# Patient Record
Sex: Male | Born: 1935 | Race: Black or African American | Hispanic: No | Smoking: Current some day smoker
Health system: Southern US, Community
[De-identification: ages and names within clinical notes are randomized; demographics above are authoritative.]

## PROBLEM LIST (undated history)

## (undated) DIAGNOSIS — I739 Peripheral vascular disease, unspecified: Secondary | ICD-10-CM

## (undated) DIAGNOSIS — I749 Embolism and thrombosis of unspecified artery: Secondary | ICD-10-CM

## (undated) DIAGNOSIS — E785 Hyperlipidemia, unspecified: Secondary | ICD-10-CM

## (undated) DIAGNOSIS — M549 Dorsalgia, unspecified: Secondary | ICD-10-CM

## (undated) DIAGNOSIS — M47812 Spondylosis without myelopathy or radiculopathy, cervical region: Secondary | ICD-10-CM

## (undated) DIAGNOSIS — K219 Gastro-esophageal reflux disease without esophagitis: Secondary | ICD-10-CM

## (undated) DIAGNOSIS — M4716 Other spondylosis with myelopathy, lumbar region: Secondary | ICD-10-CM

## (undated) DIAGNOSIS — M4712 Other spondylosis with myelopathy, cervical region: Secondary | ICD-10-CM

## (undated) DIAGNOSIS — M25549 Pain in joints of unspecified hand: Secondary | ICD-10-CM

## (undated) DIAGNOSIS — I10 Essential (primary) hypertension: Secondary | ICD-10-CM

## (undated) DIAGNOSIS — R351 Nocturia: Secondary | ICD-10-CM

## (undated) DIAGNOSIS — R7303 Prediabetes: Secondary | ICD-10-CM

## (undated) DIAGNOSIS — I639 Cerebral infarction, unspecified: Secondary | ICD-10-CM

## (undated) DIAGNOSIS — N400 Enlarged prostate without lower urinary tract symptoms: Secondary | ICD-10-CM

## (undated) DIAGNOSIS — G8929 Other chronic pain: Secondary | ICD-10-CM

## (undated) DIAGNOSIS — J40 Bronchitis, not specified as acute or chronic: Secondary | ICD-10-CM

## (undated) DIAGNOSIS — K5790 Diverticulosis of intestine, part unspecified, without perforation or abscess without bleeding: Secondary | ICD-10-CM

## (undated) DIAGNOSIS — M1990 Unspecified osteoarthritis, unspecified site: Secondary | ICD-10-CM

## (undated) DIAGNOSIS — N529 Male erectile dysfunction, unspecified: Secondary | ICD-10-CM

## (undated) DIAGNOSIS — R3915 Urgency of urination: Secondary | ICD-10-CM

## (undated) DIAGNOSIS — N183 Chronic kidney disease, stage 3 unspecified: Secondary | ICD-10-CM

## (undated) HISTORY — DX: Benign prostatic hyperplasia without lower urinary tract symptoms: N40.0

## (undated) HISTORY — DX: Cerebral infarction, unspecified: I63.9

## (undated) HISTORY — DX: Hyperlipidemia, unspecified: E78.5

## (undated) HISTORY — DX: Essential (primary) hypertension: I10

## (undated) HISTORY — DX: Prediabetes: R73.03

## (undated) HISTORY — DX: Spondylosis without myelopathy or radiculopathy, cervical region: M47.812

## (undated) HISTORY — DX: Other spondylosis with myelopathy, lumbar region: M47.16

## (undated) HISTORY — DX: Unspecified osteoarthritis, unspecified site: M19.90

## (undated) HISTORY — DX: Male erectile dysfunction, unspecified: N52.9

## (undated) HISTORY — DX: Pain in joints of unspecified hand: M25.549

---

## 1981-06-26 HISTORY — PX: SKIN GRAFT: SHX250

## 1997-10-12 ENCOUNTER — Ambulatory Visit (HOSPITAL_COMMUNITY): Admission: RE | Admit: 1997-10-12 | Discharge: 1997-10-12 | Payer: Self-pay | Admitting: Family Medicine

## 1998-01-19 ENCOUNTER — Other Ambulatory Visit: Admission: RE | Admit: 1998-01-19 | Discharge: 1998-01-19 | Payer: Self-pay | Admitting: Family Medicine

## 1998-02-26 ENCOUNTER — Ambulatory Visit (HOSPITAL_COMMUNITY): Admission: RE | Admit: 1998-02-26 | Discharge: 1998-02-26 | Payer: Self-pay | Admitting: Gastroenterology

## 1998-04-05 ENCOUNTER — Encounter: Admission: RE | Admit: 1998-04-05 | Discharge: 1998-04-05 | Payer: Self-pay | Admitting: Family Medicine

## 1998-04-05 ENCOUNTER — Ambulatory Visit (HOSPITAL_COMMUNITY): Admission: RE | Admit: 1998-04-05 | Discharge: 1998-04-05 | Payer: Self-pay | Admitting: Family Medicine

## 1999-03-07 ENCOUNTER — Ambulatory Visit (HOSPITAL_COMMUNITY): Admission: RE | Admit: 1999-03-07 | Discharge: 1999-03-07 | Payer: Self-pay | Admitting: Family Medicine

## 1999-03-07 ENCOUNTER — Encounter: Payer: Self-pay | Admitting: Family Medicine

## 2000-04-09 ENCOUNTER — Ambulatory Visit (HOSPITAL_COMMUNITY): Admission: RE | Admit: 2000-04-09 | Discharge: 2000-04-09 | Payer: Self-pay | Admitting: *Deleted

## 2000-04-27 ENCOUNTER — Ambulatory Visit (HOSPITAL_COMMUNITY): Admission: RE | Admit: 2000-04-27 | Discharge: 2000-04-27 | Payer: Self-pay | Admitting: Family Medicine

## 2000-04-27 ENCOUNTER — Encounter: Payer: Self-pay | Admitting: Family Medicine

## 2001-03-12 ENCOUNTER — Ambulatory Visit (HOSPITAL_COMMUNITY): Admission: RE | Admit: 2001-03-12 | Discharge: 2001-03-12 | Payer: Self-pay | Admitting: Family Medicine

## 2001-03-12 ENCOUNTER — Encounter: Payer: Self-pay | Admitting: Family Medicine

## 2001-07-01 ENCOUNTER — Emergency Department (HOSPITAL_COMMUNITY): Admission: EM | Admit: 2001-07-01 | Discharge: 2001-07-01 | Payer: Self-pay | Admitting: Emergency Medicine

## 2001-07-01 ENCOUNTER — Encounter: Payer: Self-pay | Admitting: Emergency Medicine

## 2002-04-02 ENCOUNTER — Encounter: Payer: Self-pay | Admitting: Internal Medicine

## 2002-04-02 ENCOUNTER — Ambulatory Visit (HOSPITAL_COMMUNITY): Admission: RE | Admit: 2002-04-02 | Discharge: 2002-04-02 | Payer: Self-pay | Admitting: Internal Medicine

## 2002-09-09 ENCOUNTER — Encounter: Payer: Self-pay | Admitting: Family Medicine

## 2002-09-09 ENCOUNTER — Encounter: Admission: RE | Admit: 2002-09-09 | Discharge: 2002-09-09 | Payer: Self-pay | Admitting: Family Medicine

## 2002-09-12 ENCOUNTER — Encounter: Payer: Self-pay | Admitting: Family Medicine

## 2002-09-12 ENCOUNTER — Ambulatory Visit (HOSPITAL_COMMUNITY): Admission: RE | Admit: 2002-09-12 | Discharge: 2002-09-12 | Payer: Self-pay | Admitting: Family Medicine

## 2002-11-19 ENCOUNTER — Ambulatory Visit (HOSPITAL_COMMUNITY): Admission: RE | Admit: 2002-11-19 | Discharge: 2002-11-19 | Payer: Self-pay | Admitting: Specialist

## 2002-11-19 ENCOUNTER — Encounter: Payer: Self-pay | Admitting: Specialist

## 2003-01-03 ENCOUNTER — Emergency Department (HOSPITAL_COMMUNITY): Admission: EM | Admit: 2003-01-03 | Discharge: 2003-01-03 | Payer: Self-pay | Admitting: Emergency Medicine

## 2003-01-03 ENCOUNTER — Encounter: Payer: Self-pay | Admitting: Emergency Medicine

## 2003-01-06 ENCOUNTER — Encounter: Payer: Self-pay | Admitting: Specialist

## 2003-01-06 ENCOUNTER — Encounter: Admission: RE | Admit: 2003-01-06 | Discharge: 2003-01-06 | Payer: Self-pay | Admitting: Specialist

## 2003-01-20 ENCOUNTER — Encounter: Payer: Self-pay | Admitting: Radiology

## 2003-01-20 ENCOUNTER — Encounter: Admission: RE | Admit: 2003-01-20 | Discharge: 2003-01-20 | Payer: Self-pay | Admitting: Specialist

## 2003-01-20 ENCOUNTER — Encounter: Payer: Self-pay | Admitting: Specialist

## 2003-02-04 ENCOUNTER — Encounter: Payer: Self-pay | Admitting: Specialist

## 2003-02-04 ENCOUNTER — Encounter: Admission: RE | Admit: 2003-02-04 | Discharge: 2003-02-04 | Payer: Self-pay | Admitting: Specialist

## 2004-03-09 ENCOUNTER — Inpatient Hospital Stay (HOSPITAL_COMMUNITY): Admission: AD | Admit: 2004-03-09 | Discharge: 2004-03-11 | Payer: Self-pay | Admitting: Infectious Diseases

## 2004-03-14 ENCOUNTER — Ambulatory Visit (HOSPITAL_COMMUNITY): Admission: RE | Admit: 2004-03-14 | Discharge: 2004-03-14 | Payer: Self-pay | Admitting: Gastroenterology

## 2004-03-14 ENCOUNTER — Encounter (INDEPENDENT_AMBULATORY_CARE_PROVIDER_SITE_OTHER): Payer: Self-pay | Admitting: *Deleted

## 2004-03-30 ENCOUNTER — Ambulatory Visit (HOSPITAL_COMMUNITY): Admission: RE | Admit: 2004-03-30 | Discharge: 2004-03-30 | Payer: Self-pay | Admitting: General Surgery

## 2004-03-31 ENCOUNTER — Ambulatory Visit (HOSPITAL_COMMUNITY): Admission: RE | Admit: 2004-03-31 | Discharge: 2004-03-31 | Payer: Self-pay | Admitting: General Surgery

## 2006-01-18 ENCOUNTER — Encounter: Admission: RE | Admit: 2006-01-18 | Discharge: 2006-02-23 | Payer: Self-pay | Admitting: Orthopaedic Surgery

## 2006-02-13 ENCOUNTER — Encounter: Admission: RE | Admit: 2006-02-13 | Discharge: 2006-02-13 | Payer: Self-pay | Admitting: Orthopaedic Surgery

## 2006-04-06 ENCOUNTER — Ambulatory Visit (HOSPITAL_BASED_OUTPATIENT_CLINIC_OR_DEPARTMENT_OTHER): Admission: RE | Admit: 2006-04-06 | Discharge: 2006-04-06 | Payer: Self-pay | Admitting: Orthopaedic Surgery

## 2006-09-23 ENCOUNTER — Emergency Department (HOSPITAL_COMMUNITY): Admission: EM | Admit: 2006-09-23 | Discharge: 2006-09-23 | Payer: Self-pay | Admitting: Emergency Medicine

## 2006-10-11 ENCOUNTER — Encounter: Admission: RE | Admit: 2006-10-11 | Discharge: 2006-10-11 | Payer: Self-pay | Admitting: Gastroenterology

## 2009-12-16 ENCOUNTER — Ambulatory Visit (HOSPITAL_COMMUNITY): Admission: RE | Admit: 2009-12-16 | Discharge: 2009-12-16 | Payer: Self-pay | Admitting: Internal Medicine

## 2009-12-16 ENCOUNTER — Encounter (INDEPENDENT_AMBULATORY_CARE_PROVIDER_SITE_OTHER): Payer: Self-pay | Admitting: Internal Medicine

## 2009-12-16 ENCOUNTER — Ambulatory Visit: Payer: Self-pay | Admitting: Surgery

## 2010-07-27 ENCOUNTER — Emergency Department (HOSPITAL_COMMUNITY): Admission: EM | Admit: 2010-07-27 | Payer: Self-pay | Source: Home / Self Care

## 2010-07-28 LAB — GLUCOSE, CAPILLARY

## 2010-11-11 NOTE — Discharge Summary (Signed)
NAME:  Bobby Vega, Bobby Vega NO.:  [id number]   MEDICAL RECORD NO.:  [id number]                   PATIENT TYPE:  INP   LOCATION:  [id number]                                 FACILITY:  [facility]   PHYSICIAN:  Aquilla Hacker, M.D.              DATE OF BIRTH:  May 17, 1935   DATE OF ADMISSION:  03/09/2004  DATE OF DISCHARGE:  03/11/2004                                 DISCHARGE SUMMARY   DISCHARGE DIAGNOSES:  1.  Abdominal pain.  2.  A 7 x 5 cm soft tissue density mass-like process involving the mid      ascending colon with pericolic stranding seen on computed tomography      scan which was completed at [place].   HISTORY OF PRESENT ILLNESS:  Mr. Look is a 76 year old gentleman who was  seen at [place] approximately 1 week ago for abdominal pain.  The patient  was treated with medication which he cannot remember, sent home, and told to  return for followup a week later.  On September 14, he returned for the  followup at [facility] and was sent to [facility] for CT scan of  the abdomen. The results of the CT scan of the abdomen revealed a 7 x 5 cm  soft tissue density mass-like process which involved the mid ascending colon  with pericolonic stranding and inflammatory changes.  Several diverticula  were also present according to the report faxed from [facility],  and these diverticula were seen in the position of the ascending colon, and  the differential diagnosis included focal diverticulitis with pericolonic  inflammatory changes versus a possible mid ascending colonic neoplasm.  They  recommended barium enema or colonoscopy for further evaluation.  The patient  was admitted into the hospital for further evaluation.  Over the course of  his hospitalization, he remained afebrile with an admitting temperature of  98.1.  Blood wise his white blood cell count remained normal throughout the  course of his  hospitalization.  He came in with an admitting white blood  cell count of 8.0, and today his white blood cell count is 7.6.  Following  his admission into the hospital he was started on IV antibiotics Zosyn 3.375  gm IV q.6h.  His abdominal pain appeared to resolve over the course of his  hospitalization.  Gastroenterology was consulted for further evaluation.  Dr. Benson Norway of gastroenterology stated that if the patient was stable from a GI  standpoint he could have a colonoscopy performed as an outpatient at 7:30  a.m. on March 14, 2004.  Because the patient states he feels better and  the patient is requesting to go home himself, the decision has been made to  discharge the patient home today.  The patient will be sent home with  GoLYTELY to take as preparation for his colonoscopy on Monday, and because  the patient has remained afebrile  over the course of his hospitalization,  his white blood cell count has been normal throughout the course of his  brief hospitalization, the decision has been made by myself not to send him  out with any p.o. antibiotics.                                                Aquilla Hacker, M.D.    OR/MEDQ  D:  03/11/2004  T:  03/11/2004  Job:  [id number]   cc:   Lucianne Lei, M.D.  [phone] [address], [address]  [place]  [place] [address]  Fax: [phone]

## 2010-11-11 NOTE — H&P (Signed)
NAME:  Bobby Vega, Bobby Vega NO.:  [id number]   MEDICAL RECORD NO.:  [id number]                   PATIENT TYPE:  INP   LOCATION:  [id number]                                 FACILITY:  [facility]   PHYSICIAN:  Aquilla Hacker, M.D.              DATE OF BIRTH:  1934/12/18   DATE OF ADMISSION:  03/09/2004  DATE OF DISCHARGE:                                HISTORY & PHYSICAL                                                Aquilla Hacker, M.D.    OR/MEDQ  D:  03/09/2004  T:  03/09/2004  Job:  [id number]

## 2010-11-11 NOTE — Op Note (Signed)
NAME:  Bobby Vega, Bobby Vega NO.:  [id number]   MEDICAL RECORD NO.:  [id number]                   PATIENT TYPE:  AMB   LOCATION:  ENDO                                 FACILITY:  [facility]   PHYSICIAN:  Tory Emerald. Benson Norway, MD                 DATE OF BIRTH:  September 05, 1934   DATE OF PROCEDURE:  03/14/2004  DATE OF DISCHARGE:                                 OPERATIVE REPORT   PROCEDURE:  Colonoscopy.   ENDOSCOPIST:  Tory Emerald. Benson Norway, M.D.   EQUIPMENT USED:  Olympus adult colonoscope.   INDICATIONS FOR PROCEDURE:  Abnormal CT scan revealing a mass in the  ascending colon.   PHYSICAL EXAMINATION:  CARDIOVASCULAR:  Regular rate and rhythm.  LUNGS:  Clear to auscultation bilaterally.  ABDOMEN:  Soft, nontender, nondistended, positive bowel sounds and mildly  obese.   MEDICATIONS:  Versed 7.5 mg IV and Demerol 75 mg IV.   CONSENT:  Informed consent was obtained from the patient describing the  risks of bleeding, infection, perforation, medication reaction, a 10% mis  rate for missing a small colon cancer or polyp and the risk of death, all of  which are not exclusive of any other complications that can occur.   PROCEDURE:  After the patient was placed in the left lateral decubitus  position and after adequate sedation was achieved, the colonoscope was  introduced from the anus into the cecum under direct visualization.  Photo  documentation was obtained of the cecal region.  Upon withdrawal there was  no evidence of any abnormalities in regards to mass or AV malformations in  the cecum.  Upon further withdrawal into the ascending colon, there was a  compressed area with possible mass type lesion.  It was difficult to observe  during this colonoscopy.  There was marked compression of the area and  adequate insufflation was not able to be achieved secondary to this mass  like region.  There was multiple medium sized diverticula noted in this  area.  Multiple biopsies  were taken of this possible mass-like lesion and  there was also a note of an ulceration.  Upon further withdrawal of the  colonoscope, the distal ascending, transverse, descending, sigmoid and  rectum were negative for any masses, ulcerations, erosions or polyps.  There  were notes of scattered diverticula in the descending and sigmoid colon.  On  retroflexion, there was documentation of moderate sized internal and  external hemorrhoids which were nonbleeding.  The preparation was noted to  be excellent.  No complications were encountered.  The patient tolerated the  procedure well.   PLAN:  1.  Follow-up on biopsies.  2.  Further follow-up pending the biopsy results.      PDH/MEDQ  D:  03/14/2004  T:  03/14/2004  Job:  [id number]   cc:   Gwenlyn Perking, M.D.  [address]  [place]  [place] [address]  Fax: [phone]   Jeryl Columbia, M.D.  [address], [place]  [place] [address]  Fax: [phone]

## 2010-11-11 NOTE — H&P (Signed)
NAME:  Bobby Vega, Bobby Vega NO.:  [id number]   MEDICAL RECORD NO.:  [id number]                   PATIENT TYPE:  INP   LOCATION:  5735                                 FACILITY:  [facility]   PHYSICIAN:  Aquilla Hacker, M.D.              DATE OF BIRTH:  05/12/1935   DATE OF ADMISSION:  03/09/2004  DATE OF DISCHARGE:                                HISTORY & PHYSICAL   CHIEF COMPLAINT:  Stomach problems.   HISTORY OF PRESENT ILLNESS:  The patient is a 76 year old male with a past  medical history of hypertension, who was seen at [facility] approximately one  week ago.  At that time, he complained of some abdominal pain.  He states  that he was examined, given some medication that he cannot remember the name  of, sent home, and told to follow up within one week.  He went back to  [facility] today complaining of abdominal pain, again was examined, and this  time referred to [facility] for further evaluation.  He was  later called at home and informed that he had an inflamed cyst on the right  side of his abdomen.  He was also told that he had blood present in his  stool.  Subsequently, Dr. Nelda Marseille called and informed me that there was a  5 x 7 soft tissue mass seen on the CT scan, which was performed today.  She  also stated that the patient was heme-positive and that he was being treated  for Trichomonas.  She requested a direct admission into the hospital here at  [facility].   Currently, the patient denies having any current abdominal pain.  He does  complain of occasional nausea.  No emesis.  No fever or chills.  No weight  changes.  No bright red blood per rectum.  No aggravating factors for his  abdominal pain, nor any alleviating factors for his abdominal pain.  His  abdominal pain is difficult to characterize.  The patient's primary care  physician is Dr. Lucianne Lei.   PAST MEDICAL HISTORY:  1.  Hypertension.  2.  Bronchitis many years  ago.  3.  The patient states that approximately 6 years ago, he was told that he      had blood present in the right side of his abdomen.  He is not very      clear about any details regarding this.   PAST SURGICAL HISTORY:  Repair of left foot bone.   ALLERGIES:  The patient states that he developed a rash to a medication in  the past but cannot recall the name of the medication.   HOME MEDICATIONS:  Cozaar 50 mg one tablet p.o. daily.   SOCIAL HISTORY:  Positive cigarettes.  Stopped approximately 6 to 7 years  ago.  Smoked approximately 3 to 4 packs per day prior to his cessation of  cigarette smoking.  Smoked for  10 to 15 years previously.  Stopped alcohol  10 years ago.  Drank approximately half a pint of alcohol per day for  approximately 10 to 20 years prior to stopping.   FAMILY HISTORY:  Mother had asthma.  Father deceased; cause of his death is  unknown.   REVIEW OF SYSTEMS:  Positive for occasional diarrhea.  No shortness of  breath.  All other symptoms as per HPI; otherwise all negative.   PHYSICAL EXAMINATION:  GENERAL:  No obvious distress.  VITAL SIGNS:  Blood pressure 147/91.  Heart rate 89.  Temperature 98.1.  SPO2 98% on room air.  HEENT:  Anicteric.  Extraocular movements intact.  NECK:  Supple.  No lymphadenopathy.  Thyroid not palpable.  CARDIAC:  S1, S2 present.  Regular rate and rhythm.  No S3 or S4.  PMI not  palpable.  No parasternal heave.  RESPIRATORY:  Lungs clear bilaterally.  No crackles.  No wheezes.  ABDOMEN:  Scaphoid.  Positive diffuse tenderness to palpation.  No guarding.  No rebound.  Good bowel sounds in all four quadrants.  Belly is soft.  No  hepatomegaly.  No splenomegaly.  EXTREMITIES:  No leg edema.  MUSCULOSKELETAL:  Strength 5/5 in upper and lower extremities.  NEURO:  Biceps and knee reflexes 2+.  The patient is alert and oriented to  person and place.  He is not clear on the month.   ASSESSMENT AND PLAN:  1.  Abdominal pain with  soft tissue mass seen on CT scan completed at      [facility] earlier today.  Etiology of mass is unknown.      Will review CT scan with radiologist.  Will consult GI and/or general      surgery after review of films.  The patient is currently afebrile and      does not currently complain of abdominal pain; however, will order blood      cultures times two.  Will order basic metabolic profile to look at all      general labs.  Will consider additional treatment after reviewing films      with radiologist.  2.  Hypertension.  Will continue the patient's home medication of Cozaar 50      mg daily.  3.  GI prophylaxis.  Will provide Protonix 40 mg daily.  4.  DVT prophylaxis.  Will start heparin 5,000 units subcutaneously q. 12h.                                                Aquilla Hacker, M.D.    OR/MEDQ  D:  03/09/2004  T:  03/09/2004  Job:  [id number]

## 2010-11-11 NOTE — Op Note (Signed)
NAME:  Bobby Vega, Bobby Vega NO.:  [id number]   MEDICAL RECORD NO.:  [id number]          PATIENT TYPE:  AMB   LOCATION:  [place]                          FACILITY:  [facility]   PHYSICIAN:  Rennis Harding, M.D.        DATE OF BIRTH:  Dec 30, 1934   DATE OF PROCEDURE:  04/06/2006  DATE OF DISCHARGE:  04/06/2006                                 OPERATIVE REPORT   DIAGNOSIS:  Lumbar spondylosis and back pain.   PROCEDURE:  1. Bilateral L4-5 and L5-S1 facette joint injections.  2. Fluoroscopic imaging used for needle placement of the above injections.   SURGEON:  Rennis Harding, MD   ASSISTANT:  None.   ANESTHESIA:  MAC plus local.   COMPLICATIONS:  None.   ESTIMATED BLOOD LOSS:  None.   INDICATIONS:  The patient is a pleasant 76 year old male with chronic  persistent back pain thought to be secondary to lumbar spondylosis.  He has  failed other treatment modalities now elects to undergo bilateral facette  injections at L4-5, L5-S1 in hopes of improving his symptoms.  Risks,  benefits, alternatives were reviewed.   PROCEDURE:  The patient was taken to the operating room after informed  consent.  He was turned prone.  All bony prominences were padded.  He  underwent sedation.  The back was prepped, draped in usual sterile fashion.  Fluoroscopy brought into the field and the facette joints at L4-5 and L5-S1  were visualized.  22 gauge Quincke spinal needles were then advanced into  the facette joints at L4-5 and L5-S1 bilaterally.  A small amount of  Omnipaque was injected confirming intra-articular spread.  Aspiration showed  no blood or CSF.  We then injected a solution of 40 mg Depo-Medrol 1 mL  preservative-free 1% lidocaine into each facette joint.  The patient was  turned supine, transferred to recovery in stable condition able to move his  upper and lower extremities.  He was neurologically intact.  I reviewed post  injection instructions with him.  He will follow-up in 2  weeks.      Rennis Harding, M.D.  Electronically Signed     MC/MEDQ  D:  04/06/2006  T:  04/09/2006  Job:  [id number]

## 2010-11-11 NOTE — Consult Note (Signed)
NAME:  Bobby Vega, Bobby Vega NO.:  [id number]   MEDICAL RECORD NO.:  [id number]                   PATIENT TYPE:  INP   LOCATION:  5735                                 FACILITY:  [facility]   PHYSICIAN:  Tory Emerald. Benson Norway, MD                 DATE OF BIRTH:  11/06/1934   DATE OF CONSULTATION:  03/11/2004  DATE OF DISCHARGE:  03/11/2004                                   CONSULTATION   ATTENDING PHYSICIAN:  Aquilla Hacker, M.D.   PRIMARY CARE PHYSICIAN:  Dr. Rosana Hoes Cloward   REASON FOR CONSULTATION:  Mass noted on abdominal CT scan.   HISTORY OF PRESENT ILLNESS:  This is a 76 year old black male with a past  medical history of hypertension and bronchitis who was admitted for an  abnormal CT scan and abdominal pain.  The patient initially complained of  intermittent abdominal pain which was right-sided in location and described  as being a sharp and dull aching that did not have any modifying or  alleviating factors.  The patient presented to an outpatient clinic for  evaluation and upon routine evaluation with laboratory values the patient  was not discerned to have any significant blood abnormalities.  However,  upon further complaints of abdominal pain, a CT scan of the abdomen was  performed and it was noted that he had a 5 x 7 soft tissue mass in his  ascending colon which correlated with the site of abdominal pain.  The  patient was then subsequently referred to [facility] for admission  and further evaluation.  The patient states that he has been doing well  aside from the abdominal pain.  He denies having any abdominal pain in the  years past.  However, he may have had some intermittent discomfort within  the past couple of months.  He denies any constipation or diarrhea.  He does  not note any kind of melena.  However, he did have hematochezia  approximately two to three years prior, but no recent episodes.  Additionally, the patient was determined  to have heme-positive stools on a  prior examination.  Otherwise, the patient states that he is doing well.  No  evidence of any dysphagia, odynophagia, nausea, vomiting, or weight loss.  There is no history of any colon cancer.   PAST MEDICAL HISTORY:  As stated above with the addition of bronchitis.   PAST SURGICAL HISTORY:  Repair of left foot bone.   ALLERGIES:  Uncertain of a medication which had caused him to have a rash.   HOME MEDICATIONS:  Cozaar 50 mg p.o. daily.   SOCIAL HISTORY:  Significant for intermittent heavy tobacco abuse and prior  heavy alcohol use approximately 10 years ago.  No history of any illicit  drug use.   FAMILY HISTORY:  Mother with asthma and there were multiple members in the  family with stroke.   REVIEW  OF SYSTEMS:  HEENT:  The patient denies any headaches, blurry vision,  or any eye pain or sinus problems.  NECK:  No complaints of any neck type of  pain.  CHEST:  No complaints of shortness of breath, wheezing, or cough.  CARDIAC:  No complaints of any chest pain or PND or orthopnea.  ABDOMEN:  As  stated above with positive findings of abdominal pain.  GENITOURINARY:  No  evidence of any dysuria or hematuria.  MUSCULOSKELETAL:  No complaints of  any muscle pains or aches or arthritis.  NEUROLOGIC:  Patient denies any  history of any seizures or gross abnormalities in regards to his neurologic  system.   PHYSICAL EXAMINATION:  VITAL SIGNS:  Blood pressure 145/91, heart rate 75,  respirations 20, temperature 98.5, pulse ox on room air 96%.  GENERAL:  The patient is in no acute distress.  Alert and oriented.  HEENT:  Normocephalic, atraumatic.  Extraocular muscles intact.  Pupils are  equal, round, and reactive to light.  NECK:  Supple.  No lymphadenopathy.  LUNGS:  Clear to auscultation bilaterally.  CARDIOVASCULAR:  Regular rate and rhythm without murmurs, rubs, or gallops.  ABDOMEN:  Soft, mildly obese, nondistended.  There is tenderness in  the  right upper and lower quadrants.  There is a palpable mass within that area  and there is pain that is elicited with the palpation of the mass.  Bowel  sounds are present.  RECTAL:  Negative for any masses on the prostate or nodular abnormality.  There is no stool in the rectal vault and there is a history of guaiac  positivity.  EXTREMITIES:  No clubbing, cyanosis, edema.  NEUROLOGIC:  Patient is grossly intact.  SKIN:  No evidence of any rashes.   LABORATORIES:  On March 11, 2004 white blood cell count is 7.6,  hemoglobin 13.4, platelets 222.  Sodium 137, potassium 4.2, chloride 105,  CO2 24, BUN 15, creatinine 1.5, glucose 92.  On March 09, 2004 AST is  16, ALT is 13, alkaline phosphatase is 124, total bilirubin is 0.4, albumin  3.4.  Blood cultures were obtained and are pending at this time.  On  March 11, 2004 PT is 14.4, INR of 1.2, and PTT is 89.   Imaging:  No official radiology report is available.  However, upon review  of the CT scan there is evidence of a large ascending colon mass.   IMPRESSION:  1.  Ascending colon mass with resultant abdominal pain.  2.  Hypertension.   Given the findings of the CT scan, it is likely that patient does have a  malignant process within the ascending colon.  He is relatively asymptomatic  aside from his abdominal pain.  No laboratory values are revealing for any  evidence of significant anemia or iron deficiency.  The patient is currently  stable and nontoxic appearing at this time.   PLAN:  1.  Perform a colonoscopy on March 14, 2004.  2.  Prepare with GoLYTELY 46 L until the diarrhea is clear to yellowish in      color.  3.  n.p.o. after midnight on March 13, 2004.  4.  Clear liquids on March 13, 2004.      PDH/MEDQ  D:  03/11/2004  T:  03/12/2004  Job:  [id number]   cc:   Gwenlyn Perking, M.D.  [address]  [place]  [place] [address]  Fax: [phone]   Aquilla Hacker, M.D.

## 2011-05-16 ENCOUNTER — Other Ambulatory Visit: Payer: Self-pay

## 2011-05-16 DIAGNOSIS — I70219 Atherosclerosis of native arteries of extremities with intermittent claudication, unspecified extremity: Secondary | ICD-10-CM

## 2011-05-26 ENCOUNTER — Encounter: Payer: Self-pay | Admitting: Surgery

## 2011-06-23 ENCOUNTER — Encounter: Payer: Self-pay | Admitting: Surgery

## 2011-06-26 ENCOUNTER — Ambulatory Visit (INDEPENDENT_AMBULATORY_CARE_PROVIDER_SITE_OTHER): Payer: PRIVATE HEALTH INSURANCE | Admitting: Surgery

## 2011-06-26 ENCOUNTER — Encounter: Payer: Self-pay | Admitting: Surgery

## 2011-06-26 ENCOUNTER — Other Ambulatory Visit (INDEPENDENT_AMBULATORY_CARE_PROVIDER_SITE_OTHER): Payer: PRIVATE HEALTH INSURANCE | Admitting: *Deleted

## 2011-06-26 VITALS — BP 188/101 | HR 63 | Resp 16 | Ht 66.0 in | Wt 189.0 lb

## 2011-06-26 DIAGNOSIS — I739 Peripheral vascular disease, unspecified: Secondary | ICD-10-CM

## 2011-06-26 NOTE — Progress Notes (Signed)
Vascular and Vein Specialist of [facility]   Patient name: Bobby Vega MRN: [id number] DOB: Feb 01, 1935 Sex: male   Referred by: Dr. Jeanie Cooks  Reason for referral:  Chief Complaint  Patient presents with  . PVD    Ref. Dr. Jeanie Cooks    HISTORY OF PRESENT ILLNESS: I think patient today in consultation for evaluation of peripheral vascular disease. The patient states that he has been having problems for the past 2 years but they have gotten significantly worse. His biggest complaint is that of the right leg. He states that he can walk approximately 20 feet before he goes dead. He also complains of waking up 5 times at night. He has to patent his leg and hang it over this as of the bed for C. difficile healing back and it. The patient denies any openings, active ulceration or nonhealing wound.  The patient suffers from hypertension and hyperlipidemia both of which are medically managed. He also has borderline diabetes. He is a nonsmoker.     Past Medical History  Diagnosis Date  . Hypertension   . Hyperlipidemia   . BPH (benign prostatic hyperplasia)   . ED (erectile dysfunction)   . Constipation   . Arthritis     hip  . Borderline diabetes   . Pain in joint, hand   . Lumbar spondylosis with myelopathy   . Cervical spondylosis without myelopathy     History reviewed. No pertinent past surgical history.  History   Social History  . Marital Status: Widowed    Spouse Name: N/A    Number of Children: N/A  . Years of Education: N/A   Occupational History  . Not on file.   Social History Main Topics  . Smoking status: Never Smoker   . Smokeless tobacco: Not on file  . Alcohol Use: Not on file  . Drug Use:   . Sexually Active:    Other Topics Concern  . Not on file   Social History Narrative  . No narrative on file    History reviewed. No pertinent family history.  Allergies as of 06/26/2011  . (No Known Allergies)    Current Outpatient Prescriptions on File  Prior to Visit  Medication Sig Dispense Refill  . albuterol (PROVENTIL HFA;VENTOLIN HFA) 108 (90 BASE) MCG/ACT inhaler Inhale 2 puffs into the lungs 4 (four) times daily as needed.        Marland Kitchen aspirin EC 81 MG tablet Take 81 mg by mouth daily.        Marland Kitchen dutasteride (AVODART) 0.5 MG capsule Take 0.5 mg by mouth daily.        Marland Kitchen esomeprazole (NEXIUM) 40 MG capsule Take 40 mg by mouth daily before breakfast.        . loratadine (CLARITIN) 10 MG tablet Take 10 mg by mouth daily.        Marland Kitchen losartan (COZAAR) 100 MG tablet Take 50 mg by mouth daily.        . pregabalin (LYRICA) 75 MG capsule Take 75 mg by mouth 3 (three) times daily.        . sildenafil (VIAGRA) 100 MG tablet Take 100 mg by mouth daily as needed.        . simvastatin (ZOCOR) 20 MG tablet Take 20 mg by mouth at bedtime.        . tadalafil (CIALIS) 20 MG tablet Take 20 mg by mouth daily as needed.        . vitamin B-12 (CYANOCOBALAMIN) 250 MCG tablet  Take 250 mcg by mouth daily.           REVIEW OF SYSTEMS: Cardiovascular: No chest pain, chest pressure, palpitations, orthopnea, or dyspnea on exertion. No claudication or rest pain,  No history of DVT or phlebitis. Pulmonary: No productive cough, asthma or wheezing. Neurologic: No weakness, paresthesias, aphasia, or amaurosis. No dizziness. Hematologic: No bleeding problems or clotting disorders. Musculoskeletal: No joint pain or joint swelling. Gastrointestinal: No blood in stool or hematemesis Genitourinary: No dysuria or hematuria. Psychiatric:: No history of major depression. Integumentary: No rashes or ulcers. Constitutional: No fever or chills.  PHYSICAL EXAMINATION: General: The patient appears their stated age.  Vital signs are BP 188/101  Pulse 63  Resp 16  Ht 5\' 6"  (1.676 m)  Wt 189 lb (85.73 kg)  BMI 30.51 kg/m2  SpO2 97% HEENT:  No gross abnormalities Pulmonary: Respirations are non-labored Abdomen: Soft and non-tender  Musculoskeletal: There are no major  deformities.   Neurologic: No focal weakness or paresthesias are detected, Skin: There are no ulcer or rashes noted. Psychiatric: The patient has normal affect. Cardiovascular: There is a regular rate and rhythm without significant murmur appreciated. no carotid bruits palpable left femoral pulse, nonpalpable right femoral pulse   Diagnostic Studies:  right ABI is 0.2 left ABI is 0.58 duplex suggested an occluded right superficial femoral artery and stenosis within the left superficial femoral artery   Outside Studies/Documentation   Medication Changes:  none  Assessment:   severe peripheral vascular disease, right greater than left Plan:  the patient describes what I believe to be rest pain. We discussed the ultrasound results. I believe the next is to proceed with angiogram. L. plan on accessing his left groin and studying both legs. Will intervene on the right leg if possible. I suspect he has a right iliac occlusion. We discussed proceeding with intervention if possible although he may require surgical revascularization. I have scheduled his procedure for Tuesday, January 8     V. Leia Alf, M.D. [facility] of [place] Office: [phone] Pager:  [phone]

## 2011-06-29 ENCOUNTER — Other Ambulatory Visit: Payer: Self-pay

## 2011-07-04 ENCOUNTER — Ambulatory Visit (HOSPITAL_COMMUNITY)
Admission: RE | Admit: 2011-07-04 | Discharge: 2011-07-04 | Disposition: A | Discharge status: Home / Self Care | Payer: PRIVATE HEALTH INSURANCE | Source: Ambulatory Visit | Attending: Surgery | Admitting: Surgery

## 2011-07-04 ENCOUNTER — Other Ambulatory Visit: Payer: Self-pay

## 2011-07-04 ENCOUNTER — Ambulatory Visit (HOSPITAL_COMMUNITY)
Admission: RE | Disposition: A | Discharge status: Home / Self Care | Payer: Self-pay | Source: Ambulatory Visit | Attending: Surgery

## 2011-07-04 DIAGNOSIS — E785 Hyperlipidemia, unspecified: Secondary | ICD-10-CM | POA: Insufficient documentation

## 2011-07-04 DIAGNOSIS — I70229 Atherosclerosis of native arteries of extremities with rest pain, unspecified extremity: Secondary | ICD-10-CM

## 2011-07-04 DIAGNOSIS — I10 Essential (primary) hypertension: Secondary | ICD-10-CM | POA: Insufficient documentation

## 2011-07-04 DIAGNOSIS — I70219 Atherosclerosis of native arteries of extremities with intermittent claudication, unspecified extremity: Secondary | ICD-10-CM | POA: Insufficient documentation

## 2011-07-04 DIAGNOSIS — R7309 Other abnormal glucose: Secondary | ICD-10-CM | POA: Insufficient documentation

## 2011-07-04 HISTORY — PX: ABDOMINAL AORTAGRAM: SHX5454

## 2011-07-04 HISTORY — PX: LOWER EXTREMITY ANGIOGRAM: SHX5508

## 2011-07-04 SURGERY — ABDOMINAL AORTAGRAM
Anesthesia: LOCAL

## 2011-07-04 MED ORDER — HYDRALAZINE HCL 20 MG/ML IJ SOLN
10.0000 mg | INTRAMUSCULAR | Status: DC | PRN
  Administered 2011-07-04: 10 mg via INTRAVENOUS

## 2011-07-04 MED ORDER — LABETALOL HCL 5 MG/ML IV SOLN: 10.0000 mg | INTRAVENOUS | Status: DC | PRN

## 2011-07-04 MED ORDER — SODIUM BICARBONATE 8.4 % IV SOLN
INTRAVENOUS | Status: DC
  Filled 2011-07-04: qty 1000

## 2011-07-04 MED ORDER — MORPHINE SULFATE 2 MG/ML IJ SOLN: 2.0000 mg | INTRAMUSCULAR | Status: DC | PRN

## 2011-07-04 MED ORDER — ACETAMINOPHEN 325 MG PO TABS: 325.0000 mg | ORAL_TABLET | ORAL | Status: DC | PRN

## 2011-07-04 MED ORDER — GUAIFENESIN-DM 100-10 MG/5ML PO SYRP: 15.0000 mL | ORAL_SOLUTION | ORAL | Status: DC | PRN

## 2011-07-04 MED ORDER — OXYCODONE-ACETAMINOPHEN 5-325 MG PO TABS: 1.0000 | ORAL_TABLET | ORAL | Status: DC | PRN

## 2011-07-04 MED ORDER — SODIUM CHLORIDE 0.9 % IV SOLN
INTRAVENOUS | Status: DC
  Administered 2011-07-04: 08:00:00 via INTRAVENOUS

## 2011-07-04 MED ORDER — PHENOL 1.4 % MT LIQD: 1.0000 | OROMUCOSAL | Status: DC | PRN

## 2011-07-04 MED ORDER — METOPROLOL TARTRATE 1 MG/ML IV SOLN: 2.0000 mg | INTRAVENOUS | Status: DC | PRN

## 2011-07-04 MED ORDER — ACETAMINOPHEN 325 MG RE SUPP: 325.0000 mg | RECTAL | Status: DC | PRN

## 2011-07-04 MED ORDER — DEXTROSE 5 % IV SOLN
INTRAVENOUS | Status: AC
  Administered 2011-07-04: 11:00:00 via INTRAVENOUS
  Filled 2011-07-04: qty 1000

## 2011-07-04 MED ORDER — ONDANSETRON HCL 4 MG/2ML IJ SOLN: 4.0000 mg | Freq: Four times a day (QID) | INTRAMUSCULAR | Status: DC | PRN

## 2011-07-04 NOTE — Progress Notes (Signed)
Monitoring: Bicarb for CIN & Renal adjustment of antibiotics  Pharmacy Problem List/Assessment:  Anticoagulation: SCDs only Infectious disease: Afebrile, no labs, no antibiotics Card: BP elevated 166/76, HR 68, prn lopressor; also prn hydralazine or labetaolol- f/u duplicate beta blockers Renal: Bicarb for CIN this am-s/p [place] of left femoral artery & aortogram, found to have occulsions in right iliac, right femoral and left femoral. Plan for aortofemoroal vs fem-fem bypass graft. Dr. Trula Slade order bicarb per pharmacy post procedure--duplicate.    Bicarb to continue 6 hours post-procedure as ordered. No changes.  If need further Bicarb for CIN prior to next procedure please re-order.  Thanks, Sloan Leiter, PharmD 07/04/2011, 4:33 PM.

## 2011-07-04 NOTE — H&P (View-Only) (Signed)
Vascular and Vein Specialist of [facility]   Patient name: Bobby Vega MRN: [id number] DOB: Apr 26, 1935 Sex: male   Referred by: Dr. Jeanie Cooks  Reason for referral:  Chief Complaint  Patient presents with  . PVD    Ref. Dr. Jeanie Cooks    HISTORY OF PRESENT ILLNESS: I think patient today in consultation for evaluation of peripheral vascular disease. The patient states that he has been having problems for the past 2 years but they have gotten significantly worse. His biggest complaint is that of the right leg. He states that he can walk approximately 20 feet before he goes dead. He also complains of waking up 5 times at night. He has to patent his leg and hang it over this as of the bed for C. difficile healing back and it. The patient denies any openings, active ulceration or nonhealing wound.  The patient suffers from hypertension and hyperlipidemia both of which are medically managed. He also has borderline diabetes. He is a nonsmoker.     Past Medical History  Diagnosis Date  . Hypertension   . Hyperlipidemia   . BPH (benign prostatic hyperplasia)   . ED (erectile dysfunction)   . Constipation   . Arthritis     hip  . Borderline diabetes   . Pain in joint, hand   . Lumbar spondylosis with myelopathy   . Cervical spondylosis without myelopathy     History reviewed. No pertinent past surgical history.  History   Social History  . Marital Status: Widowed    Spouse Name: N/A    Number of Children: N/A  . Years of Education: N/A   Occupational History  . Not on file.   Social History Main Topics  . Smoking status: Never Smoker   . Smokeless tobacco: Not on file  . Alcohol Use: Not on file  . Drug Use:   . Sexually Active:    Other Topics Concern  . Not on file   Social History Narrative  . No narrative on file    History reviewed. No pertinent family history.  Allergies as of 06/26/2011  . (No Known Allergies)    Current Outpatient Prescriptions on File  Prior to Visit  Medication Sig Dispense Refill  . albuterol (PROVENTIL HFA;VENTOLIN HFA) 108 (90 BASE) MCG/ACT inhaler Inhale 2 puffs into the lungs 4 (four) times daily as needed.        Marland Kitchen aspirin EC 81 MG tablet Take 81 mg by mouth daily.        Marland Kitchen dutasteride (AVODART) 0.5 MG capsule Take 0.5 mg by mouth daily.        Marland Kitchen esomeprazole (NEXIUM) 40 MG capsule Take 40 mg by mouth daily before breakfast.        . loratadine (CLARITIN) 10 MG tablet Take 10 mg by mouth daily.        Marland Kitchen losartan (COZAAR) 100 MG tablet Take 50 mg by mouth daily.        . pregabalin (LYRICA) 75 MG capsule Take 75 mg by mouth 3 (three) times daily.        . sildenafil (VIAGRA) 100 MG tablet Take 100 mg by mouth daily as needed.        . simvastatin (ZOCOR) 20 MG tablet Take 20 mg by mouth at bedtime.        . tadalafil (CIALIS) 20 MG tablet Take 20 mg by mouth daily as needed.        . vitamin B-12 (CYANOCOBALAMIN) 250 MCG tablet  Take 250 mcg by mouth daily.           REVIEW OF SYSTEMS: Cardiovascular: No chest pain, chest pressure, palpitations, orthopnea, or dyspnea on exertion. No claudication or rest pain,  No history of DVT or phlebitis. Pulmonary: No productive cough, asthma or wheezing. Neurologic: No weakness, paresthesias, aphasia, or amaurosis. No dizziness. Hematologic: No bleeding problems or clotting disorders. Musculoskeletal: No joint pain or joint swelling. Gastrointestinal: No blood in stool or hematemesis Genitourinary: No dysuria or hematuria. Psychiatric:: No history of major depression. Integumentary: No rashes or ulcers. Constitutional: No fever or chills.  PHYSICAL EXAMINATION: General: The patient appears their stated age.  Vital signs are BP 188/101  Pulse 63  Resp 16  Ht 5\' 6"  (1.676 m)  Wt 189 lb (85.73 kg)  BMI 30.51 kg/m2  SpO2 97% HEENT:  No gross abnormalities Pulmonary: Respirations are non-labored Abdomen: Soft and non-tender  Musculoskeletal: There are no major  deformities.   Neurologic: No focal weakness or paresthesias are detected, Skin: There are no ulcer or rashes noted. Psychiatric: The patient has normal affect. Cardiovascular: There is a regular rate and rhythm without significant murmur appreciated. no carotid bruits palpable left femoral pulse, nonpalpable right femoral pulse   Diagnostic Studies:  right ABI is 0.2 left ABI is 0.58 duplex suggested an occluded right superficial femoral artery and stenosis within the left superficial femoral artery   Outside Studies/Documentation   Medication Changes:  none  Assessment:   severe peripheral vascular disease, right greater than left Plan:  the patient describes what I believe to be rest pain. We discussed the ultrasound results. I believe the next is to proceed with angiogram. L. plan on accessing his left groin and studying both legs. Will intervene on the right leg if possible. I suspect he has a right iliac occlusion. We discussed proceeding with intervention if possible although he may require surgical revascularization. I have scheduled his procedure for Tuesday, January 8     V. Leia Alf, M.D. V[facility] Vein Specialists of [place] Office: [phone] Pager:  [phone]

## 2011-07-04 NOTE — Op Note (Signed)
Vascular and Vein Specialists of [place]  Patient name: Johnnell Micheau MRN: [id number] DOB: 17-Sep-1934 Sex: male  07/04/2011 Pre-operative Diagnosis: Right leg claudication Post-operative diagnosis:  Same Surgeon:  Eldridge Abrahams Procedure Performed:  1.  ultrasound access left femoral artery  2.  abdominal aortogram  3.  bilateral lower extremity  runoff    Indications:  The patient was recently seen in the office with right leg vascular insufficiency, bordering on rest pain. Doppler showed severe disease in the right leg as well as the left leg. He comes in today for diagnostic study and possible intervention  Procedure:  The patient was identified in the holding area and taken to room 8.  The patient was then placed supine on the table and prepped and draped in the usual sterile fashion.  A time out was called.  Ultrasound was used to evaluate the left common femoral artery.  It was patent .  A digital ultrasound image was acquired. The left common femoral artery was accessed under ultrasound guidance using an 18-gauge needle. An 035 wire was advanced into the aorta under fluoroscopic visualization. A 5 French sheath was placed. Over the wire an omni-flush catheter was advanced to the level LI and an abdominal aortogram was performed. Next the catheter was pulled down to the aortic bifurcation and bilateral lower sternum a runoff was performed. Pressure measurements were obtained in the aorta and in the left groin.  Findings:   Aortogram:  The visualized portions of the suprarenal abdominal aorta showed no significant disease. There are single renal arteries bilaterally which are widely patent. The infrarenal abdominal aorta is patent throughout it's course.  Pelvic Angiogram:  Bilateral common iliac arteries are patent without significant stenosis. Bilateral hypogastric arteries are patent. The left external iliac artery is irregular but patent. The right external iliac artery is  occluded  Right Lower Extremity:  There is reconstitution of the right common femoral artery. The right superficial femoral artery is occluded. There are profunda branches seen in the upper thigh which reconstituted a popliteal artery which is diseased. There is limited evaluation of the right leg secondary to proximal disease in the lack of contrast administered given the patient's renal function  Left Lower Extremity:  The left common femoral artery is patent throughout its course the profunda femoral artery is patent. The superficial femoral artery is occluded with popliteal reconstitution. There is limited evaluation of the tibial vessels secondary to proximal disease as well as lack of contrast due to the patient's renal function.   Intervention:   none   Impression:  #1   right external iliac occlusion  #2  right superficial femoral occlusion  #3  left superficial femoral occlusion  #4  the patient is not a candidate for percutaneous revascularization. He will be evaluated for aortofemoral versus femoral-femoral bypass graft     V. Annamarie Major, M.D. V[facility] Vein Specialists of [place] Office: [phone] Pager:  [phone]

## 2011-07-04 NOTE — Interval H&P Note (Signed)
History and Physical Interval Note:  07/04/2011 12:48 PM  Bobby Vega  has presented today for surgery, with the diagnosis of PVD  The various methods of treatment have been discussed with the patient and family. After consideration of risks, benefits and other options for treatment, the patient has consented to  Procedure(s): ABDOMINAL AORTAGRAM as a surgical intervention .  The patients' history has been reviewed, patient examined, no change in status, stable for surgery.  I have reviewed the patients' chart and labs.  Questions were answered to the patient's satisfaction.     BRABHAM IV, V. WELLS

## 2011-07-04 NOTE — Progress Notes (Signed)
Attempted two PIV, both #22g, one in right wrist and the left antecubital.  No access. Another RN to assess.

## 2011-07-04 NOTE — Progress Notes (Signed)
Bicarb drip post procedure at 100cc/hr x 6hrs 600cc total

## 2011-07-05 ENCOUNTER — Other Ambulatory Visit: Payer: Self-pay | Admitting: *Deleted

## 2011-07-05 DIAGNOSIS — Z01818 Encounter for other preprocedural examination: Secondary | ICD-10-CM

## 2011-07-05 MED FILL — Fentanyl Citrate Inj 0.05 MG/ML: INTRAMUSCULAR | Qty: 2 | Status: AC

## 2011-07-05 MED FILL — Hydralazine HCl Inj 20 MG/ML: INTRAMUSCULAR | Qty: 1 | Status: AC

## 2011-07-05 MED FILL — Lidocaine HCl Local Preservative Free (PF) Inj 1%: INTRAMUSCULAR | Qty: 30 | Status: AC

## 2011-07-05 MED FILL — Heparin Sodium (Porcine) 2 Unit/ML in Sodium Chloride 0.9%: INTRAMUSCULAR | Qty: 1000 | Status: AC

## 2011-07-05 MED FILL — Midazolam HCl Inj 2 MG/2ML (Base Equivalent): INTRAMUSCULAR | Qty: 2 | Status: AC

## 2011-07-06 LAB — POCT I-STAT, CHEM 8
BUN: 23 mg/dL (ref 6–23)
Calcium, Ion: 1.21 mmol/L (ref 1.12–1.32)
Chloride: 112 mEq/L (ref 96–112)
Creatinine, Ser: 1.8 mg/dL — ABNORMAL HIGH (ref 0.50–1.35)
Glucose, Bld: 87 mg/dL (ref 70–99)
Potassium: 4.4 mEq/L (ref 3.5–5.1)

## 2011-07-07 ENCOUNTER — Encounter: Payer: Self-pay | Admitting: Surgery

## 2011-07-10 ENCOUNTER — Encounter: Payer: Self-pay | Admitting: *Deleted

## 2011-07-10 ENCOUNTER — Ambulatory Visit (INDEPENDENT_AMBULATORY_CARE_PROVIDER_SITE_OTHER): Payer: PRIVATE HEALTH INSURANCE | Admitting: Surgery

## 2011-07-10 ENCOUNTER — Other Ambulatory Visit (INDEPENDENT_AMBULATORY_CARE_PROVIDER_SITE_OTHER): Payer: PRIVATE HEALTH INSURANCE | Admitting: *Deleted

## 2011-07-10 ENCOUNTER — Encounter: Payer: Self-pay | Admitting: Surgery

## 2011-07-10 ENCOUNTER — Other Ambulatory Visit: Payer: Self-pay | Admitting: *Deleted

## 2011-07-10 VITALS — BP 183/99 | HR 58 | Resp 16 | Ht 65.0 in | Wt 190.0 lb

## 2011-07-10 DIAGNOSIS — Z01818 Encounter for other preprocedural examination: Secondary | ICD-10-CM

## 2011-07-10 DIAGNOSIS — Z01810 Encounter for preprocedural cardiovascular examination: Secondary | ICD-10-CM

## 2011-07-10 DIAGNOSIS — I70219 Atherosclerosis of native arteries of extremities with intermittent claudication, unspecified extremity: Secondary | ICD-10-CM | POA: Insufficient documentation

## 2011-07-10 DIAGNOSIS — I6529 Occlusion and stenosis of unspecified carotid artery: Secondary | ICD-10-CM

## 2011-07-10 DIAGNOSIS — I739 Peripheral vascular disease, unspecified: Secondary | ICD-10-CM

## 2011-07-10 NOTE — Progress Notes (Signed)
Vascular and Vein Specialist of [place]   Patient name: Bobby Vega MRN: [id number] DOB: May 09, 1935 Sex: male     Chief Complaint  Patient presents with  . Carotid    new evaluation  . PVD    bilateral pain in lower legs    HISTORY OF PRESENT ILLNESS: The patient is here today for further discussions regarding his vascular insufficiency. The patient has severe symptoms in his right leg. He can walk approximately 20 feet before he does dead. This is been going on for 2 years but has gotten significantly worse. He also wakes up approximately 5 times at night to hang his leg over the side of the bed. 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 He was not a candidate for endovascular repair he comes in to discuss the surgical options.  The patient's medically managed for his hypertension hyperlipidemia he's had no acute changes. He has borderline diabetes.  Past Medical History  Diagnosis Date  . Hypertension   . Hyperlipidemia   . BPH (benign prostatic hyperplasia)   . ED (erectile dysfunction)   . Constipation   . Arthritis     hip  . Borderline diabetes   . Pain in joint, hand   . Lumbar spondylosis with myelopathy   . Cervical spondylosis without myelopathy     History reviewed. No pertinent past surgical history.  History   Social History  . Marital Status: Widowed    Spouse Name: N/A    Number of Children: N/A  . Years of Education: N/A   Occupational History  . Not on file.   Social History Main Topics  . Smoking status: Never Smoker   . Smokeless tobacco: Not on file  . Alcohol Use: Not on file  . Drug Use:   . Sexually Active:    Other Topics Concern  . Not on file   Social History Narrative  . No narrative on file    History reviewed. No pertinent family history.  Allergies as of 07/10/2011  . (No Known Allergies)    Current Outpatient Prescriptions on File  Prior to Visit  Medication Sig Dispense Refill  . albuterol (PROVENTIL HFA;VENTOLIN HFA) 108 (90 BASE) MCG/ACT inhaler Inhale 2 puffs into the lungs 4 (four) times daily as needed.        Marland Kitchen aspirin EC 81 MG tablet Take 81 mg by mouth daily.        Marland Kitchen dutasteride (AVODART) 0.5 MG capsule Take 0.5 mg by mouth daily.        Marland Kitchen esomeprazole (NEXIUM) 40 MG capsule Take 40 mg by mouth daily before breakfast.        . loratadine (CLARITIN) 10 MG tablet Take 10 mg by mouth daily.        Marland Kitchen losartan (COZAAR) 100 MG tablet Take 50 mg by mouth daily.        . naproxen (NAPROSYN) 500 MG tablet Take 500 mg by mouth 2 (two) times daily with a meal.        . pregabalin (LYRICA) 75 MG capsule Take 75 mg by mouth 3 (three) times daily.        . sildenafil (VIAGRA) 100 MG tablet Take 100 mg by mouth daily as needed.        . simvastatin (ZOCOR) 20 MG tablet Take 20 mg by mouth at bedtime.        . tadalafil (CIALIS) 20 MG tablet Take 20 mg  by mouth daily as needed.        . vitamin B-12 (CYANOCOBALAMIN) 250 MCG tablet Take 250 mcg by mouth daily.           REVIEW OF SYSTEMS: No changes from prior visit  PHYSICAL EXAMINATION:   Vital signs are BP 183/99  Pulse 58  Resp 16  Ht 5\' 5"  (1.651 m)  Wt 190 lb (86.183 kg)  BMI 31.62 kg/m2  SpO2 98% General: The patient appears their stated age. HEENT:  No gross abnormalities Pulmonary:  Non labored breathing Abdomen: Soft and non-tender no surgical incisions Musculoskeletal: There are no major deformities. Neurologic: No focal weakness or paresthesias are detected, Skin: There are no ulcer or rashes noted. Psychiatric: The patient has normal affect. Cardiovascular: There is a regular rate and rhythm without significant murmur appreciated.   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 I discussed that based on his rest pain he is in a near limb threatening situation and I would recommend proceeding with revascularization. I feel that his best option to reestablish blood flow to his legs is an aortobifemoral bypass graft. We discussed the risks and benefits including the risk of bleeding risk of cardiopulmonary complications and the risk of infection and the risk of intestinal complications. All her questions were answered today I have scheduled his operation for this Friday, January 18. He has a cardiac evaluation pending  V. Leia Alf, M.D. [facility] of [place] Office: [phone] Pager:  [phone]

## 2011-07-11 ENCOUNTER — Encounter (HOSPITAL_COMMUNITY): Payer: Self-pay | Admitting: Pharmacy Technician

## 2011-07-12 ENCOUNTER — Encounter (HOSPITAL_COMMUNITY)
Admission: RE | Admit: 2011-07-12 | Discharge: 2011-07-12 | Disposition: A | Discharge status: Home / Self Care | Payer: PRIVATE HEALTH INSURANCE | Source: Ambulatory Visit | Attending: Surgery | Admitting: Surgery

## 2011-07-12 ENCOUNTER — Encounter (HOSPITAL_COMMUNITY): Payer: Self-pay

## 2011-07-12 HISTORY — DX: Gastro-esophageal reflux disease without esophagitis: K21.9

## 2011-07-12 HISTORY — DX: Embolism and thrombosis of unspecified artery: I74.9

## 2011-07-12 HISTORY — DX: Dorsalgia, unspecified: M54.9

## 2011-07-12 HISTORY — DX: Peripheral vascular disease, unspecified: I73.9

## 2011-07-12 HISTORY — DX: Other chronic pain: G89.29

## 2011-07-12 HISTORY — DX: Diverticulosis of intestine, part unspecified, without perforation or abscess without bleeding: K57.90

## 2011-07-12 HISTORY — DX: Bronchitis, not specified as acute or chronic: J40

## 2011-07-12 HISTORY — DX: Urgency of urination: R39.15

## 2011-07-12 HISTORY — DX: Nocturia: R35.1

## 2011-07-12 HISTORY — DX: Other spondylosis with myelopathy, cervical region: M47.12

## 2011-07-12 LAB — DIFFERENTIAL
Lymphocytes Relative: 41 % (ref 12–46)
Lymphs Abs: 3 10*3/uL (ref 0.7–4.0)
Neutro Abs: 3.5 10*3/uL (ref 1.7–7.7)
Neutrophils Relative %: 47 % (ref 43–77)

## 2011-07-12 LAB — URINE MICROSCOPIC-ADD ON

## 2011-07-12 LAB — BLOOD GAS, ARTERIAL
Bicarbonate: 21 mEq/L (ref 20.0–24.0)
Patient temperature: 98.6
TCO2: 22.2 mmol/L (ref 0–100)
pH, Arterial: 7.352 (ref 7.350–7.450)
pO2, Arterial: 92.5 mmHg (ref 80.0–100.0)

## 2011-07-12 LAB — TYPE AND SCREEN
ABO/RH(D): O POS
Antibody Screen: NEGATIVE
Unit division: 0
Unit division: 0

## 2011-07-12 LAB — URINALYSIS, ROUTINE W REFLEX MICROSCOPIC
Bilirubin Urine: NEGATIVE
Hgb urine dipstick: NEGATIVE
Ketones, ur: NEGATIVE mg/dL
Nitrite: NEGATIVE
pH: 5.5 (ref 5.0–8.0)

## 2011-07-12 LAB — PROTIME-INR: Prothrombin Time: 14.7 seconds (ref 11.6–15.2)

## 2011-07-12 LAB — COMPREHENSIVE METABOLIC PANEL
ALT: 7 U/L (ref 0–53)
BUN: 22 mg/dL (ref 6–23)
Calcium: 9 mg/dL (ref 8.4–10.5)
GFR calc Af Amer: 37 mL/min — ABNORMAL LOW (ref >90)
Glucose, Bld: 101 mg/dL — ABNORMAL HIGH (ref 70–99)
Sodium: 139 mEq/L (ref 135–145)
Total Protein: 7 g/dL (ref 6.0–8.3)

## 2011-07-12 LAB — CBC
Hemoglobin: 11.5 g/dL — ABNORMAL LOW (ref 13.0–17.0)
MCH: 31.4 pg (ref 26.0–34.0)
MCHC: 32 g/dL (ref 30.0–36.0)

## 2011-07-12 LAB — SURGICAL PCR SCREEN: Staphylococcus aureus: POSITIVE — AB

## 2011-07-12 MED ORDER — DEXTROSE 5 % IV SOLN
1.5000 g | INTRAVENOUS | Status: AC
  Administered 2011-07-13: 1.5 g via INTRAVENOUS
  Filled 2011-07-12: qty 1.5

## 2011-07-12 NOTE — Pre-Procedure Instructions (Signed)
Heeia  07/12/2011   Your procedure is scheduled on:  Fri, Jan 18 @ 0730  Report to [place] at [place].  Call this number if you have problems the morning of surgery: [phone]   Remember:   Do not eat food:After Midnight.  May have clear liquids: up to 4 Hours before arrival.(until 1:30 am)  Clear liquids include soda, tea, black coffee, apple or grape juice, broth.  Take these medicines the morning of surgery with A SIP OF WATER: Albuterol>>bring your inhaler with you<<,Avodart,Nexium,Claritin,Lyrica   Do not wear jewelry, make-up or nail polish.  Do not wear lotions, powders, or perfumes. You may wear deodorant.  Do not shave 48 hours prior to surgery.  Do not bring valuables to the hospital.  Contacts, dentures or bridgework may not be worn into surgery.  Leave suitcase in the car. After surgery it may be brought to your room.  For patients admitted to the hospital, checkout time is 11:00 AM the day of discharge.   Patients discharged the day of surgery will not be allowed to drive home.  Name and phone number of your driver:   Special Instructions: CHG Shower Use Special Wash: 1/2 bottle night before surgery and 1/2 bottle morning of surgery.   Please read over the following fact sheets that you were given: Pain Booklet, Coughing and Deep Breathing, Blood Transfusion Information, MRSA Information and Surgical Site Infection Prevention

## 2011-07-12 NOTE — Progress Notes (Signed)
Pt doesn't have a cardiologist;maintained by medical md for HTN  Peck

## 2011-07-12 NOTE — Progress Notes (Signed)
Stress test done at [facility] on 07/07/11-to request

## 2011-07-13 ENCOUNTER — Encounter (HOSPITAL_COMMUNITY): Payer: Self-pay | Admitting: Anesthesiology

## 2011-07-13 ENCOUNTER — Ambulatory Visit (HOSPITAL_COMMUNITY): Payer: PRIVATE HEALTH INSURANCE

## 2011-07-13 ENCOUNTER — Encounter (HOSPITAL_COMMUNITY)
Admission: RE | Disposition: A | Discharge status: Home / Self Care | Payer: Self-pay | Source: Ambulatory Visit | Attending: Surgery

## 2011-07-13 ENCOUNTER — Other Ambulatory Visit: Payer: Self-pay

## 2011-07-13 ENCOUNTER — Ambulatory Visit (HOSPITAL_COMMUNITY): Payer: PRIVATE HEALTH INSURANCE | Admitting: Anesthesiology

## 2011-07-13 ENCOUNTER — Inpatient Hospital Stay (HOSPITAL_COMMUNITY)
Admission: RE | Admit: 2011-07-13 | Discharge: 2011-07-25 | DRG: 238 | Disposition: A | Discharge status: Home / Self Care | Payer: PRIVATE HEALTH INSURANCE | Source: Ambulatory Visit | Attending: Surgery | Admitting: Surgery

## 2011-07-13 ENCOUNTER — Encounter (HOSPITAL_COMMUNITY): Payer: Self-pay | Admitting: *Deleted

## 2011-07-13 DIAGNOSIS — K922 Gastrointestinal hemorrhage, unspecified: Secondary | ICD-10-CM | POA: Diagnosis not present

## 2011-07-13 DIAGNOSIS — E876 Hypokalemia: Secondary | ICD-10-CM | POA: Diagnosis not present

## 2011-07-13 DIAGNOSIS — E785 Hyperlipidemia, unspecified: Secondary | ICD-10-CM | POA: Diagnosis present

## 2011-07-13 DIAGNOSIS — I70229 Atherosclerosis of native arteries of extremities with rest pain, unspecified extremity: Secondary | ICD-10-CM

## 2011-07-13 DIAGNOSIS — K219 Gastro-esophageal reflux disease without esophagitis: Secondary | ICD-10-CM | POA: Diagnosis present

## 2011-07-13 DIAGNOSIS — N400 Enlarged prostate without lower urinary tract symptoms: Secondary | ICD-10-CM | POA: Diagnosis present

## 2011-07-13 DIAGNOSIS — K5900 Constipation, unspecified: Secondary | ICD-10-CM | POA: Diagnosis present

## 2011-07-13 DIAGNOSIS — N189 Chronic kidney disease, unspecified: Secondary | ICD-10-CM | POA: Diagnosis present

## 2011-07-13 DIAGNOSIS — K929 Disease of digestive system, unspecified: Secondary | ICD-10-CM | POA: Diagnosis not present

## 2011-07-13 DIAGNOSIS — Z01812 Encounter for preprocedural laboratory examination: Secondary | ICD-10-CM

## 2011-07-13 DIAGNOSIS — I129 Hypertensive chronic kidney disease with stage 1 through stage 4 chronic kidney disease, or unspecified chronic kidney disease: Secondary | ICD-10-CM | POA: Diagnosis present

## 2011-07-13 DIAGNOSIS — I7409 Other arterial embolism and thrombosis of abdominal aorta: Secondary | ICD-10-CM | POA: Diagnosis present

## 2011-07-13 DIAGNOSIS — K560 Paralytic ileus: Secondary | ICD-10-CM | POA: Diagnosis not present

## 2011-07-13 DIAGNOSIS — I6529 Occlusion and stenosis of unspecified carotid artery: Secondary | ICD-10-CM | POA: Diagnosis present

## 2011-07-13 DIAGNOSIS — I739 Peripheral vascular disease, unspecified: Principal | ICD-10-CM | POA: Diagnosis present

## 2011-07-13 DIAGNOSIS — Z86718 Personal history of other venous thrombosis and embolism: Secondary | ICD-10-CM

## 2011-07-13 DIAGNOSIS — R7309 Other abnormal glucose: Secondary | ICD-10-CM | POA: Diagnosis present

## 2011-07-13 DIAGNOSIS — N529 Male erectile dysfunction, unspecified: Secondary | ICD-10-CM | POA: Diagnosis present

## 2011-07-13 DIAGNOSIS — Z7982 Long term (current) use of aspirin: Secondary | ICD-10-CM

## 2011-07-13 HISTORY — PX: CYSTOSCOPY: SHX5120

## 2011-07-13 HISTORY — PX: AORTA - BILATERAL FEMORAL ARTERY BYPASS GRAFT: SHX1175

## 2011-07-13 LAB — PROTIME-INR
INR: 1.25 (ref 0.00–1.49)
Prothrombin Time: 16 seconds — ABNORMAL HIGH (ref 11.6–15.2)

## 2011-07-13 LAB — BASIC METABOLIC PANEL
CO2: 22 mEq/L (ref 19–32)
Calcium: 8.1 mg/dL — ABNORMAL LOW (ref 8.4–10.5)
Creatinine, Ser: 1.7 mg/dL — ABNORMAL HIGH (ref 0.50–1.35)
GFR calc non Af Amer: 38 mL/min — ABNORMAL LOW (ref >90)
Glucose, Bld: 138 mg/dL — ABNORMAL HIGH (ref 70–99)
Sodium: 135 mEq/L (ref 135–145)

## 2011-07-13 LAB — POCT I-STAT 4, (NA,K, GLUC, HGB,HCT)
Glucose, Bld: 102 mg/dL — ABNORMAL HIGH (ref 70–99)
Glucose, Bld: 122 mg/dL — ABNORMAL HIGH (ref 70–99)
HCT: 25 % — ABNORMAL LOW (ref 39.0–52.0)
Hemoglobin: 8.5 g/dL — ABNORMAL LOW (ref 13.0–17.0)
Sodium: 141 mEq/L (ref 135–145)

## 2011-07-13 LAB — POCT I-STAT 7, (LYTES, BLD GAS, ICA,H+H)
Acid-base deficit: 3 mmol/L — ABNORMAL HIGH (ref 0.0–2.0)
Acid-base deficit: 5 mmol/L — ABNORMAL HIGH (ref 0.0–2.0)
Calcium, Ion: 1.15 mmol/L (ref 1.12–1.32)
HCT: 26 % — ABNORMAL LOW (ref 39.0–52.0)
O2 Saturation: 100 %
Patient temperature: 35.5
Potassium: 4.2 mEq/L (ref 3.5–5.1)
Potassium: 4.7 mEq/L (ref 3.5–5.1)
Sodium: 136 mEq/L (ref 135–145)
Sodium: 137 mEq/L (ref 135–145)
TCO2: 23 mmol/L (ref 0–100)
pCO2 arterial: 44.5 mmHg (ref 35.0–45.0)

## 2011-07-13 LAB — CBC
MCH: 31.6 pg (ref 26.0–34.0)
MCHC: 33 g/dL (ref 30.0–36.0)
MCV: 95.7 fL (ref 78.0–100.0)
Platelets: 123 10*3/uL — ABNORMAL LOW (ref 150–400)
RBC: 3.48 MIL/uL — ABNORMAL LOW (ref 4.22–5.81)

## 2011-07-13 LAB — MAGNESIUM: Magnesium: 1.4 mg/dL — ABNORMAL LOW (ref 1.5–2.5)

## 2011-07-13 SURGERY — CREATION, BYPASS, ARTERIAL, AORTA TO FEMORAL, BILATERAL, USING GRAFT
Anesthesia: General | Site: Urethra | Wound class: Clean

## 2011-07-13 MED ORDER — ACETAMINOPHEN 325 MG PO TABS
325.0000 mg | ORAL_TABLET | ORAL | Status: DC | PRN
  Administered 2011-07-19 – 2011-07-22 (×2): 650 mg via ORAL
  Filled 2011-07-13 (×2): qty 2

## 2011-07-13 MED ORDER — SODIUM CHLORIDE 0.9 % IV SOLN: INTRAVENOUS | Status: DC

## 2011-07-13 MED ORDER — FUROSEMIDE 10 MG/ML IJ SOLN
INTRAMUSCULAR | Status: DC | PRN
  Administered 2011-07-13: 40 mg via INTRAMUSCULAR

## 2011-07-13 MED ORDER — HEMOSTATIC AGENTS (NO CHARGE) OPTIME
TOPICAL | Status: DC | PRN
  Administered 2011-07-13: 1 via TOPICAL

## 2011-07-13 MED ORDER — LACTATED RINGERS IV SOLN
INTRAVENOUS | Status: DC | PRN
  Administered 2011-07-13 (×3): via INTRAVENOUS

## 2011-07-13 MED ORDER — ACETAMINOPHEN 650 MG RE SUPP: 325.0000 mg | RECTAL | Status: DC | PRN

## 2011-07-13 MED ORDER — STERILE WATER FOR IRRIGATION IR SOLN
Status: DC | PRN
  Administered 2011-07-13: 2000 mL

## 2011-07-13 MED ORDER — MAGNESIUM SULFATE 40 MG/ML IJ SOLN: 2.0000 g | Freq: Once | INTRAMUSCULAR | Status: AC | PRN

## 2011-07-13 MED ORDER — PHENOL 1.4 % MT LIQD: 1.0000 | OROMUCOSAL | Status: DC | PRN

## 2011-07-13 MED ORDER — LABETALOL HCL 5 MG/ML IV SOLN
5.0000 mg | INTRAVENOUS | Status: DC | PRN
  Administered 2011-07-13 (×4): 5 mg via INTRAVENOUS

## 2011-07-13 MED ORDER — MORPHINE SULFATE 2 MG/ML IJ SOLN
INTRAMUSCULAR | Status: AC
  Filled 2011-07-13: qty 1

## 2011-07-13 MED ORDER — ONDANSETRON HCL 4 MG/2ML IJ SOLN: 4.0000 mg | Freq: Four times a day (QID) | INTRAMUSCULAR | Status: DC | PRN

## 2011-07-13 MED ORDER — HYDRALAZINE HCL 20 MG/ML IJ SOLN
20.0000 mg | Freq: Once | INTRAMUSCULAR | Status: AC
  Administered 2011-07-13: 20 mg via INTRAVENOUS
  Filled 2011-07-13: qty 1

## 2011-07-13 MED ORDER — PROTAMINE SULFATE 10 MG/ML IV SOLN
INTRAVENOUS | Status: DC | PRN
  Administered 2011-07-13: 50 mg via INTRAVENOUS

## 2011-07-13 MED ORDER — LACTATED RINGERS IV SOLN
INTRAVENOUS | Status: DC | PRN
  Administered 2011-07-13: 07:00:00 via INTRAVENOUS

## 2011-07-13 MED ORDER — PHENYLEPHRINE HCL 10 MG/ML IJ SOLN
10.0000 mg | INTRAVENOUS | Status: DC | PRN
  Administered 2011-07-13: 10 ug/min via INTRAVENOUS

## 2011-07-13 MED ORDER — KCL IN DEXTROSE-NACL 20-5-0.45 MEQ/L-%-% IV SOLN
INTRAVENOUS | Status: DC
  Administered 2011-07-13: 22:00:00 via INTRAVENOUS
  Filled 2011-07-13 (×3): qty 1000

## 2011-07-13 MED ORDER — ALBUMIN HUMAN 5 % IV SOLN
INTRAVENOUS | Status: DC | PRN
  Administered 2011-07-13 (×4): via INTRAVENOUS

## 2011-07-13 MED ORDER — LABETALOL HCL 5 MG/ML IV SOLN
10.0000 mg | INTRAVENOUS | Status: DC | PRN
  Administered 2011-07-13 – 2011-07-18 (×7): 10 mg via INTRAVENOUS
  Filled 2011-07-13 (×6): qty 4

## 2011-07-13 MED ORDER — VECURONIUM BROMIDE 10 MG IV SOLR
INTRAVENOUS | Status: DC | PRN
  Administered 2011-07-13: 3 mg via INTRAVENOUS
  Administered 2011-07-13: 5 mg via INTRAVENOUS
  Administered 2011-07-13: 1 mg via INTRAVENOUS
  Administered 2011-07-13: 2 mg via INTRAVENOUS
  Administered 2011-07-13: 1 mg via INTRAVENOUS

## 2011-07-13 MED ORDER — GLYCOPYRROLATE 0.2 MG/ML IJ SOLN
INTRAMUSCULAR | Status: DC | PRN
  Administered 2011-07-13: 0.2 mg via INTRAVENOUS
  Administered 2011-07-13: .6 mg via INTRAVENOUS

## 2011-07-13 MED ORDER — MANNITOL 25 % IV SOLN
INTRAVENOUS | Status: DC | PRN
  Administered 2011-07-13 (×2): 12.5 g via INTRAVENOUS

## 2011-07-13 MED ORDER — ROCURONIUM BROMIDE 100 MG/10ML IV SOLN
INTRAVENOUS | Status: DC | PRN
  Administered 2011-07-13: 50 mg via INTRAVENOUS

## 2011-07-13 MED ORDER — HYDRALAZINE HCL 20 MG/ML IJ SOLN
10.0000 mg | INTRAMUSCULAR | Status: DC | PRN
  Administered 2011-07-13 – 2011-07-16 (×4): 10 mg via INTRAVENOUS
  Filled 2011-07-13 (×2): qty 0.5

## 2011-07-13 MED ORDER — ONDANSETRON HCL 4 MG/2ML IJ SOLN: 4.0000 mg | Freq: Once | INTRAMUSCULAR | Status: DC | PRN

## 2011-07-13 MED ORDER — SODIUM CHLORIDE 0.9 % IV SOLN
200.0000 ug | INTRAVENOUS | Status: DC | PRN
  Administered 2011-07-13: 0.2 ug/kg/h via INTRAVENOUS

## 2011-07-13 MED ORDER — METOPROLOL TARTRATE 1 MG/ML IV SOLN
INTRAVENOUS | Status: DC | PRN
  Administered 2011-07-13: 1 mg via INTRAVENOUS
  Administered 2011-07-13 (×2): 2 mg via INTRAVENOUS

## 2011-07-13 MED ORDER — OXYCODONE HCL 5 MG PO TABS
5.0000 mg | ORAL_TABLET | ORAL | Status: DC | PRN
  Administered 2011-07-20 – 2011-07-25 (×13): 10 mg via ORAL
  Filled 2011-07-13 (×14): qty 2

## 2011-07-13 MED ORDER — DOPAMINE-DEXTROSE 3.2-5 MG/ML-% IV SOLN: 3.0000 ug/kg/min | INTRAVENOUS | Status: DC

## 2011-07-13 MED ORDER — FAMOTIDINE IN NACL 20-0.9 MG/50ML-% IV SOLN
20.0000 mg | Freq: Two times a day (BID) | INTRAVENOUS | Status: DC
  Administered 2011-07-13 – 2011-07-17 (×8): 20 mg via INTRAVENOUS
  Filled 2011-07-13 (×8): qty 50

## 2011-07-13 MED ORDER — LABETALOL HCL 5 MG/ML IV SOLN
INTRAVENOUS | Status: AC
  Administered 2011-07-13: 5 mg via INTRAVENOUS
  Filled 2011-07-13: qty 4

## 2011-07-13 MED ORDER — NITROGLYCERIN IN D5W 200-5 MCG/ML-% IV SOLN
2.0000 ug/min | INTRAVENOUS | Status: DC
  Administered 2011-07-13: 5 ug/min via INTRAVENOUS
  Filled 2011-07-13: qty 250

## 2011-07-13 MED ORDER — ONDANSETRON HCL 4 MG/2ML IJ SOLN
INTRAMUSCULAR | Status: DC | PRN
  Administered 2011-07-13: 4 mg via INTRAVENOUS

## 2011-07-13 MED ORDER — SODIUM CHLORIDE 0.9 % IR SOLN
Status: DC | PRN
  Administered 2011-07-13: 10:00:00

## 2011-07-13 MED ORDER — GUAIFENESIN-DM 100-10 MG/5ML PO SYRP: 15.0000 mL | ORAL_SOLUTION | ORAL | Status: DC | PRN

## 2011-07-13 MED ORDER — METOPROLOL TARTRATE 1 MG/ML IV SOLN: 2.0000 mg | INTRAVENOUS | Status: DC | PRN

## 2011-07-13 MED ORDER — MIDAZOLAM HCL 5 MG/5ML IJ SOLN
INTRAMUSCULAR | Status: DC | PRN
  Administered 2011-07-13: 2 mg via INTRAVENOUS

## 2011-07-13 MED ORDER — HYDROMORPHONE HCL PF 1 MG/ML IJ SOLN
INTRAMUSCULAR | Status: AC
  Filled 2011-07-13: qty 1

## 2011-07-13 MED ORDER — PROPOFOL 10 MG/ML IV EMUL
INTRAVENOUS | Status: DC | PRN
  Administered 2011-07-13: 200 mg via INTRAVENOUS

## 2011-07-13 MED ORDER — LACTATED RINGERS IV SOLN
INTRAVENOUS | Status: DC | PRN
  Administered 2011-07-13 (×2): via INTRAVENOUS

## 2011-07-13 MED ORDER — HYDROMORPHONE HCL PF 1 MG/ML IJ SOLN
0.2500 mg | INTRAMUSCULAR | Status: DC | PRN
  Administered 2011-07-13 (×4): 0.5 mg via INTRAVENOUS

## 2011-07-13 MED ORDER — MUPIROCIN 2 % EX OINT
TOPICAL_OINTMENT | CUTANEOUS | Status: AC
  Administered 2011-07-13: 06:00:00
  Filled 2011-07-13: qty 22

## 2011-07-13 MED ORDER — HEPARIN SODIUM (PORCINE) 1000 UNIT/ML IJ SOLN
INTRAMUSCULAR | Status: DC | PRN
  Administered 2011-07-13: 1000 [IU] via INTRAVENOUS
  Administered 2011-07-13: 7000 [IU] via INTRAVENOUS
  Administered 2011-07-13: 2000 [IU] via INTRAVENOUS

## 2011-07-13 MED ORDER — POTASSIUM CHLORIDE CRYS ER 20 MEQ PO TBCR: 20.0000 meq | EXTENDED_RELEASE_TABLET | Freq: Once | ORAL | Status: AC | PRN

## 2011-07-13 MED ORDER — 0.9 % SODIUM CHLORIDE (POUR BTL) OPTIME
TOPICAL | Status: DC | PRN
  Administered 2011-07-13: 1000 mL

## 2011-07-13 MED ORDER — DOCUSATE SODIUM 100 MG PO CAPS
100.0000 mg | ORAL_CAPSULE | Freq: Every day | ORAL | Status: DC
  Administered 2011-07-16 – 2011-07-25 (×10): 100 mg via ORAL
  Filled 2011-07-13 (×10): qty 1

## 2011-07-13 MED ORDER — TEMAZEPAM 15 MG PO CAPS: 15.0000 mg | ORAL_CAPSULE | Freq: Every evening | ORAL | Status: DC | PRN

## 2011-07-13 MED ORDER — ALUM & MAG HYDROXIDE-SIMETH 400-400-40 MG/5ML PO SUSP: 15.0000 mL | ORAL | Status: DC | PRN

## 2011-07-13 MED ORDER — SODIUM CHLORIDE 0.9 % IV SOLN: 500.0000 mL | Freq: Once | INTRAVENOUS | Status: AC | PRN

## 2011-07-13 MED ORDER — LABETALOL HCL 5 MG/ML IV SOLN
INTRAVENOUS | Status: AC
  Administered 2011-07-13: 20 mg
  Filled 2011-07-13: qty 4

## 2011-07-13 MED ORDER — ALUM & MAG HYDROXIDE-SIMETH 400-400-40 MG/5ML PO SUSP
15.0000 mL | ORAL | Status: DC | PRN
  Filled 2011-07-13: qty 30

## 2011-07-13 MED ORDER — NITROGLYCERIN IN D5W 200-5 MCG/ML-% IV SOLN
INTRAVENOUS | Status: DC | PRN
  Administered 2011-07-13: 5 ug/min via INTRAVENOUS

## 2011-07-13 MED ORDER — MORPHINE SULFATE 2 MG/ML IJ SOLN
2.0000 mg | INTRAMUSCULAR | Status: DC | PRN
  Administered 2011-07-13: 2 mg via INTRAVENOUS
  Administered 2011-07-14: 4 mg via INTRAVENOUS
  Administered 2011-07-14 (×2): 2 mg via INTRAVENOUS
  Administered 2011-07-14 (×2): 4 mg via INTRAVENOUS
  Administered 2011-07-14: 2 mg via INTRAVENOUS
  Administered 2011-07-14: 4 mg via INTRAVENOUS
  Filled 2011-07-13: qty 2
  Filled 2011-07-13: qty 1
  Filled 2011-07-13 (×2): qty 2
  Filled 2011-07-13: qty 1
  Filled 2011-07-13: qty 2
  Filled 2011-07-13 (×2): qty 1

## 2011-07-13 MED ORDER — ALBUTEROL SULFATE HFA 108 (90 BASE) MCG/ACT IN AERS
2.0000 | INHALATION_SPRAY | Freq: Four times a day (QID) | RESPIRATORY_TRACT | Status: DC | PRN
  Filled 2011-07-13: qty 6.7

## 2011-07-13 MED ORDER — SODIUM CHLORIDE 0.9 % IV SOLN
0.4000 ug/kg/h | INTRAVENOUS | Status: DC
  Filled 2011-07-13: qty 4

## 2011-07-13 MED ORDER — NEOSTIGMINE METHYLSULFATE 1 MG/ML IJ SOLN
INTRAMUSCULAR | Status: DC | PRN
  Administered 2011-07-13: 4 mg via INTRAVENOUS

## 2011-07-13 MED ORDER — FENTANYL CITRATE 0.05 MG/ML IJ SOLN
INTRAMUSCULAR | Status: DC | PRN
  Administered 2011-07-13 (×4): 50 ug via INTRAVENOUS
  Administered 2011-07-13: 100 ug via INTRAVENOUS
  Administered 2011-07-13: 50 ug via INTRAVENOUS
  Administered 2011-07-13: 75 ug via INTRAVENOUS
  Administered 2011-07-13: 50 ug via INTRAVENOUS
  Administered 2011-07-13: 100 ug via INTRAVENOUS
  Administered 2011-07-13: 50 ug via INTRAVENOUS
  Administered 2011-07-13: 25 ug via INTRAVENOUS

## 2011-07-13 MED ORDER — DEXTROSE 5 % IV SOLN
1.5000 g | Freq: Two times a day (BID) | INTRAVENOUS | Status: AC
  Administered 2011-07-13 – 2011-07-14 (×2): 1.5 g via INTRAVENOUS
  Filled 2011-07-13 (×2): qty 1.5

## 2011-07-13 MED ORDER — LIDOCAINE HCL 2 % EX GEL
CUTANEOUS | Status: DC | PRN
  Administered 2011-07-13: 1 via URETHRAL

## 2011-07-13 SURGICAL SUPPLY — 74 items
CANISTER SUCTION 2500CC (MISCELLANEOUS) ×3 IMPLANT
CATH COUDE FOLEY 2W 5CC 16FR (CATHETERS) ×3 IMPLANT
CATH COUDE FOLEY 5CC 14FR (CATHETERS) ×3 IMPLANT
CATH FOLEY 2W COUNCIL 5CC 16FR (CATHETERS) ×3 IMPLANT
CLIP TI MEDIUM 24 (CLIP) ×3 IMPLANT
CLIP TI WIDE RED SMALL 24 (CLIP) ×3 IMPLANT
CLOTH BEACON ORANGE TIMEOUT ST (SAFETY) ×3 IMPLANT
COVER MAYO STAND STRL (DRAPES) ×3 IMPLANT
COVER SURGICAL LIGHT HANDLE (MISCELLANEOUS) ×6 IMPLANT
DERMABOND ADVANCED (GAUZE/BANDAGES/DRESSINGS) ×5
DERMABOND ADVANCED .7 DNX12 (GAUZE/BANDAGES/DRESSINGS) ×10 IMPLANT
DRAPE WARM FLUID 44X44 (DRAPE) ×3 IMPLANT
ELECT BLADE 4.0 EZ CLEAN MEGAD (MISCELLANEOUS) ×3
ELECT BLADE 6.5 EXT (BLADE) IMPLANT
ELECT REM PT RETURN 9FT ADLT (ELECTROSURGICAL) ×3
ELECTRODE BLDE 4.0 EZ CLN MEGD (MISCELLANEOUS) ×2 IMPLANT
ELECTRODE REM PT RTRN 9FT ADLT (ELECTROSURGICAL) ×2 IMPLANT
GLOVE BIO SURGEON STRL SZ 6.5 (GLOVE) ×12 IMPLANT
GLOVE BIO SURGEON STRL SZ7 (GLOVE) ×9 IMPLANT
GLOVE BIOGEL PI IND STRL 7.0 (GLOVE) ×10 IMPLANT
GLOVE BIOGEL PI IND STRL 7.5 (GLOVE) ×8 IMPLANT
GLOVE BIOGEL PI INDICATOR 7.0 (GLOVE) ×5
GLOVE BIOGEL PI INDICATOR 7.5 (GLOVE) ×4
GLOVE SS BIOGEL STRL SZ 6.5 (GLOVE) ×2 IMPLANT
GLOVE SUPERSENSE BIOGEL SZ 6.5 (GLOVE) ×1
GLOVE SURG SS PI 7.5 STRL IVOR (GLOVE) ×3 IMPLANT
GOWN PREVENTION PLUS XLARGE (GOWN DISPOSABLE) ×3 IMPLANT
GOWN PREVENTION PLUS XXLARGE (GOWN DISPOSABLE) ×3 IMPLANT
GOWN STRL NON-REIN LRG LVL3 (GOWN DISPOSABLE) ×21 IMPLANT
GOWN STRL REIN XL XLG (GOWN DISPOSABLE) ×9 IMPLANT
GRAFT HEMASHIELD 16X8MM (Vascular Products) ×3 IMPLANT
GUIDEWIRE ANG ZIPWIRE 038X150 (WIRE) ×3 IMPLANT
HEMOSTAT SNOW SURGICEL 2X4 (HEMOSTASIS) ×9 IMPLANT
HEMOSTAT SURGICEL 2X14 (HEMOSTASIS) IMPLANT
INSERT FOGARTY 61MM (MISCELLANEOUS) ×3 IMPLANT
INSERT FOGARTY SM (MISCELLANEOUS) ×6 IMPLANT
KIT BASIN OR (CUSTOM PROCEDURE TRAY) ×3 IMPLANT
KIT ROOM TURNOVER OR (KITS) ×3 IMPLANT
NS IRRIG 1000ML POUR BTL (IV SOLUTION) ×6 IMPLANT
PACK AORTA (CUSTOM PROCEDURE TRAY) ×3 IMPLANT
PAD ARMBOARD 7.5X6 YLW CONV (MISCELLANEOUS) ×6 IMPLANT
PUNCH AORTIC ROTATE 5MM 8IN (MISCELLANEOUS) ×3 IMPLANT
SET CYSTO W/LG BORE CLAMP LF (SET/KITS/TRAYS/PACK) ×3 IMPLANT
SPECIMEN JAR MEDIUM (MISCELLANEOUS) ×3 IMPLANT
SPONGE GAUZE 4X4 12PLY (GAUZE/BANDAGES/DRESSINGS) ×3 IMPLANT
SUT ETHIBOND 5 LR DA (SUTURE) IMPLANT
SUT PDS AB 1 TP1 54 (SUTURE) ×6 IMPLANT
SUT PROLENE 3 0 SH 48 (SUTURE) ×18 IMPLANT
SUT PROLENE 5 0 C 1 24 (SUTURE) ×6 IMPLANT
SUT PROLENE 5 0 C 1 36 (SUTURE) IMPLANT
SUT PROLENE 6 0 BV (SUTURE) ×6 IMPLANT
SUT SILK 2 0 (SUTURE) ×1
SUT SILK 2 0 TIES 17X18 (SUTURE) ×1
SUT SILK 2 0SH CR/8 30 (SUTURE) ×3 IMPLANT
SUT SILK 2-0 18XBRD TIE 12 (SUTURE) ×2 IMPLANT
SUT SILK 2-0 18XBRD TIE BLK (SUTURE) ×2 IMPLANT
SUT SILK 3 0 (SUTURE) ×3
SUT SILK 3 0 TIES 17X18 (SUTURE) ×1
SUT SILK 3-0 18XBRD TIE 12 (SUTURE) ×6 IMPLANT
SUT SILK 3-0 18XBRD TIE BLK (SUTURE) ×2 IMPLANT
SUT VIC AB 2-0 CT1 27 (SUTURE) ×4
SUT VIC AB 2-0 CT1 36 (SUTURE) ×15 IMPLANT
SUT VIC AB 2-0 CT1 TAPERPNT 27 (SUTURE) ×8 IMPLANT
SUT VIC AB 3-0 SH 27 (SUTURE) ×6
SUT VIC AB 3-0 SH 27X BRD (SUTURE) ×12 IMPLANT
SUT VICRYL 4-0 PS2 18IN ABS (SUTURE) ×18 IMPLANT
SYRINGE 10CC LL (SYRINGE) ×3 IMPLANT
SYRINGE TOOMEY DISP (SYRINGE) ×3 IMPLANT
TOWEL BLUE STERILE X RAY DET (MISCELLANEOUS) ×6 IMPLANT
TOWEL OR 17X24 6PK STRL BLUE (TOWEL DISPOSABLE) ×6 IMPLANT
TOWEL OR 17X26 10 PK STRL BLUE (TOWEL DISPOSABLE) ×6 IMPLANT
TRAY FOLEY CATH 14FRSI W/METER (CATHETERS) ×3 IMPLANT
WATER STERILE IRR 1000ML POUR (IV SOLUTION) ×6 IMPLANT
WATER STERILE IRR 1000ML UROMA (IV SOLUTION) ×6 IMPLANT

## 2011-07-13 NOTE — OR Nursing (Signed)
Unable to insert Foley catheter. Dr. Trula Slade came in and tried to insert the Foley catheter but failed to insert.  On call urologist Dr. Janice Norrie came in at [phone] and started a cystoscopy, dilatation and insertion of a Councill 71 French Foley Catheter at [phone] and ended the procedure at [phone].  Dr. Trula Slade started the Aorta Bifemoral Bypass at 1006.

## 2011-07-13 NOTE — Transfer of Care (Signed)
Immediate Anesthesia Transfer of Care Note  Patient: Bobby Vega  Procedure(s) Performed:  AORTA BIFEMORAL BYPASS GRAFT; CYSTOSCOPY - cystoscopy,dilatation & insertion of councill foley catheter  Patient Location: PACU  Anesthesia Type: General  Level of Consciousness: awake  Airway & Oxygen Therapy: Patient Spontanous Breathing and Patient connected to face mask oxygen  Post-op Assessment: Report given to PACU RN, Post -op Vital signs reviewed and stable and Patient moving all extremities  Post vital signs: Reviewed and stable Filed Vitals:   07/13/11 1624  BP:   Temp: 36.1 C  Resp:     Complications: No apparent anesthesia complications

## 2011-07-13 NOTE — Progress Notes (Signed)
Dr. Trula Slade paged r/t high BP and meds given while in PACU.  Awaiting return call.

## 2011-07-13 NOTE — Progress Notes (Signed)
Dr. Bridgett Larsson notified of pt's high BP despite meds.  Nitro gtt ordered with goal SBP per NIBP <140-150.

## 2011-07-13 NOTE — H&P (View-Only) (Signed)
[facility] [place]   Patient name: Bobby Vega MRN: [id number] DOB: April 02, 1935 Sex: male     Chief Complaint  Patient presents with  . Carotid    new evaluation  . PVD    bilateral pain in lower legs    HISTORY OF PRESENT ILLNESS: The patient is here today for further discussions regarding his vascular insufficiency. The patient has severe symptoms in his right leg. He can walk approximately 20 feet before he does dead. This is been going on for 2 years but has gotten significantly worse. He also wakes up approximately 5 times at night to hang his leg over the side of the bed. 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 He was not a candidate for endovascular repair he comes in to discuss the surgical options.  The patient's medically managed for his hypertension hyperlipidemia he's had no acute changes. He has borderline diabetes.  Past Medical History  Diagnosis Date  . Hypertension   . Hyperlipidemia   . BPH (benign prostatic hyperplasia)   . ED (erectile dysfunction)   . Constipation   . Arthritis     hip  . Borderline diabetes   . Pain in joint, hand   . Lumbar spondylosis with myelopathy   . Cervical spondylosis without myelopathy     History reviewed. No pertinent past surgical history.  History   Social History  . Marital Status: Widowed    Spouse Name: N/A    Number of Children: N/A  . Years of Education: N/A   Occupational History  . Not on file.   Social History Main Topics  . Smoking status: Never Smoker   . Smokeless tobacco: Not on file  . Alcohol Use: Not on file  . Drug Use:   . Sexually Active:    Other Topics Concern  . Not on file   Social History Narrative  . No narrative on file    History reviewed. No pertinent family history.  Allergies as of 07/10/2011  . (No Known Allergies)    Current Outpatient Prescriptions on File  Prior to Visit  Medication Sig Dispense Refill  . albuterol (PROVENTIL HFA;VENTOLIN HFA) 108 (90 BASE) MCG/ACT inhaler Inhale 2 puffs into the lungs 4 (four) times daily as needed.        Marland Kitchen aspirin EC 81 MG tablet Take 81 mg by mouth daily.        Marland Kitchen dutasteride (AVODART) 0.5 MG capsule Take 0.5 mg by mouth daily.        Marland Kitchen esomeprazole (NEXIUM) 40 MG capsule Take 40 mg by mouth daily before breakfast.        . loratadine (CLARITIN) 10 MG tablet Take 10 mg by mouth daily.        Marland Kitchen losartan (COZAAR) 100 MG tablet Take 50 mg by mouth daily.        . naproxen (NAPROSYN) 500 MG tablet Take 500 mg by mouth 2 (two) times daily with a meal.        . pregabalin (LYRICA) 75 MG capsule Take 75 mg by mouth 3 (three) times daily.        . sildenafil (VIAGRA) 100 MG tablet Take 100 mg by mouth daily as needed.        . simvastatin (ZOCOR) 20 MG tablet Take 20 mg by mouth at bedtime.        . tadalafil (CIALIS) 20 MG tablet Take 20 mg  by mouth daily as needed.        . vitamin B-12 (CYANOCOBALAMIN) 250 MCG tablet Take 250 mcg by mouth daily.           REVIEW OF SYSTEMS: No changes from prior visit  PHYSICAL EXAMINATION:   Vital signs are BP 183/99  Pulse 58  Resp 16  Ht 5\' 5"  (1.651 m)  Wt 190 lb (86.183 kg)  BMI 31.62 kg/m2  SpO2 98% General: The patient appears their stated age. HEENT:  No gross abnormalities Pulmonary:  Non labored breathing Abdomen: Soft and non-tender no surgical incisions Musculoskeletal: There are no major deformities. Neurologic: No focal weakness or paresthesias are detected, Skin: There are no ulcer or rashes noted. Psychiatric: The patient has normal affect. Cardiovascular: There is a regular rate and rhythm without significant murmur appreciated.   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 I discussed that based on his rest pain he is in a near limb threatening situation and I would recommend proceeding with revascularization. I feel that his best option to reestablish blood flow to his legs is an aortobifemoral bypass graft. We discussed the risks and benefits including the risk of bleeding risk of cardiopulmonary complications and the risk of infection and the risk of intestinal complications. All her questions were answered today I have scheduled his operation for this Friday, January 18. He has a cardiac evaluation pending  V. Leia Alf, M.D. [facility] [place] Office: [phone] Pager:  [phone]

## 2011-07-13 NOTE — Anesthesia Postprocedure Evaluation (Signed)
  Anesthesia Post-op Note  Patient: Bobby Vega  Procedure(s) Performed:  AORTA BIFEMORAL BYPASS GRAFT; CYSTOSCOPY - cystoscopy,dilatation & insertion of councill foley catheter  Patient Location: PACU  Anesthesia Type: General  Level of Consciousness: awake, alert  and oriented  Airway and Oxygen Therapy: Patient Spontanous Breathing and Patient connected to nasal cannula oxygen  Post-op Pain: mild  Post-op Assessment: Post-op Vital signs reviewed, [name] Cardiovascular Status Stable, Respiratory Function Stable, Patent Airway, No signs of Nausea or vomiting and Pain level controlled  Post-op Vital Signs: Reviewed and stable, on NTG drip for persistent hypertension Complications: No apparent anesthesia complications

## 2011-07-13 NOTE — Consult Note (Signed)
Consultation date: 07/13/2011  Consultation requested by Dr. Annamarie Major  Reason for consultation: Foley insertion.  Patient is a 77 years old male who is scheduled for aortofemoral bypass. The nurses and the physicians could not get a Foley catheter in the bladder. I was then called and asked to insert Foley in the bladder. The patient was already under general anesthesia. On examination he had some urethral bleeding and there was blood at the meatus. I have prepped the penis with Betadine solution. I then passed the flexible cystoscope through the urethra. There is a false passage at the prostatic urethra and the cystoscope could not be passed in the bladder. The urethra is markedly distorted at this point and I was not able to identify normal urethra. I then passed a glide wire through the cystoscope. After multiple attempts I was able to pass the glide wire through a small opening in the urethra and in the guidewire was passed in the bladder. I was then able to advance the flexible cystoscope over the glidewire in the bladder. I then sequentially dilated the urethra with a Heymann's dilators up to #24 [place]. I then passed a #16 [place] council catheter over the glide wire in the bladder. The balloon of the Foley catheter was then inflated with 10 cc of water. Clear urine drained out of the bladder. The catheter was then left to straight drainage. The patient was then left in the supine position for the surgery by Dr. Rock Nephew and Dr. Scot Dock.

## 2011-07-13 NOTE — Interval H&P Note (Signed)
History and Physical Interval Note:  07/13/2011 7:24 AM  Bobby Vega  has presented today for surgery, with the diagnosis of PVD  The various methods of treatment have been discussed with the patient and family. After consideration of risks, benefits and other options for treatment, the patient has consented to  Procedure(s): AORTA BIFEMORAL BYPASS GRAFT as a surgical intervention .  The patients' history has been reviewed, patient examined, no change in status, stable for surgery.  I have reviewed the patients' chart and labs.  Questions were answered to the patient's satisfaction.     BRABHAM IV, V. WELLS

## 2011-07-13 NOTE — Op Note (Signed)
[facility] [place]  Patient name: Bobby Vega MRN: [id number] DOB: 10-10-1934 Sex: male  07/13/2011 Pre-operative Diagnosis:  right leg rest pain Post-operative diagnosis:  Same Surgeon:  Eldridge Abrahams Assistants:  Evorn Gong  Procedure:   AortoBifemoral Bypass with Bilateral profundoplasty with 16x8 Dacron graft.  Re-implant IMA Anesthesia:  General Blood Loss:  See anesthesia record Specimens:  None   Findings:  Distal anastamosis to the distal common femoral artery extending out onto the profunda femoral artery approximately 1 cm bilaterally. The inferior mesenteric artery was reimplanted just above the bifurcation of the graft on the left lateral side. Severe difficulty placing the Foley catheter requiring urology assistance. His Foley is going to remain in for at least 2 weeks  Indications:  The patient has been suffering from leg pain for many years it has gotten progressively worse. He wakes up several times at night with right leg issues. 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 Please see my clinic note for full details of our discussion we elected to proceed with aortobifemoral bypass graft. Risks and benefits were discussed and informed consent was signed.  Procedure:  The patient was identified in the holding area and taken to [facility]  The patient was then placed supine on the table. general anesthesia was administered.  We had difficulty placing a Foley catheter and had to call a urology consult. Cystoscopy was performed. Please see their note for full details. This was a very difficult Foley insertion. Once the Foley was placed, the patient was prepped and draped in the usual sterile fashion.  A time out was called and antibiotics were administered.  Longitudinal incisions were made in each groin.  The patient had multiple venous tributaries in each groin which were divided individually between 3-0 silk ties. Ultimately the common femoral arteries were exposed bilaterally. Several crossing veins under the inguinal ligament were identified and ligated between silk ties. I dissected out the common femoral artery down to the bifurcation and then approximately 2-1/2 cm on each profunda femoral arteries. Once full exposure of the groin was obtained the groins were packed with wet gauze.  Attention was then turned towards the abdomen. A midline incision was made from the xiphoid to below the umbilicus. Cautery was used to divide the subcutaneous tissue. The fascia was then opened in the midline using cautery. The abdomen was then entered and explored. No gross pathology was identified. The gallbladder and appendix appeared normal. There were no masses. Next wet towels were placed to protect the skin edges and a Balfour was placed. A nonretractable is also set up. The colon was reflected cephalad and the small bowel mobilized to the patient's right. The ligament of Treitz was taken down with a combination of cautery and sharp dissection. I did not divide the inferior mesenteric vein. The aorta was exposed. The inferior mesenteric artery was encircled with a vessel loop. I dissected out the aorta from the renal arteries bilaterally down to just below the bifurcation. A tunnel was created between the abdominal incision and each groin incision going directly anterior to the iliac vessels. I stayed posterior to the ureters. An umbilical tape was passed. At this point in time the patient was fully heparinized. After the heparin circulated the aorta was occluded with a Harken clamp. A Zanger clamp was placed on the distal aorta. The aorta  was then transected. A portion was removed. The distal end was ligated proximal to the inferior mesenteric artery using a running 3-0 Prolene and a felt strip. Next I set up for the  proximal anastomosis. I selected a 16 x 8 bifurcated dacryon graft. 3 posterior vertical mattress sutures were placed with a felt strip. The anastomosis was then completed incorporating a felt strip. The clamp was then released. The limbs were brought through the previously created tunnel. Vascular clamps were used to occlude the proximal common femoral artery as well as the profunda femoral arteries. The superficial femoral arteries were chronically occluded. The right groin was addressed first. A #11 blade was used to make an arteriotomy in the distal common femoral artery which was extended longitudinally with Potts scissors going approximately 1 cm out onto the profunda femoral artery. The graft was cut to the appropriate length and spatulated to fit the size of the arteriotomy. A running anastomosis was created with 5-0 Prolene. Prior to completion of the anastomosis the appropriate flushing maneuvers were performed the anastomosis was then completed and blood flow was reestablished to the right leg. Next attention was turned towards the left leg. After occluding the artery a #11 blade was used to make an arteriotomy in the distal common femoral artery which was extended down onto the profunda femoral artery for approximately 5 mm the graft was then cut to the appropriate length and spatulated to fit the size of the arteriotomy. A running anastomosis was created with 5-0 Prolene. Prior to completion appropriate flushing maneuvers were performed and the anastomosis was then completed. Blood flow was then reestablished to the left leg. The groins were then packed and attention was turned to the abdomen.  I elected to reimplant the inferior mesenteric artery because of the location of the graft had 3 occlude the graft. A #11 blade was used to make a graftotomy which was opened further with a 5 mm punch. The IMA was ligated with 2-0 silk tie at its origin. An end-to-side anastomosis was created with running 6-0  Prolene on a left lateral side of the aortic graft. Once this was performed Doppler was used to evaluate signal the inferior mesenteric artery had a multiphasic signal. There also brisk signals in both profunda femoral arteries. At this point I was satisfied with the repair the patient's heparin was reversed with 50 mg of protamine. Once hemostasis was satisfactory the groins were closed in multiple layers of 20 and 3-0 Vicryl and the skin was closed with 4-0 Vicryl. The abdomen was then irrigated the retroperitoneal tissue was reapproximated with 2-0 Vicryl. The small bowel was placed back into its anatomic position. I ran the bowel to make sure there were no defects. The bowel appeared healthy. I then closed the fascia with 2 running #1 PDS suture. Subcutaneous tissues closed with 2-0 Vicryl the skin was closed with 4-0 Vicryl. Dermabond placed on the wound. Patient tolerated the procedure well there no complications.   Disposition:  To PACU in stable condition.   Theotis Burrow, M.D. [facility] Office: [phone] Pager:  [phone]

## 2011-07-13 NOTE — Anesthesia Procedure Notes (Addendum)
Procedure Name: Intubation Date/Time: 07/13/2011 7:53 AM Performed by: Arnaldo Natal Pre-anesthesia Checklist: Patient identified, Emergency Drugs available, Suction available and Patient being monitored Patient Re-evaluated:Patient Re-evaluated prior to inductionOxygen Delivery Method: Circle System Utilized Preoxygenation: Pre-oxygenation with 100% oxygen Intubation Type: IV induction Ventilation: Mask ventilation without difficulty and Oral airway inserted - appropriate to patient size Laryngoscope Size: Mac and 4 Grade View: Grade I Tube type: Oral Tube size: 8.0 mm Number of attempts: 1 Airway Equipment and Method: stylet Placement Confirmation: ETT inserted through vocal cords under direct vision,  positive ETCO2 and breath sounds checked- equal and bilateral Secured at: 22 cm Tube secured with: Tape Dental Injury: Teeth and Oropharynx as per pre-operative assessment     RIJ Gordy CouncilmanXC:[id number] in PACU hold The patient was identified and consent obtained.  TO was performed, and full barrier precautions were used.  The skin was anesthetized with lidocaine 1% 4cc 25 g needle.  Once the vein was located with the 22 ga. needle using ultrasound guidence , the wire was inserted into the vein.  The wire location was confirmed with ultrasound.  The insertion was dilated and the catheter was carefully inserted and sutured in place. The PAC was checked and inserted with the balloon inflated until a pulmonary waveform was seen.  The balloon was deflated, and the catheter left in place. The patient tolerated the procedure well.   CE

## 2011-07-13 NOTE — Progress Notes (Signed)
ANTIBIOTIC CONSULT NOTE - FOLLOW UP  Pharmacy Consult for cefuroxime Indication: dosing adjustment for post-op abx No Known Allergies  Patient Measurements: Weight: 186 lb 15.2 oz (84.8 kg)  Labs:  Basename 07/13/11 1715 07/13/11 1440 07/13/11 1249 07/12/11 1203  WBC 11.2* -- -- 7.4  HGB 11.0* 9.9* 8.8* --  PLT 123* -- -- 168  LABCREA -- -- -- --  CREATININE 1.70* -- -- 1.94*     Microbiology: Recent Results (from the past 720 hour(s))  SURGICAL PCR SCREEN     Status: Abnormal   Collection Time   07/12/11 11:55 AM      Component Value Range Status Comment   MRSA, PCR POSITIVE (*) NEGATIVE  Final    Staphylococcus aureus POSITIVE (*) NEGATIVE  Final     Anti-infectives     Start     Dose/Rate Route Frequency Ordered Stop   07/13/11 2200   cefUROXime (ZINACEF) 1.5 g in dextrose 5 % 50 mL IVPB        1.5 g 100 mL/hr over 30 Minutes Intravenous Every 12 hours 07/13/11 2038 07/14/11 2159   07/13/11 0600   cefUROXime (ZINACEF) 1.5 g in dextrose 5 % 50 mL IVPB        1.5 g 100 mL/hr over 30 Minutes Intravenous On call to O.R. 07/12/11 1453 07/13/11 0940          Assessment: 76 YOM s/o aorta bifemoral bypass graft ordered cefuroxime 1.5gm IV q12h x 2 doses post-op. SCr is elevated for a predicted CrCl ~5ml/min.    Plan: 1. Cefuroxime 1.5gm IV q12h is appropriate, no changes.   Clovis Riley 07/13/2011,8:51 PM

## 2011-07-13 NOTE — Anesthesia Preprocedure Evaluation (Addendum)
Anesthesia Evaluation  Patient identified by MRN, date of birth, ID band Patient awake    Reviewed: Allergy & Precautions, H&P , NPO status   Airway Mallampati: I TM Distance: >3 FB Neck ROM: Full    Dental  (+) Edentulous Upper and Edentulous Lower   Pulmonary          Cardiovascular hypertension, Pt. on medications Regular     Neuro/Psych PSYCHIATRIC DISORDERS    GI/Hepatic GERD-  Controlled and Medicated,  Endo/Other    Renal/GU      Musculoskeletal   Abdominal   Peds  Hematology   Anesthesia Other Findings   Reproductive/Obstetrics                         Anesthesia Physical Anesthesia Plan  ASA: III  Anesthesia Plan: General   Post-op Pain Management:    Induction: Intravenous  Airway Management Planned: Oral ETT  Additional Equipment: PA Cath, Arterial line and Ultrasound Guidance Line Placement  Intra-op Plan:   Post-operative Plan: Possible Post-op intubation/ventilation  Informed Consent: I have reviewed the patients History and Physical, chart, labs and discussed the procedure including the risks, benefits and alternatives for the proposed anesthesia with the patient or authorized representative who has indicated his/her understanding and acceptance.   Dental advisory given  Plan Discussed with: CRNA and Surgeon  Anesthesia Plan Comments:        Anesthesia Quick Evaluation

## 2011-07-14 ENCOUNTER — Inpatient Hospital Stay (HOSPITAL_COMMUNITY): Payer: PRIVATE HEALTH INSURANCE

## 2011-07-14 LAB — COMPREHENSIVE METABOLIC PANEL
ALT: 11 U/L (ref 0–53)
Alkaline Phosphatase: 58 U/L (ref 39–117)
CO2: 23 mEq/L (ref 19–32)
Calcium: 8 mg/dL — ABNORMAL LOW (ref 8.4–10.5)
GFR calc Af Amer: 42 mL/min — ABNORMAL LOW (ref >90)
GFR calc non Af Amer: 36 mL/min — ABNORMAL LOW (ref >90)
Glucose, Bld: 142 mg/dL — ABNORMAL HIGH (ref 70–99)
Sodium: 133 mEq/L — ABNORMAL LOW (ref 135–145)

## 2011-07-14 LAB — CBC
Hemoglobin: 10.7 g/dL — ABNORMAL LOW (ref 13.0–17.0)
MCH: 30.7 pg (ref 26.0–34.0)
RBC: 3.48 MIL/uL — ABNORMAL LOW (ref 4.22–5.81)

## 2011-07-14 LAB — POCT I-STAT 3, ART BLOOD GAS (G3+)
Bicarbonate: 22.8 mEq/L (ref 20.0–24.0)
Patient temperature: 98.6
TCO2: 24 mmol/L (ref 0–100)
pCO2 arterial: 52.1 mmHg — ABNORMAL HIGH (ref 35.0–45.0)
pH, Arterial: 7.25 — ABNORMAL LOW (ref 7.350–7.450)

## 2011-07-14 MED ORDER — MORPHINE SULFATE (PF) 1 MG/ML IV SOLN
INTRAVENOUS | Status: DC
  Administered 2011-07-14: 20:00:00 via INTRAVENOUS
  Administered 2011-07-15: 18 mg via INTRAVENOUS
  Administered 2011-07-15: 9 mg via INTRAVENOUS
  Administered 2011-07-15: 4.5 mg via INTRAVENOUS
  Administered 2011-07-15: 6 mg via INTRAVENOUS
  Administered 2011-07-15: 13:00:00 via INTRAVENOUS
  Administered 2011-07-15: 4.5 mg via INTRAVENOUS
  Administered 2011-07-16: 14:00:00 via INTRAVENOUS
  Administered 2011-07-16: 5 mg via INTRAVENOUS
  Administered 2011-07-16: 4.5 mg via INTRAVENOUS
  Administered 2011-07-16: 4 mg via INTRAVENOUS
  Administered 2011-07-16: 12.8 mg via INTRAVENOUS
  Administered 2011-07-16: 15 mg via INTRAVENOUS
  Administered 2011-07-16: 06:00:00 via INTRAVENOUS
  Administered 2011-07-16: 9 mg via INTRAVENOUS
  Administered 2011-07-17: 12 mg via INTRAVENOUS
  Administered 2011-07-17: 9 mg via INTRAVENOUS
  Administered 2011-07-17: 10.5 mg via INTRAVENOUS
  Administered 2011-07-17: 14.5 mg via INTRAVENOUS
  Administered 2011-07-17 (×2): via INTRAVENOUS
  Administered 2011-07-17: 3.29 mg via INTRAVENOUS
  Administered 2011-07-17: 9 mg via INTRAVENOUS
  Administered 2011-07-18: 10:00:00 via INTRAVENOUS
  Administered 2011-07-18: 7.48 mg via INTRAVENOUS
  Administered 2011-07-18: 10.3 mg via INTRAVENOUS
  Administered 2011-07-18: 02:00:00 via INTRAVENOUS
  Administered 2011-07-18: 19.5 mg via INTRAVENOUS
  Administered 2011-07-18: 3 mg via INTRAVENOUS
  Administered 2011-07-18 – 2011-07-19 (×2): via INTRAVENOUS
  Administered 2011-07-19: 3 mg via INTRAVENOUS
  Administered 2011-07-19: 14:00:00 via INTRAVENOUS
  Administered 2011-07-19: 10.11 mg via INTRAVENOUS
  Administered 2011-07-19: 7.5 mg via INTRAVENOUS
  Administered 2011-07-20: 6 mg via INTRAVENOUS
  Administered 2011-07-20: 4.5 mg via INTRAVENOUS
  Administered 2011-07-20: 11.85 mg via INTRAVENOUS
  Administered 2011-07-20: 05:00:00 via INTRAVENOUS
  Filled 2011-07-14 (×14): qty 25

## 2011-07-14 MED ORDER — ONDANSETRON HCL 4 MG/2ML IJ SOLN: 4.0000 mg | Freq: Four times a day (QID) | INTRAMUSCULAR | Status: DC | PRN

## 2011-07-14 MED ORDER — DIPHENHYDRAMINE HCL 50 MG/ML IJ SOLN: 12.5000 mg | Freq: Four times a day (QID) | INTRAMUSCULAR | Status: DC | PRN

## 2011-07-14 MED ORDER — DIPHENHYDRAMINE HCL 12.5 MG/5ML PO ELIX
12.5000 mg | ORAL_SOLUTION | Freq: Four times a day (QID) | ORAL | Status: DC | PRN
  Filled 2011-07-14: qty 5

## 2011-07-14 MED ORDER — SODIUM CHLORIDE 0.9 % IJ SOLN: 9.0000 mL | INTRAMUSCULAR | Status: DC | PRN

## 2011-07-14 MED ORDER — DEXTROSE-NACL 5-0.9 % IV SOLN: INTRAVENOUS | Status: DC

## 2011-07-14 MED ORDER — NALOXONE HCL 0.4 MG/ML IJ SOLN: 0.4000 mg | INTRAMUSCULAR | Status: DC | PRN

## 2011-07-14 MED ORDER — DEXTROSE-NACL 5-0.45 % IV SOLN
INTRAVENOUS | Status: DC
  Administered 2011-07-14 – 2011-07-17 (×5): via INTRAVENOUS

## 2011-07-14 NOTE — Progress Notes (Signed)
Occupational Therapy Evaluation Patient Details Name: Bobby Vega MRN: [id number] DOB: 30-Dec-1934 Today's Date: 07/14/2011  Problem List:  Patient Active Problem List  Diagnoses  . Peripheral vascular disease, unspecified  . Atherosclerosis of native arteries of the extremities with intermittent claudication  . Occlusion and stenosis of carotid artery without mention of cerebral infarction    Past Medical History:  Past Medical History  Diagnosis Date  . Hyperlipidemia     takes Zocor nightly  . BPH (benign prostatic hyperplasia)     takes Avodart daily  . Constipation     Metamucil prn  . Borderline diabetes   . Pain in joint, hand   . ED (erectile dysfunction)     takes Cialis as needed as well as Viagra  . GERD (gastroesophageal reflux disease)     takes Nexium daily  . Hypertension     takes Losartan daily  . Peripheral vascular disease   . Embolism - blood clot     in stomach  . Bronchitis     yrs ago  . Arthritis     hip  . Lumbar spondylosis with myelopathy   . Cervical spondylosis without myelopathy   . Cervical spondylosis with myelopathy   . Lumbar spondylosis with myelopathy   . Chronic back pain   . Diverticulosis   . Urinary urgency   . Nocturia    Past Surgical History:  Past Surgical History  Procedure Date  . Skin graft 1983    OT Assessment/Plan/Recommendation OT Assessment Clinical Impression Statement: Pt. s/p /p Aorta Bifemoral Bypass and with increased pain affecting prior level of function of I with ADLs. Pt. will benefit from skilled OT to get pt. to min assist-supervision level with ADLs at D/C to next venue of care. OT Recommendation/Assessment: Patient will need skilled OT in the acute care venue OT Problem List: Decreased strength;Decreased activity tolerance;Impaired balance (sitting and/or standing);Decreased safety awareness;Decreased knowledge of use of DME or AE;Pain Barriers to Discharge: Decreased caregiver support OT  Therapy Diagnosis : Acute pain;Generalized weakness OT Plan OT Frequency: Min 2X/week OT Treatment/Interventions: Self-care/ADL training;DME and/or AE instruction;Therapeutic activities;Patient/family education;Balance training OT Recommendation Recommendations for Other Services: Rehab consult Follow Up Recommendations: Inpatient Rehab Equipment Recommended: Defer to next venue Individuals Consulted Consulted and Agree with Results and Recommendations: Patient;Family member/caregiver Family Member Consulted: Daughter OT Goals Acute Rehab OT Goals OT Goal Formulation: With patient/family Time For Goal Achievement: 2 weeks ADL Goals Pt Will Perform Grooming: with set-up;with supervision;Standing at sink ADL Goal: Grooming - Progress: Goal set today Pt Will Perform Lower Body Bathing: with min assist;with supervision;Sit to stand from bed ADL Goal: Lower Body Bathing - Progress: Goal set today Pt Will Perform Lower Body Dressing: with min assist;with supervision;with adaptive equipment;Sit to stand from bed ADL Goal: Lower Body Dressing - Progress: Goal set today Pt Will Transfer to Toilet: with min assist;3-in-1;Stand pivot transfer ADL Goal: Toilet Transfer - Progress: Goal set today Pt Will Perform Toileting - Hygiene: with set-up;with supervision;Sitting on 3-in-1 or toilet ADL Goal: Toileting - Hygiene - Progress: Goal set today  OT Evaluation Precautions/Restrictions  Precautions Precautions: Fall Restrictions Weight Bearing Restrictions: No Prior Functioning Home Living Lives With: Alone Receives Help From: Family Type of Home: House Home Layout: One level Home Access: Stairs to enter Entrance Stairs-Rails: None Entrance Stairs-Number of Steps: 3 Bathroom Shower/Tub: Product/process development scientist: Standard Home Adaptive Equipment: Straight cane Prior Function Level of Independence: Independent with basic ADLs;Independent with homemaking with  ambulation;Independent with gait;Independent  with transfers;Requires assistive device for independence Able to Take Stairs?: Yes Driving: Yes Vocation: Retired ADL ADL Eating/Feeding: Simulated;Set up Where Assessed - Eating/Feeding: Chair Grooming: Performed;Wash/dry face;Set up;Minimal assistance Grooming Details (indicate cue type and reason): Min assist for sitting balance due to posterior lean to compensate for abdominal pain Where Assessed - Grooming: Sitting, bed Upper Body Bathing: Simulated;Chest;Right arm;Left arm;Abdomen;Moderate assistance Where Assessed - Upper Body Bathing: Sitting, bed Lower Body Bathing: Simulated;+1 Total assistance Where Assessed - Lower Body Bathing: Sit to stand from bed Upper Body Dressing: Performed;Minimal assistance Upper Body Dressing Details (indicate cue type and reason): With donning gown and cues for reaching back to don sleeve Where Assessed - Upper Body Dressing: Sitting, bed Lower Body Dressing: Performed;+1 Total assistance Lower Body Dressing Details (indicate cue type and reason): With donning bil socks Where Assessed - Lower Body Dressing: Sit to stand from bed Toilet Transfer: Simulated;+2 Total assistance;Comment for patient % (pt=40%) Toilet Transfer Details (indicate cue type and reason): Max verbal cues for transfer technique and hand placement for increased safety Toilet Transfer Method: Stand pivot Toilet Transfer Equipment: Other (comment) Building services engineer) Toileting - Clothing Manipulation: Simulated;Maximal assistance Where Assessed - Camera operator Manipulation: Standing Toileting - Hygiene: Simulated;Maximal assistance Where Assessed - Toileting Hygiene: Standing Tub/Shower Transfer: Not assessed Tub/Shower Transfer Method: Not assessed ADL Comments: Pt. provided with hand held assist for stand pivot bed-chair and mod verbal cues for hand placement and technique. Pt. required min assist for sitting balance due to posterior  truncal lean due to abdominal pain at incisional site. Pt's daughter present and desiring rehab stay at D/C.    Cognition Cognition Orientation Level: Oriented X4    Extremity Assessment RUE Assessment RUE Assessment: Within Functional Limits LUE Assessment LUE Assessment: Within Functional Limits Mobility  Bed Mobility Bed Mobility: Yes Rolling Left: 3: Mod assist Rolling Left Details (indicate cue type and reason): cues for safe technique and encouragement.   Left Sidelying to Sit: 1: +2 Total assist;Patient percentage (comment) (pt=30%) Left Sidelying to Sit Details (indicate cue type and reason): cues for use of UEs.  pt limited by pain in abdomen.   Sitting - Scoot to Edge of Bed: 1: +2 Total assist;Patient percentage (comment) (pt=10%)    End of Session OT - End of Session Equipment Utilized During Treatment: Gait belt Activity Tolerance: Patient tolerated treatment well Patient left: in chair;with call bell in reach;with family/visitor present (daughter) Nurse Communication: Mobility status for transfers General Behavior During Session: [facility] for tasks performed Cognition: [facility] for tasks performed   Woodward Klem. OTR/L Pager [phone] 07/14/2011, 1:20 PM

## 2011-07-14 NOTE — Progress Notes (Signed)
C/o pain:  Start PCA Keep NG in place Doppler PT bilaterally Needs oob Foley must stay in per urology who will re-evaluate in 2 weeks  Bobby Vega

## 2011-07-14 NOTE — Progress Notes (Signed)
Utilization review completed. Rozanna Boer, RN, BSN. 07/14/11

## 2011-07-14 NOTE — Clinical Documentation Improvement (Signed)
HYPERTENSION DOCUMENTATION CLARIFICATION QUERY  THIS DOCUMENT IS NOT A PERMANENT PART OF THE MEDICAL RECORD    Please update your documentation within the medical record to reflect your response to this query.                                                                                         07/14/11   Dr. Trula Slade  In a better effort to capture your patient's severity of illness, reflect appropriate length of stay and utilization of resources, a review of the patient medical record has revealed the following indicators.    Based on your clinical judgment, pease document in the progress notes and discharge summary if a condition below provides greater specificity regarding the patient's hypertension postoperatively:  - Accelerated Hypertension  - Other type of hypertension  - Other Condition (please specify)  - Unable to Clinically Determine  Clinical Information:   "Post-op Vital Signs: Reviewed and stable, on NTG drip for persistent hypertension" Anesthesia Post-op Evaluation signed by Midge Minium, MD at 07/13/11 1937  BP early postoperatively Systolic [id number] - [id number] Diastolic [id number]    In responding to this query please exercise your independent judgment.  The fact that a query is asked, does not imply that any particular answer is desired or expected.   Reviewed: query never addressed - ndrgi. Closed chl and protrack.  Vilinda Flake 07/17/11  Thank You,  Erling Conte  RN BSN Certified Clinical Documentation Specialist: Cell   [phone]  Health Information Management [place]  TO RESPOND TO THE THIS QUERY, FOLLOW THE INSTRUCTIONS BELOW:  1. If needed, update documentation for the patient's encounter via the notes activity.  2. Access this query again and click edit on the In [employer].  3. After updating, or not, click F2 to complete all highlighted (required) fields concerning your review. Select "additional documentation in the  medical record" OR "no additional documentation provided".  4. Click Sign note button.  5. The deficiency will fall out of your In Basket *Please let us know if you are not able to complete this workflow by phone or e-mail (listed below).

## 2011-07-14 NOTE — Progress Notes (Signed)
Physical Therapy Evaluation Patient Details Name: Bobby Vega MRN: [id number] DOB: [birth year] Today's Date: 07/14/2011  Problem List:  Patient Active Problem List  Diagnoses  . Peripheral vascular disease, unspecified  . Atherosclerosis of native arteries of the extremities with intermittent claudication  . Occlusion and stenosis of carotid artery without mention of cerebral infarction    Past Medical History:  Past Medical History  Diagnosis Date  . Hyperlipidemia     takes Zocor nightly  . BPH (benign prostatic hyperplasia)     takes Avodart daily  . Constipation     Metamucil prn  . Borderline diabetes   . Pain in joint, hand   . ED (erectile dysfunction)     takes Cialis as needed as well as Viagra  . GERD (gastroesophageal reflux disease)     takes Nexium daily  . Hypertension     takes Losartan daily  . Peripheral vascular disease   . Embolism - blood clot     in stomach  . Bronchitis     yrs ago  . Arthritis     hip  . Lumbar spondylosis with myelopathy   . Cervical spondylosis without myelopathy   . Cervical spondylosis with myelopathy   . Lumbar spondylosis with myelopathy   . Chronic back pain   . Diverticulosis   . Urinary urgency   . Nocturia    Past Surgical History:  Past Surgical History  Procedure Date  . Skin graft 1983    PT Assessment/Plan/Recommendation PT Assessment Clinical Impression Statement: pt presents s/p Aorto Bifemoral Bypass.  pt very painful, but motivated.   PT Recommendation/Assessment: Patient will need skilled PT in the acute care venue PT Problem List: Decreased strength;Decreased activity tolerance;Decreased balance;Decreased mobility;Decreased knowledge of use of DME;Cardiopulmonary status limiting activity;Pain Barriers to Discharge: None PT Therapy Diagnosis : Difficulty walking;Acute pain PT Plan PT Frequency: Min 3X/week PT Treatment/Interventions: DME instruction;Gait training;Stair training;Functional mobility  training;Therapeutic activities;Therapeutic exercise;Balance training;Patient/family education PT Recommendation Recommendations for Other Services: Rehab consult Follow Up Recommendations: Inpatient Rehab Equipment Recommended: Defer to next venue PT Goals  Acute Rehab PT Goals PT Goal Formulation: With patient/family Time For Goal Achievement: 2 weeks Pt will Roll Supine to Left Side: Independently PT Goal: Rolling Supine to Left Side - Progress: Goal set today Pt will go Supine/Side to Sit: Independently PT Goal: Supine/Side to Sit - Progress: Goal set today Pt will go Sit to Supine/Side: Independently PT Goal: Sit to Supine/Side - Progress: Goal set today Pt will go Sit to Stand: with modified independence;with upper extremity assist PT Goal: Sit to Stand - Progress: Goal set today Pt will Ambulate: >150 feet;with modified independence;with rolling walker PT Goal: Ambulate - Progress: Goal set today  PT Evaluation Precautions/Restrictions  Precautions Precautions: Fall Restrictions Weight Bearing Restrictions: No Prior Functioning  Home Living Lives With: Alone Receives Help From: Family Type of Home: House Home Layout: One level Home Access: Stairs to enter Entrance Stairs-Rails: None Entrance Stairs-Number of Steps: 3 Bathroom Shower/Tub: Product/process development scientist: Standard Home Adaptive Equipment: Straight cane Prior Function Level of Independence: Independent with basic ADLs;Independent with homemaking with ambulation;Independent with gait;Independent with transfers;Requires assistive device for independence Able to Take Stairs?: Yes Driving: Yes Vocation: Retired Landscape architect Level: Oriented X4 Sensation/Coordination   Extremity Assessment RLE Assessment RLE Assessment:  (Grossly 4/5) LLE Assessment LLE Assessment:  (Grossly 4/5) Mobility (including Balance) Bed Mobility Bed Mobility: Yes Rolling Left: 3: Mod  assist Rolling Left Details (indicate cue type and reason):  cues for safe technique and encouragement.   Left Sidelying to Sit: 1: +2 Total assist;Patient percentage (comment) (pt 30%) Left Sidelying to Sit Details (indicate cue type and reason): cues for use of UEs.  pt limited by pain in abdomen.   Sitting - Scoot to Edge of Bed: 1: +2 Total assist;Patient percentage (comment) (pt10%) Transfers Transfers: Yes Stand Pivot Transfers: 1: +2 Total assist;Patient percentage (comment) (pt 40%) Stand Pivot Transfer Details (indicate cue type and reason): cues for use of UEs and anterior wt shift with sit-stand.  step-by-step through pivot and use of armrests and control descent.   Ambulation/Gait Ambulation/Gait: No Stairs: No Wheelchair Mobility Wheelchair Mobility: No  Posture/Postural Control Posture/Postural Control: No significant limitations Balance Balance Assessed: No Exercise    End of Session PT - End of Session Equipment Utilized During Treatment: Gait belt Activity Tolerance: Patient limited by fatigue;Patient limited by pain Patient left: in chair;with call bell in reach;with family/visitor present Nurse Communication: Mobility status for transfers General Behavior During Session: [facility] for tasks performed Cognition: [facility] for tasks performed  Catarina Hartshorn, [place] 07/14/2011, 12:18 PM

## 2011-07-14 NOTE — Progress Notes (Addendum)
[facility] [place]  Progress Note Bypass Surgery  Date of Surgery: 07/13/2011  Procedure(s): AORTA BIFEMORAL BYPASS GRAFT CYSTOSCOPY Surgeon: Surgeon(s): Hanley Ben, MD Theotis Burrow, MD  1 Day Post-Op  History of Present Illness  Bobby Vega is a 77 y.o. male who is S/P Procedure(s): AORTA BIFEMORAL BYPASS GRAFT   The patient's pre-op symptoms ofThe patient has severe symptoms in his right leg. He can walk approximately 20 feet before he does dead. This is been going on for 2 years but has gotten significantly worse. He also wakes up approximately 5 times at night to hang his leg over the side of the bed.   are Improved . Patients pain is well controlled.                Imaging: Dg Chest 2 View  07/12/2011  *RADIOLOGY REPORT*  Clinical Data: Aorta bifemoral bypass graft surgery preop evaluation.  Hypertension and smoker  CHEST - 2 VIEW  Comparison: None.  Findings: Heart size and vascularity normal.  Lungs are clear without infiltrate or effusion.  No mass lesion is identified. Degenerative changes and spurring in the thoracic spine.  IMPRESSION: No active cardiopulmonary disease.  Original Report Authenticated By: Truett Perna, M.D.   Dg Chest Portable 1 View  07/14/2011  *RADIOLOGY REPORT*  Clinical Data: Postop aorto bifemoral bypass graft  PORTABLE CHEST - 1 VIEW  Comparison: Portable chest x-ray of 07/13/2011  Findings: The Swan-Ganz catheter and NG tube are unchanged.  Only mild basilar atelectasis is present.  Cardiomegaly is stable.  IMPRESSION: No change in mild basilar atelectasis.  Swan-Ganz catheter is unchanged in position.  Original Report Authenticated By: Joretta Bachelor, M.D.   Dg Chest Portable 1 View  07/13/2011  *RADIOLOGY REPORT*  Clinical Data: Postoperative for aortobifemoral bypass.  PORTABLE CHEST - 1 VIEW  Comparison: 07/11/2001  Findings: The right internal jugular Swan-Ganz catheters present with tip projecting of the main  pulmonary artery.  A nasogastric tube is present with tip in the stomach.  Airway thickening is present bilaterally.  Heart size is at the upper normal range.  IMPRESSION:  1. Airway thickening may reflect bronchitis or reactive airways disease.  No airspace opacity is identified to suggest bacterial pneumonia pattern. 2.  Swan-Ganz catheter tip:  Main pulmonary artery.  No pneumothorax or complicating feature.  Original Report Authenticated By: Carron Curie, M.D.    Significant Diagnostic Studies: CBC    Component Value Date/Time   WBC 15.5* 07/14/2011 0400   RBC 3.48* 07/14/2011 0400   HGB 10.7* 07/14/2011 0400   HCT 32.2* 07/14/2011 0400   PLT 128* 07/14/2011 0400   MCV 92.5 07/14/2011 0400   MCH 30.7 07/14/2011 0400   MCHC 33.2 07/14/2011 0400   RDW 15.8* 07/14/2011 0400   LYMPHSABS 3.0 07/12/2011 1203   MONOABS 0.5 07/12/2011 1203   EOSABS 0.3 07/12/2011 1203   BASOSABS 0.1 07/12/2011 1203    BMET    Component Value Date/Time   NA 133* 07/14/2011 0400   K 6.0* 07/14/2011 0400   CL 102 07/14/2011 0400   CO2 23 07/14/2011 0400   GLUCOSE 142* 07/14/2011 0400   BUN 21 07/14/2011 0400   CREATININE 1.77* 07/14/2011 0400   CALCIUM 8.0* 07/14/2011 0400   GFRNONAA 36* 07/14/2011 0400   GFRAA 42* 07/14/2011 0400    COAG Lab Results  Component Value Date   INR 1.25 07/13/2011   INR 1.13 07/12/2011   No results found for  this basename: PTT    Physical Examination  BP Readings from Last 3 Encounters:  07/14/11 155/65  07/14/11 155/65  07/12/11 187/93   Temp Readings from Last 3 Encounters:  07/14/11 100 F (37.8 C)   07/14/11 100 F (37.8 C)   07/12/11 97.2 F (36.2 C)    SpO2 Readings from Last 3 Encounters:  07/14/11 100%  07/14/11 100%  07/12/11 96%   Pulse Readings from Last 3 Encounters:  07/14/11 84  07/13/11 74  07/14/11 84    Pt is A&O x 3 Bil groin and abdominal incisions healing well. : Incision/s is/are clean, dry, intact or clean,dry.intact, and  clean,  dry, intact or healing without hematoma, erythema or drainage Limb is warm; with good color  Bilateral Dorsalis Pedis pulse is biphasic by Doppler and palpable BilateralPosterior tibial pulse is  biphasic by Doppler and palpable  Assessment: Pt. Doing well Post-op pain is controlled Wounds are clean, dry, intact or healing well Bypass is open and extremities are well perfused  Plan: PT/OT for ambulation Continue wound care as ordered Pending abdominal xray due to no NG tube out put. K+ was 6.0 changed IV fluids to D5 without potassium.  [name] [id number] 07/14/2011 9:18 AM   Pt had HTN post-op HTN requiring short term NTG iv

## 2011-07-15 MED ORDER — BIOTENE DRY MOUTH MT LIQD
15.0000 mL | Freq: Four times a day (QID) | OROMUCOSAL | Status: DC
  Administered 2011-07-15 – 2011-07-25 (×33): 15 mL via OROMUCOSAL

## 2011-07-15 MED ORDER — BIOTENE DRY MOUTH MT LIQD: 15.0000 mL | Freq: Two times a day (BID) | OROMUCOSAL | Status: DC

## 2011-07-15 MED ORDER — BIOTENE DRY MOUTH MT LIQD
15.0000 mL | Freq: Two times a day (BID) | OROMUCOSAL | Status: DC
  Administered 2011-07-15: 15 mL via OROMUCOSAL

## 2011-07-15 MED ORDER — CHLORHEXIDINE GLUCONATE CLOTH 2 % EX PADS
6.0000 | MEDICATED_PAD | Freq: Every day | CUTANEOUS | Status: AC
  Administered 2011-07-15 – 2011-07-19 (×5): 6 via TOPICAL

## 2011-07-15 MED ORDER — BISACODYL 10 MG RE SUPP
10.0000 mg | Freq: Every day | RECTAL | Status: DC | PRN
  Filled 2011-07-15 (×2): qty 1

## 2011-07-15 MED ORDER — MUPIROCIN 2 % EX OINT
1.0000 "application " | TOPICAL_OINTMENT | Freq: Two times a day (BID) | CUTANEOUS | Status: AC
  Administered 2011-07-15 – 2011-07-19 (×10): 1 via NASAL
  Filled 2011-07-15: qty 22

## 2011-07-15 MED ORDER — CHLORHEXIDINE GLUCONATE 0.12 % MT SOLN
15.0000 mL | Freq: Two times a day (BID) | OROMUCOSAL | Status: DC
  Administered 2011-07-15 – 2011-07-25 (×17): 15 mL via OROMUCOSAL
  Filled 2011-07-15 (×21): qty 15

## 2011-07-15 NOTE — Progress Notes (Signed)
Patient ID: Bobby Vega, male   DOB: [birth year], 77 y.o.   MRN: [id number] Vascular Surgery Progress Note  Subjective: Daily status post aortobifemoral bypass graft for occlusive disease. Patient denies pain and lower extremities. Has had no bowel movement. He is getting out of bed and chair daily. Denies chest pain or shortness of breath.  Objective:  Filed Vitals:   07/15/11 0700  BP: 157/90  Pulse: 117  Temp:   Resp: 19    Gen. well-developed well-nourished male in no apparent stress alert and oriented x3 sitting in chair Chest no rhonchi or wheezing Abdomen distended but nontender midline incision healing nicely Anal wounds healing well Lower extremities well perfused no palpable pulses    Labs:  Lab 07/14/11 0400 07/13/11 1715 07/12/11 1203  CREATININE 1.77* 1.70* 1.94*    Lab 07/14/11 0400 07/13/11 1715 07/13/11 1440 07/12/11 1203  NA 133* 135 137 --  K 6.0* 4.3 4.7 --  CL 102 104 -- 109  CO2 23 22 -- 19  BUN 21 22 -- 22  CREATININE 1.77* 1.70* -- 1.94*  LABGLOM -- -- -- --  GLUCOSE 142* -- -- --  CALCIUM 8.0* 8.1* -- 9.0    Lab 07/14/11 0400 07/13/11 1715 07/13/11 1440 07/12/11 1203  WBC 15.5* 11.2* -- 7.4  HGB 10.7* 11.0* 9.9* --  HCT 32.2* 33.3* 29.0* --  PLT 128* 123* -- 168    Lab 07/13/11 1715 07/12/11 1203  INR 1.25 1.13    I/O last 3 completed shifts: In: 3766.3 [I.V.:3276.3; NG/GT:290; IV Piggyback:200] Out: [id number] [Urine:3615]  Imaging: Dg Chest Portable 1 View  07/14/2011  *RADIOLOGY REPORT*  Clinical Data: Postop aorto bifemoral bypass graft  PORTABLE CHEST - 1 VIEW  Comparison: Portable chest x-ray of 07/13/2011  Findings: The Swan-Ganz catheter and NG tube are unchanged.  Only mild basilar atelectasis is present.  Cardiomegaly is stable.  IMPRESSION: No change in mild basilar atelectasis.  Swan-Ganz catheter is unchanged in position.  Original Report Authenticated By: Joretta Bachelor, M.D.   Dg Chest Portable 1 View  07/13/2011  *RADIOLOGY  REPORT*  Clinical Data: Postoperative for aortobifemoral bypass.  PORTABLE CHEST - 1 VIEW  Comparison: 07/11/2001  Findings: The right internal jugular Swan-Ganz catheters present with tip projecting of the main pulmonary artery.  A nasogastric tube is present with tip in the stomach.  Airway thickening is present bilaterally.  Heart size is at the upper normal range.  IMPRESSION:  1. Airway thickening may reflect bronchitis or reactive airways disease.  No airspace opacity is identified to suggest bacterial pneumonia pattern. 2.  Swan-Ganz catheter tip:  Main pulmonary artery.  No pneumothorax or complicating feature.  Original Report Authenticated By: Carron Curie, M.D.   Dg Abd Portable 1v  07/14/2011  *RADIOLOGY REPORT*  Clinical Data: NG tube placement  PORTABLE ABDOMEN - 1 VIEW  Comparison: None.  Findings: A supine film of the abdomen shows the tip of the feeding tube to coil within the gastric fundus.  The bowel gas pattern is nonspecific.  There are degenerative changes in the lower lumbar spine.  IMPRESSION: NG tube coiled in gastric fundus.  Original Report Authenticated By: Joretta Bachelor, M.D.    Assessment/Plan:  POD #2  LOS: 2 days  s/p Procedure(s): AORTA BIFEMORAL BYPASS GRAFT CYSTOSCOPY  Plan #1 DC nasogastric tube and keep patient n.p.o. #2 continue mobilization with patient out of bed and chair and beginning to ambulate #3 Dulcolax suppository #4 watch potassium-6.0 yesterday-creatinine stable with excellent  urinary output #5 maintained in the surgical ICU for present time   Tinnie Gens, MD 07/15/2011 7:29 AM

## 2011-07-16 LAB — BASIC METABOLIC PANEL
BUN: 13 mg/dL (ref 6–23)
CO2: 25 mEq/L (ref 19–32)
Chloride: 102 mEq/L (ref 96–112)
Glucose, Bld: 141 mg/dL — ABNORMAL HIGH (ref 70–99)
Potassium: 3.5 mEq/L (ref 3.5–5.1)

## 2011-07-16 LAB — CBC
HCT: 31.2 % — ABNORMAL LOW (ref 39.0–52.0)
Hemoglobin: 10.4 g/dL — ABNORMAL LOW (ref 13.0–17.0)
MCV: 94.3 fL (ref 78.0–100.0)
RBC: 3.31 MIL/uL — ABNORMAL LOW (ref 4.22–5.81)
WBC: 14.3 10*3/uL — ABNORMAL HIGH (ref 4.0–10.5)

## 2011-07-16 MED ORDER — BISACODYL 10 MG RE SUPP
10.0000 mg | Freq: Every day | RECTAL | Status: DC | PRN
  Administered 2011-07-16 – 2011-07-21 (×3): 10 mg via RECTAL
  Filled 2011-07-16 (×2): qty 1

## 2011-07-16 NOTE — Progress Notes (Signed)
Patient ID: Bobby Vega, male   DOB: February 19, 1935, 77 y.o.   MRN: [id number] Vascular Surgery Progress Note  Subjective: 3 days post aorto bifemoral bypass graft No bowel movements or flatus Denies any lower extremity the pain Is sitting in chair and beginning to stand   Objective:  Filed Vitals:   07/16/11 0721  BP:   Pulse:   Temp: 98.9 F (37.2 C)  Resp:     General alert and oriented x3 in no apparent distress Lungs no rhonchi or wheezing Cardiovascular regular and no murmurs Abdomen remains distended but non-tender-midline incision healing well Inguinal wounds look good with 3+ pulses and well perfused lower extremities   Labs:  Lab 07/16/11 0412 07/14/11 0400 07/13/11 1715  CREATININE 1.66* 1.77* 1.70*    Lab 07/16/11 0412 07/14/11 0400 07/13/11 1715  NA 134* 133* 135  K 3.5 6.0* 4.3  CL 102 102 104  CO2 25 23 22   BUN 13 21 22   CREATININE 1.66* 1.77* 1.70*  LABGLOM -- -- --  GLUCOSE 141* -- --  CALCIUM 8.2* 8.0* 8.1*    Lab 07/16/11 0412 07/14/11 0400 07/13/11 1715  WBC 14.3* 15.5* 11.2*  HGB 10.4* 10.7* 11.0*  HCT 31.2* 32.2* 33.3*  PLT 108* 128* 123*    Lab 07/13/11 1715 07/12/11 1203  INR 1.25 1.13    I/O last 3 completed shifts: In: 3835.5 [I.V.:3595.5; NG/GT:90; IV Piggyback:150] Out: 3080 [Urine:3080]  Imaging: Dg Abd Portable 1v  07/14/2011  *RADIOLOGY REPORT*  Clinical Data: NG tube placement  PORTABLE ABDOMEN - 1 VIEW  Comparison: None.  Findings: A supine film of the abdomen shows the tip of the feeding tube to coil within the gastric fundus.  The bowel gas pattern is nonspecific.  There are degenerative changes in the lower lumbar spine.  IMPRESSION: NG tube coiled in gastric fundus.  Original Report Authenticated By: Joretta Bachelor, M.D.    Assessment/Plan:  POD #3  LOS: 3 days  s/p Procedure(s): AORTA BIFEMORAL BYPASS GRAFT CYSTOSCOPY  Doing well post aorto bifemoral bypass graft for occlusive disease Plan-#1 Dulcolax  suppository today #2 ambulate with physical therapy #3 continue n.p.o. except sips which medicines #4 transfer 2000 in a.m.   Tinnie Gens, MD 07/16/2011 8:27 AM

## 2011-07-17 MED ORDER — FUROSEMIDE 10 MG/ML IJ SOLN
20.0000 mg | Freq: Once | INTRAMUSCULAR | Status: AC
  Administered 2011-07-17: 20 mg via INTRAVENOUS
  Filled 2011-07-17: qty 2

## 2011-07-17 MED ORDER — DEXTROSE-NACL 5-0.45 % IV SOLN
INTRAVENOUS | Status: DC
  Administered 2011-07-17 – 2011-07-18 (×2): via INTRAVENOUS

## 2011-07-17 MED ORDER — FAMOTIDINE 20 MG PO TABS
20.0000 mg | ORAL_TABLET | Freq: Two times a day (BID) | ORAL | Status: DC
  Administered 2011-07-17 – 2011-07-25 (×16): 20 mg via ORAL
  Filled 2011-07-17 (×17): qty 1

## 2011-07-17 NOTE — Procedures (Unsigned)
CAROTID DUPLEX EXAM  INDICATION:  Preop.  HISTORY: Diabetes:  Borderline. Cardiac:  No. Hypertension:  Yes. Smoking:  Yes. Previous Surgery:  No. CV History:  Currently asymptomatic. Amaurosis Fugax No, Paresthesias No, Hemiparesis No.                                      RIGHT             LEFT Brachial systolic pressure:         188               192 Brachial Doppler waveforms:         Normal            Normal Vertebral direction of flow:        Antegrade         Antegrade DUPLEX VELOCITIES (cm/sec) CCA peak systolic                   91                95 ECA peak systolic                   50                42 ICA peak systolic                   84                [id number] ICA end diastolic                   30                34 PLAQUE MORPHOLOGY:                  Heterogenous      Heterogenous PLAQUE AMOUNT:                      Mild              Mild PLAQUE LOCATION:                    CCA               CCA  IMPRESSION:  No hemodynamically significant stenoses of the bilateral internal carotid arteries noted.  ___________________________________________ V. Leia Alf, MD  CH/MEDQ  D:  07/12/2011  T:  07/12/2011  Job:  [id number]

## 2011-07-17 NOTE — Progress Notes (Signed)
[facility] [place]  Subjective  - POD #4   Pain ok ? Flatus, no stool No rest pain    Physical Exam:  abd distended but soft Non-palpable pedal pulses, but warm Minimal edema       Assessment/Plan:  POD #4  Decrease IVF, add lasix Transfer to floor Mobilize Keep npo for now  Lorry Anastasi IV, V. WELLS 07/17/2011 7:36 AM --  Filed Vitals:   07/17/11 0700  BP: 173/81  Pulse: 87  Temp:   Resp: 11    Intake/Output Summary (Last 24 hours) at 07/17/11 0736 Last data filed at 07/17/11 0700  Gross per 24 hour  Intake 2500.59 ml  Output   2125 ml  Net 375.59 ml     Laboratory CBC    Component Value Date/Time   WBC 14.3* 07/16/2011 0412   HGB 10.4* 07/16/2011 0412   HCT 31.2* 07/16/2011 0412   PLT 108* 07/16/2011 0412    BMET    Component Value Date/Time   NA 134* 07/16/2011 0412   K 3.5 07/16/2011 0412   CL 102 07/16/2011 0412   CO2 25 07/16/2011 0412   GLUCOSE 141* 07/16/2011 0412   BUN 13 07/16/2011 0412   CREATININE 1.66* 07/16/2011 0412   CALCIUM 8.2* 07/16/2011 0412   GFRNONAA 39* 07/16/2011 0412   GFRAA 45* 07/16/2011 0412    COAG Lab Results  Component Value Date   INR 1.25 07/13/2011   INR 1.13 07/12/2011   No results found for this basename: PTT    Antibiotics Anti-infectives     Start     Dose/Rate Route Frequency Ordered Stop   07/13/11 2200   cefUROXime (ZINACEF) 1.5 g in dextrose 5 % 50 mL IVPB        1.5 g 100 mL/hr over 30 Minutes Intravenous Every 12 hours 07/13/11 2038 07/14/11 1122   07/13/11 0600   cefUROXime (ZINACEF) 1.5 g in dextrose 5 % 50 mL IVPB        1.5 g 100 mL/hr over 30 Minutes Intravenous On call to O.R. 07/12/11 1453 07/13/11 0940           V. Leia Alf, M.D. [facility] [place] Office: [phone] Pager:  [phone]

## 2011-07-17 NOTE — Procedures (Unsigned)
BYPASS GRAFT EVALUATION  INDICATION:  Rest pain and claudication.  HISTORY: Diabetes:  Yes. Cardiac:  No. Hypertension:  Yes. Smoking:  Currently smokes cigars. Previous Surgery:  None.  SINGLE LEVEL ARTERIAL EXAM                              RIGHT              LEFT Brachial:                    198                194 Anterior tibial:             Absent             94 Posterior tibial:            42                 114 Peroneal: Ankle/brachial index:        0.21               0.58  PREVIOUS ABI:  Date:  RIGHT:  LEFT:  LOWER EXTREMITY BYPASS GRAFT DUPLEX EXAM:  DUPLEX: 1. Heavy plaque formations visualized in the right and left lower     extremity arterial systems. 2. Minimal flow is noted in the right common femoral artery with a     velocity of 13 cm/s noted.  There is an area of high stenosis     visualized in the right proximal superficial femoral artery with a     velocity of 288 cm/s.  No flow is visualized in the mid to distal     superficial femoral artery of the right lower extremity. 3. The left common femoral artery is noted with a velocity of 198 cm/s     with retrograde flow observed in the mid superficial femoral     artery.  IMPRESSION: 1. Right ankle brachial index is suggestive of critical arterial     disease. 2. Left ankle brachial index is suggestive of moderate to severe     arterial disease. 3. Heavy plaque formations noted throughout the right and left lower     extremity arterial systems with velocity measurements shown on the     following worksheet.  ___________________________________________ V. Leia Alf, MD  EM/MEDQ  D:  06/26/2011  T:  06/26/2011  Job:  [id number]

## 2011-07-17 NOTE — Progress Notes (Signed)
Physical Therapy Treatment Patient Details Name: Bobby Vega MRN: [id number] DOB: [birth year] Today's Date: 07/17/2011  PT Assessment/Plan  PT - Assessment/Plan Comments on Treatment Session: pt presents s/p bifemoral bypass.  pt needs encouargement for increasing mobility, but improved today.   PT Plan: Discharge plan remains appropriate PT Frequency: Min 3X/week Recommendations for Other Services: Rehab consult Follow Up Recommendations: Inpatient Rehab Equipment Recommended: Defer to next venue PT Goals  Acute Rehab PT Goals PT Goal: Rolling Supine to Left Side - Progress: Not met PT Goal: Supine/Side to Sit - Progress: Not met PT Goal: Sit to Supine/Side - Progress: Progressing toward goal PT Goal: Sit to Stand - Progress: Progressing toward goal PT Goal: Ambulate - Progress: Progressing toward goal  PT Treatment Precautions/Restrictions  Precautions Precautions: Fall Restrictions Weight Bearing Restrictions: No Mobility (including Balance) Bed Mobility Sit to Sidelying Left: 1: +2 Total assist;Patient percentage (comment) (pt 40%) Sit to Sidelying Left Details (indicate cue type and reason): A for Bil LEs and trunk  Transfers Transfers: Yes Sit to Stand: 1: +2 Total assist;Patient percentage (comment);With upper extremity assist;From chair/3-in-1 (pt 70%) Sit to Stand Details (indicate cue type and reason): cues for use of UEs and anterior wt shift Stand to Sit: 1: +2 Total assist;Patient percentage (comment);With upper extremity assist;To bed (pt 70%) Stand to Sit Details: cues for use of UEs, control descent, get closer to bed prior to sitting.   Ambulation/Gait Ambulation/Gait: Yes Ambulation/Gait Assistance: 4: Min assist Ambulation/Gait Assistance Details (indicate cue type and reason): cues for encouragement and upright posture Ambulation Distance (Feet): 200 Feet Assistive device:  (W/C to A with carrying equip.  ) Gait Pattern: Decreased stride length;Trunk  flexed Stairs: No Wheelchair Mobility Wheelchair Mobility: No  Posture/Postural Control Posture/Postural Control: No significant limitations Exercise    End of Session PT - End of Session Equipment Utilized During Treatment: Gait belt Activity Tolerance: Patient limited by fatigue;Patient limited by pain Patient left: in bed;with call bell in reach Nurse Communication: Mobility status for transfers;Mobility status for ambulation General Behavior During Session: [facility] for tasks performed Cognition: [facility] for tasks performed  Bobby Vega, Bobby Vega 07/17/2011, 12:55 PM

## 2011-07-17 NOTE — Progress Notes (Signed)
Occupational Therapy Treatment Patient Details Name: Bobby Vega MRN: [id number] DOB: May 14, 1935 Today's Date: 07/17/2011  OT Assessment/Plan OT Assessment/Plan OT Plan: Discharge plan remains appropriate Recommendations for Other Services: Rehab consult Follow Up Recommendations: Inpatient Rehab Equipment Recommended: Defer to next venue OT Goals ADL Goals Pt Will Perform Grooming: with modified independence;Standing at sink ADL Goal: Grooming - Progress: Updated due to goal met ADL Goal: Lower Body Dressing - Progress: Progressing toward goals ADL Goal: Toilet Transfer - Progress: Progressing toward goals  OT Treatment Precautions/Restrictions  Precautions Precautions: Fall   ADL ADL Grooming: Performed;Wash/dry face;Set up (Min guard A) Where Assessed - Grooming: Standing at sink Lower Body Dressing: +1 Total assistance Lower Body Dressing Details (indicate cue type and reason): With donning bil socks pt c/o pain with attempts to bend at hips Where Assessed - Lower Body Dressing: Sitting, bed Toilet Transfer: Simulated;Minimal assistance (+2 for lines) Toilet Transfer Details (indicate cue type and reason): VC for increase in speed and assist to maneuver around turns Toilet Transfer Method: Ambulating Toileting - Hygiene: Simulated Mobility  Bed Mobility Sit to Sidelying Left: 1: +2 Total assist Sit to Sidelying Left Details (indicate cue type and reason): pt=40%. assist to bring bilateral LE's into bed and control descent of truck down to bed Transfers Sit to Stand: 1: +2 Total assist;From chair/3-in-1 (pt=70% )  End of Session OT - End of Session Equipment Utilized During Treatment: Gait belt Activity Tolerance: Patient tolerated treatment well Patient left: in bed;with call bell in reach;with bed alarm set Nurse Communication: Mobility status for transfers;Mobility status for ambulation General Behavior During Session: [facility] for tasks performed Cognition: [facility]  for tasks performed  Bobby Vega  07/17/2011, 12:35 PM

## 2011-07-18 ENCOUNTER — Encounter (HOSPITAL_COMMUNITY): Payer: Self-pay | Admitting: Surgery

## 2011-07-18 LAB — BASIC METABOLIC PANEL
CO2: 25 mEq/L (ref 19–32)
CO2: 26 mEq/L (ref 19–32)
Calcium: 9.1 mg/dL (ref 8.4–10.5)
Chloride: 100 mEq/L (ref 96–112)
Chloride: 99 mEq/L (ref 96–112)
Creatinine, Ser: 1.55 mg/dL — ABNORMAL HIGH (ref 0.50–1.35)
Glucose, Bld: 161 mg/dL — ABNORMAL HIGH (ref 70–99)
Potassium: 2.9 mEq/L — ABNORMAL LOW (ref 3.5–5.1)
Potassium: 3.1 mEq/L — ABNORMAL LOW (ref 3.5–5.1)
Sodium: 135 mEq/L (ref 135–145)

## 2011-07-18 LAB — CBC
MCV: 94 fL (ref 78.0–100.0)
Platelets: 133 10*3/uL — ABNORMAL LOW (ref 150–400)
RBC: 3.33 MIL/uL — ABNORMAL LOW (ref 4.22–5.81)
WBC: 12.8 10*3/uL — ABNORMAL HIGH (ref 4.0–10.5)

## 2011-07-18 MED ORDER — POTASSIUM CHLORIDE 10 MEQ/50ML IV SOLN
10.0000 meq | INTRAVENOUS | Status: AC | PRN
  Administered 2011-07-18 (×4): 10 meq via INTRAVENOUS
  Filled 2011-07-18 (×3): qty 50

## 2011-07-18 MED ORDER — POTASSIUM CHLORIDE 10 MEQ/50ML IV SOLN
INTRAVENOUS | Status: AC
  Administered 2011-07-18: 50 meq via INTRAVENOUS
  Filled 2011-07-18: qty 50

## 2011-07-18 MED ORDER — POTASSIUM CHLORIDE 10 MEQ/50ML IV SOLN
INTRAVENOUS | Status: AC
  Administered 2011-07-18: 10 meq via INTRAVENOUS
  Filled 2011-07-18: qty 50

## 2011-07-18 MED ORDER — POTASSIUM CHLORIDE 10 MEQ/50ML IV SOLN
10.0000 meq | INTRAVENOUS | Status: DC | PRN
  Administered 2011-07-18: 50 meq via INTRAVENOUS

## 2011-07-18 MED ORDER — POTASSIUM CHLORIDE 10 MEQ/50ML IV SOLN
10.0000 meq | Freq: Once | INTRAVENOUS | Status: AC
  Administered 2011-07-18: 10 meq via INTRAVENOUS

## 2011-07-18 MED ORDER — POTASSIUM CHLORIDE 10 MEQ/50ML IV SOLN
INTRAVENOUS | Status: AC
  Filled 2011-07-18: qty 50

## 2011-07-18 MED ORDER — FUROSEMIDE 10 MG/ML IJ SOLN
20.0000 mg | Freq: Once | INTRAMUSCULAR | Status: AC
  Administered 2011-07-18: 20 mg via INTRAVENOUS
  Filled 2011-07-18: qty 2

## 2011-07-18 NOTE — Progress Notes (Signed)
Utilization review completed. Rozanna Boer, RN, BSN. 07/18/11

## 2011-07-18 NOTE — Progress Notes (Signed)
[facility] [place]  Post-op  Intra-abdominal Surgery note  Date of Surgery: 07/13/2011 Surgeon: Surgeon(s): Hanley Ben, MD Theotis Burrow, MD POD: 5 Days Post-Op Procedure(s): AORTA BIFEMORAL BYPASS GRAFT CYSTOSCOPY  History of Present Illness  Bobby Vega is a 77 y.o. male who is  up s/p Procedure(s): AORTA BIFEMORAL BYPASS GRAFT CYSTOSCOPY Pt is doing well. denies incisional pain; denies nausea/vomiting; denies diarrhea. has had flatus;has had BM  IMAGING: No results found.  Significant Diagnostic Studies: CBC    Component Value Date/Time   WBC 12.8* 07/18/2011 0335   RBC 3.33* 07/18/2011 0335   HGB 10.2* 07/18/2011 0335   HCT 31.3* 07/18/2011 0335   PLT 133* 07/18/2011 0335   MCV 94.0 07/18/2011 0335   MCH 30.6 07/18/2011 0335   MCHC 32.6 07/18/2011 0335   RDW 15.4 07/18/2011 0335   LYMPHSABS 3.0 07/12/2011 1203   MONOABS 0.5 07/12/2011 1203   EOSABS 0.3 07/12/2011 1203   BASOSABS 0.1 07/12/2011 1203    BMET    Component Value Date/Time   NA 135 07/18/2011 0335   K 2.9* 07/18/2011 0335   CL 100 07/18/2011 0335   CO2 25 07/18/2011 0335   GLUCOSE 125* 07/18/2011 0335   BUN 16 07/18/2011 0335   CREATININE 1.55* 07/18/2011 0335   CALCIUM 8.8 07/18/2011 0335   GFRNONAA 42* 07/18/2011 0335   GFRAA 49* 07/18/2011 0335    COAG Lab Results  Component Value Date   INR 1.25 07/13/2011   INR 1.13 07/12/2011   No results found for this basename: PTT    I/O last 3 completed shifts: In: 2701.8 [I.V.:2449.8; IV Piggyback:252] Out: [id number]    Physical Examination BP Readings from Last 3 Encounters:  07/18/11 159/83  07/18/11 159/83  07/12/11 187/93   Temp Readings from Last 3 Encounters:  07/18/11 97.6 F (36.4 C) Oral  07/18/11 97.6 F (36.4 C) Oral  07/12/11 97.2 F (36.2 C)    SpO2 Readings from Last 3 Encounters:  07/18/11 96%  07/18/11 96%  07/12/11 96%   Pulse Readings from Last 3 Encounters:  07/18/11 89  07/13/11 74    07/18/11 89    General: A&O x 3, WDWN male in NAD Pulmonary: normal non-labored breathing , without Rales, rhonchi,  wheezing Cardiac: Heart rate : regular ,  Abdomen:normal active bowel sounds Abdominal wound:clean, dry, intact  Neurologic: A&O X 3; Appropriate Affect ; SENSATION: normal; MOTOR FUNCTION:  moving all extremities equally. Speech is fluent/normal  Vascular Exam:BLE warm and well perfused Extremities without ischemic changes, no Gangrene , no cellulitis; no open wounds;  Bil DP and PT doppler present.                   Assessment: Bobby Vega is a 77 y.o. male who is 5 Days Post-Op Procedure(s): AORTA BIFEMORAL BYPASS GRAFT Pending floor transfer.  Plan:   Pt, transfer to 2000 pending.  [name] [id number] 07/18/2011 7:41 AM  Agree, will start liquid diet Lasix again today, replace hypokalemia OOB walking 3x/day PT/OT eval  Wells Brabham

## 2011-07-19 ENCOUNTER — Encounter: Payer: Self-pay | Admitting: Surgery

## 2011-07-19 MED ORDER — PANTOPRAZOLE SODIUM 40 MG PO TBEC
40.0000 mg | DELAYED_RELEASE_TABLET | Freq: Every day | ORAL | Status: DC
  Administered 2011-07-19 – 2011-07-25 (×7): 40 mg via ORAL
  Filled 2011-07-19 (×7): qty 1

## 2011-07-19 MED ORDER — DUTASTERIDE 0.5 MG PO CAPS
0.5000 mg | ORAL_CAPSULE | Freq: Every day | ORAL | Status: DC
  Administered 2011-07-19 – 2011-07-25 (×7): 0.5 mg via ORAL
  Filled 2011-07-19 (×7): qty 1

## 2011-07-19 MED ORDER — LORATADINE 10 MG PO TABS
10.0000 mg | ORAL_TABLET | Freq: Every day | ORAL | Status: DC
  Administered 2011-07-19 – 2011-07-25 (×7): 10 mg via ORAL
  Filled 2011-07-19 (×7): qty 1

## 2011-07-19 MED ORDER — ASPIRIN EC 81 MG PO TBEC
81.0000 mg | DELAYED_RELEASE_TABLET | Freq: Every day | ORAL | Status: DC
  Administered 2011-07-19 – 2011-07-25 (×7): 81 mg via ORAL
  Filled 2011-07-19 (×7): qty 1

## 2011-07-19 MED ORDER — PREGABALIN 50 MG PO CAPS
75.0000 mg | ORAL_CAPSULE | Freq: Three times a day (TID) | ORAL | Status: DC
  Administered 2011-07-19 – 2011-07-25 (×19): 75 mg via ORAL
  Filled 2011-07-19 (×10): qty 1
  Filled 2011-07-19: qty 3
  Filled 2011-07-19 (×7): qty 1
  Filled 2011-07-19: qty 3
  Filled 2011-07-19: qty 1

## 2011-07-19 MED ORDER — LOSARTAN POTASSIUM 50 MG PO TABS
50.0000 mg | ORAL_TABLET | Freq: Every day | ORAL | Status: DC
  Administered 2011-07-19 – 2011-07-25 (×7): 50 mg via ORAL
  Filled 2011-07-19 (×7): qty 1

## 2011-07-19 MED ORDER — CYANOCOBALAMIN 250 MCG PO TABS
250.0000 ug | ORAL_TABLET | Freq: Every day | ORAL | Status: DC
  Administered 2011-07-19 – 2011-07-25 (×7): 250 ug via ORAL
  Filled 2011-07-19 (×7): qty 1

## 2011-07-19 MED ORDER — SIMVASTATIN 20 MG PO TABS
20.0000 mg | ORAL_TABLET | Freq: Every day | ORAL | Status: DC
  Administered 2011-07-19 – 2011-07-24 (×6): 20 mg via ORAL
  Filled 2011-07-19 (×7): qty 1

## 2011-07-19 NOTE — Progress Notes (Signed)
[facility] Progress Note  07/19/2011 7:41 AM POD 6  C/o pain this am with catheter.  Tm 99.5 now 98.2  150s-170s sys  95%RA Filed Vitals:   07/19/11 0355  BP: 152/83  Pulse: 98  Temp: 98.2 F (36.8 C)  Resp: 9   Cardiac:  NSR Lungs:  CTAB Abdomen:  +BS; + flatus; no BM past 24 hrs NT to palpation Incisions:  Abdominal and groin incisions are c/d/i without drainage. Extremities:  Warm and well perfused. GU: foley cath in place.  CBC    Component Value Date/Time   WBC 12.8* 07/18/2011 0335   RBC 3.33* 07/18/2011 0335   HGB 10.2* 07/18/2011 0335   HCT 31.3* 07/18/2011 0335   PLT 133* 07/18/2011 0335   MCV 94.0 07/18/2011 0335   MCH 30.6 07/18/2011 0335   MCHC 32.6 07/18/2011 0335   RDW 15.4 07/18/2011 0335   LYMPHSABS 3.0 07/12/2011 1203   MONOABS 0.5 07/12/2011 1203   EOSABS 0.3 07/12/2011 1203   BASOSABS 0.1 07/12/2011 1203    BMET    Component Value Date/Time   NA 135 07/18/2011 1548   K 3.1* 07/18/2011 1548   CL 99 07/18/2011 1548   CO2 26 07/18/2011 1548   GLUCOSE 161* 07/18/2011 1548   BUN 18 07/18/2011 1548   CREATININE 1.52* 07/18/2011 1548   CALCIUM 9.1 07/18/2011 1548   GFRNONAA 43* 07/18/2011 1548   GFRAA 50* 07/18/2011 1548    INR    Component Value Date/Time   INR 1.25 07/13/2011 1715     Intake/Output Summary (Last 24 hours) at 07/19/11 0741 Last data filed at 07/19/11 0300  Gross per 24 hour  Intake 778.98 ml  Output   2505 ml  Net -1726.02 ml     Assessment/Plan:  77 y.o. male is s/p AORTA BIFEMORAL BYPASS GRAFT POD 6 -will restart po home meds -Cr improved yesterday with good UOP -continue to keep foley per urology -ck labs in am. -continue PT/OT   Evorn Gong, PA-C [facility] [phone] 07/19/2011 7:41 AM

## 2011-07-19 NOTE — Progress Notes (Signed)
Pt smokes 3 Black and Mild cigars per day. He is in action stage and says he plans to quit cold [place]. Advised and encouraged pt to quit. Referred to 1-800 quit now for f/u and support. Discussed oral fixation substitutes, second hand smoke and in home smoking policy. Reviewed and gave pt Written education/contact information.

## 2011-07-19 NOTE — Progress Notes (Signed)
Physical Therapy Treatment Patient Details Name: Bobby Vega MRN: [id number] DOB: January 01, 1935 Today's Date: 07/19/2011  PT Assessment/Plan  PT - Assessment/Plan Comments on Treatment Session: Pt needs constant encouragement for mobility.  Pt c/o pain in genitalia area and noticeable bleeding.  RN notified PT Plan: Discharge plan remains appropriate PT Frequency: Min 3X/week Recommendations for Other Services: Rehab consult Follow Up Recommendations: Inpatient Rehab Equipment Recommended: Defer to next venue PT Goals  Acute Rehab PT Goals PT Goal Formulation: With patient/family Time For Goal Achievement: 2 weeks Pt will Roll Supine to Left Side: Independently PT Goal: Rolling Supine to Left Side - Progress: Progressing toward goal Pt will go Supine/Side to Sit: Independently PT Goal: Supine/Side to Sit - Progress: Progressing toward goal Pt will go Sit to Supine/Side: Independently PT Goal: Sit to Supine/Side - Progress: Progressing toward goal Pt will go Sit to Stand: with modified independence;with upper extremity assist PT Goal: Sit to Stand - Progress: Progressing toward goal Pt will Ambulate: >150 feet;with modified independence;with rolling walker PT Goal: Ambulate - Progress: Progressing toward goal  PT Treatment Precautions/Restrictions  Precautions Precautions: Fall Restrictions Weight Bearing Restrictions: No Mobility (including Balance) Bed Mobility Bed Mobility: Yes Rolling Right: 4: Min assist Rolling Right Details (indicate cue type and reason): (A) to complete roll with cues for hand placement Right Sidelying to Sit: 4: Min assist;With rails Right Sidelying to Sit Details (indicate cue type and reason): (A) to elevate trunk OOB with  max cues for technique Transfers Transfers: Yes Sit to Stand: 4: Min assist Sit to Stand Details (indicate cue type and reason): (A) to initiate transfer with cues for hand placement Stand to Sit: 4: Min assist;With  armrests;To chair/3-in-1 Stand to Sit Details: (A) to slowly descend to recliner with cues for hand placement Ambulation/Gait Ambulation/Gait: Yes Ambulation/Gait Assistance: 4: Min assist Ambulation/Gait Assistance Details (indicate cue type and reason): Minguard for safety with cues for RW placement and body position within RW.   Ambulation Distance (Feet): 200 Feet Assistive device: Rolling walker Gait Pattern: Decreased stride length;Trunk flexed Stairs: No Wheelchair Mobility Wheelchair Mobility: No  Posture/Postural Control Posture/Postural Control: No significant limitations Balance Balance Assessed: No Exercise    End of Session PT - End of Session Equipment Utilized During Treatment: Gait belt Activity Tolerance: Patient tolerated treatment well Patient left: in chair;with call bell in reach Nurse Communication: Mobility status for transfers;Mobility status for ambulation General Behavior During Session: [facility] for tasks performed Cognition: [facility] for tasks performed  Bobby Vega 07/19/2011, 2:35 PM [phone]

## 2011-07-20 MED ORDER — DEXTROSE-NACL 5-0.45 % IV SOLN: INTRAVENOUS | Status: DC

## 2011-07-20 NOTE — Progress Notes (Signed)
Occupational Therapy Treatment Patient Details Name: Bobby Vega MRN: [id number] DOB: 01-Jan-1935 Today's Date: 07/20/2011  OT Assessment/Plan OT Assessment/Plan OT Plan: Discharge plan remains appropriate OT Frequency: Min 2X/week Recommendations for Other Services: Rehab consult Follow Up Recommendations: Inpatient Rehab Equipment Recommended: Defer to next venue OT Goals Acute Rehab OT Goals OT Goal Formulation: With patient/family Time For Goal Achievement: 2 weeks ADL Goals Pt Will Perform Grooming: with modified independence;Standing at sink ADL Goal: Grooming - Progress: Progressing toward goals Pt Will Perform Lower Body Dressing: with min assist;with supervision;with adaptive equipment;Sit to stand from bed ADL Goal: Lower Body Dressing - Progress: Met Pt Will Transfer to Toilet: with min assist;3-in-1;Stand pivot transfer ADL Goal: Toilet Transfer - Progress: Met  OT Treatment Precautions/Restrictions  Precautions Precautions: Fall Restrictions Weight Bearing Restrictions: No   ADL ADL Grooming: Performed;Wash/dry hands;Wash/dry face;Supervision/safety Where Assessed - Grooming: Standing at sink Lower Body Dressing: Supervision/safety Lower Body Dressing Details (indicate cue type and reason): Instructed to cross opposite foot over knee to decrease pain. Where Assessed - Lower Body Dressing: Sitting, bed;Sit to stand from bed Toilet Transfer: Simulated;Supervision/safety Toilet Transfer Details (indicate cue type and reason): cues to avoid leaving walker Toilet Transfer Method: Ambulating Equipment Used: Rolling walker Ambulation Related to ADLs: Min guard assist for ambulation with RW.   ADL Comments: Pt demonstrates decreased awareness of deficits and safety. Mobility  Bed Mobility Bed Mobility: Yes Rolling Left: 5: Supervision;With rail Left Sidelying to Sit: With rails;5: Supervision Sitting - Scoot to Edge of Bed: 5: Supervision Transfers Transfers:  Yes Sit to Stand: 5: Supervision;From bed Sit to Stand Details (indicate cue type and reason): verbal cues for hand placement Stand to Sit: 5: Supervision  End of Session OT - End of Session Equipment Utilized During Treatment: Gait belt Activity Tolerance: Treatment limited secondary to medical complications (Comment) (pt with HR 120 to 130--activity limited by therapist) Patient left: in chair;with family/visitor present;Other (comment) (nurse tech) Nurse Communication: Other (comment) (pts HR) General Behavior During Session: [facility] for tasks performed Cognition: [facility] for tasks performed  Bobby Vega  07/20/2011, 1:20 PM [phone]

## 2011-07-20 NOTE — Progress Notes (Signed)
[facility] [place]  Subjective - POD #6  Some flatus today  Did well walking yesterday.  Still using PCA  Smear for bowel movement  On heart healthy diet  Physical Exam:  Incisions all look good  abd still slightly distended  Doppler PT signals  Assessment/Plan: POD #6  D/c pca  Saline lock IV  Transfer to floor  D/c cordis  Janine Reller IV, V. WELLS  07/20/2011  8:05 AM  --  Filed Vitals:    07/20/11 0535   BP:  112/65   Pulse:  70   Temp:  98.3 F (36.8 C)   Resp:  16     Intake/Output Summary (Last 24 hours) at 07/20/11 0805 Last data filed at 07/19/11 1800   Gross per 24 hour   Intake  725.96 ml   Output  300 ml   Net  425.96 ml    Laboratory  CBC    Component  Value  Date/Time    WBC  14.0*  07/20/2011 0443    HGB  7.8*  07/20/2011 0443    HCT  24.7*  07/20/2011 0443    PLT  312  07/20/2011 0443    BMET    Component  Value  Date/Time    NA  133*  07/20/2011 0443    K  4.5  07/20/2011 0443    CL  99  07/20/2011 0443    CO2  25  07/20/2011 0443    GLUCOSE  142*  07/20/2011 0443    BUN  19  07/20/2011 0443    CREATININE  1.67*  07/20/2011 0443    CALCIUM  8.3*  07/20/2011 0443    GFRNONAA  31*  07/20/2011 0443    GFRAA  36*  07/20/2011 0443    COAG  Lab Results   Component  Value  Date    INR  1.30  07/12/2011    INR  1.60*  07/11/2011    INR  1.68*  07/11/2011    No results found for this basename: PTT    Antibiotics  Anti-infectives     Start    Dose/Rate  Route  Frequency  Ordered  Stop     07/14/11 2215    cefUROXime (ZINACEF) 1.5 g in dextrose 5 % 50 mL IVPB  1.5 g  100 mL/hr over 30 Minutes  Intravenous  Every 12 hours  07/14/11 2213  07/15/11 0956                 07/11/11 2000    levofloxacin (LEVAQUIN) tablet 500 mg  500 mg  Oral  Every 24 hours  07/11/11 1721                      V. Annamarie Major IV, M.D.  [facility] [place]  Office: [phone]  Pager: [phone]

## 2011-07-20 NOTE — Clinical Documentation Improvement (Signed)
GENERIC DOCUMENTATION CLARIFICATION QUERY  THIS DOCUMENT IS NOT A PERMANENT PART OF THE MEDICAL RECORD   Please update your documentation within the medical record to reflect your response to this query.                                                                                         07/20/11   Ms. Roczniak,  In a better effort to capture the patient's severity of illness, reflect appropriate length of stay and utilization of resources, a review of the patient medical record has revealed the following indicators.    Based on your clinical judgment, please document in the progress notes and discharge summary if a condition below provides greater specificity regarding the patient's Hypertension Postoperatively:  - Accelerated Hypertension  - Other type of hypertension  - Other Condition (please specify)  - Unable to Clinically Determine   Clinical Information:  ,"Post-op Vital Signs: Reviewed and stable, on NTG drip for persistent hypertension" Anesthesia Post-op Evaluation signed by Midge Minium, MD at 07/13/11 1937  BP early postoperatively Systolic [id number] - [id number] Diastolic [id number] - [id number]    In responding to this query please exercise your independent judgment.  The fact that a query is asked, does not imply that any particular answer is desired or expected.   Reviewed: 07/24/11   "pt had htn postop.  Htn required short term Ntg. IV documented in addendum by Wray Kearns 07/14/11.   Vilinda Flake RN  Thank You,  Erling Conte  RN BSN Certified Clinical Documentation Specialist: Cell   [phone]   Health Information Management [place]   TO RESPOND TO THE THIS QUERY, FOLLOW THE INSTRUCTIONS BELOW:  1. If needed, update documentation for the patient's encounter via the notes activity.  2. Access this query again and click edit on the In [place] [place].  3. After updating, or not, click F2 to complete all highlighted (required) fields  concerning your review. Select "additional documentation in the medical record" OR "no additional documentation provided".  4. Click Sign note button.  5. The deficiency will fall out of your In Basket *Please let us know if you are not able to complete this workflow by phone or e-mail (listed below).

## 2011-07-20 NOTE — Progress Notes (Signed)
Pt. Transferred to rm. 2013, via wheelchair. Phone report called to [facility]. Pt. And family members aware of transfer.

## 2011-07-21 ENCOUNTER — Inpatient Hospital Stay (HOSPITAL_COMMUNITY): Payer: PRIVATE HEALTH INSURANCE

## 2011-07-21 DIAGNOSIS — R5381 Other malaise: Secondary | ICD-10-CM

## 2011-07-21 LAB — BASIC METABOLIC PANEL
BUN: 20 mg/dL (ref 6–23)
CO2: 23 mEq/L (ref 19–32)
Chloride: 100 mEq/L (ref 96–112)
Glucose, Bld: 181 mg/dL — ABNORMAL HIGH (ref 70–99)
Potassium: 3.3 mEq/L — ABNORMAL LOW (ref 3.5–5.1)

## 2011-07-21 NOTE — Progress Notes (Addendum)
[facility]  Post-op  Intra-abdominal Surgery note  Date of Surgery: 07/13/2011 Surgeon: Surgeon(s): Hanley Ben, MD Theotis Burrow, MD POD: 8 Days Post-Op Procedure(s): AORTA BIFEMORAL BYPASS GRAFT CYSTOSCOPY  History of Present Illness  Bobby Vega is a 77 y.o. male who is  up s/p Procedure(s): AORTA BIFEMORAL BYPASS GRAFT CYSTOSCOPY  Pt is doing well except C/O being "unable to use left hand". He has normal sensation and grip but cannot fully extend his fingers. He has normal motion of the left arm and leg. He had an a-line in the left hand.   I/O last 3 completed shifts: In: 960 [P.O.:960] Out: 985 [Urine:985]    Results for Bobby Vega, Bobby Vega (MRN [id number]) as of 07/21/2011 11:12  Ref. Range 07/14/2011 04:00 07/16/2011 04:12 07/18/2011 03:35 07/18/2011 15:48 07/21/2011 08:52  Sodium Latest Range: 135-145 mEq/L 133 (L) 134 (L) 135 135 136  Potassium Latest Range: 3.5-5.1 mEq/L 6.0 (H) 3.5 2.9 (L) 3.1 (L) 3.3 (L)  Chloride Latest Range: 96-112 mEq/L 102 102 100 99 100  CO2 Latest Range: 19-32 mEq/L 23 25 25 26 23   BUN Latest Range: 6-23 mg/dL 21 13 16 18 20   Creat Latest Range: 0.50-1.35 mg/dL 1.77 (H) 1.66 (H) 1.55 (H) 1.52 (H) 1.97 (H)  Calcium Latest Range: 8.4-10.5 mg/dL 8.0 (L) 8.2 (L) 8.8 9.1 9.2  GFR calc non Af Amer Latest Range: >90 mL/min 36 (L) 39 (L) 42 (L) 43 (L) 32 (L)  GFR calc Af Amer Latest Range: >90 mL/min 42 (L) 45 (L) 49 (L) 50 (L) 37 (L)  Glucose Latest Range: 70-99 mg/dL 142 (H) 141 (H) 125 (H) 161 (H) 181 (H)   Physical Examination BP Readings from Last 3 Encounters:  07/21/11 147/85  07/21/11 147/85  07/12/11 187/93   Temp Readings from Last 3 Encounters:  07/21/11 99.1 F (37.3 C) Oral  07/21/11 99.1 F (37.3 C) Oral  07/12/11 97.2 F (36.2 C)    SpO2 Readings from Last 3 Encounters:  07/21/11 97%  07/21/11 97%  07/12/11 96%   Pulse Readings from Last 3 Encounters:  07/21/11 115  07/13/11 74    07/21/11 115    General: A&O x 3, WDWN male in NAD Pulmonary: normal non-labored breathing , without Rales, rhonchi,  wheezing Cardiac: Heart rate : regular ,  Abdomen:abdomen soft and non-tender Abdominal wound:clean, dry, intact  Neurologic: A&O X 3; Appropriate Affect ; SENSATION: normal; MOTOR FUNCTION:  moving all extremities equally. Speech is fluent/normal  Vascular Exam:BLE warm and well perfused Extremities without ischemic changes, no Gangrene , no cellulitis; no open wounds;  LUE good ROM of the arm 5/5grip, pt unable to fully extend lefthand digits  Assessment: Bobby Vega is a 77 y.o. male who is 8 Days Post-Op Procedure(s): AORTA BIFEMORAL BYPASS GRAFT CYSTOSCOPY for difficult catheterization - urology consult  Plan: [facility] PT/OT rec. inpt rehab will consult OT to work with left hand Ambulate Care mgmt for Home needs if does not go to rehab Labs reviewed - renal function stable with slight increase in Cr - will follow  Bobby Vega [id number] 07/21/2011 7:53 AM

## 2011-07-21 NOTE — Consult Note (Addendum)
Reason for Consult:elevated creatinine, CKD Referring Physician: Dr. Baker Pierini Bobby Vega is an 77 y.o. male with history of PVD, HTN now POD#8 aortobifemoral bypass graft. Hospitalization/surgery only complicated by difficult foley cath passage, which was performed by Urology pre-operatively and catheter remains with good UOP, actually only 600 cc recorded last 24 hours but today already 550. Also having some isolated systolic hypertension up to 170-180, with cozaar being restarted on 1/23. Noted to have some component of CKD with admission cr 1.94 and has ranged between 1.52-1.97 since hospitalization (bumped with restarting of cozaar). Patient denies any history or knowledge of renal problems. Is unsure whether he has been taking daily naproxen vs daily avodart as he believes naproxen is for his BPH symptoms.     ROS: denies any dyspnea, edema, rash, diarrhea. Endorses soreness in stomach post-op, pain in urethra from catheter.  agree    PMH:   Past Medical History  Diagnosis Date  . Hyperlipidemia     takes Zocor nightly  . BPH (benign prostatic hyperplasia)     takes Avodart daily  . Constipation     Metamucil prn  . Borderline diabetes   . Pain in joint, hand   . ED (erectile dysfunction)     takes Cialis as needed as well as Viagra  . GERD (gastroesophageal reflux disease)     takes Nexium daily  . Hypertension     takes Losartan daily  . Peripheral vascular disease   . Embolism - blood clot     in stomach  . Bronchitis     yrs ago  . Arthritis     hip  . Lumbar spondylosis with myelopathy   . Cervical spondylosis without myelopathy   . Cervical spondylosis with myelopathy   . Lumbar spondylosis with myelopathy   . Chronic back pain   . Diverticulosis   . Urinary urgency   . Nocturia     PSH:   Past Surgical History  Procedure Date  . Skin graft 1983  . Aorta - bilateral femoral artery bypass graft 07/13/2011    Procedure: AORTA BIFEMORAL BYPASS GRAFT;   Surgeon: Theotis Burrow, MD;  Location: [facility];  Service: Vascular;  Laterality: Bilateral;  . Cystoscopy 07/13/2011    Procedure: CYSTOSCOPY;  Surgeon: Hanley Ben, MD;  Location: [facility];  Service: Urology;  Laterality: N/A;  cystoscopy,dilatation & insertion of councill foley catheter    Allergies: No Known Allergies  Medications:   Prior to Admission medications   Medication Sig Start Date End Date Taking? Authorizing Provider  dutasteride (AVODART) 0.5 MG capsule Take 0.5 mg by mouth daily.     Yes Historical Provider, MD  esomeprazole (NEXIUM) 40 MG capsule Take 40 mg by mouth daily before breakfast.     Yes Historical Provider, MD  loratadine (CLARITIN) 10 MG tablet Take 10 mg by mouth daily.     Yes Historical Provider, MD  losartan (COZAAR) 100 MG tablet Take 50 mg by mouth daily.     Yes Historical Provider, MD  naproxen (NAPROSYN) 500 MG tablet Take 500 mg by mouth 2 (two) times daily with a meal.     Yes Historical Provider, MD  pregabalin (LYRICA) 75 MG capsule Take 75 mg by mouth 3 (three) times daily.     Yes Historical Provider, MD  simvastatin (ZOCOR) 20 MG tablet Take 20 mg by mouth at bedtime.     Yes Historical Provider, MD  vitamin B-12 (CYANOCOBALAMIN) 250 MCG tablet Take 250 mcg  by mouth daily.     Yes Historical Provider, MD  albuterol (PROVENTIL HFA;VENTOLIN HFA) 108 (90 BASE) MCG/ACT inhaler Inhale 2 puffs into the lungs 4 (four) times daily as needed.      Historical Provider, MD  aspirin EC 81 MG tablet Take 81 mg by mouth daily.      Historical Provider, MD  sildenafil (VIAGRA) 100 MG tablet Take 100 mg by mouth daily as needed. For erectile dysfunction     Historical Provider, MD  tadalafil (CIALIS) 20 MG tablet Take 20 mg by mouth daily as needed. For erectile dysfuntion    Historical Provider, MD    Discontinued Meds:   Medications Discontinued During This Encounter  Medication Reason  . hemostatic agents Patient Discharge  . hemostatic agents Patient  Discharge  . sterile water for irrigation Patient Discharge  . lidocaine (XYLOCAINE) 2 % jelly Patient Discharge  . heparin 6,000 Units in sodium chloride irrigation 0.9 % 500 mL irrigation Patient Discharge  . 0.9 % irrigation (POUR BTL) Patient Discharge  . 0.9 %  sodium chloride infusion Patient Transfer  . HYDROmorphone (DILAUDID) injection 0.25-0.5 mg Patient Transfer  . ondansetron (ZOFRAN) injection 4 mg Patient Transfer  . dexmedetomidine (PRECEDEX) 400 mcg in sodium chloride 0.9 % 100 mL infusion Patient Transfer  . labetalol (NORMODYNE,TRANDATE) injection 5 mg Patient Transfer  . nitroGLYCERIN 0.2 mg/mL in dextrose 5 % infusion Patient Transfer  . alum & mag hydroxide-simeth (MAALOX PLUS) 400-400-40 MG/5ML suspension [id number] mL Duplicate  . dextrose 5 % and 0.45 % NaCl with KCl 20 mEq/L infusion   . dextrose 5 %-0.9 % sodium chloride infusion   . ondansetron (ZOFRAN) injection 4 mg Duplicate  . antiseptic oral rinse (BIOTENE) solution 15 mL   . antiseptic oral rinse (BIOTENE) solution 15 mL   . dextrose 5 %-0.45 % sodium chloride infusion   . famotidine (PEPCID) IVPB 20 mg   . potassium chloride 10 mEq in 50 mL *CENTRAL LINE* IVPB   . morphine 1 MG/ML PCA injection   . bisacodyl (DULCOLAX) suppository 10 mg   . naloxone (NARCAN) injection 0.4 mg   . sodium chloride 0.9 % injection 9 mL   . dextrose 5 %-0.45 % sodium chloride infusion       Family History:   Family History  Problem Relation Age of Onset  . Anesthesia problems Neg Hx   . Hypotension Neg Hx   . Malignant hyperthermia Neg Hx   . Pseudochol deficiency Neg Hx     Social History:  reports that he has been smoking Cigars.  He does not have any smokeless tobacco history on file. He reports that he drinks about one ounce of alcohol per week. He reports that he uses illicit drugs (Marijuana).   Blood pressure 124/75, pulse 108, temperature 98.9 F (37.2 C), temperature source Oral, resp. rate 20, height 5\' 6"   (1.676 m), weight 186 lb 15.2 oz (84.8 kg), SpO2 90.00%.  Physical Examination: General appearance - alert, well appearing, and in no distress, oriented to person, place, and time and acyanotic, in no respiratory distress Mental status - alert, oriented to person, place, and time, normal mood, behavior, speech, dress, motor activity, and thought processes Eyes - pupils equal and reactive, extraocular eye movements intact Mouth - mucous membranes moist, pharynx normal without lesions Chest - clear to auscultation, no wheezes, rales or rhonchi, symmetric air entry Heart - normal rate, regular rhythm, normal S1, S2, no murmurs, rubs, clicks or gallops Abdomen -  moderate distension, BS+. Foley catheter in place. Incision looks well healed Neurological - alert, oriented, normal speech, no focal findings or movement disorder noted, screening mental status exam normal, motor and sensory grossly normal bilaterally Extremities - peripheral pulses normal, no pedal edema, no clubbing or cyanosis   Intake/Output Summary (Last 24 hours) at 07/21/11 1448 Last data filed at 07/21/11 1300  Gross per 24 hour  Intake    240 ml  Output   1000 ml  Net   -760 ml    Creatinine, Ser  Date/Time Value Range Status  07/21/2011  8:52 AM 1.97* 0.50-1.35 (mg/dL) Final  07/18/2011  3:48 PM 1.52* 0.50-1.35 (mg/dL) Final  07/18/2011  3:35 AM 1.55* 0.50-1.35 (mg/dL) Final  07/16/2011  4:12 AM 1.66* 0.50-1.35 (mg/dL) Final  07/14/2011  4:00 AM 1.77* 0.50-1.35 (mg/dL) Final  07/13/2011  5:15 PM 1.70* 0.50-1.35 (mg/dL) Final  07/12/2011 12:03 PM 1.94* 0.50-1.35 (mg/dL) Final  07/04/2011  7:48 AM 1.80* 0.50-1.35 (mg/dL) Final    Results for orders placed during the hospital encounter of 07/13/11 (from the past 48 hour(s))  BASIC METABOLIC PANEL     Status: Abnormal   Collection Time   07/21/11  8:52 AM      Component Value Range Comment   Sodium 136  135 - 145 (mEq/L)    Potassium 3.3 (*) 3.5 - 5.1 (mEq/L)    Chloride 100   96 - 112 (mEq/L)    CO2 23  19 - 32 (mEq/L)    Glucose, Bld 181 (*) 70 - 99 (mg/dL)    BUN 20  6 - 23 (mg/dL)    Creatinine, Ser 1.97 (*) 0.50 - 1.35 (mg/dL)    Calcium 9.2  8.4 - 10.5 (mg/dL)    GFR calc non Af Amer 32 (*) >90 (mL/min)    GFR calc Af Amer 37 (*) >90 (mL/min)     No results found.   Assessment/Plan: 77 year old BM with probably some history of CKD but with bland UA ? Some RAS.  Some variation of creatinine this hospitalization.  Creatinine 1.5 a few days ago , now 1.9 after cozaar started and BP got relatively a little low.  77 yo male with PVD, HTN, ?CKD now POD#8 from aortobifemoral bypass graft with stable elevated creatinine.   1. CKD. Likely chronic in setting of HTN. UA neg except 100 protein. Cr has decreased to nadir of 1.5 range, and now returned to admission level since restarting home dose of ARB. No renal US in system, so this may help confirm diagnosis of longstanding renal disease. Some component of renal artery stenosis is possible given known PVD, so may consider renal artery duplex if HTN becomes uncontrolled, but currently seems improved with dual agents-cozaar and dutasteride so would hold off on doppler. Discussed at length with patient avoidance of NSAIDs and would avoid naproxen (home med) and any NSAIDs. Would keep the cozaar for now and follow renal function.  2. HTN. Will continue  ARB unless creatinine continues to rise, if it does would start alternative agent norvasc  3. BPH. Foley per urology. Avodart. Dont think is contributing to recent events as foley has been in whole time  Will also check on PTH to rule out secondary hyperparathyroidism.     [place] PGY-2 07/21/2011, 2:27 PM   Patient seen and examined, agree with above note with above modifications.  Corliss Parish, MD 07/21/2011  Agree with above Appreciate renal assistance OK to d/c home when cleared by renal and  pt feels comfortable going home  Thomas

## 2011-07-21 NOTE — Progress Notes (Signed)
Received OT consult for patient new onset inability to extend fingers of LUE. Examined patient with the following findings:  Pt reports onset within past 24hours  Pt reports "tingling" and numbness of LUE wrist and distally in all fingers and on dorsal and ventral surfaces of hand. This does not follow any dermatomal pattern.   Patient is able to extend all digits except for 3rd. 4th digit is slightly weaker than 2nd. Weakness present with wrist extension. This again is not consistent with any specific peripheral nerve injury/problem, to my knowledge.   No tone present in digits.   When performing PROM to digits, asked patient to attempt to hold digits in extension once placed there; pt able to maintain all digits in extension except 3rd which began drifting down.   Unable to perform extension against gravity.  Gross grasp weak on testing and WFL during functional task.   Gross motor coordination of LUE is WFL   Fine motor coordination is impaired (as demonstrated by slow, intentional movements).   Also, during bed mobility, patient reports "burning" sensation in RLE along lateral aspect of thigh. No redness or bumps noted.    Recommendation:  Per eval, patient symptoms not consistent with any nerve or muscular pathology, to my knowledge. Patient with no other symptoms, but question if this may be neurologically related? There is also the possibility of psychological involvement. Will continue to follow. Please order Neuro consult, if you agree.   Thank you. Darci Current, OTR/L Pager: [phone] 07/21/2011

## 2011-07-21 NOTE — Consult Note (Signed)
Physical Medicine and Rehabilitation Consult Reason for Consult: Deconditioning after aorta bifemoral bypass graft Referring Phsyician: Dr. Baker Pierini Bobby Vega is an 77 y.o. male.   HPI: 77 year old right-handed male with history of peripheral vascular disease and severe right lower extremity pain. He can only walk short distances before noted increased right lower tremor the pain. 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 Patient was admitted January 17 and underwent aortobifemoral bypass graft with bilateral profundoplasty on January 17 per Dr. Trula Slade. Postoperative ileus needing nasogastric tube for a short time diet slowly advanced. As of January 24 he was still using PCA for pain. Postoperatively he could not void needing assistance of urology Dr. Janice Norrie for placement of Foley catheter tube. Latest physical therapy notes January 23 patient ambulating 200 feet minimal assistance using a rolling walker and no significant limitations and postural control. Occupational therapy January 24 as patient was supervision to sit at the edge the bed as well as sit to stand. It was noted by both physical therapy and occupational therapy to request inpatient rehabilitation consult.  Review of Systems  Constitutional: Positive for malaise/fatigue.  HENT: Negative.   Eyes: Negative for double vision.  Respiratory: Negative for cough and shortness of breath.   Cardiovascular: Negative for chest pain.  Gastrointestinal: Positive for constipation. Negative for nausea.  Genitourinary: Negative for dysuria and urgency.  Musculoskeletal: Positive for myalgias.  Neurological: Negative for dizziness.  Psychiatric/Behavioral: Negative.    Past Medical History  Diagnosis Date  . Hyperlipidemia     takes Zocor nightly  . BPH (benign prostatic hyperplasia)     takes Avodart daily  . Constipation     Metamucil prn  . Borderline diabetes   . Pain in  joint, hand   . ED (erectile dysfunction)     takes Cialis as needed as well as Viagra  . GERD (gastroesophageal reflux disease)     takes Nexium daily  . Hypertension     takes Losartan daily  . Peripheral vascular disease   . Embolism - blood clot     in stomach  . Bronchitis     yrs ago  . Arthritis     hip  . Lumbar spondylosis with myelopathy   . Cervical spondylosis without myelopathy   . Cervical spondylosis with myelopathy   . Lumbar spondylosis with myelopathy   . Chronic back pain   . Diverticulosis   . Urinary urgency   . Nocturia    Past Surgical History  Procedure Date  . Skin graft 1983  . Aorta - bilateral femoral artery bypass graft 07/13/2011    Procedure: AORTA BIFEMORAL BYPASS GRAFT;  Surgeon: Theotis Burrow, MD;  Location: [facility];  Service: Vascular;  Laterality: Bilateral;  . Cystoscopy 07/13/2011    Procedure: CYSTOSCOPY;  Surgeon: Hanley Ben, MD;  Location: [facility];  Service: Urology;  Laterality: N/A;  cystoscopy,dilatation & insertion of councill foley catheter   Family History  Problem Relation Age of Onset  . Anesthesia problems Neg Hx   . Hypotension Neg Hx   . Malignant hyperthermia Neg Hx   . Pseudochol deficiency Neg Hx    Social History:  reports that he has been smoking Cigars.  He does not have any smokeless tobacco history on file. He reports that he drinks about one ounce of alcohol per week. He reports that he uses illicit drugs (Marijuana). Allergies: No Known Allergies Medications Prior to Admission  Medication Dose Route Frequency Provider Last  Rate Last Dose  . 0.9 %  sodium chloride infusion  500 mL Intravenous Once PRN Evorn Gong, PA      . acetaminophen (TYLENOL) tablet 325-650 mg  325-650 mg Oral Q4H PRN Evorn Gong, PA   650 mg at 07/19/11 2351   Or  . acetaminophen (TYLENOL) suppository 325-650 mg  325-650 mg Rectal Q4H PRN Evorn Gong, PA      . albuterol (PROVENTIL HFA;VENTOLIN HFA) 108 (90 BASE)  MCG/ACT inhaler 2 puff  2 puff Inhalation QID PRN Evorn Gong, PA      . alum & mag hydroxide-simeth (MAALOX PLUS) 400-400-40 MG/5ML suspension 15-30 mL  15-30 mL Oral Q2H PRN Kendra P Hiatt, PHARMD      . antiseptic oral rinse (BIOTENE) solution 15 mL  15 mL Mouth Rinse QID V Annamarie Major, MD   15 mL at 07/20/11 1600  . aspirin EC tablet 81 mg  81 mg Oral Daily Evorn Gong, PA   81 mg at 07/20/11 0941  . bisacodyl (DULCOLAX) suppository 10 mg  10 mg Rectal Daily PRN Mal Misty, MD   10 mg at 07/17/11 1030  . cefUROXime (ZINACEF) 1.5 g in dextrose 5 % 50 mL IVPB  1.5 g Intravenous On Call to [place], MD   1.5 g at 07/13/11 0940  . cefUROXime (ZINACEF) 1.5 g in dextrose 5 % 50 mL IVPB  1.5 g Intravenous Q12H Evorn Gong, PA   1.5 g at 07/14/11 1052  . chlorhexidine (PERIDEX) 0.12 % solution 15 mL  15 mL Mouth Rinse BID V Annamarie Major, MD   15 mL at 07/21/11 0754  . Chlorhexidine Gluconate Cloth 2 % PADS 6 each  6 each Topical Q0600 V Annamarie Major, MD   6 each at 07/19/11 1000  . dextrose 5 %-0.45 % sodium chloride infusion   Intravenous Continuous Nancy Nordmann Roczniak, PA      . diphenhydrAMINE (BENADRYL) injection 12.5 mg  12.5 mg Intravenous Q6H PRN V Annamarie Major, MD       Or  . diphenhydrAMINE (BENADRYL) 12.5 MG/5ML elixir 12.5 mg  12.5 mg Oral Q6H PRN V Annamarie Major, MD      . docusate sodium (COLACE) capsule 100 mg  100 mg Oral Daily Evorn Gong, PA   100 mg at 07/20/11 0942  . DOPamine (INTROPIN) 800 mg in dextrose 5 % 250 mL infusion  3-5 mcg/kg/min Intravenous Continuous Evorn Gong, PA      . dutasteride (AVODART) capsule 0.5 mg  0.5 mg Oral Daily Evorn Gong, PA   0.5 mg at 07/20/11 0941  . famotidine (PEPCID) tablet 20 mg  20 mg Oral BID V Annamarie Major, MD   20 mg at 07/20/11 2201  . furosemide (LASIX) injection 20 mg  20 mg Intravenous Once V Annamarie Major, MD   20 mg at 07/17/11 1031  . furosemide (LASIX) injection 20 mg  20  mg Intravenous Once V Annamarie Major, MD   20 mg at 07/18/11 1215  . guaiFENesin-dextromethorphan (ROBITUSSIN DM) 100-10 MG/5ML syrup 15 mL  15 mL Oral Q4H PRN Evorn Gong, PA      . hydrALAZINE (APRESOLINE) injection 10 mg  10 mg Intravenous Q2H PRN Evorn Gong, PA   10 mg at 07/16/11 1318  . hydrALAZINE (APRESOLINE) injection 20 mg  20 mg Intravenous Once Janeece Riggers, MD   20 mg at 07/13/11 1715  . HYDROmorphone (DILAUDID) 1 MG/ML injection           .  labetalol (NORMODYNE,TRANDATE) 5 MG/ML injection        20 mg at 07/13/11 1813  . labetalol (NORMODYNE,TRANDATE) injection 10 mg  10 mg Intravenous Q2H PRN Evorn Gong, PA   10 mg at 07/18/11 0104  . loratadine (CLARITIN) tablet 10 mg  10 mg Oral Daily Evorn Gong, [place]   10 mg at 07/20/11 0941  . losartan (COZAAR) tablet 50 mg  50 mg Oral Daily Evorn Gong, PA   50 mg at 07/20/11 0941  . magnesium sulfate IVPB 2 g 50 mL  2 g Intravenous Once PRN Evorn Gong, PA      . metoprolol (LOPRESSOR) injection 2-5 mg  2-5 mg Intravenous Q2H PRN Evorn Gong, PA      . morphine 2 MG/ML injection 2-5 mg  2-5 mg Intravenous Q1H PRN Evorn Gong, PA   4 mg at 07/14/11 1808  . morphine 2 MG/ML injection           . mupirocin ointment (BACTROBAN) 2 % 1 application  1 application Nasal BID V Annamarie Major, MD   1 application at [id number]  . mupirocin ointment (BACTROBAN) 2 %           . ondansetron (ZOFRAN) injection 4 mg  4 mg Intravenous Q6H PRN Evorn Gong, PA      . oxyCODONE (Oxy IR/ROXICODONE) immediate release tablet 5-10 mg  5-10 mg Oral Q4H PRN Evorn Gong, PA   10 mg at 07/21/11 0649  . pantoprazole (PROTONIX) EC tablet 40 mg  40 mg Oral Daily Evorn Gong, PA   40 mg at 07/20/11 0941  . phenol (CHLORASEPTIC) mouth spray 1 spray  1 spray Mouth/Throat PRN Evorn Gong, PA      . potassium chloride 10 mEq in 50 mL *CENTRAL LINE* IVPB  10 mEq Intravenous Q1H PRN V Annamarie Major, MD   10 mEq at 07/18/11 1852  . potassium chloride 10 mEq in 50 mL *CENTRAL LINE* IVPB  10 mEq Intravenous Once V Annamarie Major, MD   10 mEq at 07/18/11 1955  . potassium chloride 10 MEQ/50ML IVPB           . potassium chloride SA (K-DUR,KLOR-CON) CR tablet 20-40 mEq  20-40 mEq Oral Once PRN Evorn Gong, PA      . pregabalin (LYRICA) capsule 75 mg  75 mg Oral TID Evorn Gong, PA   75 mg at 07/20/11 2201  . simvastatin (ZOCOR) tablet 20 mg  20 mg Oral QHS Evorn Gong, PA   20 mg at 07/20/11 2201  . temazepam (RESTORIL) capsule 15 mg  15 mg Oral QHS PRN,MR X 1 Samantha Ellington, [place]      . vitamin B-12 (CYANOCOBALAMIN) tablet 250 mcg  250 mcg Oral Daily Evorn Gong, PA   250 mcg at 07/20/11 0942  . DISCONTD: 0.9 %  sodium chloride infusion   Intravenous Continuous V Annamarie Major, MD      . DISCONTD: 0.9 % irrigation (POUR BTL)    PRN V Annamarie Major, MD   1,000 mL at 07/13/11 1011  . DISCONTD: alum & mag hydroxide-simeth (MAALOX PLUS) 400-400-40 MG/5ML suspension 15-30 mL  15-30 mL Oral Q2H PRN Evorn Gong, PA      . DISCONTD: antiseptic oral rinse (BIOTENE) solution 15 mL  15 mL Mouth Rinse BID V Annamarie Major, MD   15 mL at 07/15/11 0830  . DISCONTD: antiseptic oral rinse (BIOTENE) solution 15 mL  15 mL Mouth Rinse BID V Annamarie Major, MD      .  DISCONTD: bisacodyl (DULCOLAX) suppository 10 mg  10 mg Rectal Daily PRN Mal Misty, MD      . DISCONTD: dexmedetomidine (PRECEDEX) 400 mcg in sodium chloride 0.9 % 100 mL infusion  0.4-1.2 mcg/kg/hr Intravenous To OR Janeece Riggers, MD      . DISCONTD: dextrose 5 % and 0.45 % NaCl with KCl 20 mEq/L infusion   Intravenous Continuous Evorn Gong, PA 100 mL/hr at 07/14/11 0700    . DISCONTD: dextrose 5 %-0.45 % sodium chloride infusion   Intravenous Continuous [name], PA 100 mL/hr at 07/17/11 0519    . DISCONTD: dextrose 5 %-0.45 % sodium chloride infusion   Intravenous Continuous V  Annamarie Major, MD 50 mL/hr at 07/20/11 0600    . DISCONTD: dextrose 5 %-0.9 % sodium chloride infusion   Intravenous Continuous [name], [place]      . DISCONTD: famotidine (PEPCID) IVPB 20 mg  20 mg Intravenous Q12H Evorn Gong, PA   20 mg at 07/17/11 1030  . DISCONTD: hemostatic agents    PRN V Annamarie Major, MD   1 application at 0000000 [phone]  . DISCONTD: hemostatic agents    PRN V Annamarie Major, MD   1 application at 0000000 [phone]  . DISCONTD: heparin 6,000 Units in sodium chloride irrigation 0.9 % 500 mL irrigation    PRN V Annamarie Major, MD      . DISCONTD: HYDROmorphone (DILAUDID) injection 0.25-0.5 mg  0.25-0.5 mg Intravenous Q5 min PRN Janeece Riggers, MD   0.5 mg at 07/13/11 1828  . DISCONTD: labetalol (NORMODYNE,TRANDATE) injection 5 mg  5 mg Intravenous Q10 min PRN E. Annye Asa, MD   5 mg at 07/13/11 1920  . DISCONTD: lidocaine (XYLOCAINE) 2 % jelly    PRN Hanley Ben, MD   1 application at 0000000 [phone]  . DISCONTD: morphine 1 MG/ML PCA injection   Intravenous Q4H V Annamarie Major, MD   11.85 mg at 07/20/11 0812  . DISCONTD: naloxone (NARCAN) injection 0.4 mg  0.4 mg Intravenous PRN V Annamarie Major, MD      . DISCONTD: nitroGLYCERIN 0.2 mg/mL in dextrose 5 % infusion  2-200 mcg/min Intravenous Titrated Hinda Lenis, MD 1.5 mL/hr at 07/13/11 1945 5 mcg/min at 07/13/11 1945  . DISCONTD: ondansetron (ZOFRAN) injection 4 mg  4 mg Intravenous Once PRN Janeece Riggers, MD      . DISCONTD: ondansetron [facility]) injection 4 mg  4 mg Intravenous Q6H PRN V Annamarie Major, MD      . DISCONTD: potassium chloride 10 mEq in 50 mL *CENTRAL LINE* IVPB  10 mEq Intravenous Q1H PRN V Annamarie Major, MD   50 mEq at 07/18/11 0440  . DISCONTD: sodium chloride 0.9 % injection 9 mL  9 mL Intravenous PRN V Annamarie Major, MD      . DISCONTD: sterile water for irrigation    PRN Hanley Ben, MD   2,000 mL at 07/13/11 0855   Medications Prior to Admission  Medication Sig  Dispense Refill  . dutasteride (AVODART) 0.5 MG capsule Take 0.5 mg by mouth daily.        Marland Kitchen esomeprazole (NEXIUM) 40 MG capsule Take 40 mg by mouth daily before breakfast.        . loratadine (CLARITIN) 10 MG tablet Take 10 mg by mouth daily.        Marland Kitchen losartan (COZAAR) 100 MG tablet Take 50 mg by mouth daily.        . naproxen (  NAPROSYN) 500 MG tablet Take 500 mg by mouth 2 (two) times daily with a meal.        . pregabalin (LYRICA) 75 MG capsule Take 75 mg by mouth 3 (three) times daily.        . simvastatin (ZOCOR) 20 MG tablet Take 20 mg by mouth at bedtime.        . vitamin B-12 (CYANOCOBALAMIN) 250 MCG tablet Take 250 mcg by mouth daily.        Marland Kitchen albuterol (PROVENTIL HFA;VENTOLIN HFA) 108 (90 BASE) MCG/ACT inhaler Inhale 2 puffs into the lungs 4 (four) times daily as needed.        Marland Kitchen aspirin EC 81 MG tablet Take 81 mg by mouth daily.        . sildenafil (VIAGRA) 100 MG tablet Take 100 mg by mouth daily as needed. For erectile dysfunction       . tadalafil (CIALIS) 20 MG tablet Take 20 mg by mouth daily as needed. For erectile dysfuntion        Home: Home Living Lives With: Alone Receives Help From: Family Type of Home: House Home Layout: One level Home Access: Stairs to enter Entrance Stairs-Rails: None Entrance Stairs-Number of Steps: 3 Bathroom Shower/Tub: Product/process development scientist: Standard Home Adaptive Equipment: Straight cane  Functional History: Prior Function Level of Independence: Independent with basic ADLs;Independent with homemaking with ambulation;Independent with gait;Independent with transfers;Requires assistive device for independence Able to Take Stairs?: Yes Driving: Yes Vocation: Retired Functional Status:  Mobility: Bed Mobility Bed Mobility: Yes Rolling Right: 4: Min assist Rolling Right Details (indicate cue type and reason): (A) to complete roll with cues for hand placement Rolling Left: 5: Supervision;With rail Rolling Left Details  (indicate cue type and reason): cues for safe technique and encouragement.   Right Sidelying to Sit: 4: Min assist;With rails Right Sidelying to Sit Details (indicate cue type and reason): (A) to elevate trunk OOB with  max cues for technique Left Sidelying to Sit: With rails;5: Supervision Left Sidelying to Sit Details (indicate cue type and reason): cues for use of UEs.  pt limited by pain in abdomen.   Sitting - Scoot to Edge of Bed: 5: Supervision Sit to Sidelying Left: 1: +2 Total assist;Patient percentage (comment) (pt 40%) Sit to Sidelying Left Details (indicate cue type and reason): A for Bil LEs and trunk  Transfers Transfers: Yes Sit to Stand: 5: Supervision;From bed Sit to Stand Details (indicate cue type and reason): verbal cues for hand placement Stand to Sit: 5: Supervision Stand to Sit Details: (A) to slowly descend to recliner with cues for hand placement Stand Pivot Transfers: 1: +2 Total assist;Patient percentage (comment) (pt 40%) Stand Pivot Transfer Details (indicate cue type and reason): cues for use of UEs and anterior wt shift with sit-stand.  step-by-step through pivot and use of armrests and control descent.   Ambulation/Gait Ambulation/Gait: Yes Ambulation/Gait Assistance: 4: Min assist Ambulation/Gait Assistance Details (indicate cue type and reason): Minguard for safety with cues for RW placement and body position within RW.   Ambulation Distance (Feet): 200 Feet Assistive device: Rolling walker Gait Pattern: Decreased stride length;Trunk flexed Stairs: No Wheelchair Mobility Wheelchair Mobility: No  ADL: ADL Eating/Feeding: Simulated;Set up Where Assessed - Eating/Feeding: Chair Grooming: Performed;Wash/dry hands;Wash/dry face;Supervision/safety Grooming Details (indicate cue type and reason): Min assist for sitting balance due to posterior lean to compensate for abdominal pain Where Assessed - Grooming: Standing at sink Upper Body Bathing:  Simulated;Chest;Right arm;Left arm;Abdomen;Moderate assistance Where Assessed -  Upper Body Bathing: Sitting, bed Lower Body Bathing: Simulated;+1 Total assistance Where Assessed - Lower Body Bathing: Sit to stand from bed Upper Body Dressing: Performed;Minimal assistance Upper Body Dressing Details (indicate cue type and reason): With donning gown and cues for reaching back to don sleeve Where Assessed - Upper Body Dressing: Sitting, bed Lower Body Dressing: Supervision/safety Lower Body Dressing Details (indicate cue type and reason): Instructed to cross opposite foot over knee to decrease pain. Where Assessed - Lower Body Dressing: Sitting, bed;Sit to stand from bed Toilet Transfer: Simulated;Supervision/safety Toilet Transfer Details (indicate cue type and reason): cues to avoid leaving walker Toilet Transfer Method: Ambulating Toilet Transfer Equipment: Other (comment) Building services engineer) Toileting - Clothing Manipulation: Simulated;Maximal assistance Where Assessed - Camera operator Manipulation: Standing Toileting - Hygiene: Simulated Where Assessed - Toileting Hygiene: Standing Tub/Shower Transfer: Not assessed Tub/Shower Transfer Method: Not assessed Equipment Used: Rolling walker Ambulation Related to ADLs: Min guard assist for ambulation with RW.   ADL Comments: Pt demonstrates decreased awareness of deficits and safety.  Cognition: Cognition Orientation Level: Oriented X4 Cognition Orientation Level: Oriented X4  Blood pressure 147/85, pulse 115, temperature 99.1 F (37.3 C), temperature source Oral, resp. rate 20, height 5\' 6"  (1.676 m), weight 84.8 kg (186 lb 15.2 oz), SpO2 97.00%. Physical Exam  Constitutional: He is oriented to person, place, and time. He appears well-developed.  HENT:  Head: Normocephalic.  Neck: Normal range of motion. Neck supple. No thyromegaly present.  Cardiovascular: Normal rate and regular rhythm.   Pulmonary/Chest: Effort normal. No respiratory  distress. He has no wheezes.  Abdominal: He exhibits no distension. There is no tenderness.  Musculoskeletal: He exhibits no edema.  Neurological: He is alert and oriented to person, place, and time.       Mild decreased fine motor skills left upper extremity.  Skin: Skin is warm.       Surgical incision site is clean dry  Psychiatric: He has a normal mood and affect.  4/5 strength bilateral lower extremities at the ankle dorsiflexor plantar flexors but 3 minus over 5 in the flexors knee extensors. Patient easily gets up from bed however supervision. Sensation is intact to light touch in bilateral upper and lower extremities Deep tendon reflexes are absent at the ankles intact at the knees bilaterally.  Assessment/Plan: Diagnosis: deconditioning after vascular surgery 1. Does the need for close, 24 hr/day medical supervision in concert with the patient's rehab needs make it unreasonable for this patient to be served in a less intensive setting? No 2. Co-Morbidities requiring supervision/potential complications: not applicable 3. Due to bladder management, skin/wound care and no other nursing issues, does the patient require 24 hr/day rehab nursing? No 4. Does the patient require coordinated care of a physician, rehab nurse, not applicable to address physical and functional deficits in the context of the above medical diagnosis(es)? No Addressing deficits in the following areas: not applicable 5. Can the patient actively participate in an intensive therapy program of at least 3 hrs of therapy per day at least 5 days per week? Yes 6. The potential for patient to make measurable gains while on inpatient rehab is not applicable 7. Anticipated functional outcomes upon discharge from inpatients are not applicable PT, not applicable OT, not applicableSLP 8. Estimated rehab length of stay to reach the above functional goals is: not applicable 9. Does the patient have adequate social supports to  accommodate these discharge functional goals? Yes 10. Anticipated D/C setting: Home 11. Anticipated post D/C treatments: home health and  family plans  additional supervisionOverall Rehab/Functional Prognosis: good  RECOMMENDATIONS: This patient's condition is appropriate for continued rehabilitative care in the following setting: [place] Patient has agreed to participate in recommended program. Yes Note that insurance prior authorization may be required for reimbursement for recommended care.  Comment:   ANGIULLI,DANIEL J. 07/21/2011

## 2011-07-21 NOTE — Progress Notes (Signed)
Physical Therapy Treatment Patient Details Name: Bobby Vega MRN: [id number] DOB: 06/10/1935 Today's Date: 07/21/2011  PT Assessment/Plan  PT - Assessment/Plan Comments on Treatment Session: pt doing much better today and seems more motivated for mobnility.  Feel at this time pt could D/C to home with family A instead of to CIR.   PT Plan: Discharge plan needs to be updated PT Frequency: Min 3X/week Follow Up Recommendations: Home health PT;Supervision - Intermittent Equipment Recommended: Rolling walker with 5" wheels PT Goals  Acute Rehab PT Goals PT Goal: Rolling Supine to Left Side - Progress: Progressing toward goal PT Goal: Supine/Side to Sit - Progress: Partly met PT Goal: Sit to Supine/Side - Progress: Not met PT Goal: Sit to Stand - Progress: Met PT Goal: Ambulate - Progress: Partly met  PT Treatment Precautions/Restrictions  Precautions Precautions: Fall Restrictions Weight Bearing Restrictions: No Mobility (including Balance) Bed Mobility Bed Mobility: Yes Supine to Sit: 6: Modified independent (Device/Increase time);With rails Sitting - Scoot to Edge of Bed: 7: Independent Transfers Transfers: Yes Sit to Stand: 6: Modified independent (Device/Increase time);With upper extremity assist;From bed Stand to Sit: 6: Modified independent (Device/Increase time);With upper extremity assist;To chair/3-in-1 Ambulation/Gait Ambulation/Gait: Yes Ambulation/Gait Assistance: 5: Supervision Ambulation/Gait Assistance Details (indicate cue type and reason): cues for safe use of RW.  pt could start ambulating without RW next session.   Ambulation Distance (Feet): 300 Feet Assistive device: Rolling walker Gait Pattern: Trunk flexed Stairs: No Wheelchair Mobility Wheelchair Mobility: No  Posture/Postural Control Posture/Postural Control: No significant limitations Balance Balance Assessed: No Exercise    End of Session PT - End of Session Equipment Utilized During  Treatment: Gait belt Activity Tolerance: Patient tolerated treatment well Patient left: in chair;with call bell in reach Nurse Communication: Mobility status for transfers;Mobility status for ambulation General Behavior During Session: [facility] for tasks performed Cognition: [facility] for tasks performed  Catarina Hartshorn, [place] 07/21/2011, 10:16 AM

## 2011-07-21 NOTE — Progress Notes (Signed)
Utilization review completed. Rozanna Boer, RN, BSN. 07/21/11

## 2011-07-21 NOTE — Clinical Documentation Improvement (Signed)
Abnormal Labs Clarification  THIS DOCUMENT IS NOT A PERMANENT PART OF THE MEDICAL RECORD   Please update your documentation within the medical record to reflect your response to this query.                                                                                    07/21/11  Bobby Vega,  In a better effort to capture your patient's severity of illness, reflect appropriate length of stay and utilization of resources, a review of the medical record has revealed the following indicators.    Based on your clinical judgment, please clarify and document in a progress note and/or discharge summary the clinical condition associated with the following supporting information:  In responding to this query please exercise your independent judgment.  The fact that a query is asked, does not imply that any particular answer is desired or expected.  Abnormal findings (laboratory, x-ray, pathologic, and other diagnostic results) are not coded and reported unless the physician indicates their clinical significance.   The medical record reflects the following clinical findings, please clarify the diagnostic and/or clinical significance:      If clinically appropriate, please document the clincial significance of the abnormal renal function labs in the progress notes and discharge summary:  - CKD Stage I - GFR =90  - CKD Stage II - GFR 60-80  - CKD Stage III - GFR 30-59  - CKD Stage IV - GFR 15-29  - CKD Stage V - GFR < 15  - Other Condition (please specify)  - Unable to Clinically Determine   Clinical Information:   BUN/CR/GFR this admission (bm) 22/1.94/32 22/1.70/38 21/1.77/36 13/1.66/39 16/1.55/42 18/1.55/42 18/1.52/43 20/1.97/32  Past Medical History  . Hypertension  . Hyperlipidemia  . BPH (benign prostatic hyperplasia)  . ED (erectile dysfunction)  . Constipation  . Arthritis hip  . Borderline diabetes  . Pain in joint, hand  . Lumbar spondylosis with  myelopathy  . Cervical spondylosis without myelopathy  V. Leia Alf, M.D. [facility] 07/10/11 1736  Reviewed:  07/24/11  "Labs reviewed - renal function stable. Slight increase in Cr. - will follow" documented by Wray Kearns documentation addendum 07/21/11.   Thank You,  Erling Conte  RN BSN Certified Clinical Documentation Specialist: Cell   [phone]  [facility] [employer] [place]   TO RESPOND TO THE THIS QUERY, FOLLOW THE INSTRUCTIONS BELOW:  1. If needed, update documentation for the patient's encounter via the notes activity.  2. Access this query again and click edit on the [employer].  3. After updating, or not, click F2 to complete all highlighted (required) fields concerning your review. Select "additional documentation in the medical record" OR "no additional documentation provided".  4. Click Sign note button.  5. The deficiency will fall out of your InBasket *Please let us know if you are not able to complete this workflow by phone or e-mail (listed below).

## 2011-07-22 ENCOUNTER — Inpatient Hospital Stay (HOSPITAL_COMMUNITY): Payer: PRIVATE HEALTH INSURANCE

## 2011-07-22 LAB — RENAL FUNCTION PANEL
CO2: 27 mEq/L (ref 19–32)
Calcium: 8.7 mg/dL (ref 8.4–10.5)
Creatinine, Ser: 1.93 mg/dL — ABNORMAL HIGH (ref 0.50–1.35)
GFR calc Af Amer: 38 mL/min — ABNORMAL LOW (ref >90)
GFR calc non Af Amer: 33 mL/min — ABNORMAL LOW (ref >90)
Phosphorus: 3 mg/dL (ref 2.3–4.6)
Sodium: 137 mEq/L (ref 135–145)

## 2011-07-22 LAB — CBC
MCHC: 32.7 g/dL (ref 30.0–36.0)
Platelets: 271 10*3/uL (ref 150–400)
RDW: 15.1 % (ref 11.5–15.5)
WBC: 13 10*3/uL — ABNORMAL HIGH (ref 4.0–10.5)

## 2011-07-22 NOTE — Progress Notes (Signed)
Subjective: Interval History: none.. + flatus,  Mod soreness  Objective: Vital signs in last 24 hours: Temp:  [98.9 F (37.2 C)-100.6 F (38.1 C)] 100.6 F (38.1 C) (01/26 0629) Pulse Rate:  [96-108] 96  (01/26 0629) Resp:  [18-20] 19  (01/26 0629) BP: (124-142)/(75-85) 142/85 mmHg (01/26 0629) SpO2:  [90 %-94 %] 94 % (01/26 0629)  Intake/Output from previous day: 01/25 0701 - 01/26 0700 In: 960 [P.O.:960] Out: 1600 [Urine:1600] Intake/Output this shift:    General appearance: alert and no distress GI: soft, non-tender; bowel sounds normal; no masses,  no organomegaly Extremities: groin wds healing. 2+ pulse  Lab Results:  Basename 07/22/11 0500  WBC 13.0*  HGB 10.0*  HCT 30.6*  PLT 271   BMET  Basename 07/22/11 0500 07/21/11 0852  NA 137 136  K 3.4* 3.3*  CL 100 100  CO2 27 23  GLUCOSE 114* 181*  BUN 21 20  CREATININE 1.93* 1.97*  CALCIUM 8.7 9.2    Studies/Results: Dg Chest 2 View  07/12/2011  *RADIOLOGY REPORT*  Clinical Data: Aorta bifemoral bypass graft surgery preop evaluation.  Hypertension and smoker  CHEST - 2 VIEW  Comparison: None.  Findings: Heart size and vascularity normal.  Lungs are clear without infiltrate or effusion.  No mass lesion is identified. Degenerative changes and spurring in the thoracic spine.  IMPRESSION: No active cardiopulmonary disease.  Original Report Authenticated By: Truett Perna, M.D.   Dg Chest Portable 1 View  07/14/2011  *RADIOLOGY REPORT*  Clinical Data: Postop aorto bifemoral bypass graft  PORTABLE CHEST - 1 VIEW  Comparison: Portable chest x-ray of 07/13/2011  Findings: The Swan-Ganz catheter and NG tube are unchanged.  Only mild basilar atelectasis is present.  Cardiomegaly is stable.  IMPRESSION: No change in mild basilar atelectasis.  Swan-Ganz catheter is unchanged in position.  Original Report Authenticated By: Joretta Bachelor, M.D.   Dg Chest Portable 1 View  07/13/2011  *RADIOLOGY REPORT*  Clinical Data:  Postoperative for aortobifemoral bypass.  PORTABLE CHEST - 1 VIEW  Comparison: 07/11/2001  Findings: The right internal jugular Swan-Ganz catheters present with tip projecting of the main pulmonary artery.  A nasogastric tube is present with tip in the stomach.  Airway thickening is present bilaterally.  Heart size is at the upper normal range.  IMPRESSION:  1. Airway thickening may reflect bronchitis or reactive airways disease.  No airspace opacity is identified to suggest bacterial pneumonia pattern. 2.  Swan-Ganz catheter tip:  Main pulmonary artery.  No pneumothorax or complicating feature.  Original Report Authenticated By: Carron Curie, M.D.   Dg Abd Portable 1v  07/14/2011  *RADIOLOGY REPORT*  Clinical Data: NG tube placement  PORTABLE ABDOMEN - 1 VIEW  Comparison: None.  Findings: A supine film of the abdomen shows the tip of the feeding tube to coil within the gastric fundus.  The bowel gas pattern is nonspecific.  There are degenerative changes in the lower lumbar spine.  IMPRESSION: NG tube coiled in gastric fundus.  Original Report Authenticated By: Joretta Bachelor, M.D.   Anti-infectives: Anti-infectives     Start     Dose/Rate Route Frequency Ordered Stop   07/13/11 2200   cefUROXime (ZINACEF) 1.5 g in dextrose 5 % 50 mL IVPB        1.5 g 100 mL/hr over 30 Minutes Intravenous Every 12 hours 07/13/11 2038 07/14/11 1122   07/13/11 0600   cefUROXime (ZINACEF) 1.5 g in dextrose 5 % 50 mL IVPB  1.5 g 100 mL/hr over 30 Minutes Intravenous On call to O.R. 07/12/11 1453 07/13/11 0940          Assessment/Plan: s/p Procedure(s): AORTA BIFEMORAL BYPASS GRAFT CYSTOSCOPY Creat stable  1.97 yest and 1.93 today.  Need s foley education prior to dc home with foley per GU.Marland Kitchen Home in AM if ok with Renal service   LOS: 9 days   Bobby Vega F 07/22/2011, 10:02 AM

## 2011-07-22 NOTE — Progress Notes (Signed)
Patient ambulated very steady fast pace using a rolling walker on room air with nursing assistant approximately 1000 feet. Patient tolerated ambulation well. Patient returned to bed resting. Will continue to monitor.

## 2011-07-23 LAB — BASIC METABOLIC PANEL
BUN: 18 mg/dL (ref 6–23)
CO2: 28 mEq/L (ref 19–32)
Calcium: 9 mg/dL (ref 8.4–10.5)
Chloride: 100 mEq/L (ref 96–112)
Creatinine, Ser: 1.9 mg/dL — ABNORMAL HIGH (ref 0.50–1.35)
GFR calc Af Amer: 38 mL/min — ABNORMAL LOW (ref >90)

## 2011-07-23 MED ORDER — BISACODYL 10 MG RE SUPP
10.0000 mg | Freq: Once | RECTAL | Status: AC
  Administered 2011-07-23: 10 mg via RECTAL

## 2011-07-23 NOTE — Progress Notes (Addendum)
[facility] Progress Note  07/23/2011 9:44 AM POD 10  No complaints this am with exception of no BM   Filed Vitals:   07/23/11 0632  BP: 120/70  Pulse: 91  Temp: 99.1 F (37.3 C)  Resp: 18    Incisions:  C/d/i without drainage. Extremities:  Warm and well perfused. Cardiac:  RRR Lungs:  CTAB Abdomen:  Slight distention.  No pain with palpation; -BM + flatus  CBC    Component Value Date/Time   WBC 13.0* 07/22/2011 0500   RBC 3.27* 07/22/2011 0500   HGB 10.0* 07/22/2011 0500   HCT 30.6* 07/22/2011 0500   PLT 271 07/22/2011 0500   MCV 93.6 07/22/2011 0500   MCH 30.6 07/22/2011 0500   MCHC 32.7 07/22/2011 0500   RDW 15.1 07/22/2011 0500   LYMPHSABS 3.0 07/12/2011 1203   MONOABS 0.5 07/12/2011 1203   EOSABS 0.3 07/12/2011 1203   BASOSABS 0.1 07/12/2011 1203    BMET    Component Value Date/Time   NA 137 07/22/2011 0500   K 3.4* 07/22/2011 0500   CL 100 07/22/2011 0500   CO2 27 07/22/2011 0500   GLUCOSE 114* 07/22/2011 0500   BUN 21 07/22/2011 0500   CREATININE 1.93* 07/22/2011 0500   CALCIUM 8.7 07/22/2011 0500   GFRNONAA 33* 07/22/2011 0500   GFRAA 38* 07/22/2011 0500    INR    Component Value Date/Time   INR 1.25 07/13/2011 1715     Intake/Output Summary (Last 24 hours) at 07/23/11 0944 Last data filed at 07/23/11 0814  Gross per 24 hour  Intake    240 ml  Output    800 ml  Net   -560 ml     Assessment/Plan:  77 y.o. male is s/p AORTA BIFEMORAL BYPASS GRAFT  CYSTOSCOPY POD 10 -dulcolax supp this am since no BM -will check labs today -will have renal see pt today -if pt has BM and of with renal, will d/c in am. -needs teaching about emptying foley cath. -f/u with urology this week after d/c   Evorn Gong, PA-C [facility] [phone] 07/23/2011 9:44 AM           I have examined the patient, reviewed and agree with above.  Rosetta Posner, MD 07/23/2011 10:19 AM

## 2011-07-23 NOTE — Progress Notes (Signed)
Subjective: Interval History: none.  Objective: Vital signs in last 24 hours:  Temp:  [99.1 F (37.3 C)-99.3 F (37.4 C)] 99.1 F (37.3 C) (01/27 [id number]) Pulse Rate:  [91-105] 91  (01/27 0632) Resp:  [18-19] 18  (01/27 0632) BP: (120-136)/(70-82) 120/70 mmHg (01/27 0632) SpO2:  [93 %-94 %] 94 % (01/27 [id number])  Weight change:   Intake/Output: I/O last 3 completed shifts: In: 720 [P.O.:720] Out: 1850 [Urine:1850]   Intake/Output this shift:  Total I/O In: 120 [P.O.:120] Out: -   Physical Examination:  Mental status - alert, oriented to person, place, and time,  Eyes - pupils equal and reactive Mouth - mucous membranes moist, Chest - clear to auscultation, no wheezes, rales or rhonchi, symmetric air entry  Heart - normal rate, regular rhythm, normal S1, S2, no murmurs, rubs, clicks or gallops  Abdomen - moderate distension, BS+. Foley catheter in place. Incision looks well healed  Neurological - alert, oriented, normal speech, no focal findings or movement disorder noted, screening mental status exam normal, motor and sensory grossly normal bilaterally  Extremities - peripheral pulses normal, no pedal edema, no clubbing or cyanosis   Lab Results:  Basename 07/22/11 0500  WBC 13.0*  HGB 10.0*  HCT 30.6*  PLT 271   BMET  Basename 07/22/11 0500 07/21/11 0852  NA 137 136  K 3.4* 3.3*  CL 100 100  CO2 27 23  GLUCOSE 114* 181*  BUN 21 20  CREATININE 1.93* 1.97*  CALCIUM 8.7 9.2  PHOS 3.0 --   LFT  Basename 07/22/11 0500  PROT --  ALBUMIN 2.6*  AST --  ALT --  ALKPHOS --  BILITOT --  BILIDIR --  IBILI --   PT/INR No results found for this basename: LABPROT:2,INR:2 in the last 72 hours Hepatitis Panel No results found for this basename: HEPBSAG,HCVAB,HEPAIGM,HEPBIGM in the last 72 hours  Studies/Results: US Renal  07/22/2011  *RADIOLOGY REPORT*  Clinical Data: Elevated creatinine  RENAL/URINARY TRACT ULTRASOUND COMPLETE  Comparison: None  Findings:  Right  Kidney = 10.6 cm.  There are two hypoechoic lesions in the lower pole of the right kidney measuring 11 mm and 5 mm which likely represents simple cysts.  No evidence of hydronephrosis.  No significant cortical thinning.  Left kidney = 11.3 cm.  Hyperechoic lesion in the lower pole measures 12 mm.  These also likely represents a cyst.  No evidence of hydronephrosis normal parenchymal echogenicity without significant thinning.  Bladder:  Bladder collapsed around a Foley catheter  IMPRESSION:  1. No evidence of cortical thinning or increased cortical echogenicity to suggest chronic renal disease. 2. Bilateral renal cysts appear simple. 3.  No hydronephrosis  Original Report Authenticated By: Suzy Bouchard, M.D.    I have reviewed the patient's current medications.  Assessment/Plan: 1. CKD. Likely chronic in setting of HTN. UA neg except 100 protein. Cr has decreased to nadir of 1.5 range, and now returned to admission level since restarting home dose of ARB. No renal US in system, so this may help confirm diagnosis of longstanding renal disease. Some component of renal artery stenosis is possible given known PVD, so may consider renal artery duplex if HTN becomes uncontrolled, but currently seems improved with dual agents-cozaar and dutasteride so would hold off on doppler. Discussed at length with patient avoidance of NSAIDs and would avoid naproxen (home med) and any NSAIDs. Would keep the cozaar for now and follow renal function..  Patient can follow up with [facility] as outpatient. Avoid hypotension  or volume depletion on Losartan.      LOS: 10 Ladamien Rammel W @TODAY @10 :48 AM

## 2011-07-24 ENCOUNTER — Encounter (HOSPITAL_COMMUNITY): Payer: Self-pay | Admitting: Gastroenterology

## 2011-07-24 ENCOUNTER — Other Ambulatory Visit: Payer: Self-pay | Admitting: *Deleted

## 2011-07-24 DIAGNOSIS — R3915 Urgency of urination: Secondary | ICD-10-CM

## 2011-07-24 DIAGNOSIS — R351 Nocturia: Secondary | ICD-10-CM

## 2011-07-24 LAB — URINALYSIS, ROUTINE W REFLEX MICROSCOPIC
Bilirubin Urine: NEGATIVE
Hgb urine dipstick: NEGATIVE
Ketones, ur: NEGATIVE mg/dL
Nitrite: NEGATIVE
Protein, ur: 30 mg/dL — AB
Urobilinogen, UA: 1 mg/dL (ref 0.0–1.0)

## 2011-07-24 LAB — CBC
HCT: 30.4 % — ABNORMAL LOW (ref 39.0–52.0)
MCHC: 32.2 g/dL (ref 30.0–36.0)
MCV: 95.3 fL (ref 78.0–100.0)
Platelets: 330 10*3/uL (ref 150–400)
RDW: 15.1 % (ref 11.5–15.5)
WBC: 20.6 10*3/uL — ABNORMAL HIGH (ref 4.0–10.5)

## 2011-07-24 LAB — VITAMIN D 25 HYDROXY (VIT D DEFICIENCY, FRACTURES): Vit D, 25-Hydroxy: 19 ng/mL — ABNORMAL LOW (ref 30–89)

## 2011-07-24 LAB — URINE MICROSCOPIC-ADD ON

## 2011-07-24 MED ORDER — POLYETHYLENE GLYCOL 3350 17 G PO PACK
17.0000 g | PACK | Freq: Every day | ORAL | Status: DC
  Administered 2011-07-24: 17 g via ORAL
  Filled 2011-07-24 (×2): qty 1

## 2011-07-24 MED ORDER — OXYCODONE HCL 5 MG PO TABS: 5.0000 mg | ORAL_TABLET | ORAL | Status: DC | PRN

## 2011-07-24 MED ORDER — BISACODYL 10 MG RE SUPP: 10.0000 mg | RECTAL | Status: AC | PRN

## 2011-07-24 NOTE — Progress Notes (Signed)
Home health is recommended. Patient not in need of intense inpatient rehabilitation at this time. Please clal me with any questions. Pager [phone].

## 2011-07-24 NOTE — Progress Notes (Signed)
Patient had a small bowel movement with bright red blood present. Patient was straining to have the bowel movement and I encouraged him to not do that. Patient also passed gas. Will continue to monitor.

## 2011-07-24 NOTE — Progress Notes (Signed)
Utilization review completed. Rozanna Boer, RN, BSN. 07/24/11

## 2011-07-24 NOTE — Progress Notes (Signed)
Occupational Therapy Treatment Patient Details Name: Bobby Vega MRN: [id number] DOB: Nov 04, 1934 Today's Date: 07/24/2011  OT Assessment/Plan OT Assessment/Plan Pt doing well. Continues with difficulty extending 3rd digit. Upon further examination, question brachial plexus injury perhaps during surgery? Provided patient with exercises and encouraged continued use of hand functionally.  OT Plan: Discharge plan needs to be updated Follow Up Recommendations: Home health OT Equipment Recommended:  (TBD) OT Goals ADL Goals ADL Goal: Grooming - Progress: Progressing toward goals ADL Goal: Lower Body Bathing - Progress: Progressing toward goals ADL Goal: Lower Body Dressing - Progress: Progressing toward goals ADL Goal: Toilet Transfer - Progress: Progressing toward goals  OT Treatment Precautions/Restrictions  Precautions Precautions: Fall   ADL ADL Grooming: Performed;Wash/dry hands;Wash/dry face;Supervision/safety Where Assessed - Grooming: Standing at sink Toilet Transfer: Chartered loss adjuster Method: Counselling psychologist: Regular height toilet;Grab bars Toileting - Hygiene: Performed;Minimal assistance Where Assessed - Toileting Hygiene: Standing Tub/Shower Transfer: Secretary/administrator Details (indicate cue type and reason): simulated in room with trash can Tub/Shower Transfer Method: Ambulating Equipment Used: Rolling walker Ambulation Related to ADLs: Supervision with RW ambulation ADL Comments: Pt doing well. Continues with difficulty extending 3rd digit. Upon further examination, question brachial plexus injury perhaps during surgery? Provided patient with exercises and encouraged continued use of hand functionally.  Mobility  Bed Mobility Rolling Right: 5: Supervision;With rail Rolling Right Details (indicate cue type and reason): VC to remember to roll in order to decrease pain Right Sidelying to  Sit: 5: Supervision;With rails;HOB flat End of Session OT - End of Session Equipment Utilized During Treatment: Gait belt Activity Tolerance: Patient tolerated treatment well Patient left: in chair;with call bell in reach General Behavior During Session: [facility] for tasks performed Cognition: [facility] for tasks performed  Jessamy Torosyan  07/24/2011, 3:20 PM

## 2011-07-24 NOTE — Progress Notes (Addendum)
[facility] [place]  Post-op  Intra-abdominal Surgery note  Date of Surgery: 07/13/2011 Surgeon: Surgeon(s): Hanley Ben, MD Theotis Burrow, MD POD: 11 Days Post-Op Procedure(s): AORTA BIFEMORAL BYPASS GRAFT CYSTOSCOPY  History of Present Illness  Bobby Vega is a 77 y.o. male who is  up s/p Procedure(s): AORTA BIFEMORAL BYPASS GRAFT CYSTOSCOPY Pt is doing well. complains of incisional, distal right inguinal incision superficial opening.  No active drainage. pain; complains of vomiting; denies diarrhea. has had flatus;has had BM with frank blood present in his stool.  IMAGING: US Renal  07/22/2011  *RADIOLOGY REPORT*  Clinical Data: Elevated creatinine  RENAL/URINARY TRACT ULTRASOUND COMPLETE  Comparison: None  Findings:  Right Kidney = 10.6 cm.  There are two hypoechoic lesions in the lower pole of the right kidney measuring 11 mm and 5 mm which likely represents simple cysts.  No evidence of hydronephrosis.  No significant cortical thinning.  Left kidney = 11.3 cm.  Hyperechoic lesion in the lower pole measures 12 mm.  These also likely represents a cyst.  No evidence of hydronephrosis normal parenchymal echogenicity without significant thinning.  Bladder:  Bladder collapsed around a Foley catheter  IMPRESSION:  1. No evidence of cortical thinning or increased cortical echogenicity to suggest chronic renal disease. 2. Bilateral renal cysts appear simple. 3.  No hydronephrosis  Original Report Authenticated By: Suzy Bouchard, M.D.    Significant Diagnostic Studies: CBC    Component Value Date/Time   WBC 13.0* 07/22/2011 0500   RBC 3.27* 07/22/2011 0500   HGB 10.0* 07/22/2011 0500   HCT 30.6* 07/22/2011 0500   PLT 271 07/22/2011 0500   MCV 93.6 07/22/2011 0500   MCH 30.6 07/22/2011 0500   MCHC 32.7 07/22/2011 0500   RDW 15.1 07/22/2011 0500   LYMPHSABS 3.0 07/12/2011 1203   MONOABS 0.5 07/12/2011 1203   EOSABS 0.3 07/12/2011 1203   BASOSABS 0.1 07/12/2011  1203    BMET    Component Value Date/Time   NA 140 07/23/2011 1100   K 3.7 07/23/2011 1100   CL 100 07/23/2011 1100   CO2 28 07/23/2011 1100   GLUCOSE 107* 07/23/2011 1100   BUN 18 07/23/2011 1100   CREATININE 1.90* 07/23/2011 1100   CALCIUM 9.0 07/23/2011 1100   GFRNONAA 33* 07/23/2011 1100   GFRAA 38* 07/23/2011 1100    COAG Lab Results  Component Value Date   INR 1.25 07/13/2011   INR 1.13 07/12/2011   No results found for this basename: PTT    I/O last 3 completed shifts: In: 360 [P.O.:360] Out: 1601 [Urine:1600; Stool:1]    Physical Examination BP Readings from Last 3 Encounters:  07/24/11 107/66  07/24/11 107/66  07/12/11 187/93   Temp Readings from Last 3 Encounters:  07/24/11 99.5 F (37.5 C) Oral  07/24/11 99.5 F (37.5 C) Oral  07/12/11 97.2 F (36.2 C)    SpO2 Readings from Last 3 Encounters:  07/24/11 93%  07/24/11 93%  07/12/11 96%   Pulse Readings from Last 3 Encounters:  07/24/11 95  07/13/11 74  07/24/11 95    General: A&O x 3, WDWN male in NAD Pulmonary: normal non-labored breathing , without Rales, rhonchi,  wheezing Abdomen:abdomen soft, non-tender and normal active bowel sounds Abdominal wound:clean, dry, intact  Incisional.  Distal right inguinal incision superficial opening.  Neurologic: A&O X 3; Appropriate Affect ; SENSATION: normal; MOTOR FUNCTION:  moving all extremities equally. Speech is fluent/normal Vascular Exam:BLE warm and well perfused Extremities without ischemic changes,  no Gangrene , no cellulitis; no open wounds;   LOWER EXTREMITY PULSES           RIGHT                                      LEFT      POSTERIOR TIBIAL present 1+ and palpable present 1+ and palpable       DORSALIS PEDIS      ANTERIOR TIBIAL present 1+ and palpable present 1+ and palpable       PERONEAL present 1+ and palpable present 1+ and palpable       Assessment: Bobby Vega is a 77 y.o. male who is 11 Days Post-Op Procedure(s): AORTA BIFEMORAL  BYPASS GRAFT CYSTOSCOPY  Plan: CBC, stool hemacult, UA.  Wet to dry right ingual incision.  PT/OT for weakness post-op eval and treat. Called GI consult. He will f/u with urology 1 week  Lanier Millon [facility] [phone] 07/24/2011 8:03 AM

## 2011-07-24 NOTE — Progress Notes (Signed)
Patient had another episode of bloody stool this morning. PA notified. Patient insisted that blood was coming from his catheter. How ever  Neither the foley nor the urine collected in the bag showed any signs of bleeding. Daughter of the patient called later and i explain to her the circumstance. i will continue to monitor. At this time both MD and PA are aware and have examined the patient.

## 2011-07-24 NOTE — Consult Note (Signed)
Reason for Consult bright red blood per rectum: Episodic Referring Physician: Vascular team  Bobby Vega is an 77 y.o. male.  HPI: The patient is seen at the request of the vascular team and I have seen him 10 or so years ago and he tells me I did an endoscopy although I cannot find that report but he is not aware he's never had a colonoscopy although there was a question of whether Dr. Benson Norway had done one in 2005. He's had recent vascular surgery and has had periodic bright red blood when he strains for years and does have chronic constipation where he takes MiraLAX and occasional magnesium citrate at home. He is eating fine and his abdomen is sore from the surgery but not too tender and he has no other complaints and wants to go home. His family history is negative for any colon cancer or colon polyps and his mother was a patient of mine with ventral hernia and hiatal hernia  Past Medical History  Diagnosis Date  . Hyperlipidemia     takes Zocor nightly  . BPH (benign prostatic hyperplasia)     takes Avodart daily  . Constipation     Metamucil prn  . Borderline diabetes   . Pain in joint, hand   . ED (erectile dysfunction)     takes Cialis as needed as well as Viagra  . GERD (gastroesophageal reflux disease)     takes Nexium daily  . Hypertension     takes Losartan daily  . Peripheral vascular disease   . Embolism - blood clot     in stomach  . Bronchitis     yrs ago  . Arthritis     hip  . Lumbar spondylosis with myelopathy   . Cervical spondylosis without myelopathy   . Cervical spondylosis with myelopathy   . Lumbar spondylosis with myelopathy   . Chronic back pain   . Diverticulosis   . Urinary urgency   . Nocturia     Past Surgical History  Procedure Date  . Skin graft 1983  . Aorta - bilateral femoral artery bypass graft 07/13/2011    Procedure: AORTA BIFEMORAL BYPASS GRAFT;  Surgeon: Theotis Burrow, MD;  Location: [facility];  Service: Vascular;  Laterality: Bilateral;   . Cystoscopy 07/13/2011    Procedure: CYSTOSCOPY;  Surgeon: Hanley Ben, MD;  Location: [facility];  Service: Urology;  Laterality: N/A;  cystoscopy,dilatation & insertion of councill foley catheter    Family History  Problem Relation Age of Onset  . Anesthesia problems Neg Hx   . Hypotension Neg Hx   . Malignant hyperthermia Neg Hx   . Pseudochol deficiency Neg Hx     Social History:  reports that he has been smoking Cigars.  He does not have any smokeless tobacco history on file. He reports that he drinks about one ounce of alcohol per week. He reports that he uses illicit drugs (Marijuana).  Allergies: No Known Allergies  Medications: I have reviewed the patient's current medications.  Results for orders placed during the hospital encounter of 07/13/11 (from the past 48 hour(s))  BASIC METABOLIC PANEL     Status: Abnormal   Collection Time   07/23/11 11:00 AM      Component Value Range Comment   Sodium 140  135 - 145 (mEq/L)    Potassium 3.7  3.5 - 5.1 (mEq/L)    Chloride 100  96 - 112 (mEq/L)    CO2 28  19 - 32 (mEq/L)  Glucose, Bld 107 (*) 70 - 99 (mg/dL)    BUN 18  6 - 23 (mg/dL)    Creatinine, Ser 1.90 (*) 0.50 - 1.35 (mg/dL)    Calcium 9.0  8.4 - 10.5 (mg/dL)    GFR calc non Af Amer 33 (*) >90 (mL/min)    GFR calc Af Amer 38 (*) >90 (mL/min)   CBC     Status: Abnormal   Collection Time   07/24/11  8:23 AM      Component Value Range Comment   WBC 20.6 (*) 4.0 - 10.5 (K/uL)    RBC 3.19 (*) 4.22 - 5.81 (MIL/uL)    Hemoglobin 9.8 (*) 13.0 - 17.0 (g/dL)    HCT 30.4 (*) 39.0 - 52.0 (%)    MCV 95.3  78.0 - 100.0 (fL)    MCH 30.7  26.0 - 34.0 (pg)    MCHC 32.2  30.0 - 36.0 (g/dL)    RDW 15.1  11.5 - 15.5 (%)    Platelets 330  150 - 400 (K/uL)   URINALYSIS, ROUTINE W REFLEX MICROSCOPIC     Status: Abnormal   Collection Time   07/24/11  4:23 PM      Component Value Range Comment   Color, Urine YELLOW  YELLOW     APPearance CLOUDY (*) CLEAR     Specific Gravity, Urine  1.018  1.005 - 1.030     pH 5.0  5.0 - 8.0     Glucose, UA NEGATIVE  NEGATIVE (mg/dL)    Hgb urine dipstick NEGATIVE  NEGATIVE     Bilirubin Urine NEGATIVE  NEGATIVE     Ketones, ur NEGATIVE  NEGATIVE (mg/dL)    Protein, ur 30 (*) NEGATIVE (mg/dL)    Urobilinogen, UA 1.0  0.0 - 1.0 (mg/dL)    Nitrite NEGATIVE  NEGATIVE     Leukocytes, UA SMALL (*) NEGATIVE    URINE MICROSCOPIC-ADD ON     Status: Abnormal   Collection Time   07/24/11  4:23 PM      Component Value Range Comment   Squamous Epithelial / LPF RARE  RARE     WBC, UA 3-6  <3 (WBC/hpf)    Casts GRANULAR CAST (*) NEGATIVE  HYALINE CASTS   Crystals URIC ACID CRYSTALS (*) NEGATIVE     Urine-Other AMORPHOUS URATES/PHOSPHATES       No results found.  ROS negative except above Blood pressure 122/65, pulse 92, temperature 98.8 F (37.1 C), temperature source Oral, resp. rate 18, height 5\' 6"  (1.676 m), weight 84.8 kg (186 lb 15.2 oz), SpO2 95.00%. Physical Exam vital signs stable abdomen he says is unchanged in size minimally sore  nontender positive bowel sounds no guarding or rebound hemoglobin stable white count slightly increased  Assessment/Plan: Chronic episodic bright red blood in a patient with chronic constipation Plan: Will follow with you and increase MiraLAX and happy to proceed with an outpatient colonoscopy in a few months when okay from a surgical standpoint. I doubt the patient has intestinal ischemia but if flexible sigmoidoscopy needed per vascular team please let me know  Clotilde Loth E 07/24/2011, 5:18 PM

## 2011-07-24 NOTE — Progress Notes (Signed)
Patient had a bowel movement, significant amount of bright red blood was noted in the bowels, MD notified, no new order. Continue to monitor

## 2011-07-24 NOTE — Progress Notes (Signed)
Physical Therapy Treatment Patient Details Name: Sandip Gottschall MRN: [id number] DOB: 1935/03/16 Today's Date: 07/24/2011  PT Assessment/Plan  PT - Assessment/Plan Comments on Treatment Session: Excellent progress overall; agree with plan to dc home as opposed to CIR; pt reports he has intermittent family assist available and that help is a phone call away PT Plan: Discharge plan remains appropriate PT Frequency: Min 3X/week Follow Up Recommendations: Home health PT;Supervision - Intermittent Equipment Recommended: Rolling walker with 5" wheels PT Goals  Acute Rehab PT Goals Time For Goal Achievement: 2 weeks Pt will Roll Supine to Left Side: Independently PT Goal: Rolling Supine to Left Side - Progress: Other (comment) Pt will go Supine/Side to Sit: Independently PT Goal: Supine/Side to Sit - Progress: Other (comment) Pt will go Sit to Supine/Side: Independently PT Goal: Sit to Supine/Side - Progress: Other (comment) Pt will go Sit to Stand: with modified independence;with upper extremity assist PT Goal: Sit to Stand - Progress: Progressing toward goal Pt will Ambulate: >150 feet;with modified independence;with rolling walker PT Goal: Ambulate - Progress: Progressing toward goal Pt will Go Up / Down Stairs: 3-5 stairs;with modified independence;with least restrictive assistive device PT Goal: Up/Down Stairs - Progress: Progressing toward goal  PT Treatment Precautions/Restrictions  Precautions Precautions: Fall Restrictions Weight Bearing Restrictions: No Mobility (including Balance) Bed Mobility Bed Mobility: No Rolling Right: 5: Supervision;With rail Rolling Right Details (indicate cue type and reason): VC to remember to roll in order to decrease pain Right Sidelying to Sit: 5: Supervision;With rails;HOB flat Transfers Sit to Stand: 6: Modified independent (Device/Increase time);With upper extremity assist;From bed Sit to Stand Details (indicate cue type and reason): smooth  transition, good control Stand to Sit: 6: Modified independent (Device/Increase time);With upper extremity assist;To chair/3-in-1 Stand to Sit Details: smooth transition, good control and correct hand palcement without cues Ambulation/Gait Ambulation/Gait Assistance: 5: Supervision Ambulation/Gait Assistance Details (indicate cue type and reason): encouragement and upright posture Ambulation Distance (Feet): 200 Feet Assistive device: Rolling walker Gait Pattern:  (some trunk flexion, though minimal) Stairs: Yes Stairs Assistance: 4: Min assist Stairs Assistance Details (indicate cue type and reason): close guard on steps; pt was able to ascend and descend 10 steps with two rails; he reports he can hold onto posts on each side for his 3 steps to enter his home Stair Management Technique: Two rails Number of Stairs: 10   Posture/Postural Control Posture/Postural Control: No significant limitations    End of Session PT - End of Session Equipment Utilized During Treatment: Gait belt Activity Tolerance: Patient tolerated treatment well Patient left: in chair;with call bell in reach Nurse Communication: Mobility status for transfers;Mobility status for ambulation General Behavior During Session: [facility] for tasks performed Cognition: [facility] for tasks performed  Roney Marion [facility] [place], [place]  07/24/2011, 4:56 PM

## 2011-07-25 ENCOUNTER — Other Ambulatory Visit: Payer: Self-pay

## 2011-07-25 ENCOUNTER — Other Ambulatory Visit: Payer: Self-pay | Admitting: Physician Assistant

## 2011-07-25 DIAGNOSIS — I739 Peripheral vascular disease, unspecified: Secondary | ICD-10-CM

## 2011-07-25 LAB — CBC
HCT: 26.2 % — ABNORMAL LOW (ref 39.0–52.0)
Hemoglobin: 8.6 g/dL — ABNORMAL LOW (ref 13.0–17.0)
MCV: 94.6 fL (ref 78.0–100.0)
RBC: 2.77 MIL/uL — ABNORMAL LOW (ref 4.22–5.81)
RDW: 15.1 % (ref 11.5–15.5)
WBC: 16.4 10*3/uL — ABNORMAL HIGH (ref 4.0–10.5)

## 2011-07-25 LAB — DIFFERENTIAL
Basophils Absolute: 0.1 10*3/uL (ref 0.0–0.1)
Lymphocytes Relative: 16 % (ref 12–46)
Lymphs Abs: 2.6 10*3/uL (ref 0.7–4.0)
Monocytes Absolute: 1.1 10*3/uL — ABNORMAL HIGH (ref 0.1–1.0)
Monocytes Relative: 7 % (ref 3–12)
Neutro Abs: 12.1 10*3/uL — ABNORMAL HIGH (ref 1.7–7.7)

## 2011-07-25 MED ORDER — OXYCODONE HCL 5 MG PO TABS: 5.0000 mg | ORAL_TABLET | ORAL | Status: AC | PRN

## 2011-07-25 NOTE — Progress Notes (Signed)
Bobby Vega 2:11 PM  Subjective: The patient has not seen any more blood in his bowels and is not having any abdominal pain and is eating well and wants to go home  Objective: Vital signs stable afebrile abdomen is soft nontender white count decreased a little hemoglobin decreased as well  Assessment: Subacute GI bleeding  Plan: I would like to see back as an outpatient to set up elective colonoscopy when okay from postop standpoint however happy to see back sooner when necessary and would recommend repeating a CBC at some point on followup [facility]

## 2011-07-25 NOTE — Discharge Summary (Signed)
Agree with above  Bobby Vega 

## 2011-07-25 NOTE — Discharge Summary (Signed)
[facility] Discharge Summary   Patient ID:  Bobby Vega MRN: [id number] DOB/AGE: 1934/11/07 77 y.o.  Admit date: 07/13/2011 Discharge date: 07/25/2011 Date of Surgery: 07/13/2011 AORTA BIFEMORAL BYPASS GRAFT  Surgeon: Surgeon(s): Hanley Ben, MD Theotis Burrow, MD  Admission Diagnosis: PVD  Discharge Diagnoses:  PVD  Secondary Diagnoses: Past Medical History  Diagnosis Date  . Hyperlipidemia     takes Zocor nightly  . BPH (benign prostatic hyperplasia)     takes Avodart daily  . Constipation     Metamucil prn  . Borderline diabetes   . Pain in joint, hand   . ED (erectile dysfunction)     takes Cialis as needed as well as Viagra  . GERD (gastroesophageal reflux disease)     takes Nexium daily  . Hypertension     takes Losartan daily  . Peripheral vascular disease   . Embolism - blood clot     in stomach  . Bronchitis     yrs ago  . Arthritis     hip  . Lumbar spondylosis with myelopathy   . Cervical spondylosis without myelopathy   . Cervical spondylosis with myelopathy   . Lumbar spondylosis with myelopathy   . Chronic back pain   . Diverticulosis   . Urinary urgency   . Nocturia     Procedure(s): AORTA BIFEMORAL BYPASS GRAFT CYSTOSCOPY  Discharged Condition: good  HPI: The patient is here today for further discussions regarding his vascular insufficiency. The patient has severe symptoms in his right leg. He can walk approximately 20 feet before he does dead. This is been going on for 2 years but has gotten significantly worse. He also wakes up approximately 5 times at night to hang his leg over the side of the bed. 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 He was not a candidate for endovascular repair he comes in to discuss the surgical options.      Hospital Course:  Bobby Vega is a 77 y.o. male is S/P BilateralAortoBifemoral Bypass  with Bilateral profundoplasty with 16x8 Dacron graft. Re-implant IMA   Procedure(s):    Post-op wounds healing well, right femoral incision with superficial wound dry dressing in place Pt. Ambulating, voiding and taking PO diet without difficulty. Pt pain controlled with PO pain meds. Labs as below Complications: Bloody stools-consulted Dr.Marc E Magod will f/u as out patient.    Reason for consultation: Foley insertion.  Patient is a 77 years old male who is scheduled for aortofemoral bypass. The nurses and the physicians could not get a Foley catheter in the bladder. I was then called and asked to insert Foley in the bladder. The patient was already under general anesthesia. On examination he had some urethral bleeding and there was blood at the meatus. I have prepped the penis with Betadine solution. I then passed the flexible cystoscope through the urethra. There is a false passage at the prostatic urethra and the cystoscope could not be passed in the bladder. The urethra is markedly distorted at this point and I was not able to identify normal urethra. I then passed a glide wire through the cystoscope. After multiple attempts I was able to pass the glide wire through a small opening in the urethra and in the guidewire was passed in the bladder. I was then able to advance the flexible cystoscope over the glidewire in the bladder. I then sequentially dilated the urethra with a Heymann's dilators  up to #24 [place]. I then passed a #16 [place] council catheter over the glide wire in the bladder. The balloon of the Foley catheter was then inflated with 10 cc of water. Clear urine drained out of the bladder. The catheter was then left to straight drainage. The patient was then left in the supine position for the surgery by Dr. Rock Nephew and Dr. Scot Dock.   Consults:  Treatment Team:  Louis Meckel, MD Jeryl Columbia, MD  Significant Diagnostic Studies: CBC    Component Value Date/Time   WBC  16.4* 07/25/2011 0535   RBC 2.77* 07/25/2011 0535   HGB 8.6* 07/25/2011 0535   HCT 26.2* 07/25/2011 0535   PLT 285 07/25/2011 0535   MCV 94.6 07/25/2011 0535   MCH 31.0 07/25/2011 0535   MCHC 32.8 07/25/2011 0535   RDW 15.1 07/25/2011 0535   LYMPHSABS 2.6 07/25/2011 0535   MONOABS 1.1* 07/25/2011 0535   EOSABS 0.5 07/25/2011 0535   BASOSABS 0.1 07/25/2011 0535    BMET    Component Value Date/Time   NA 140 07/23/2011 1100   K 3.7 07/23/2011 1100   CL 100 07/23/2011 1100   CO2 28 07/23/2011 1100   GLUCOSE 107* 07/23/2011 1100   BUN 18 07/23/2011 1100   CREATININE 1.90* 07/23/2011 1100   CALCIUM 9.0 07/23/2011 1100   GFRNONAA 33* 07/23/2011 1100   GFRAA 38* 07/23/2011 1100    COAG Lab Results  Component Value Date   INR 1.25 07/13/2011   INR 1.13 07/12/2011     Disposition:  Discharge to :Home Discharge Orders    Future Appointments: Provider: Department: Dept Phone: Center:   08/14/2011 1:00 PM V Annamarie Major, MD [facility] [phone] [facility]     Future Orders Please Complete By Expires   Resume previous diet      Driving Restrictions      Comments:   No driving for 4 weeks   Lifting restrictions      Comments:   No lifting for 6 weeks   Call MD for:  temperature >100.5      Call MD for:  redness, tenderness, or signs of infection (pain, swelling, bleeding, redness, odor or green/yellow discharge around incision site)      Call MD for:  severe or increased pain, loss or decreased feeling  in affected limb(s)      Increase activity slowly      Comments:   Walk with assistance use walker or cane as needed   ABDOMINAL PROCEDURE/ANEURYSM REPAIR/AORTO-BIFEMORAL BYPASS:  Call MD for increased abdominal pain; cramping diarrhea; nausea/vomiting         [name], [name]  Home Medication Instructions [id number]   Printed on:07/25/11 0728  Medication Information                    aspirin EC 81 MG tablet Take 81 mg by mouth daily.             dutasteride (AVODART) 0.5 MG  capsule Take 0.5 mg by mouth daily.             vitamin B-12 (CYANOCOBALAMIN) 250 MCG tablet Take 250 mcg by mouth daily.             losartan (COZAAR) 100 MG tablet Take 50 mg by mouth daily.             simvastatin (ZOCOR) 20 MG tablet Take 20 mg by mouth at bedtime.  esomeprazole (NEXIUM) 40 MG capsule Take 40 mg by mouth daily before breakfast.             albuterol (PROVENTIL HFA;VENTOLIN HFA) 108 (90 BASE) MCG/ACT inhaler Inhale 2 puffs into the lungs 4 (four) times daily as needed.             loratadine (CLARITIN) 10 MG tablet Take 10 mg by mouth daily.             pregabalin (LYRICA) 75 MG capsule Take 75 mg by mouth 3 (three) times daily.             tadalafil (CIALIS) 20 MG tablet Take 20 mg by mouth daily as needed. For erectile dysfuntion           sildenafil (VIAGRA) 100 MG tablet Take 100 mg by mouth daily as needed. For erectile dysfunction            bisacodyl (DULCOLAX) 10 MG suppository Place 1 suppository (10 mg total) rectally as needed for constipation.           oxyCODONE (OXY IR/ROXICODONE) 5 MG immediate release tablet Take 1 tablet (5 mg total) by mouth every 4 (four) hours as needed.            Verbal and written Discharge instructions given to the patient. Wound care per Discharge AVS Follow-up Information    Follow up with Trula Slade IV, [place], MD in 2 weeks. (Office will call you to arrange your appt)    Contact information:   [place] [phone]          Signed: [name] 07/25/2011, 7:28 AM

## 2011-07-25 NOTE — Progress Notes (Signed)
Discharge instructions and patient education complete. IV d/c. Site WNL. Pt discharged home with foley per MD order. Foley leg bag sent with patient and family. No s/s of distress. Discharged home via wheelchair. Marko Plume

## 2011-07-25 NOTE — Progress Notes (Signed)
CARE MANAGEMENT NOTE 07/25/2011   Discharge planning.Case manager  spoke with pt regarding need for [facility]. Choice offered.Entered in [facility] request for Home Health and rolling walker.

## 2011-07-26 ENCOUNTER — Other Ambulatory Visit: Payer: Self-pay | Admitting: *Deleted

## 2011-07-26 DIAGNOSIS — I7092 Chronic total occlusion of artery of the extremities: Secondary | ICD-10-CM

## 2011-07-26 DIAGNOSIS — I70269 Atherosclerosis of native arteries of extremities with gangrene, unspecified extremity: Secondary | ICD-10-CM

## 2011-08-01 ENCOUNTER — Encounter: Payer: Self-pay | Admitting: Physician Assistant

## 2011-08-01 ENCOUNTER — Telehealth: Payer: Self-pay

## 2011-08-01 NOTE — Telephone Encounter (Signed)
Home care nurse called to report right groin wound dehiscense and fever of 99.1, pulse 115. (s/p Aobifem Bypass w/ dacron of 07/13/11).  Requesting evaluation of wound, and updated wound care orders.  Spoke w/daughter.  Offered appt. 08/02/11 with PA.  Agrees w/ plan.  States pt's son will come to appt. w/ him.

## 2011-08-02 ENCOUNTER — Ambulatory Visit (INDEPENDENT_AMBULATORY_CARE_PROVIDER_SITE_OTHER): Payer: PRIVATE HEALTH INSURANCE | Admitting: Physician Assistant

## 2011-08-02 ENCOUNTER — Encounter: Payer: Self-pay | Admitting: Physician Assistant

## 2011-08-02 VITALS — BP 136/78 | HR 84 | Resp 20 | Ht 67.0 in | Wt 186.0 lb

## 2011-08-02 DIAGNOSIS — I739 Peripheral vascular disease, unspecified: Secondary | ICD-10-CM

## 2011-08-02 NOTE — Progress Notes (Signed)
[facility] POST OPERATIVE OFFICE NOTE  CC:  F/u for surgery  HPI:  This is a 77 y.o. male who is s/p Aortobifemoral bypass grafting.  He returns today for inspection of his groin wounds.  He states that he has not been running any fevers.  His foley has been removed.    No Known Allergies  Current Outpatient Prescriptions  Medication Sig Dispense Refill  . albuterol (PROVENTIL HFA;VENTOLIN HFA) 108 (90 BASE) MCG/ACT inhaler Inhale 2 puffs into the lungs 4 (four) times daily as needed.        Marland Kitchen aspirin EC 81 MG tablet Take 81 mg by mouth daily.        . bisacodyl (DULCOLAX) 10 MG suppository Place 1 suppository (10 mg total) rectally as needed for constipation.  12 suppository    . dutasteride (AVODART) 0.5 MG capsule Take 0.5 mg by mouth daily.        Marland Kitchen esomeprazole (NEXIUM) 40 MG capsule Take 40 mg by mouth daily before breakfast.        . loratadine (CLARITIN) 10 MG tablet Take 10 mg by mouth daily.        Marland Kitchen losartan (COZAAR) 100 MG tablet Take 50 mg by mouth daily.        Marland Kitchen oxyCODONE (OXY IR/ROXICODONE) 5 MG immediate release tablet Take 1 tablet (5 mg total) by mouth every 4 (four) hours as needed.  30 tablet  0  . pregabalin (LYRICA) 75 MG capsule Take 75 mg by mouth 3 (three) times daily.        . sildenafil (VIAGRA) 100 MG tablet Take 100 mg by mouth daily as needed. For erectile dysfunction       . simvastatin (ZOCOR) 20 MG tablet Take 20 mg by mouth at bedtime.        . tadalafil (CIALIS) 20 MG tablet Take 20 mg by mouth daily as needed. For erectile dysfuntion      . vitamin B-12 (CYANOCOBALAMIN) 250 MCG tablet Take 250 mcg by mouth daily.           ROS:  See HPI  Physical Exam:  Filed Vitals:   08/02/11 1614  BP: 136/78  Pulse: 84  Resp: 20    Incision: entire  Left groin open with yellow exudate.  No purulent drainage noted. Right groin with minimal yellow exudate. Extremities:  BLE warm. Neuro: no focal deficits Abdomen:  Soft, slightly tender;  +BS  A/P:  This is a 77 y.o. male here for wound check after aortobifemoral bypass grafting. -left groin exudate debrided.  Wound dressed with wet to dry dressing.  Instructed son on how to perform wet to dry dressings bid. -instructed pt and his son to keep a dry dressing to the left groin and change daily. -will have pt f/u with Dr. Trula Slade on Monday to re-evaluate wound. -Rx given for OxyIR 5 mg po q4-6hr prn pain # 30 (thirty) NR  Evorn Gong, PA-C [facility] [phone]  Clinic MD:  Scot Dock

## 2011-08-04 ENCOUNTER — Encounter: Payer: Self-pay | Admitting: Surgery

## 2011-08-07 ENCOUNTER — Encounter: Payer: Self-pay | Admitting: Surgery

## 2011-08-07 ENCOUNTER — Ambulatory Visit (INDEPENDENT_AMBULATORY_CARE_PROVIDER_SITE_OTHER): Payer: PRIVATE HEALTH INSURANCE | Admitting: Surgery

## 2011-08-07 VITALS — BP 153/74 | HR 90 | Resp 16 | Ht 66.0 in | Wt 173.0 lb

## 2011-08-07 DIAGNOSIS — S31109A Unspecified open wound of abdominal wall, unspecified quadrant without penetration into peritoneal cavity, initial encounter: Secondary | ICD-10-CM | POA: Insufficient documentation

## 2011-08-07 DIAGNOSIS — R29898 Other symptoms and signs involving the musculoskeletal system: Secondary | ICD-10-CM

## 2011-08-07 DIAGNOSIS — M6281 Muscle weakness (generalized): Secondary | ICD-10-CM

## 2011-08-07 DIAGNOSIS — I70219 Atherosclerosis of native arteries of extremities with intermittent claudication, unspecified extremity: Secondary | ICD-10-CM

## 2011-08-07 NOTE — Progress Notes (Signed)
The patient returns today for his postoperative followup. He is status post aortobifemoral bypass grafting for right leg rest pain. The patient had extreme difficulty with Foley catheter placement, requiring cystoscopy. He has since had his Foley taken out. He is at home with pain adequately controlled. He is having regular bowel movements. He is voiding without difficulty. His appetite is back to normal. His biggest complaint is that of left hand weakness which was present in the hospital. He is requiring packing of his groin incisions, bilaterally. He has a superficial opening in both incisions the left is greater than the right. I did remove a retained suture on the left groin today.  The patient associated pain this hand from an attempted IV. I told him that most likely this was either nerve injury from positioning or possibly a stroke. Regardless I do not think that the treatment would be any different. I stressed the importance of rehabilitation. I am sending him to a hand therapist to improve his grip strength. He has no other neurologic symptoms.  His midline incision is well-healed without evidence of hernia.  The patient will come back to see me in 3 weeks for a wound check as well as for evaluation of his left hand.

## 2011-08-07 NOTE — Progress Notes (Signed)
Addended by: Mena Goes on: 08/07/2011 09:50 AM   Modules accepted: Orders

## 2011-08-10 NOTE — Addendum Note (Signed)
Addendum  created 08/10/11 1013 by Josephine Igo, CRNA   Modules edited:Anesthesia Events

## 2011-08-11 ENCOUNTER — Other Ambulatory Visit: Payer: Self-pay | Admitting: *Deleted

## 2011-08-11 DIAGNOSIS — R29898 Other symptoms and signs involving the musculoskeletal system: Secondary | ICD-10-CM

## 2011-08-14 ENCOUNTER — Ambulatory Visit: Payer: PRIVATE HEALTH INSURANCE | Admitting: Surgery

## 2011-08-15 ENCOUNTER — Ambulatory Visit: Payer: PRIVATE HEALTH INSURANCE | Admitting: Occupational Therapy

## 2011-08-25 ENCOUNTER — Encounter: Payer: Self-pay | Admitting: Surgery

## 2011-08-28 ENCOUNTER — Ambulatory Visit (INDEPENDENT_AMBULATORY_CARE_PROVIDER_SITE_OTHER): Payer: PRIVATE HEALTH INSURANCE | Admitting: Surgery

## 2011-08-28 ENCOUNTER — Encounter: Payer: Self-pay | Admitting: Surgery

## 2011-08-28 ENCOUNTER — Encounter (INDEPENDENT_AMBULATORY_CARE_PROVIDER_SITE_OTHER): Payer: PRIVATE HEALTH INSURANCE | Admitting: *Deleted

## 2011-08-28 VITALS — BP 146/83 | HR 89 | Resp 16 | Ht 66.0 in | Wt 176.0 lb

## 2011-08-28 DIAGNOSIS — I7092 Chronic total occlusion of artery of the extremities: Secondary | ICD-10-CM

## 2011-08-28 DIAGNOSIS — R200 Anesthesia of skin: Secondary | ICD-10-CM

## 2011-08-28 DIAGNOSIS — R209 Unspecified disturbances of skin sensation: Secondary | ICD-10-CM

## 2011-08-28 DIAGNOSIS — I739 Peripheral vascular disease, unspecified: Secondary | ICD-10-CM

## 2011-08-28 DIAGNOSIS — I70219 Atherosclerosis of native arteries of extremities with intermittent claudication, unspecified extremity: Secondary | ICD-10-CM

## 2011-08-28 DIAGNOSIS — M545 Low back pain: Secondary | ICD-10-CM

## 2011-08-28 DIAGNOSIS — Z48812 Encounter for surgical aftercare following surgery on the circulatory system: Secondary | ICD-10-CM

## 2011-08-28 NOTE — Progress Notes (Signed)
The patient comes in today for continued followup. He is status post aortobifemoral bypass grafting for right leg rest pain. This was done on January 17 thousand 13. He has known bilateral superficial femoral artery occlusion and severe tibial disease. He had been complaining of pain and numbness in his left hand. I was concerned that this may be a neurapraxia from positioning during the operation and referred him to hand therapist. Unfortunately insurance would not allow him to do this because he is also receiving home health for a left groin wound. He's back today for followup. He complains of some continued pain and numbness in his legs. The rest pain he was experiencing has resolved.  On physical examination he has palpable femoral pulses bilaterally. Pedal pulses are not palpable. The right groin wound has nearly healed. I cauterized with silver nitrate today.  I repeated his ankle brachial indices today they were 0.54 on the right and 0.60 on the left. These are improved from preop.  I'm concerned that the pain and numbness he is having in both legs may be due to lower back issues. He had an x-ray while in the hospital which showed degenerative lumbar disease. He tells me that he has suffered from this in the past. I am going to order x-rays of his lumbar spine and refer him back to his primary care physician for further followup. I am also going to re-refer him to the hand therapist for help with his left hand complaints.  His followup with me in one month

## 2011-08-28 NOTE — Progress Notes (Signed)
Addended by: Mena Goes on: 08/28/2011 12:20 PM   Modules accepted: Orders

## 2011-08-31 ENCOUNTER — Ambulatory Visit
Admission: RE | Admit: 2011-08-31 | Discharge: 2011-08-31 | Disposition: A | Discharge status: Home / Self Care | Payer: PRIVATE HEALTH INSURANCE | Source: Ambulatory Visit | Attending: Surgery | Admitting: Surgery

## 2011-08-31 DIAGNOSIS — M545 Low back pain: Secondary | ICD-10-CM

## 2011-09-01 ENCOUNTER — Ambulatory Visit: Payer: PRIVATE HEALTH INSURANCE | Attending: Surgery | Admitting: Occupational Therapy

## 2011-09-01 DIAGNOSIS — IMO0001 Reserved for inherently not codable concepts without codable children: Secondary | ICD-10-CM | POA: Insufficient documentation

## 2011-09-01 DIAGNOSIS — M25649 Stiffness of unspecified hand, not elsewhere classified: Secondary | ICD-10-CM | POA: Insufficient documentation

## 2011-09-01 DIAGNOSIS — R279 Unspecified lack of coordination: Secondary | ICD-10-CM | POA: Insufficient documentation

## 2011-09-01 DIAGNOSIS — M6281 Muscle weakness (generalized): Secondary | ICD-10-CM | POA: Insufficient documentation

## 2011-09-01 DIAGNOSIS — R209 Unspecified disturbances of skin sensation: Secondary | ICD-10-CM | POA: Insufficient documentation

## 2011-09-11 ENCOUNTER — Ambulatory Visit: Payer: PRIVATE HEALTH INSURANCE | Admitting: Surgery

## 2011-09-12 ENCOUNTER — Ambulatory Visit: Payer: PRIVATE HEALTH INSURANCE | Admitting: Occupational Therapy

## 2011-09-13 ENCOUNTER — Other Ambulatory Visit: Payer: Self-pay | Admitting: Physician Assistant

## 2011-09-13 ENCOUNTER — Other Ambulatory Visit: Payer: Self-pay | Admitting: Surgery

## 2011-09-19 ENCOUNTER — Encounter: Payer: PRIVATE HEALTH INSURANCE | Admitting: Occupational Therapy

## 2011-09-26 ENCOUNTER — Ambulatory Visit: Payer: PRIVATE HEALTH INSURANCE | Attending: Surgery | Admitting: Occupational Therapy

## 2011-09-26 DIAGNOSIS — M25649 Stiffness of unspecified hand, not elsewhere classified: Secondary | ICD-10-CM | POA: Insufficient documentation

## 2011-09-26 DIAGNOSIS — R279 Unspecified lack of coordination: Secondary | ICD-10-CM | POA: Insufficient documentation

## 2011-09-26 DIAGNOSIS — M6281 Muscle weakness (generalized): Secondary | ICD-10-CM | POA: Insufficient documentation

## 2011-09-26 DIAGNOSIS — R209 Unspecified disturbances of skin sensation: Secondary | ICD-10-CM | POA: Insufficient documentation

## 2011-09-26 DIAGNOSIS — IMO0001 Reserved for inherently not codable concepts without codable children: Secondary | ICD-10-CM | POA: Insufficient documentation

## 2011-09-29 ENCOUNTER — Encounter: Payer: Self-pay | Admitting: Surgery

## 2011-10-02 ENCOUNTER — Encounter: Payer: Self-pay | Admitting: Surgery

## 2011-10-02 ENCOUNTER — Ambulatory Visit (INDEPENDENT_AMBULATORY_CARE_PROVIDER_SITE_OTHER): Payer: PRIVATE HEALTH INSURANCE | Admitting: Vascular Surgery

## 2011-10-02 ENCOUNTER — Ambulatory Visit (INDEPENDENT_AMBULATORY_CARE_PROVIDER_SITE_OTHER): Payer: PRIVATE HEALTH INSURANCE | Admitting: Surgery

## 2011-10-02 VITALS — BP 143/80 | HR 86 | Temp 97.5°F | Resp 14 | Ht 66.0 in | Wt 178.0 lb

## 2011-10-02 DIAGNOSIS — I70219 Atherosclerosis of native arteries of extremities with intermittent claudication, unspecified extremity: Secondary | ICD-10-CM

## 2011-10-02 DIAGNOSIS — I739 Peripheral vascular disease, unspecified: Secondary | ICD-10-CM

## 2011-10-02 DIAGNOSIS — Z48812 Encounter for surgical aftercare following surgery on the circulatory system: Secondary | ICD-10-CM

## 2011-10-02 NOTE — Progress Notes (Signed)
ABI for S/P Aortobifemoral BPG completed @ [facility] 10/02/2011

## 2011-10-02 NOTE — Progress Notes (Signed)
Patient comes back today for followup. He is status post aortobifemoral bypass graft on 07/13/2011. This was done for rest pain in his right leg. 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 I have been a little concerned because of the pain and numbness he is having in his left hand. I felt that this may be a neurapraxia from positioning during the operating room. Alternatively, this could represent a stroke. His preoperative carotid Doppler showed no evidence of disease. He has seen a hand therapist and has been placed in a brace. This does help with his grip strength. Overall, his strength is minimally improved.  Today he is complaining of numbness or weakness in his right hand as well as tingling and numbness in his left leg. He had Dopplers today which showed an ABI of 0.5 none left in 0.43 on the right  On examination he does not have any ulceration.   I have requested that he get back to see his primary medical doctor for further evaluation of his complaints. I certainly think he could be suffering from a carpal tunnel issue. He states that he has seen a hand surgeon in the past. In may be worth revisiting this issue. In addition, I ordered lumbar x-rays because of his leg complaints. He does have a large osteophyte a minimal degenerative changes. His symptoms are not classic for arterial ischemia. At this point having recently undergone aortobifemoral bypass graft, I would be a little reluctant to push forward with femoral tibial bypass graft. I'm to have him come back in 6 months for further evaluation. We will consider angiography and further evaluation of the lower extremities at that time however I'm not sure this is his problem.

## 2011-10-03 ENCOUNTER — Encounter: Payer: PRIVATE HEALTH INSURANCE | Admitting: Occupational Therapy

## 2012-04-08 ENCOUNTER — Ambulatory Visit: Payer: PRIVATE HEALTH INSURANCE | Admitting: Surgery

## 2012-04-26 ENCOUNTER — Encounter: Payer: Self-pay | Admitting: Surgery

## 2012-04-29 ENCOUNTER — Ambulatory Visit (INDEPENDENT_AMBULATORY_CARE_PROVIDER_SITE_OTHER): Payer: PRIVATE HEALTH INSURANCE | Admitting: *Deleted

## 2012-04-29 ENCOUNTER — Ambulatory Visit (INDEPENDENT_AMBULATORY_CARE_PROVIDER_SITE_OTHER): Payer: Medicaid Other | Admitting: Surgery

## 2012-04-29 ENCOUNTER — Encounter: Payer: Self-pay | Admitting: Surgery

## 2012-04-29 VITALS — BP 164/95 | HR 66 | Resp 16 | Ht 66.0 in | Wt 176.6 lb

## 2012-04-29 DIAGNOSIS — Z48812 Encounter for surgical aftercare following surgery on the circulatory system: Secondary | ICD-10-CM

## 2012-04-29 DIAGNOSIS — I70219 Atherosclerosis of native arteries of extremities with intermittent claudication, unspecified extremity: Secondary | ICD-10-CM

## 2012-04-29 NOTE — Progress Notes (Signed)
[facility]    Patient name: Bobby Vega MRN: [id number] DOB: 1934-08-30 Sex: male     Chief Complaint  Patient presents with  . PVD    6 month f/u s/p aortobifem BPG 07/13/2011    HISTORY OF PRESENT ILLNESS: The patient is back today for followup. He is status post aortobifemoral bypass graft in January of 2013. This was done for right leg rest pain. His preoperative ankle brachial index was 0.2. He has known bilateral superficial femoral artery occlusion and severe tibial disease. He states that he has been doing well. His symptoms of rest pain have resolved. He still has some limitations with walking but this is not lifestyle limiting. He denies ulceration or rest pain.  Past Medical History  Diagnosis Date  . Hyperlipidemia     takes Zocor nightly  . BPH (benign prostatic hyperplasia)     takes Avodart daily  . Constipation     Metamucil prn  . Borderline diabetes   . Pain in joint, hand   . ED (erectile dysfunction)     takes Cialis as needed as well as Viagra  . GERD (gastroesophageal reflux disease)     takes Nexium daily  . Hypertension     takes Losartan daily  . Peripheral vascular disease   . Embolism - blood clot     in stomach  . Bronchitis     yrs ago  . Arthritis     hip  . Lumbar spondylosis with myelopathy   . Cervical spondylosis without myelopathy   . Cervical spondylosis with myelopathy   . Lumbar spondylosis with myelopathy   . Chronic back pain   . Diverticulosis   . Urinary urgency   . Nocturia     Past Surgical History  Procedure Date  . Skin graft 1983  . Aorta - bilateral femoral artery bypass graft 07/13/2011    Procedure: AORTA BIFEMORAL BYPASS GRAFT;  Surgeon: Theotis Burrow, MD;  Location: [place];  Service: Vascular;  Laterality: Bilateral;  . Cystoscopy 07/13/2011    Procedure: CYSTOSCOPY;  Surgeon: Hanley Ben, MD;  Location: [place];  Service: Urology;  Laterality: N/A;  cystoscopy,dilatation &  insertion of councill foley catheter    History   Social History  . Marital Status: Widowed    Spouse Name: N/A    Number of Children: N/A  . Years of Education: N/A   Occupational History  . Not on file.   Social History Main Topics  . Smoking status: Former Smoker -- .5 years    Types: Cigarettes, Cigars    Quit date: 04/29/1992  . Smokeless tobacco: Never Used  . Alcohol Use: 1.0 oz/week    2 drink(s) per week     Comment: red wine  . Drug Use: Yes    Special: Marijuana     Comment: couple of weeks ago  . Sexually Active: Yes   Other Topics Concern  . Not on file   Social History Narrative  . No narrative on file    Family History  Problem Relation Age of Onset  . Anesthesia problems Neg Hx   . Hypotension Neg Hx   . Malignant hyperthermia Neg Hx   . Pseudochol deficiency Neg Hx     Allergies as of 04/29/2012  . (No Known Allergies)    Current Outpatient Prescriptions on File Prior to Visit  Medication Sig Dispense Refill  . albuterol (PROVENTIL HFA;VENTOLIN HFA) 108 (90 BASE) MCG/ACT inhaler Inhale 2 puffs  into the lungs 4 (four) times daily as needed.        Marland Kitchen aspirin EC 81 MG tablet Take 81 mg by mouth daily.        Marland Kitchen dutasteride (AVODART) 0.5 MG capsule Take 0.5 mg by mouth daily.        Marland Kitchen esomeprazole (NEXIUM) 40 MG capsule Take 40 mg by mouth daily before breakfast.        . loratadine (CLARITIN) 10 MG tablet Take 10 mg by mouth daily.        Marland Kitchen losartan (COZAAR) 100 MG tablet Take 50 mg by mouth daily.        Marland Kitchen oxyCODONE (OXY IR/ROXICODONE) 5 MG immediate release tablet take 1 tablet by mouth every 4 to 6 hours if needed for pain  30 tablet  0  . oxyCODONE-acetaminophen (TYLOX) 5-500 MG per capsule take 1 to 2 capsules by mouth every 6 hours if needed  30 capsule  0  . pregabalin (LYRICA) 75 MG capsule Take 75 mg by mouth 3 (three) times daily.        . simvastatin (ZOCOR) 20 MG tablet Take 20 mg by mouth at bedtime.        . tadalafil (CIALIS) 20 MG  tablet Take 20 mg by mouth daily as needed. For erectile dysfuntion      . vitamin B-12 (CYANOCOBALAMIN) 250 MCG tablet Take 250 mcg by mouth daily.        . sildenafil (VIAGRA) 100 MG tablet Take 100 mg by mouth daily as needed. For erectile dysfunction          REVIEW OF SYSTEMS: Please see history of present illness, all other systems are negative as documented by the patient in the encounter form  PHYSICAL EXAMINATION:   Vital signs are BP 164/95  Pulse 66  Resp 16  Ht 5\' 6"  (1.676 m)  Wt 176 lb 9.6 oz (80.105 kg)  BMI 28.50 kg/m2  SpO2 100% General: The patient appears their stated age. HEENT:  No gross abnormalities Pulmonary:  Non labored breathing Abdomen: Soft and non-tender. Midline incision is well-healed without evidence of hernia. Both groin incisions are well-healed. Musculoskeletal: There are no major deformities. Neurologic: No focal weakness or paresthesias are detected, Skin: There are no ulcer or rashes noted. Psychiatric: The patient has normal affect. Cardiovascular: Palpable femoral pulses bilaterally   Diagnostic Studies Ultrasound was ordered and reviewed today. This shows his right ABI to be 0.48 and his left to be 0.59 to  Assessment: Status post aortobifemoral bypass graft. Plan: The patient is doing very well 6 months status post aortobifemoral bypass graft. His symptoms of rest pain have resolved. He still has mild symptoms of claudication, but they are certainly not lifestyle limiting. He has known outflow and runoff disease, and therefore future revascularizations will need to be for either rest pain or ulceration. At this point, I feel the patient is doing very well. I will have him come back in 6 months to see our nurse practitioner and to get routine followup the ultrasound surveillance protocol. He does not need carotid surveillance as his initial study was within normal limits.  Eldridge Abrahams, M.D. [facility]  [place] Office: [phone] Pager:  [phone]

## 2012-04-30 NOTE — Addendum Note (Signed)
Addended by: Mena Goes on: 04/30/2012 08:20 AM   Modules accepted: Orders

## 2012-10-28 ENCOUNTER — Ambulatory Visit: Payer: PRIVATE HEALTH INSURANCE | Admitting: Neurosurgery

## 2012-11-04 ENCOUNTER — Encounter (INDEPENDENT_AMBULATORY_CARE_PROVIDER_SITE_OTHER): Payer: Medicare HMO | Admitting: *Deleted

## 2012-11-04 ENCOUNTER — Ambulatory Visit: Payer: PRIVATE HEALTH INSURANCE | Admitting: Neurosurgery

## 2012-11-04 DIAGNOSIS — Z48812 Encounter for surgical aftercare following surgery on the circulatory system: Secondary | ICD-10-CM

## 2012-11-04 DIAGNOSIS — I70219 Atherosclerosis of native arteries of extremities with intermittent claudication, unspecified extremity: Secondary | ICD-10-CM

## 2012-11-05 ENCOUNTER — Other Ambulatory Visit: Payer: Self-pay | Admitting: *Deleted

## 2012-11-05 DIAGNOSIS — I70219 Atherosclerosis of native arteries of extremities with intermittent claudication, unspecified extremity: Secondary | ICD-10-CM

## 2012-11-05 DIAGNOSIS — Z48812 Encounter for surgical aftercare following surgery on the circulatory system: Secondary | ICD-10-CM

## 2012-11-07 ENCOUNTER — Encounter: Payer: Self-pay | Admitting: Surgery

## 2013-05-15 ENCOUNTER — Encounter (HOSPITAL_COMMUNITY): Payer: Self-pay | Admitting: Emergency Medicine

## 2013-05-15 ENCOUNTER — Emergency Department (HOSPITAL_COMMUNITY): Payer: Medicare HMO

## 2013-05-15 ENCOUNTER — Inpatient Hospital Stay (HOSPITAL_COMMUNITY)
Admission: EM | Admit: 2013-05-15 | Discharge: 2013-05-18 | DRG: 065 | Disposition: A | Discharge status: Home Health | Payer: Medicare HMO | Attending: Internal Medicine | Admitting: Internal Medicine

## 2013-05-15 DIAGNOSIS — I6350 Cerebral infarction due to unspecified occlusion or stenosis of unspecified cerebral artery: Principal | ICD-10-CM | POA: Diagnosis present

## 2013-05-15 DIAGNOSIS — E785 Hyperlipidemia, unspecified: Secondary | ICD-10-CM | POA: Diagnosis present

## 2013-05-15 DIAGNOSIS — N400 Enlarged prostate without lower urinary tract symptoms: Secondary | ICD-10-CM | POA: Diagnosis present

## 2013-05-15 DIAGNOSIS — I739 Peripheral vascular disease, unspecified: Secondary | ICD-10-CM | POA: Diagnosis present

## 2013-05-15 DIAGNOSIS — N390 Urinary tract infection, site not specified: Secondary | ICD-10-CM | POA: Diagnosis present

## 2013-05-15 DIAGNOSIS — Z87891 Personal history of nicotine dependence: Secondary | ICD-10-CM

## 2013-05-15 DIAGNOSIS — R42 Dizziness and giddiness: Secondary | ICD-10-CM

## 2013-05-15 DIAGNOSIS — K219 Gastro-esophageal reflux disease without esophagitis: Secondary | ICD-10-CM | POA: Diagnosis present

## 2013-05-15 DIAGNOSIS — N183 Chronic kidney disease, stage 3 unspecified: Secondary | ICD-10-CM | POA: Diagnosis present

## 2013-05-15 DIAGNOSIS — M1610 Unilateral primary osteoarthritis, unspecified hip: Secondary | ICD-10-CM | POA: Diagnosis present

## 2013-05-15 DIAGNOSIS — I129 Hypertensive chronic kidney disease with stage 1 through stage 4 chronic kidney disease, or unspecified chronic kidney disease: Secondary | ICD-10-CM | POA: Diagnosis present

## 2013-05-15 DIAGNOSIS — I639 Cerebral infarction, unspecified: Secondary | ICD-10-CM

## 2013-05-15 DIAGNOSIS — I70219 Atherosclerosis of native arteries of extremities with intermittent claudication, unspecified extremity: Secondary | ICD-10-CM

## 2013-05-15 DIAGNOSIS — E119 Type 2 diabetes mellitus without complications: Secondary | ICD-10-CM | POA: Diagnosis present

## 2013-05-15 DIAGNOSIS — I10 Essential (primary) hypertension: Secondary | ICD-10-CM | POA: Diagnosis present

## 2013-05-15 DIAGNOSIS — R29898 Other symptoms and signs involving the musculoskeletal system: Secondary | ICD-10-CM | POA: Diagnosis present

## 2013-05-15 DIAGNOSIS — M6281 Muscle weakness (generalized): Secondary | ICD-10-CM

## 2013-05-15 LAB — CBC
HCT: 43.1 % (ref 39.0–52.0)
Hemoglobin: 14.6 g/dL (ref 13.0–17.0)
MCH: 32.3 pg (ref 26.0–34.0)
MCHC: 33.9 g/dL (ref 30.0–36.0)
MCV: 95.4 fL (ref 78.0–100.0)
Platelets: 125 10*3/uL — ABNORMAL LOW (ref 150–400)
RBC: 4.52 MIL/uL (ref 4.22–5.81)
RDW: 14.9 % (ref 11.5–15.5)
WBC: 11.4 10*3/uL — ABNORMAL HIGH (ref 4.0–10.5)

## 2013-05-15 LAB — POCT I-STAT, CHEM 8
Calcium, Ion: 1.18 mmol/L (ref 1.13–1.30)
Glucose, Bld: 159 mg/dL — ABNORMAL HIGH (ref 70–99)
HCT: 48 % (ref 39.0–52.0)
Hemoglobin: 16.3 g/dL (ref 13.0–17.0)
Potassium: 4.7 mEq/L (ref 3.5–5.1)
TCO2: 22 mmol/L (ref 0–100)

## 2013-05-15 LAB — DIFFERENTIAL
Basophils Relative: 0 % (ref 0–1)
Eosinophils Absolute: 0 10*3/uL (ref 0.0–0.7)
Eosinophils Relative: 0 % (ref 0–5)
Lymphocytes Relative: 12 % (ref 12–46)
Lymphs Abs: 1.4 10*3/uL (ref 0.7–4.0)
Monocytes Absolute: 0.8 10*3/uL (ref 0.1–1.0)
Monocytes Relative: 7 % (ref 3–12)
Neutrophils Relative %: 80 % — ABNORMAL HIGH (ref 43–77)

## 2013-05-15 LAB — GLUCOSE, CAPILLARY: Glucose-Capillary: 135 mg/dL — ABNORMAL HIGH (ref 70–99)

## 2013-05-15 LAB — PROTIME-INR: INR: 1 (ref 0.00–1.49)

## 2013-05-15 LAB — TROPONIN I: Troponin I: <0.3 ng/mL (ref <0.30)

## 2013-05-15 LAB — ETHANOL: Alcohol, Ethyl (B): <11 mg/dL (ref 0–11)

## 2013-05-15 MED ORDER — ASPIRIN 325 MG PO TABS
325.0000 mg | ORAL_TABLET | Freq: Once | ORAL | Status: AC
  Administered 2013-05-15: 325 mg via ORAL
  Filled 2013-05-15: qty 1

## 2013-05-15 NOTE — ED Notes (Signed)
Per EMS, Patient LSN 1900. Patient experiencing right sided weakness. Weak right grip. No slurred speech and no facial droop. Patient is disoriented to time. Family states he has had an abnormal gait since 1900. CBG 147 BP 156/88 HR 77 98% on RA

## 2013-05-15 NOTE — Consult Note (Signed)
Referring Physician: Venora Maples    Chief Complaint: Dizziness and right arm weakness  HPI: Bobby Vega is a RH 78 y.o. male who is a very difficult historian.  It seems that he was sitting down watching television and talking with some friends.  When he attempted to get up to use the bathroom he was dizzy and required assistance.  At some point in time he fell to the floor and was there for some period of time.  It seems that his nephew called him and from there other family members were contacted and EMS was called.  Patient was brought in as a code stroke.  Patient is unable to give a reliable LKW.  He currently also complains of right arm pain, numbness and weakness but is unclear when this started.  It seems that he fell to the floor when it was light outside but that is as close as can be determined as to time.  It also seems that the patient had some vomiting due to the fact that dried vomitus is on his shirt but he is unable to give any information concerning this as well.    Date last known well: Date: 05/15/2013 Time last known well: Unable to determine tPA Given: No: Unable to determine LKW  Past Medical History  Diagnosis Date  . Hyperlipidemia     takes Zocor nightly  . BPH (benign prostatic hyperplasia)     takes Avodart daily  . Constipation     Metamucil prn  . Borderline diabetes   . Pain in joint, hand   . ED (erectile dysfunction)     takes Cialis as needed as well as Viagra  . GERD (gastroesophageal reflux disease)     takes Nexium daily  . Hypertension     takes Losartan daily  . Peripheral vascular disease   . Embolism - blood clot     in stomach  . Bronchitis     yrs ago  . Arthritis     hip  . Lumbar spondylosis with myelopathy   . Cervical spondylosis without myelopathy   . Cervical spondylosis with myelopathy   . Lumbar spondylosis with myelopathy   . Chronic back pain   . Diverticulosis   . Urinary urgency   . Nocturia     Past Surgical History   Procedure Laterality Date  . Skin graft  1983  . Aorta - bilateral femoral artery bypass graft  07/13/2011    Procedure: AORTA BIFEMORAL BYPASS GRAFT;  Surgeon: Theotis Burrow, MD;  Location: [place];  Service: Vascular;  Laterality: Bilateral;  . Cystoscopy  07/13/2011    Procedure: CYSTOSCOPY;  Surgeon: Hanley Ben, MD;  Location: [place];  Service: Urology;  Laterality: N/A;  cystoscopy,dilatation & insertion of councill foley catheter    Family History  Problem Relation Age of Onset  . Anesthesia problems Neg Hx   . Hypotension Neg Hx   . Malignant hyperthermia Neg Hx   . Pseudochol deficiency Neg Hx    Social History:  reports that he quit smoking about 21 years ago. His smoking use included Cigarettes and Cigars. He smoked 0.00 packs per day for .5 years. He has never used smokeless tobacco. He reports that he drinks about 1.0 ounces of alcohol per week. He reports that he uses illicit drugs (Marijuana).  Allergies: No Known Allergies  Medications: I have reviewed the patient's current medications. Prior to Admission:  Current outpatient prescriptions: esomeprazole (NEXIUM) 40 MG capsule, Take 40 mg by  mouth daily before breakfast. , Disp: , Rfl: ;   HYDROcodone-acetaminophen (NORCO/VICODIN) 5-325 MG per tablet, Take 1 tablet by mouth daily as needed (pain). , Disp: , Rfl: ;   losartan (COZAAR) 100 MG tablet, Take 50 mg by mouth daily. , Disp: , Rfl: ;   pregabalin (LYRICA) 75 MG capsule, Take 75 mg by mouth 3 (three) times daily. , Disp: , Rfl:  Vitamin D, Ergocalciferol, (DRISDOL) 50000 UNITS CAPS capsule, Take 50,000 Units by mouth every 7 (seven) days. Take on Friday or Saturday, Disp: , Rfl:   ROS: History obtained from the patient  General ROS: negative for - chills, fatigue, fever, night sweats, weight gain or weight loss Psychological ROS: negative for - behavioral disorder, hallucinations, memory difficulties, mood swings or suicidal ideation Ophthalmic ROS: negative  for - blurry vision, double vision, eye pain or loss of vision ENT ROS: negative for - epistaxis, nasal discharge, oral lesions, sore throat, tinnitus or vertigo Allergy and Immunology ROS: negative for - hives or itchy/watery eyes Hematological and Lymphatic ROS: negative for - bleeding problems, bruising or swollen lymph nodes Endocrine ROS: negative for - galactorrhea, hair pattern changes, polydipsia/polyuria or temperature intolerance Respiratory ROS: negative for - cough, hemoptysis, shortness of breath or wheezing Cardiovascular ROS: negative for - chest pain, dyspnea on exertion, edema or irregular heartbeat Gastrointestinal ROS: negative for - abdominal pain, diarrhea, hematemesis, nausea/vomiting or stool incontinence Genito-Urinary ROS: negative for - dysuria, hematuria, incontinence or urinary frequency/urgency Musculoskeletal ROS: right arm pain Neurological ROS: as noted in HPI Dermatological ROS: negative for rash and skin lesion changes  Physical Examination: Blood pressure 140/73, pulse 71, temperature 98.7 F (37.1 C), temperature source Oral, resp. rate 13, SpO2 100.00%.  Neurologic Examination: Mental Status: Alert but does most of the examination with his eyes closed.  Unable to tell me the month and that Thanksgiving is the next holiday.  Speech fluent without evidence of aphasia.  Able to follow 3 step commands without difficulty. Cranial Nerves: II: Discs flat bilaterally; Visual fields grossly normal, pupils equal, round, reactive to light and accommodation III,IV, VI: ptosis not present, extra-ocular motions intact bilaterally V,VII: smile symmetric, facial light touch sensation normal bilaterally VIII: hearing normal bilaterally IX,X: gag reflex present XI: bilateral shoulder shrug XII: midline tongue extension Motor: Right : Upper extremity   2-3/5 (fluctuates)    Left:     Upper extremity   5/5  Lower extremity   5/5       Lower extremity   5/5 Tone and  bulk:normal tone throughout; no atrophy noted Sensory: Unable to appreciate pinprick and light touch in the RUE.  Describes a right sensory neglect.   Deep Tendon Reflexes: 2+ in the upper extremities and absent in the lower extremities.   Plantars: Right: downgoing   Left: downgoing Cerebellar: normal finger-to-nose and normal heel-to-shin testing excluding the RUE Gait: Unable to test CV: pulses palpable throughout     Laboratory Studies:  Basic Metabolic Panel:  Recent Labs Lab 05/15/13 2243  NA 139  K 4.7  CL 108  GLUCOSE 159*  BUN 30*  CREATININE 2.50*    Liver Function Tests: No results found for this basename: AST, ALT, ALKPHOS, BILITOT, PROT, ALBUMIN,  in the last 168 hours No results found for this basename: LIPASE, AMYLASE,  in the last 168 hours No results found for this basename: AMMONIA,  in the last 168 hours  CBC:  Recent Labs Lab 05/15/13 2240 05/15/13 2243  WBC 11.4*  --  NEUTROABS 9.2*  --   HGB 14.6 16.3  HCT 43.1 48.0  MCV 95.4  --   PLT 125*  --     Cardiac Enzymes: No results found for this basename: CKTOTAL, CKMB, CKMBINDEX, TROPONINI,  in the last 168 hours  BNP: No components found with this basename: POCBNP,   CBG:  Recent Labs Lab 05/15/13 2246  [place]*    Microbiology: Results for orders placed during the hospital encounter of 07/12/11  SURGICAL PCR SCREEN     Status: Abnormal   Collection Time    07/12/11 11:55 AM      Result Value Range Status   MRSA, PCR POSITIVE (*) NEGATIVE Final   Staphylococcus aureus POSITIVE (*) NEGATIVE Final   Comment:            The Xpert SA Assay (FDA     approved for NASAL specimens     only), is one component of     a comprehensive surveillance     program.  It is not intended     to diagnose infection nor to     guide or monitor treatment.    Coagulation Studies:  Recent Labs  05/15/13 2240  LABPROT 13.0  INR 1.00    Urinalysis: No results found for this basename:  COLORURINE, APPERANCEUR, LABSPEC, PHURINE, GLUCOSEU, HGBUR, BILIRUBINUR, KETONESUR, PROTEINUR, UROBILINOGEN, NITRITE, LEUKOCYTESUR,  in the last 168 hours  Lipid Panel: No results found for this basename: chol,  trig,  hdl,  cholhdl,  vldl,  ldlcalc    HgbA1C:  No results found for this basename: HGBA1C    Urine Drug Screen:   No results found for this basename: labopia,  cocainscrnur,  labbenz,  amphetmu,  thcu,  labbarb    Alcohol Level: No results found for this basename: ETH,  in the last 168 hours  Other results: EKG: sinus rhythm at 81 bpm.  Imaging: Ct Head Wo Contrast  05/15/2013   CLINICAL DATA:  Code stroke.  Flaccid right arm.  EXAM: CT HEAD WITHOUT CONTRAST  TECHNIQUE: Contiguous axial images were obtained from the base of the skull through the vertex without intravenous contrast.  COMPARISON:  None.  FINDINGS: Skull and Sinuses:No significant abnormality.  Orbits: Bilateral cataract resection.  Brain: No evidence of acute abnormality, such as acute gray matter infarction, hemorrhage, hydrocephalus, or mass lesion/mass effect.  Remote appearing small vessel infarct in the peripheral bilateral cerebellum. Indistinct low-attenuation in the lower left cerebellum with suggestion of volume long. Small low-attenuation focus in the ventral right cerebrum is most likely a dilated perivascular space given proximity to the lower sylvian fissure. There is patchy bilateral cerebral white matter low-attenuation, nonspecific. Cerebral volume loss.  These results were called by telephone at the time of interpretation on 05/15/2013 at 10:54 PM to Dr. Venora Maples, who verbally acknowledged these results.  IMPRESSION: 1. Negative for acute intracranial hemorrhage. No evidence of large vessel territory infarction. 2. Small vessel ischemic white matter disease. No prior to establish stability. 3. Bilateral cerebellar infarcts, remote based on the reported deficit.   Electronically Signed   By: Jorje Guild M.D.   On: 05/15/2013 22:57    Assessment: 78 y.o. male presenting with RUE weakness and numbness.  Patient also with complaints of dizziness as well.  Head CT reviewed and shows no acute changes.  With no clear LKW patient unable to receive tPA although with multiple vascular risk factors.  Patient also so unclear about history that can not rule out  the possibility of a seizure as well.    Stroke Risk Factors - diabetes mellitus, hyperlipidemia and hypertension  Plan: 1. HgbA1c, fasting lipid panel 2. MRI, MRA  of the brain without contrast 3. PT consult, OT consult, Speech consult 4. Echocardiogram 5. Carotid dopplers 6. Prophylactic therapy-Antiplatelet med: Aspirin - dose 325mg  daily 7. Risk factor modification 8. Telemetry monitoring 9. Frequent neuro checks 10. EEG  Case discussed with Dr. Kerrin Champagne, MD [facility] [phone] 05/15/2013, 11:19 PM

## 2013-05-16 ENCOUNTER — Observation Stay (HOSPITAL_COMMUNITY): Payer: Medicare HMO

## 2013-05-16 ENCOUNTER — Encounter (HOSPITAL_COMMUNITY): Payer: Self-pay

## 2013-05-16 DIAGNOSIS — K219 Gastro-esophageal reflux disease without esophagitis: Secondary | ICD-10-CM | POA: Diagnosis present

## 2013-05-16 DIAGNOSIS — I739 Peripheral vascular disease, unspecified: Secondary | ICD-10-CM

## 2013-05-16 DIAGNOSIS — I517 Cardiomegaly: Secondary | ICD-10-CM

## 2013-05-16 DIAGNOSIS — E785 Hyperlipidemia, unspecified: Secondary | ICD-10-CM | POA: Diagnosis present

## 2013-05-16 DIAGNOSIS — G459 Transient cerebral ischemic attack, unspecified: Secondary | ICD-10-CM

## 2013-05-16 DIAGNOSIS — I10 Essential (primary) hypertension: Secondary | ICD-10-CM

## 2013-05-16 LAB — COMPREHENSIVE METABOLIC PANEL
ALT: 6 U/L (ref 0–53)
ALT: 6 U/L (ref 0–53)
Alkaline Phosphatase: 111 U/L (ref 39–117)
BUN: 25 mg/dL — ABNORMAL HIGH (ref 6–23)
CO2: 14 mEq/L — ABNORMAL LOW (ref 19–32)
CO2: 21 mEq/L (ref 19–32)
Calcium: 9.1 mg/dL (ref 8.4–10.5)
Calcium: 8.7 mg/dL (ref 8.4–10.5)
Creatinine, Ser: 1.85 mg/dL — ABNORMAL HIGH (ref 0.50–1.35)
GFR calc Af Amer: 32 mL/min — ABNORMAL LOW (ref >90)
GFR calc Af Amer: 39 mL/min — ABNORMAL LOW (ref >90)
GFR calc non Af Amer: 27 mL/min — ABNORMAL LOW (ref >90)
GFR calc non Af Amer: 34 mL/min — ABNORMAL LOW (ref >90)
Glucose, Bld: 154 mg/dL — ABNORMAL HIGH (ref 70–99)
Glucose, Bld: 126 mg/dL — ABNORMAL HIGH (ref 70–99)
Potassium: 4.8 mEq/L (ref 3.5–5.1)
Sodium: 133 mEq/L — ABNORMAL LOW (ref 135–145)
Total Protein: 7.6 g/dL (ref 6.0–8.3)

## 2013-05-16 LAB — CBC WITH DIFFERENTIAL/PLATELET
Basophils Relative: 1 % (ref 0–1)
Eosinophils Relative: 1 % (ref 0–5)
HCT: 39.4 % (ref 39.0–52.0)
Lymphocytes Relative: 29 % (ref 12–46)
Lymphs Abs: 2.4 10*3/uL (ref 0.7–4.0)
MCV: 94.9 fL (ref 78.0–100.0)
Monocytes Absolute: 0.8 10*3/uL (ref 0.1–1.0)
Monocytes Relative: 9 % (ref 3–12)
Neutrophils Relative %: 60 % (ref 43–77)
RBC: 4.15 MIL/uL — ABNORMAL LOW (ref 4.22–5.81)
WBC: 8.1 10*3/uL (ref 4.0–10.5)

## 2013-05-16 LAB — GLUCOSE, CAPILLARY
Glucose-Capillary: 108 mg/dL — ABNORMAL HIGH (ref 70–99)
Glucose-Capillary: 84 mg/dL (ref 70–99)

## 2013-05-16 LAB — LIPID PANEL
HDL: 28 mg/dL — ABNORMAL LOW (ref >39)
LDL Cholesterol: 197 mg/dL — ABNORMAL HIGH (ref 0–99)
VLDL: 38 mg/dL (ref 0–40)

## 2013-05-16 LAB — HEMOGLOBIN A1C
Hgb A1c MFr Bld: 6 % — ABNORMAL HIGH (ref <5.7)
Mean Plasma Glucose: 126 mg/dL — ABNORMAL HIGH (ref <117)

## 2013-05-16 LAB — MRSA PCR SCREENING: MRSA by PCR: NEGATIVE

## 2013-05-16 LAB — PROTIME-INR
INR: 1.08 (ref 0.00–1.49)
Prothrombin Time: 13.8 seconds (ref 11.6–15.2)

## 2013-05-16 MED ORDER — STROKE: EARLY STAGES OF RECOVERY BOOK
Freq: Once | Status: AC
  Administered 2013-05-16: 06:00:00
  Filled 2013-05-16: qty 1

## 2013-05-16 MED ORDER — ATORVASTATIN CALCIUM 20 MG PO TABS
20.0000 mg | ORAL_TABLET | Freq: Every day | ORAL | Status: DC
  Administered 2013-05-16 – 2013-05-17 (×2): 20 mg via ORAL
  Filled 2013-05-16 (×3): qty 1

## 2013-05-16 MED ORDER — HYDROCODONE-ACETAMINOPHEN 5-325 MG PO TABS: 1.0000 | ORAL_TABLET | Freq: Every day | ORAL | Status: DC | PRN

## 2013-05-16 MED ORDER — HEPARIN SODIUM (PORCINE) 5000 UNIT/ML IJ SOLN
5000.0000 [IU] | Freq: Three times a day (TID) | INTRAMUSCULAR | Status: DC
  Administered 2013-05-16 – 2013-05-18 (×7): 5000 [IU] via SUBCUTANEOUS
  Filled 2013-05-16 (×10): qty 1

## 2013-05-16 MED ORDER — ASPIRIN 325 MG PO TABS
325.0000 mg | ORAL_TABLET | Freq: Every day | ORAL | Status: DC
  Administered 2013-05-16 – 2013-05-18 (×3): 325 mg via ORAL
  Filled 2013-05-16 (×3): qty 1

## 2013-05-16 MED ORDER — PREGABALIN 50 MG PO CAPS
75.0000 mg | ORAL_CAPSULE | Freq: Three times a day (TID) | ORAL | Status: DC
  Administered 2013-05-16 – 2013-05-18 (×7): 75 mg via ORAL
  Filled 2013-05-16 (×14): qty 1

## 2013-05-16 MED ORDER — PANTOPRAZOLE SODIUM 40 MG PO TBEC
40.0000 mg | DELAYED_RELEASE_TABLET | Freq: Every day | ORAL | Status: DC
  Administered 2013-05-16 – 2013-05-18 (×3): 40 mg via ORAL
  Filled 2013-05-16 (×3): qty 1

## 2013-05-16 MED ORDER — LOSARTAN POTASSIUM 50 MG PO TABS
50.0000 mg | ORAL_TABLET | Freq: Every day | ORAL | Status: DC
  Administered 2013-05-16 – 2013-05-17 (×2): 50 mg via ORAL
  Filled 2013-05-16 (×2): qty 1

## 2013-05-16 NOTE — H&P (Signed)
[facility] History and Physical  Patient: Bobby Vega  [id number]  DOB: 01/28/1935  DOS: the patient was seen and examined on 05/16/2013 PCP: Philis Fendt, MD  Chief Complaint: Right-sided weakness  HPI: [name] is a 78 y.o. male with Past medical history of dyslipidemia, GERD, hypertension, PVD, spinal spondylosis. The patient is coming from home. The patient is presenting with the complaint of right-sided weakness. He mentions that today while he was at home in the afternoon he felt sick to his, and had an episode of nausea and vomiting at the same time he also had some dizziness and lightheadedness when he was going to the restroom. There was also a history from the family that today he was found on the ground and could not lift him up as he was also on home for almost 2 hours and the family arrived they had him in assisted him. It is unclear off the timing of his fall. But denies any headache, chest pain,, fever, chills, nausea, vomiting, abdominal pain, shortness of breath, burning urination, diarrhea at present. He continues to complain of bright sided weakness. He denies any significant alcohol abuse and mention that he was occasionally drinking today.  Review of Systems: as mentioned in the history of present illness.  A Comprehensive review of the other systems is negative.  Past Medical History  Diagnosis Date  . Hyperlipidemia     takes Zocor nightly  . BPH (benign prostatic hyperplasia)     takes Avodart daily  . Constipation     Metamucil prn  . Borderline diabetes   . Pain in joint, hand   . ED (erectile dysfunction)     takes Cialis as needed as well as Viagra  . GERD (gastroesophageal reflux disease)     takes Nexium daily  . Hypertension     takes Losartan daily  . Peripheral vascular disease   . Embolism - blood clot     in stomach  . Bronchitis     yrs ago  . Arthritis     hip  . Lumbar spondylosis with myelopathy   . Cervical  spondylosis without myelopathy   . Cervical spondylosis with myelopathy   . Lumbar spondylosis with myelopathy   . Chronic back pain   . Diverticulosis   . Urinary urgency   . Nocturia    Past Surgical History  Procedure Laterality Date  . Skin graft  1983  . Aorta - bilateral femoral artery bypass graft  07/13/2011    Procedure: AORTA BIFEMORAL BYPASS GRAFT;  Surgeon: Theotis Burrow, MD;  Location: [facility];  Service: Vascular;  Laterality: Bilateral;  . Cystoscopy  07/13/2011    Procedure: CYSTOSCOPY;  Surgeon: Hanley Ben, MD;  Location: [facility];  Service: Urology;  Laterality: N/A;  cystoscopy,dilatation & insertion of councill foley catheter   Social History:  reports that he quit smoking about 21 years ago. His smoking use included Cigarettes and Cigars. He smoked 0.00 packs per day for .5 years. He has never used smokeless tobacco. He reports that he drinks about 1.0 ounces of alcohol per week. He reports that he uses illicit drugs (Marijuana). Independent for most of his  ADL.  No Known Allergies  Family History  Problem Relation Age of Onset  . Anesthesia problems Neg Hx   . Hypotension Neg Hx   . Malignant hyperthermia Neg Hx   . Pseudochol deficiency Neg Hx     Prior to Admission medications   Medication Sig Start Date  End Date Taking? Authorizing Provider  esomeprazole (NEXIUM) 40 MG capsule Take 40 mg by mouth daily before breakfast.    Yes Historical Provider, MD  HYDROcodone-acetaminophen (NORCO/VICODIN) 5-325 MG per tablet Take 1 tablet by mouth daily as needed (pain).  03/13/13  Yes Historical Provider, MD  losartan (COZAAR) 100 MG tablet Take 50 mg by mouth daily.    Yes Historical Provider, MD  pregabalin (LYRICA) 75 MG capsule Take 75 mg by mouth 3 (three) times daily.    Yes Historical Provider, MD  Vitamin D, Ergocalciferol, (DRISDOL) 50000 UNITS CAPS capsule Take 50,000 Units by mouth every 7 (seven) days. Take on Friday or Saturday   Yes Historical Provider, MD     Physical Exam: Filed Vitals:   05/16/13 0055 05/16/13 0110 05/16/13 0125 05/16/13 0212  BP: 152/68 156/68 156/68 164/69  Pulse: 59 62 67 58  Temp:    97.7 F (36.5 C)  TempSrc:    Oral  Resp: 12 14 14    Height:    5\' 6"  (1.676 m)  Weight:    75.705 kg (166 lb 14.4 oz)  SpO2: 99% 99% 99% 100%    General: Alert, Awake and Oriented to Time, Place and Person. Appear in mild distress Eyes: PERRL ENT: Oral Mucosa clear moist. Neck: No JVD Cardiovascular: S1 and S2 Present, no Murmur, Peripheral Pulses Present Respiratory: Bilateral Air entry equal and Decreased, Clear to Auscultation,  No Crackles, no wheezes Abdomen: Bowel Sound Present, Soft and Non tender Skin: No Rash Extremities: No Pedal edema, no calf tenderness Neurologic: Grossly Unremarkable.Mental status alert awake and oriented appropriate speech and attention, Cranial Nerves pupils are reactive, good cough reflex, Motor strength bilaterally symmetrical in leg, right-sided weakness 1/5, Sensation loss on right side light touch presents to painful stimuli, reflexes intact bilaterally, babinski negative, Proprioception equivocal bilaterally.  Labs on Admission:  CBC:  Recent Labs Lab 05/15/13 2240 05/15/13 2243  WBC 11.4*  --   NEUTROABS 9.2*  --   HGB 14.6 16.3  HCT 43.1 48.0  MCV 95.4  --   PLT 125*  --     CMP     Component Value Date/Time   NA 139 05/15/2013 2243   K 4.7 05/15/2013 2243   CL 108 05/15/2013 2243   CO2 14* 05/15/2013 2240   GLUCOSE 159* 05/15/2013 2243   BUN 30* 05/15/2013 2243   CREATININE 2.50* 05/15/2013 2243   CALCIUM 9.1 05/15/2013 2240   PROT 7.6 05/15/2013 2240   ALBUMIN 3.7 05/15/2013 2240   AST 14 05/15/2013 2240   ALT 6 05/15/2013 2240   ALKPHOS 111 05/15/2013 2240   BILITOT 0.2* 05/15/2013 2240   GFRNONAA 27* 05/15/2013 2240   GFRAA 32* 05/15/2013 2240    No results found for this basename: LIPASE, AMYLASE,  in the last 168 hours No results found for this basename:  AMMONIA,  in the last 168 hours   Recent Labs Lab 05/15/13 2240  TROPONINI <0.30   BNP (last 3 results) No results found for this basename: PROBNP,  in the last 8760 hours  Radiological Exams on Admission: Ct Head Wo Contrast  05/15/2013   CLINICAL DATA:  Code stroke.  Flaccid right arm.  EXAM: CT HEAD WITHOUT CONTRAST  TECHNIQUE: Contiguous axial images were obtained from the base of the skull through the vertex without intravenous contrast.  COMPARISON:  None.  FINDINGS: Skull and Sinuses:No significant abnormality.  Orbits: Bilateral cataract resection.  Brain: No evidence of acute abnormality, such as acute  gray matter infarction, hemorrhage, hydrocephalus, or mass lesion/mass effect.  Remote appearing small vessel infarct in the peripheral bilateral cerebellum. Indistinct low-attenuation in the lower left cerebellum with suggestion of volume long. Small low-attenuation focus in the ventral right cerebrum is most likely a dilated perivascular space given proximity to the lower sylvian fissure. There is patchy bilateral cerebral white matter low-attenuation, nonspecific. Cerebral volume loss.  These results were called by telephone at the time of interpretation on 05/15/2013 at 10:54 PM to Dr. Venora Maples, who verbally acknowledged these results.  IMPRESSION: 1. Negative for acute intracranial hemorrhage. No evidence of large vessel territory infarction. 2. Small vessel ischemic white matter disease. No prior to establish stability. 3. Bilateral cerebellar infarcts, remote based on the reported deficit.   Electronically Signed   By: Jorje Guild M.D.   On: 05/15/2013 22:57    EKG: Independently reviewed. normal sinus rhythm.  Assessment/Plan Principal Problem:   Right arm weakness Active Problems:   PVD (peripheral vascular disease) with claudication   GERD (gastroesophageal reflux disease)   Hypertension   1. Right arm weakness The patient is presenting with right-sided weakness only  involving the upper extremity without any other lateralizing signs. His initial CT scan EKG troponin all are negative. His lab work is also not showing any significant abnormality. He's hemodynamically stable. Possibility of current presentation could involve stroke versus TIA versus seizure. Is not a candidate for TPA as last known well to time is not known. Wall pain and MRI/MRA PTOT consult echocardiogram Doppler carotid I would start him on aspirin 325 Would continue him on Lipitor  Check hemoglobin A1c, frequent neuro checks , telemetry monitoring , EEG in the morning.  2. GERD Continue Protonix  3. Hypertension Continue home antihypertensives   Consults: Neurology   DVT Prophylaxis: subcutaneous Heparin Nutrition: As tolerated cardiac   Code Status: Full   Family Communication: Family  was present at bedside, opportunity was given to ask question and all questions were answered satisfactorily at the time of interview. Disposition: Admitted to observation in telemetry unit.  Author: Berle Mull, MD [facility] Pager: [phone] 05/16/2013, 4:19 AM    If 7PM-7AM, please contact night-coverage [email or web address] Password [email or web address]

## 2013-05-16 NOTE — Procedures (Signed)
EEG report.  Brief clinical history: 78 y/o presenting with the complaint of right-sided weakness  Technique: this is a 17 channel routine scalp EEG performed at the bedside with bipolar and monopolar montages arranged in accordance to the international 10/20 system of electrode placement. One channel was dedicated to EKG recording.  The study was performed during wakefulness, drowsiness, and stage 2 sleep. Intermittent photic stimulation was utilized as activating procedure.  Description:In the wakeful state, the best background consisted of a low amplitude, posterior dominant, poorly sustained, symmetric and reactive 9 Hz rhythm. Drowsiness demonstrated dropout of the alpha rhythm. Stage 2 sleep showed symmetric and synchronous sleep spindles without intermixed epileptiform discharges. Intermittent photic stimulation did induce a driving response.  No focal or generalized epileptiform discharges noted.  No slowing seen.  EKG showed sinus rhythm.  Impression: this is a normal low voltage awake and asleep EEG. Please, be aware that a normal EEG does not exclude the possibility of epilepsy.  Clinical correlation is advised.  Dorian Pod, MD

## 2013-05-16 NOTE — Care Management Note (Unsigned)
    Page 1 of 1   05/16/2013     2:12:14 PM   CARE MANAGEMENT NOTE 05/16/2013  Patient:  Bobby Vega, Bobby Vega   Account Number:  [id number]  Date Initiated:  05/16/2013  Documentation initiated by:  GRAVES-BIGELOW,Renley Gutman  Subjective/Objective Assessment:   Pt admitted for Right-sided weakness. His initial CT scan EKG troponin all are negative. Plan for MRI.     Action/Plan:   CM will continue to monitor for disposition needs.   Anticipated DC Date:  05/17/2013   Anticipated DC Plan:  [place]  CM consult      Choice offered to / List presented to:             Status of service:  In process, will continue to follow Medicare Important Message given?   (If response is "NO", the following Medicare IM given date fields will be blank) Date Medicare IM given:   Date Additional Medicare IM given:    Discharge Disposition:    Per UR Regulation:  Reviewed for med. necessity/level of care/duration of stay  If discussed at [place] of Stay Meetings, dates discussed:    Comments:

## 2013-05-16 NOTE — Progress Notes (Signed)
Patient ID: Bobby Vega  male  [id number]    DOB: 1934-10-17    DOA: 05/15/2013  PCP: Philis Fendt, MD  Assessment/Plan: Principal Problem:   Right arm weakness - Stroke workup in progress, MRI/MRA, 2-D echo, carotid Dopplers pending - Continue aspirin 325 mg daily, PT, OT consult - Tolerating regular diet started on Lipitor,  - EEG, neurology consulted - Lipid panel showed cholesterol 263, triglycerides 189, LDL 197   Active Problems:   PVD (peripheral vascular disease) with claudication - Continue aspirin    GERD (gastroesophageal reflux disease) - Continue PPI    Hypertension - Continue losartan    Other and unspecified hyperlipidemia - Started on Lipitor   DVT Prophylaxis:Heparin subcutaneous   Code Status:  Disposition:Not medically ready    Subjective: Still having right arm weakness, no chest pain, shortness of breath, fever or chills   Objective: Weight change:  No intake or output data in the 24 hours ending 05/16/13 0855 Blood pressure 154/65, pulse 63, temperature 97.7 F (36.5 C), temperature source Oral, resp. rate 14, height 5\' 6"  (1.676 m), weight 75.705 kg (166 lb 14.4 oz), SpO2 99.00%.  Physical Exam: General: Alert and awake, oriented x3, not in any acute distress. HEENT: anicteric sclera, PERLA, EOMI CVS: S1-S2 clear, no murmur rubs or gallops Chest: clear to auscultation bilaterally, no wheezing, rales or rhonchi Abdomen: soft nontender, nondistended, normal bowel sounds  Extremities: no cyanosis, clubbing or edema noted bilaterally Neuro: Cranial nerves II-XII intact, no dysarthria, right upper extremity  2/5, sensation decreases, right and left lower extremity 5/5  Lab Results: Basic Metabolic Panel:  Recent Labs Lab 05/15/13 2240 05/15/13 2243 05/16/13 0622  NA 133* 139 138  K 4.8 4.7 4.0  CL 101 108 105  CO2 14*  --  21  GLUCOSE 154* 159* 126*  BUN 25* 30* 25*  CREATININE 2.21* 2.50* 1.85*  CALCIUM 9.1  --  8.7    Liver Function Tests:  Recent Labs Lab 05/15/13 2240 05/16/13 0622  AST 14 12  ALT 6 6  ALKPHOS 111 109  BILITOT 0.2* 0.2*  PROT 7.6 6.8  ALBUMIN 3.7 3.6   No results found for this basename: LIPASE, AMYLASE,  in the last 168 hours No results found for this basename: AMMONIA,  in the last 168 hours CBC:  Recent Labs Lab 05/15/13 2240 05/15/13 2243 05/16/13 0622  WBC 11.4*  --  8.1  NEUTROABS 9.2*  --  4.8  HGB 14.6 16.3 13.1  HCT 43.1 48.0 39.4  MCV 95.4  --  94.9  PLT 125*  --  PENDING   Cardiac Enzymes:  Recent Labs Lab 05/15/13 2240  TROPONINI <0.30   BNP: No components found with this basename: POCBNP,  CBG:  Recent Labs Lab 05/15/13 2246  GLUCAP 135*     Micro Results: Recent Results (from the past 240 hour(s))  MRSA PCR SCREENING     Status: None   Collection Time    05/16/13  2:16 AM      Result Value Range Status   MRSA by PCR NEGATIVE  NEGATIVE Final   Comment:            The GeneXpert MRSA Assay (FDA     approved for NASAL specimens     only), is one component of a     comprehensive MRSA colonization     surveillance program. It is not     intended to diagnose MRSA     infection nor to guide  or     monitor treatment for     MRSA infections.    Studies/Results: Ct Head Wo Contrast  05/15/2013   CLINICAL DATA:  Code stroke.  Flaccid right arm.  EXAM: CT HEAD WITHOUT CONTRAST  TECHNIQUE: Contiguous axial images were obtained from the base of the skull through the vertex without intravenous contrast.  COMPARISON:  None.  FINDINGS: Skull and Sinuses:No significant abnormality.  Orbits: Bilateral cataract resection.  Brain: No evidence of acute abnormality, such as acute gray matter infarction, hemorrhage, hydrocephalus, or mass lesion/mass effect.  Remote appearing small vessel infarct in the peripheral bilateral cerebellum. Indistinct low-attenuation in the lower left cerebellum with suggestion of volume long. Small low-attenuation focus  in the ventral right cerebrum is most likely a dilated perivascular space given proximity to the lower sylvian fissure. There is patchy bilateral cerebral white matter low-attenuation, nonspecific. Cerebral volume loss.  These results were called by telephone at the time of interpretation on 05/15/2013 at 10:54 PM to Dr. Venora Maples, who verbally acknowledged these results.  IMPRESSION: 1. Negative for acute intracranial hemorrhage. No evidence of large vessel territory infarction. 2. Small vessel ischemic white matter disease. No prior to establish stability. 3. Bilateral cerebellar infarcts, remote based on the reported deficit.   Electronically Signed   By: Jorje Guild M.D.   On: 05/15/2013 22:57    Medications: Scheduled Meds: . aspirin  325 mg Oral Daily  . heparin  5,000 Units Subcutaneous Q8H  . losartan  50 mg Oral Daily  . pantoprazole  40 mg Oral Daily  . pregabalin  75 mg Oral TID      LOS: 1 day   Yeslin Delio M.D. [facility] 05/16/2013, 8:55 AM Pager: [phone]  If 7PM-7AM, please contact night-coverage [email or web address] Password [email or web address]

## 2013-05-16 NOTE — Progress Notes (Signed)
UR completed 

## 2013-05-16 NOTE — Progress Notes (Signed)
  Echocardiogram 2D Echocardiogram has been performed.  Bobby Vega 05/16/2013, 10:41 AM

## 2013-05-16 NOTE — Progress Notes (Signed)
EEG completed, results pending. 

## 2013-05-16 NOTE — Progress Notes (Signed)
Nutrition Brief Note  Patient identified on the Malnutrition Screening Tool (MST) Report  Pt denies any recent weight changes. Usual weight is 160-170 lb per pt. Pt reports good appetite PTA. Family at bedside.   Wt Readings from Last 15 Encounters:  05/16/13 171 lb (77.565 kg)  04/29/12 176 lb 9.6 oz (80.105 kg)  10/02/11 178 lb (80.74 kg)  08/28/11 176 lb (79.833 kg)  08/07/11 173 lb (78.472 kg)  08/02/11 186 lb (84.369 kg)  07/18/11 186 lb 15.2 oz (84.8 kg)  07/18/11 186 lb 15.2 oz (84.8 kg)  07/12/11 186 lb 15.2 oz (84.8 kg)  07/10/11 190 lb (86.183 kg)  07/04/11 177 lb (80.287 kg)  07/04/11 177 lb (80.287 kg)  06/26/11 189 lb (85.73 kg)    Body mass index is 27.61 kg/(m^2). Patient meets criteria for overweight based on current BMI.   Current diet order is Heart Healthy, patient is consuming approximately 100% of meals at this time. Labs and medications reviewed.   No nutrition interventions warranted at this time. If nutrition issues arise, please consult RD.   [place], [place], [place] Pager [phone] After Hours Pager

## 2013-05-16 NOTE — Evaluation (Signed)
Physical Therapy Evaluation Patient Details Name: Bobby Vega MRN: [id number] DOB: [birth year] Today's Date: 05/16/2013 Time: [id number] PT Time Calculation (min): 15 min  PT Assessment / Plan / Recommendation History of Present Illness  Bobby Vega is a 78 y.o. male with Past medical history of dyslipidemia, GERD, hypertension, PVD, spinal spondylosis.  Patient admitted with weakness right arm.  Currently being worked up for stroke vs TIA.  Clinical Impression  Patient presents with weakness right UE.  Patient is independent for ambulation and transfers.  Did require assistance of rails for bed mobility.  Patient will need follow up therapy for RUE (recommend f/u OT, however, depending on where he receives services, can be PT).  No further acute care needs identified - OT will follow in hospital for RUE.  PT will sign off at this time.      PT Assessment  All further PT needs can be met in the next venue of care    Follow Up Recommendations  Outpatient PT (or Outpatient OT)    Does the patient have the potential to tolerate intense rehabilitation      Barriers to Discharge        Equipment Recommendations  None recommended by PT    Recommendations for Other Services     Frequency      Precautions / Restrictions Precautions Precautions: None Precaution Comments: extremely weak right arm   Pertinent Vitals/Pain No pain      Mobility  Bed Mobility Bed Mobility: Supine to Sit;Sit to Supine Supine to Sit: 6: Modified independent (Device/Increase time);With rails;HOB flat Sit to Supine: 6: Modified independent (Device/Increase time);With rail;HOB flat Transfers Transfers: Sit to Stand;Stand to Sit Sit to Stand: 7: Independent;Without upper extremity assist;From bed Stand to Sit: 7: Independent;Without upper extremity assist;To bed Ambulation/Gait Ambulation/Gait Assistance: 7: Independent Ambulation Distance (Feet): 150 Feet Assistive device: None Gait Pattern:  Within Functional Limits Modified Rankin (Stroke Patients Only) Pre-Morbid Rankin Score: No symptoms Modified Rankin: Moderate disability    Exercises     PT Diagnosis: Hemiplegia dominant side  PT Problem List: Decreased strength PT Treatment Interventions:       PT Goals(Current goals can be found in the care plan section) Acute Rehab PT Goals PT Goal Formulation: No goals set, d/c therapy  Visit Information  Last PT Received On: 05/16/13 Assistance Needed: +1 History of Present Illness: Bobby Vega is a 78 y.o. male with Past medical history of dyslipidemia, GERD, hypertension, PVD, spinal spondylosis.  Patient admitted with weakness right arm.  Currently being worked up for stroke vs TIA.       [place] expects to be discharged to:: Private residence Living Arrangements: Alone Available Help at Discharge: Family Type of Home: House Home Access: Stairs to enter Technical brewer of Steps: 3 Entrance Stairs-Rails: None Home Layout: One level Home Equipment: None Prior Function Level of Independence: Independent Communication Communication: No difficulties    Cognition  Cognition Arousal/Alertness: Awake/alert Behavior During Therapy: WFL for tasks assessed/performed Overall Cognitive Status: Within Functional Limits for tasks assessed    Extremity/Trunk Assessment Upper Extremity Assessment Upper Extremity Assessment: Defer to OT evaluation Lower Extremity Assessment Lower Extremity Assessment: Overall WFL for tasks assessed   Balance Balance Balance Assessed: Yes Dynamic Sitting Balance Dynamic Sitting - Balance Support: No upper extremity supported Dynamic Sitting - Level of Assistance: 7: Independent Static Standing Balance Static Standing - Balance Support: No upper extremity supported Static Standing - Level of Assistance: 7: Independent Dynamic Standing Balance  Dynamic Standing - Balance Support: No upper  extremity supported Dynamic Standing - Level of Assistance: 7: Independent Dynamic Standing - Balance Activities: Forward lean/weight shifting Dynamic Standing - Comments: ambulated at varying speeds, 360 turn right and left; picking up item off floor - no loss of balance  End of Session PT - End of Session Equipment Utilized During Treatment: Gait belt Activity Tolerance: Patient tolerated treatment well Patient left: in bed;with call bell/phone within reach;with family/visitor present  GP Functional Assessment Tool Used: clinical judgement Functional Limitation: Mobility: Walking and moving around Mobility: Walking and Moving Around Current Status [id number]): 0 percent impaired, limited or restricted Mobility: Walking and Moving Around Goal Status [id number]): 0 percent impaired, limited or restricted Mobility: Walking and Moving Around Discharge Status [phone]): 0 percent impaired, limited or restricted   Shanna Cisco, [place] 05/16/2013, 3:48 PM

## 2013-05-16 NOTE — Progress Notes (Signed)
VASCULAR LAB PRELIMINARY  PRELIMINARY  PRELIMINARY  PRELIMINARY  Carotid duplex  completed.    Preliminary report:  Bilateral:  1-39% ICA stenosis.  Vertebral artery flow is antegrade.      Bobby Vega, RVT 05/16/2013, 11:08 AM

## 2013-05-16 NOTE — ED Provider Notes (Signed)
CSN: [id number]     Arrival date & time 05/15/13  2226 History   First MD Initiated Contact with Patient 05/15/13 2251     Chief Complaint  Patient presents with  . Code Stroke    HPI As reported that the patient was last seen well at 1900.  He reports right upper extremity weakness.  He denies difficulty with speech.  Initially he stated that he was sitting on the couch and when he got up to use the restroom he felt dizzy and lightheaded and developed nausea and vomiting and then sometime later noted that his right upper extremity did not appear to be working normally.  Later during his visit in the ER family showed up and stated that he was found on the ground and that when they assisted him to get up from the ground is when he noticed his right upper extremity was not working appropriately.  It's unclear as to what time of the day exactly he fell.  He reports it was light outside.  He denies chest pain or shortness of breath.  No abdominal pain.  No recent nausea vomiting or diarrhea.  He denies headache at this time but still feels slightly "dizzy".  He denies neck pain.  No recent trauma per the patient.  Patient does admit to occasional alcohol use.  He does state he was having a drink this evening  Past Medical History  Diagnosis Date  . Hyperlipidemia     takes Zocor nightly  . BPH (benign prostatic hyperplasia)     takes Avodart daily  . Constipation     Metamucil prn  . Borderline diabetes   . Pain in joint, hand   . ED (erectile dysfunction)     takes Cialis as needed as well as Viagra  . GERD (gastroesophageal reflux disease)     takes Nexium daily  . Hypertension     takes Losartan daily  . Peripheral vascular disease   . Embolism - blood clot     in stomach  . Bronchitis     yrs ago  . Arthritis     hip  . Lumbar spondylosis with myelopathy   . Cervical spondylosis without myelopathy   . Cervical spondylosis with myelopathy   . Lumbar spondylosis with myelopathy    . Chronic back pain   . Diverticulosis   . Urinary urgency   . Nocturia    Past Surgical History  Procedure Laterality Date  . Skin graft  1983  . Aorta - bilateral femoral artery bypass graft  07/13/2011    Procedure: AORTA BIFEMORAL BYPASS GRAFT;  Surgeon: Theotis Burrow, MD;  Location: [place];  Service: Vascular;  Laterality: Bilateral;  . Cystoscopy  07/13/2011    Procedure: CYSTOSCOPY;  Surgeon: Hanley Ben, MD;  Location: [place];  Service: Urology;  Laterality: N/A;  cystoscopy,dilatation & insertion of councill foley catheter   Family History  Problem Relation Age of Onset  . Anesthesia problems Neg Hx   . Hypotension Neg Hx   . Malignant hyperthermia Neg Hx   . Pseudochol deficiency Neg Hx    History  Substance Use Topics  . Smoking status: Former Smoker -- .5 years    Types: Cigarettes, Cigars    Quit date: 04/29/1992  . Smokeless tobacco: Never Used  . Alcohol Use: 1.0 oz/week    2 drink(s) per week     Comment: red wine    Review of Systems  All other systems reviewed and  are negative.    Allergies  Review of patient's allergies indicates no known allergies.  Home Medications   Current Outpatient Rx  Name  Route  Sig  Dispense  Refill  . esomeprazole (NEXIUM) 40 MG capsule   Oral   Take 40 mg by mouth daily before breakfast.          . HYDROcodone-acetaminophen (NORCO/VICODIN) 5-325 MG per tablet   Oral   Take 1 tablet by mouth daily as needed (pain).          Marland Kitchen losartan (COZAAR) 100 MG tablet   Oral   Take 50 mg by mouth daily.          . pregabalin (LYRICA) 75 MG capsule   Oral   Take 75 mg by mouth 3 (three) times daily.          . Vitamin D, Ergocalciferol, (DRISDOL) 50000 UNITS CAPS capsule   Oral   Take 50,000 Units by mouth every 7 (seven) days. Take on Friday or Saturday          BP 153/70  Pulse 70  Temp(Src) 98.7 F (37.1 C) (Oral)  Resp 15  SpO2 98% Physical Exam  Nursing note and vitals  reviewed. Constitutional: He is oriented to person, place, and time. He appears well-developed and well-nourished.  HENT:  Head: Normocephalic and atraumatic.  Eyes: EOM are normal. Pupils are equal, round, and reactive to light.  Neck: Normal range of motion.  Cardiovascular: Normal rate, regular rhythm, normal heart sounds and intact distal pulses.   Pulmonary/Chest: Effort normal and breath sounds normal. No respiratory distress.  Abdominal: Soft. He exhibits no distension. There is no tenderness.  Musculoskeletal: Normal range of motion.  Neurological: He is alert and oriented to person, place, and time.  5/5 strength in major muscle groups of  bilateral lower extremities and left upper extremity.  He has rather profound weakness of his right upper extremity with 1 out of 5 strength.   Skin: Skin is warm and dry.  Psychiatric: He has a normal mood and affect. Judgment normal.    ED Course  Procedures (including critical care time)  CRITICAL CARE Performed by: Hoy Morn Total critical care time: 32 Critical care time was exclusive of separately billable procedures and treating other patients. Critical care was necessary to treat or prevent imminent or life-threatening deterioration. Critical care was time spent personally by me on the following activities: development of treatment plan with patient and/or surrogate as well as nursing, discussions with consultants, evaluation of patient's response to treatment, examination of patient, obtaining history from patient or surrogate, ordering and performing treatments and interventions, ordering and review of laboratory studies, ordering and review of radiographic studies, pulse oximetry and re-evaluation of patient's condition.   Labs Review Labs Reviewed  CBC - Abnormal; Notable for the following:    WBC 11.4 (*)    Platelets 125 (*)    All other components within normal limits  DIFFERENTIAL - Abnormal; Notable for the following:     Neutrophils Relative % 80 (*)    Neutro Abs 9.2 (*)    All other components within normal limits  COMPREHENSIVE METABOLIC PANEL - Abnormal; Notable for the following:    Sodium 133 (*)    CO2 14 (*)    Glucose, Bld 154 (*)    BUN 25 (*)    Creatinine, Ser 2.21 (*)    Total Bilirubin 0.2 (*)    GFR calc non Af Amer 27 (*)  GFR calc Af Amer 32 (*)    All other components within normal limits  GLUCOSE, CAPILLARY - Abnormal; Notable for the following:    Glucose-Capillary 135 (*)    All other components within normal limits  POCT I-STAT, CHEM 8 - Abnormal; Notable for the following:    BUN 30 (*)    Creatinine, Ser 2.50 (*)    Glucose, Bld 159 (*)    All other components within normal limits  ETHANOL  PROTIME-INR  APTT  TROPONIN I  URINE RAPID DRUG SCREEN (HOSP PERFORMED)  URINALYSIS, ROUTINE W REFLEX MICROSCOPIC  POCT I-STAT TROPONIN I   Imaging Review Ct Head Wo Contrast  05/15/2013   CLINICAL DATA:  Code stroke.  Flaccid right arm.  EXAM: CT HEAD WITHOUT CONTRAST  TECHNIQUE: Contiguous axial images were obtained from the base of the skull through the vertex without intravenous contrast.  COMPARISON:  None.  FINDINGS: Skull and Sinuses:No significant abnormality.  Orbits: Bilateral cataract resection.  Brain: No evidence of acute abnormality, such as acute gray matter infarction, hemorrhage, hydrocephalus, or mass lesion/mass effect.  Remote appearing small vessel infarct in the peripheral bilateral cerebellum. Indistinct low-attenuation in the lower left cerebellum with suggestion of volume long. Small low-attenuation focus in the ventral right cerebrum is most likely a dilated perivascular space given proximity to the lower sylvian fissure. There is patchy bilateral cerebral white matter low-attenuation, nonspecific. Cerebral volume loss.  These results were called by telephone at the time of interpretation on 05/15/2013 at 10:54 PM to Dr. Venora Maples, who verbally acknowledged these  results.  IMPRESSION: 1. Negative for acute intracranial hemorrhage. No evidence of large vessel territory infarction. 2. Small vessel ischemic white matter disease. No prior to establish stability. 3. Bilateral cerebellar infarcts, remote based on the reported deficit.   Electronically Signed   By: Jorje Guild M.D.   On: 05/15/2013 22:57  I personally reviewed the imaging tests through PACS system I reviewed available ER/hospitalization records through the EMR   EKG Interpretation    Date/Time:  Thursday May 15 2013 22:51:11 EST Ventricular Rate:  81 PR Interval:  186 QRS Duration: 95 QT Interval:  382 QTC Calculation: 443 R Axis:   70 Text Interpretation:  Sinus rhythm Probable left atrial enlargement Borderline T wave abnormalities No significant change was found Confirmed by Marque Rademaker  MD, Carel Carrier ([id number]) on 05/15/2013 11:39:31 PM            MDM   1. Right arm weakness    Patient presented emergency department as a code stroke.  His airway was patent on arrival.  He was taken emergently to the CT scan.  I evaluated him fully on arrival to the ER today after head CT.  Head CT without obvious abnormalities.  Patient with obvious deficit of his right upper extremity.  Seem to be isolated to his right upper extremity.  Was unclear as to the onset of his symptoms and when he was last seen well.  Therefore the decision was made by neurology to withhold TPA.  I think this is reasonable as his exam is difficult and seems to fluctuate.  Given the fact that we do not have the last seen well time and makes it difficult to determine if he truly would benefit from aggressive intervention.  Patient does not appear to be in atrial fibrillation.  EKG normal sinus rhythm.  Patient be admitted to the hospitalist for ongoing workup to include echocardiogram, carotid Dopplers, risk stratification and risk reduction.  Please see neurology consultation note    Hoy Morn, MD 05/16/13 [phone]

## 2013-05-17 ENCOUNTER — Observation Stay (HOSPITAL_COMMUNITY): Payer: Medicare HMO

## 2013-05-17 DIAGNOSIS — I6350 Cerebral infarction due to unspecified occlusion or stenosis of unspecified cerebral artery: Secondary | ICD-10-CM

## 2013-05-17 DIAGNOSIS — I739 Peripheral vascular disease, unspecified: Secondary | ICD-10-CM

## 2013-05-17 DIAGNOSIS — I639 Cerebral infarction, unspecified: Secondary | ICD-10-CM | POA: Diagnosis present

## 2013-05-17 LAB — RAPID URINE DRUG SCREEN, HOSP PERFORMED
Barbiturates: NOT DETECTED
Benzodiazepines: NOT DETECTED
Cocaine: NOT DETECTED
Tetrahydrocannabinol: POSITIVE — AB

## 2013-05-17 LAB — URINALYSIS, ROUTINE W REFLEX MICROSCOPIC
Bilirubin Urine: NEGATIVE
Hgb urine dipstick: NEGATIVE
Nitrite: NEGATIVE
Protein, ur: 30 mg/dL — AB
Specific Gravity, Urine: 1.012 (ref 1.005–1.030)
Urobilinogen, UA: 0.2 mg/dL (ref 0.0–1.0)

## 2013-05-17 LAB — BASIC METABOLIC PANEL
BUN: 25 mg/dL — ABNORMAL HIGH (ref 6–23)
Calcium: 8.8 mg/dL (ref 8.4–10.5)
Chloride: 104 mEq/L (ref 96–112)
Creatinine, Ser: 1.73 mg/dL — ABNORMAL HIGH (ref 0.50–1.35)
GFR calc Af Amer: 42 mL/min — ABNORMAL LOW (ref >90)
Glucose, Bld: 143 mg/dL — ABNORMAL HIGH (ref 70–99)

## 2013-05-17 LAB — GLUCOSE, CAPILLARY
Glucose-Capillary: 178 mg/dL — ABNORMAL HIGH (ref 70–99)
Glucose-Capillary: 87 mg/dL (ref 70–99)
Glucose-Capillary: 122 mg/dL — ABNORMAL HIGH (ref 70–99)
Glucose-Capillary: 130 mg/dL — ABNORMAL HIGH (ref 70–99)

## 2013-05-17 LAB — URINE MICROSCOPIC-ADD ON

## 2013-05-17 MED ORDER — AMLODIPINE BESYLATE 10 MG PO TABS
10.0000 mg | ORAL_TABLET | Freq: Every day | ORAL | Status: DC
  Administered 2013-05-17 – 2013-05-18 (×2): 10 mg via ORAL
  Filled 2013-05-17 (×2): qty 1

## 2013-05-17 MED ORDER — DIPHENHYDRAMINE HCL 25 MG PO CAPS
25.0000 mg | ORAL_CAPSULE | Freq: Three times a day (TID) | ORAL | Status: DC | PRN
  Administered 2013-05-17 – 2013-05-18 (×2): 25 mg via ORAL
  Filled 2013-05-17 (×2): qty 1

## 2013-05-17 MED ORDER — CIPROFLOXACIN HCL 500 MG PO TABS
500.0000 mg | ORAL_TABLET | Freq: Two times a day (BID) | ORAL | Status: DC
  Administered 2013-05-17 – 2013-05-18 (×3): 500 mg via ORAL
  Filled 2013-05-17 (×5): qty 1

## 2013-05-17 NOTE — Progress Notes (Addendum)
NEURO HOSPITALIST PROGRESS NOTE   SUBJECTIVE:                                                                                                                        No new neurological complains, right arm weakness unchanged. On aspirin 325 mg daily. MRI/MRA brain pending.   OBJECTIVE:                                                                                                                           Vital signs in last 24 hours: Temp:  [97.6 F (36.4 C)-98.3 F (36.8 C)] 97.6 F (36.4 C) (11/22 0400) Pulse Rate:  [52-67] 52 (11/22 0400) Resp:  [16-19] 18 (11/22 0400) BP: (145-169)/(62-84) 151/64 mmHg (11/22 0400) SpO2:  [96 %-99 %] 98 % (11/22 0400) Weight:  [77.495 kg (170 lb 13.5 oz)-77.565 kg (171 lb)] 77.495 kg (170 lb 13.5 oz) (11/22 0000)  Intake/Output from previous day: 11/21 0701 - 11/22 0700 In: 360 [P.O.:360] Out: 300 [Urine:300] Intake/Output this shift:   Nutritional status: Cardiac  Past Medical History  Diagnosis Date  . Hyperlipidemia     takes Zocor nightly  . BPH (benign prostatic hyperplasia)     takes Avodart daily  . Constipation     Metamucil prn  . Borderline diabetes   . Pain in joint, hand   . ED (erectile dysfunction)     takes Cialis as needed as well as Viagra  . GERD (gastroesophageal reflux disease)     takes Nexium daily  . Hypertension     takes Losartan daily  . Peripheral vascular disease   . Embolism - blood clot     in stomach  . Bronchitis     yrs ago  . Arthritis     hip  . Lumbar spondylosis with myelopathy   . Cervical spondylosis without myelopathy   . Cervical spondylosis with myelopathy   . Lumbar spondylosis with myelopathy   . Chronic back pain   . Diverticulosis   . Urinary urgency   . Nocturia     Neurologic Exam:  Mental Status:  Alert, awake, oriented x 3. Speech fluent without evidence of aphasia. Able to follow 3 step commands without difficulty.  Cranial  Nerves:  II: Discs flat bilaterally; Visual fields grossly normal, pupils equal, round, reactive to light and accommodation  III,IV, VI: ptosis not present, extra-ocular motions intact bilaterally  V,VII: smile symmetric, facial light touch sensation normal bilaterally  VIII: hearing normal bilaterally  IX,X: gag reflex present  XI: bilateral shoulder shrug  XII: midline tongue extension  Motor:  Right : Upper extremity 2-3/5 (fluctuates) Left: Upper extremity 5/5  Lower extremity 5/5 Lower extremity 5/5  Tone and bulk:normal tone throughout; no atrophy noted  Sensory: Unable to appreciate pinprick and light touch in the RUE. Describes a right sensory neglect.  Deep Tendon Reflexes: 2+ in the upper extremities and absent in the lower extremities.  Plantars:  Right: downgoing Left: downgoing  Cerebellar:  normal finger-to-nose and normal heel-to-shin testing excluding the RUE  Gait: Unable to test   Lab Results: Lab Results  Component Value Date/Time   CHOL 263* 05/16/2013  6:22 AM   Lipid Panel  Recent Labs  05/16/13 0622  CHOL 263*  TRIG 189*  HDL 28*  CHOLHDL 9.4  VLDL 38  LDLCALC 197*    Studies/Results: Ct Head Wo Contrast  05/15/2013   CLINICAL DATA:  Code stroke.  Flaccid right arm.  EXAM: CT HEAD WITHOUT CONTRAST  TECHNIQUE: Contiguous axial images were obtained from the base of the skull through the vertex without intravenous contrast.  COMPARISON:  None.  FINDINGS: Skull and Sinuses:No significant abnormality.  Orbits: Bilateral cataract resection.  Brain: No evidence of acute abnormality, such as acute gray matter infarction, hemorrhage, hydrocephalus, or mass lesion/mass effect.  Remote appearing small vessel infarct in the peripheral bilateral cerebellum. Indistinct low-attenuation in the lower left cerebellum with suggestion of volume long. Small low-attenuation focus in the ventral right cerebrum is most likely a dilated perivascular space given proximity to  the lower sylvian fissure. There is patchy bilateral cerebral white matter low-attenuation, nonspecific. Cerebral volume loss.  These results were called by telephone at the time of interpretation on 05/15/2013 at 10:54 PM to Dr. Venora Maples, who verbally acknowledged these results.  IMPRESSION: 1. Negative for acute intracranial hemorrhage. No evidence of large vessel territory infarction. 2. Small vessel ischemic white matter disease. No prior to establish stability. 3. Bilateral cerebellar infarcts, remote based on the reported deficit.   Electronically Signed   By: Jorje Guild M.D.   On: 05/15/2013 22:57    MEDICATIONS                                                                                                                       I have reviewed the patient's current medications.  ASSESSMENT/PLAN:  78 y.o. male presenting with RUE weakness and numbness as well as dizziness. Suspect left brain stroke. Awaiting MRI/MRA. Continue aspirin. Will follow up.   Dorian Pod, MD [facility] [phone]  05/17/2013, 7:33 AM Addendum: MRI brain confirms acute infarct in the left parietal cortex. Small areas of acute infarct in the left occipital lobe, most likely embolic. MRA brain: moderately severe diffuse intracranial disease with occluded left VA. TTE unimpressive. CUS pending. Cholesterol 263, Triglycerides 189, HDL 28, LDL Cholesterol 197. Will ask stroke team to follow up.  Dorian Pod, MD

## 2013-05-17 NOTE — Progress Notes (Signed)
Patient ID: Bobby Vega  male  [id number]    DOB: 07/18/1934    DOA: 05/15/2013  PCP: Philis Fendt, MD  Assessment/Plan: Principal Problem:   Acute CVA with Right arm weakness - MRI of the brain done today, showed acute infarct in the hyoid left parietal cortex. Small areas of  acute infarct in the left occipital lobe.  - MRA showed moderately severe intracranial atherosclerotic disease, left vertebral artery occluded - 2-D echo showed EF of 55-60%, normal wall motion, no patent foramina ovale - EEG was essentially normal -Carotid Dopplers showed 1-39% ICA stenosis  - Tolerating regular diet, on aspirin 325 mg daily - Lipid panel showed cholesterol 263, triglycerides 189, LDL 197 , On Lipitor  Active Problems:   PVD (peripheral vascular disease) with claudication - Continue aspirin    GERD (gastroesophageal reflux disease) - Continue PPI  UTI:  - Urine culture pending, placed on ciprofloxacin X 7 days    Hypertension: Uncontrolled -  started on Norvasc 10 mg daily. 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 Will arrange home health PT OT    Subjective: Still having right arm weakness.   Objective: Weight change: 1.86 kg (4 lb 1.6 oz)  Intake/Output Summary (Last 24 hours) at 05/17/13 1018 Last data filed at 05/17/13 0815  Gross per 24 hour  Intake    600 ml  Output    300 ml  Net    300 ml   Blood pressure 151/64, pulse 52, temperature 97.6 F (36.4 C), temperature source Oral, resp. rate 18, height 5\' 6"  (1.676 m), weight 77.495 kg (170 lb 13.5 oz), SpO2 98.00%.  Physical Exam: General: Alert and awake, oriented x3, not in any acute distress. CVS: S1-S2 clear, no murmur rubs or gallops Chest: CTAB Abdomen:  soft nontender, nondistended, normal bowel sounds  Extremities: no cyanosis, clubbing or edema noted bilaterally Neuro: Cranial nerves II-XII intact, no dysarthria, right upper extremity  2/5, sensation decreased, right and left lower extremity 5/5  Lab Results: Basic Metabolic Panel:  Recent Labs Lab 05/16/13 0622 05/17/13 0637  NA 138 135  K 4.0 4.1  CL 105 104  CO2 21 20  GLUCOSE 126* 143*  BUN 25* 25*  CREATININE 1.85* 1.73*  CALCIUM 8.7 8.8   Liver Function Tests:  Recent Labs Lab 05/15/13 2240 05/16/13 0622  AST 14 12  ALT 6 6  ALKPHOS 111 109  BILITOT 0.2* 0.2*  PROT 7.6 6.8  ALBUMIN 3.7 3.6   No results found for this basename: LIPASE, AMYLASE,  in the last 168 hours No results found for this basename: AMMONIA,  in the last 168 hours CBC:  Recent Labs Lab 05/15/13 2240 05/15/13 2243 05/16/13 0622  WBC 11.4*  --  8.1  NEUTROABS 9.2*  --  4.8  HGB 14.6 16.3 13.1  HCT 43.1 48.0 39.4  MCV 95.4  --  94.9  PLT 125*  --  119*   Cardiac Enzymes:  Recent Labs Lab 05/15/13 2240  TROPONINI <0.30   BNP: No components found with this basename: POCBNP,  CBG:  Recent Labs Lab 05/15/13 2246 05/16/13 1649 05/16/13 2012 05/17/13 0803  GLUCAP 135* 108* 84 178*     Micro Results: Recent Results (from the past 240 hour(s))  MRSA PCR  SCREENING     Status: None   Collection Time    05/16/13  2:16 AM      Result Value Range Status   MRSA by PCR NEGATIVE  NEGATIVE Final   Comment:            The GeneXpert MRSA Assay (FDA     approved for NASAL specimens     only), is one component of a     comprehensive MRSA colonization     surveillance program. It is not     intended to diagnose MRSA     infection nor to guide or     monitor treatment for     MRSA infections.    Studies/Results: Ct Head Wo Contrast  05/15/2013   CLINICAL DATA:  Code stroke.  Flaccid right arm.  EXAM: CT HEAD WITHOUT CONTRAST  TECHNIQUE: Contiguous axial images were obtained  from the base of the skull through the vertex without intravenous contrast.  COMPARISON:  None.  FINDINGS: Skull and Sinuses:No significant abnormality.  Orbits: Bilateral cataract resection.  Brain: No evidence of acute abnormality, such as acute gray matter infarction, hemorrhage, hydrocephalus, or mass lesion/mass effect.  Remote appearing small vessel infarct in the peripheral bilateral cerebellum. Indistinct low-attenuation in the lower left cerebellum with suggestion of volume long. Small low-attenuation focus in the ventral right cerebrum is most likely a dilated perivascular space given proximity to the lower sylvian fissure. There is patchy bilateral cerebral white matter low-attenuation, nonspecific. Cerebral volume loss.  These results were called by telephone at the time of interpretation on 05/15/2013 at 10:54 PM to Dr. Venora Maples, who verbally acknowledged these results.  IMPRESSION: 1. Negative for acute intracranial hemorrhage. No evidence of large vessel territory infarction. 2. Small vessel ischemic white matter disease. No prior to establish stability. 3. Bilateral cerebellar infarcts, remote based on the reported deficit.   Electronically Signed   By: Jorje Guild M.D.   On: 05/15/2013 22:57    Medications: Scheduled Meds: . amLODipine  10 mg Oral Daily  . aspirin  325 mg Oral Daily  . atorvastatin  20 mg Oral q1800  . ciprofloxacin  500 mg Oral BID  . heparin  5,000 Units Subcutaneous Q8H  . pantoprazole  40 mg Oral Daily  . pregabalin  75 mg Oral TID      LOS: 2 days   Jandiel Magallanes M.D. [facility] 05/17/2013, 10:18 AM Pager: [phone]  If 7PM-7AM, please contact night-coverage [email or web address] Password [email or web address]

## 2013-05-17 NOTE — Progress Notes (Signed)
   CARE MANAGEMENT NOTE 05/17/2013  Patient:  ROMIN, FREESE   Account Number:  [id number]  Date Initiated:  05/16/2013  Documentation initiated by:  GRAVES-BIGELOW,BRENDA  Subjective/Objective Assessment:   Pt admitted for Right-sided weakness. His initial CT scan EKG troponin all are negative. Plan for MRI.     Action/Plan:   CM will continue to monitor for disposition needs.   Anticipated DC Date:  05/17/2013   Anticipated DC Plan:  [place]  CM consult      [facility] Choice  HOME HEALTH   Choice offered to / List presented to:  C-1 Patient        [place] arranged  [place].   Status of service:  Completed, signed off Medicare Important Message given?   (If response is "NO", the following Medicare IM given date fields will be blank) Date Medicare IM given:   Date Additional Medicare IM given:    Discharge Disposition:  [place]  Per UR Regulation:  Reviewed for med. necessity/level of care/duration of stay  If discussed at [place] of Stay Meetings, dates discussed:    Comments:  05/17/13 11:20 CM spoke with pt in room to offer choice. Pt chose [facility] for HHPT/OT.  Address and contact numbers were verified.  Referral for HHPT/OT was faxed to [facility].  No DME needed.  No other CM needs were communicated.  Mariane Masters, BSN, CM [phone].

## 2013-05-17 NOTE — Progress Notes (Signed)
OT Cancellation Note  Patient Details Name: Bobby Vega MRN: [id number] DOB: [birth year]   Cancelled Treatment:    Reason Eval/Treat Not Completed: Patient at procedure or test/ unavailable  Benito Mccreedy OTR/L [id number] 05/17/2013, 8:35 AM

## 2013-05-17 NOTE — Evaluation (Addendum)
Occupational Therapy Evaluation Patient Details Name: Bobby Vega MRN: [id number] DOB: [birth year] Today's Date: 05/17/2013 Time: [id number] OT Time Calculation (min): 31 min  OT Assessment / Plan / Recommendation History of present illness Bobby Vega is a 78 y.o. male with Past medical history of dyslipidemia, GERD, hypertension, PVD, spinal spondylosis.  Patient admitted with weakness right arm.  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 Small areas of acute infarct in the left occipital lobe.     Clinical Impression   Pt presents with below problem list. Pt will benefit from acute OT to increase independence prior to d/c. Recommending Outpatient for follow up OT.     OT Assessment  Patient needs continued OT Services    Follow Up Recommendations  Outpatient OT;Supervision - Intermittent -Recommended no driving and to ask Doctor about this.    Barriers to Discharge      Equipment Recommendations  Tub/shower seat    Recommendations for Other Services    Frequency  Min 2X/week    Precautions / Restrictions Precautions Precaution Comments: extremely weak right arm Restrictions Weight Bearing Restrictions: No   Pertinent Vitals/Pain No pain reported.     ADL  Grooming: Minimal assistance Where Assessed - Grooming: Unsupported sitting Where Assessed - Upper Body Dressing: Unsupported sitting Lower Body Dressing: Moderate assistance Where Assessed - Lower Body Dressing: Unsupported sit to stand Toilet Transfer: Supervision/safety Toilet Transfer Method: Sit to Loss adjuster, chartered: Regular height toilet Tub/Shower Transfer: Minimal assistance Tub/Shower Transfer Method: Therapist, art: Other (comment) (3 in 1) Equipment Used: Gait belt Transfers/Ambulation Related to ADLs: Min A for shower transfer and Supervision for sit <> stand transfers with assistance to position Rt hand for weightbearing. ADL Comments:  Educated to have RUE on pillow when in bed or sitting in chair. Educated and pt performed AAROM exercises in bed for RUE. Educated to sit to bathe and have someone with him when getting in/out of tub. Educated to try to use Rt hand during activities and explained it is beneficial to weightbear through [place]. Educated to avoid the stove so that he does not burn himself as he has decreased sensation in Rt hand. Educated to sit to get clothing over feet/legs. Recommended no driving and to ask the doctor about driving.  Educated on signs/symptoms of stroke and also decreasing sodium intake as it increases chance for stroke. Educated and demonstrated gently ROM of digits to decrease edema.    OT Diagnosis: Hemiplegia dominant side  OT Problem List: Decreased strength;Impaired balance (sitting and/or standing);Decreased knowledge of use of DME or AE;Decreased knowledge of precautions;Impaired UE functional use;Impaired sensation;Increased edema OT Treatment Interventions: Self-care/ADL training;Therapeutic exercise;Neuromuscular education;DME and/or AE instruction;Therapeutic activities;Visual/perceptual remediation/compensation;Patient/family education;Balance training   OT Goals(Current goals can be found in the care plan section) Acute Rehab OT Goals Patient Stated Goal: go home OT Goal Formulation: With patient Time For Goal Achievement: 05/24/13 Potential to Achieve Goals: Good ADL Goals Pt Will Perform Grooming: with set-up;standing Pt Will Perform Upper Body Dressing: with set-up;sitting Pt Will Perform Lower Body Dressing: sit to/from stand;with set-up;with supervision Pt Will Transfer to Toilet: with modified independence;ambulating;regular height toilet Pt Will Perform Toileting - Clothing Manipulation and hygiene: with modified independence;sit to/from stand Additional ADL Goal #1: Pt will be independent with HEP for RUE.   Visit Information  Last OT Received On: 05/17/13 Assistance Needed:  +1 Reason Eval/Treat Not Completed: Patient at procedure or test/ unavailable History of Present Illness: Bobby Vega is a 78  y.o. male with Past medical history of dyslipidemia, GERD, hypertension, PVD, spinal spondylosis.  Patient admitted with weakness right arm.  MRI showed        Prior [place] expects to be discharged to:: Private residence Living Arrangements: Alone Available Help at Discharge: Family Type of Home: House Home Access: Stairs to enter Technical brewer of Steps: 3 Entrance Stairs-Rails: None Home Layout: One level Home Equipment: None Prior Function Level of Independence: Independent Communication Communication: No difficulties Dominant Hand: Right         Vision/Perception Vision - History Baseline Vision: Wears glasses all the time Vision - Assessment Vision Assessment: Vision tested Tracking/Visual Pursuits: Other (comment) (tracking difficult for pt on both left and right) Visual Fields: Other (comment) (inconsistent-pt looking around also)   Cognition  Cognition Arousal/Alertness: Awake/alert Behavior During Therapy: WFL for tasks assessed/performed Overall Cognitive Status: Within Functional Limits for tasks assessed    Extremity/Trunk Assessment Upper Extremity Assessment Upper Extremity Assessment: RUE deficits/detail RUE Deficits / Details: unable to grasp. Pt with less than full AROM shoulder flexion.  RUE Sensation: decreased light touch RUE Coordination: decreased fine motor;decreased gross motor Lower Extremity Assessment Lower Extremity Assessment: Defer to PT evaluation     Mobility Bed Mobility Bed Mobility: Sit to Supine;Supine to Sit Supine to Sit: 5: Supervision Sit to Supine: 4: Min assist Details for Bed Mobility Assistance: Assisted in moving RUE into better position when getting into bed to help prevent injury. Transfers Transfers: Sit to Stand;Stand to Sit Sit to Stand:  5: Supervision;From bed;From toilet Stand to Sit: 5: Supervision;To bed;To toilet Details for Transfer Assistance: Supervision for safety. OT did assist in positioning Rt arm for some transfers for weightbearing.      Exercise     Balance     End of Session OT - End of Session Equipment Utilized During Treatment: Gait belt Activity Tolerance: Patient tolerated treatment well Patient left: in bed;with call bell/phone within reach;with family/visitor present Nurse Communication: Other (comment) (had to go to bathroom)  GO     Bobby Vega OTR/L [id number] 05/17/2013, 2:12 PM

## 2013-05-18 DIAGNOSIS — R29898 Other symptoms and signs involving the musculoskeletal system: Secondary | ICD-10-CM

## 2013-05-18 LAB — BASIC METABOLIC PANEL
Calcium: 9.2 mg/dL (ref 8.4–10.5)
Creatinine, Ser: 1.67 mg/dL — ABNORMAL HIGH (ref 0.50–1.35)
GFR calc Af Amer: 44 mL/min — ABNORMAL LOW (ref >90)
GFR calc non Af Amer: 38 mL/min — ABNORMAL LOW (ref >90)
Glucose, Bld: 105 mg/dL — ABNORMAL HIGH (ref 70–99)
Potassium: 4.5 mEq/L (ref 3.5–5.1)
Sodium: 134 mEq/L — ABNORMAL LOW (ref 135–145)

## 2013-05-18 LAB — URINE CULTURE

## 2013-05-18 LAB — GLUCOSE, CAPILLARY: Glucose-Capillary: 118 mg/dL — ABNORMAL HIGH (ref 70–99)

## 2013-05-18 MED ORDER — ASPIRIN EC 81 MG PO TBEC: 81.0000 mg | DELAYED_RELEASE_TABLET | Freq: Every day | ORAL | Status: DC

## 2013-05-18 MED ORDER — ASPIRIN 81 MG PO TBEC: 81.0000 mg | DELAYED_RELEASE_TABLET | Freq: Every day | ORAL | Status: DC

## 2013-05-18 MED ORDER — ATORVASTATIN CALCIUM 20 MG PO TABS: 20.0000 mg | ORAL_TABLET | Freq: Every day | ORAL | Status: DC

## 2013-05-18 MED ORDER — ASPIRIN 325 MG PO TABS: 325.0000 mg | ORAL_TABLET | Freq: Every day | ORAL | Status: DC

## 2013-05-18 MED ORDER — CIPROFLOXACIN HCL 500 MG PO TABS: 500.0000 mg | ORAL_TABLET | Freq: Two times a day (BID) | ORAL | Status: DC

## 2013-05-18 MED ORDER — HYDRALAZINE HCL 25 MG PO TABS: 25.0000 mg | ORAL_TABLET | Freq: Two times a day (BID) | ORAL | Status: DC

## 2013-05-18 MED ORDER — HYDRALAZINE HCL 25 MG PO TABS
25.0000 mg | ORAL_TABLET | Freq: Two times a day (BID) | ORAL | Status: DC
  Administered 2013-05-18: 25 mg via ORAL
  Filled 2013-05-18 (×2): qty 1

## 2013-05-18 MED ORDER — CLOPIDOGREL BISULFATE 75 MG PO TABS: 75.0000 mg | ORAL_TABLET | Freq: Every day | ORAL | Status: AC

## 2013-05-18 MED ORDER — AMLODIPINE BESYLATE 10 MG PO TABS: 10.0000 mg | ORAL_TABLET | Freq: Every day | ORAL | Status: DC

## 2013-05-18 MED ORDER — CLOPIDOGREL BISULFATE 75 MG PO TABS: 75.0000 mg | ORAL_TABLET | Freq: Every day | ORAL | Status: DC

## 2013-05-18 MED ORDER — ASPIRIN 81 MG PO CHEW: 81.0000 mg | CHEWABLE_TABLET | Freq: Every day | ORAL | Status: DC

## 2013-05-18 NOTE — Progress Notes (Signed)
   CARE MANAGEMENT NOTE 05/18/2013  Patient:  Bobby Vega, Bobby Vega   Account Number:  [id number]  Date Initiated:  05/16/2013  Documentation initiated by:  GRAVES-BIGELOW,BRENDA  Subjective/Objective Assessment:   Pt admitted for Right-sided weakness. His initial CT scan EKG troponin all are negative. Plan for MRI.     Action/Plan:   CM will continue to monitor for disposition needs.   Anticipated DC Date:  05/17/2013   Anticipated DC Plan:  [place]  CM consult      [facility] Choice  HOME HEALTH   Choice offered to / List presented to:  C-1 Patient        [place] arranged  [place].   Status of service:  Completed, signed off Medicare Important Message given?   (If response is "NO", the following Medicare IM given date fields will be blank) Date Medicare IM given:   Date Additional Medicare IM given:    Discharge Disposition:  [place]  Per UR Regulation:  Reviewed for med. necessity/level of care/duration of stay  If discussed at [place] of Stay Meetings, dates discussed:    Comments:  05/18/13 09:45 Cm spoke to pt in room to verify he did not want a shower stool and will purchase on his own.  No other CM needs were communicated.  Mariane Masters, BSN, Jearld Lesch [phone].  05/17/13 11:20 CM spoke with pt in room to offer choice. Pt chose [facility] for HHPT/OT.  Address and contact numbers were verified.  Referral for HHPT/OT was faxed to [facility].  No DME needed.  No other CM needs were communicated.  Mariane Masters, BSN, CM [phone].

## 2013-05-18 NOTE — Progress Notes (Signed)
Stroke Team Progress Note  HISTORY Bobby Vega is a RH 78 y.o. male who is a very difficult historian. It seems that he was sitting down watching television and talking with some friends. When he attempted to get up to use the bathroom he was dizzy and required assistance. At some point in time he fell to the floor and was there for some period of time. It seems that his nephew called him and from there other family members were contacted and EMS was called. Patient was brought in as a code stroke. Patient was unable to give a reliable LKW. He also complained of right arm pain, numbness and weakness but was unclear when this started. It seemed that he fell to the floor when it was light outside but that this is  as close as can be determined as to time. It also seems that the patient had some vomiting due to the fact that dried vomitus was on his shirt but he was unable to give any information concerning that as well.   Date last known well: Date: 05/15/2013  Time last known well: Unable to determine  tPA Given: No: Unable to determine LKW   SUBJECTIVE No complaints. Ready for discharge.   OBJECTIVE Most recent Vital Signs: Filed Vitals:   05/17/13 2000 05/18/13 0000 05/18/13 0037 05/18/13 0400  BP: 163/69 174/82 158/82 166/79  Pulse: 74 84  74  Temp: 97.7 F (36.5 C) 97.8 F (36.6 C)  98.3 F (36.8 C)  TempSrc: Oral Oral  Oral  Resp: 18 18  18   Height:      Weight:  76.93 kg (169 lb 9.6 oz)  76.85 kg (169 lb 6.8 oz)  SpO2: 95% 100%  98%   CBG (last 3)   Recent Labs  05/17/13 1706 05/17/13 2005 05/18/13 0743  GLUCAP 122* 130* 118*    IV Fluid Intake:     MEDICATIONS  . amLODipine  10 mg Oral Daily  . aspirin  325 mg Oral Daily  . atorvastatin  20 mg Oral q1800  . ciprofloxacin  500 mg Oral BID  . heparin  5,000 Units Subcutaneous Q8H  . hydrALAZINE  25 mg Oral BID  . pantoprazole  40 mg Oral Daily  . pregabalin  75 mg Oral TID   PRN:  diphenhydrAMINE,  HYDROcodone-acetaminophen  Diet:  Cardiac thin liquids Activity:  Bathroom privileges with assistance DVT Prophylaxis:  Subcutaneous heparin  CLINICALLY SIGNIFICANT STUDIES Basic Metabolic Panel:  Recent Labs Lab 05/17/13 0637 05/18/13 0525  NA 135 134*  K 4.1 4.5  CL 104 102  CO2 20 19  GLUCOSE 143* 105*  BUN 25* 25*  CREATININE 1.73* 1.67*  CALCIUM 8.8 9.2   Liver Function Tests:  Recent Labs Lab 05/15/13 2240 05/16/13 0622  AST 14 12  ALT 6 6  ALKPHOS 111 109  BILITOT 0.2* 0.2*  PROT 7.6 6.8  ALBUMIN 3.7 3.6   CBC:  Recent Labs Lab 05/15/13 2240 05/15/13 2243 05/16/13 0622  WBC 11.4*  --  8.1  NEUTROABS 9.2*  --  4.8  HGB 14.6 16.3 13.1  HCT 43.1 48.0 39.4  MCV 95.4  --  94.9  PLT 125*  --  119*   Coagulation:  Recent Labs Lab 05/15/13 2240 05/16/13 0622  LABPROT 13.0 13.8  INR 1.00 1.08   Cardiac Enzymes:  Recent Labs Lab 05/15/13 2240  TROPONINI <0.30   Urinalysis:  Recent Labs Lab 05/17/13 0530  COLORURINE YELLOW  LABSPEC 1.012  PHURINE  5.0  GLUCOSEU NEGATIVE  HGBUR NEGATIVE  BILIRUBINUR NEGATIVE  KETONESUR NEGATIVE  PROTEINUR 30*  UROBILINOGEN 0.2  NITRITE NEGATIVE  LEUKOCYTESUR MODERATE*   Lipid Panel    Component Value Date/Time   CHOL 263* 05/16/2013 0622   TRIG 189* 05/16/2013 0622   HDL 28* 05/16/2013 0622   CHOLHDL 9.4 05/16/2013 0622   VLDL 38 05/16/2013 0622   LDLCALC 197* 05/16/2013 0622   HgbA1C  Lab Results  Component Value Date   HGBA1C 6.0* 05/16/2013    Urine Drug Screen:     Component Value Date/Time   LABOPIA NONE DETECTED 05/17/2013 0530   COCAINSCRNUR NONE DETECTED 05/17/2013 0530   LABBENZ NONE DETECTED 05/17/2013 0530   AMPHETMU NONE DETECTED 05/17/2013 0530   THCU POSITIVE* 05/17/2013 0530   LABBARB NONE DETECTED 05/17/2013 0530    Alcohol Level:  Recent Labs Lab 05/15/13 2240  ETH <11    Mri Brain Without Contrast 05/17/2013   Acute infarct left parietal cortex. Smaller area of  acute infarct left occipital lobe.    MRA Head/brain Wo Cm 05/17/2013    Distal left vertebral artery not visualized and appears occluded.  Left posterior inferior cerebellar artery not visualized. Basilar is widely patent.Moderate atherosclerotic disease in the right cavernous carotid artery with mild to moderate stenosis. Moderate stenosis proximal left A1 segment. Atherosclerotic irregularity and moderate stenosis left M1 segment. Atherosclerotic disease in left middle cerebral artery branches with a moderately severe stenosis in the left parietal branch. IMPRESSION: Moderately severe intracranial atherosclerotic disease as above. Left vertebral artery is occluded.     CT of head without contrast 05/15/2013 1. Negative for acute intracranial hemorrhage. No evidence of large vessel territory infarction.  2. Small vessel ischemic white matter disease. No prior to establish stability.  3. Bilateral cerebellar infarcts, remote based on the reported deficit.  2D Echocardiogram  ejection fraction 55-60%. No cardiac source of emboli identified.  Carotid Doppler  Bilateral: 1-39% ICA stenosis. Vertebral artery flow is antegrade.   EEG 05/16/2013 - Impression: this is a normal low voltage awake and asleep EEG.  CXR    EKG  sinus rhythm rate 81 beats per minute  Therapy Recommendations -  Outpatient occupational / physical therapy recommended  Physical Exam    Neurologic Examination:   Mental Status:  Alert but does most of the examination with his eyes closed. Unable to tell me the month and that Thanksgiving is the next holiday. Speech fluent without evidence of aphasia. Able to follow 3 step commands without difficulty.  Cranial Nerves:  II: Discs flat bilaterally; Visual fields grossly normal, pupils equal, round, reactive to light and accommodation  III,IV, VI: ptosis not present, extra-ocular motions intact bilaterally  V,VII: smile symmetric, facial light touch sensation normal  bilaterally  VIII: hearing normal bilaterally  IX,X: gag reflex present  XI: bilateral shoulder shrug  XII: midline tongue extension  Motor:  Right : Upper extremity 2-3/5 (fluctuates) Left: Upper extremity 5/5  Lower extremity 5/5 Lower extremity 5/5  Tone and bulk:normal tone throughout; no atrophy noted  Sensory: Unable to appreciate pinprick and light touch in the RUE. Describes a right sensory neglect.  Deep Tendon Reflexes: 2+ in the upper extremities and absent in the lower extremities.  Plantars:  Right: downgoing Left: downgoing  Cerebellar:  normal finger-to-nose and normal heel-to-shin testing excluding the RUE  Gait: Unable to test  CV: pulses palpable throughout    ASSESSMENT Mr. Gordie Bamber is a 78 y.o. male presenting with right upper  extremity weakness. TPA was not given as the time of onset could not be determined. An MRI revealed an acute infarct left parietal cortex. Smaller area of acute infarct left occipital lobe. Infarct felt to be thromboembolic secondary to an occluded left vertebral artery. On no antithrombotics prior to admission. Now on aspirin 325 mg orally every day for secondary stroke prevention. Patient with resultant right upper extremity paresis. Work up completed.   Hyperlipidemia cholesterol 263 LDL 197 - Lipitor started this admission  Hypertension history  Borderline diabetes - hemoglobin A1c 6.0  Drug screen positive for THC  Renal insufficiency  Left vertebral artery occluded  Hospital day # 3  TREATMENT/PLAN  Continue aspirin 325 mg orally every day for secondary stroke prevention.  Risk factor modification  Outpatient occupational / physical therapy recommended  Continue Lipitor for hyperlipidemia  OK to discharge. F/U Dr Leonie Man 2 mos. Treat with ASA 81 mg daily and Plavix 75 mg daily for 3 months - then Plavix alone.  Mikey Bussing PA-C [facility] Pager [phone] 05/18/2013, 9:13 AM  I have  personally obtained a history, examined the patient, evaluated imaging results, and formulated the assessment and plan of care. I agree with the above. Antony Contras, MD

## 2013-05-18 NOTE — Discharge Summary (Signed)
Physician Discharge Summary  Patient ID: Bobby Vega MRN: [id number] DOB/AGE: 76-Jan-1936 76 y.o.  Admit date: 05/15/2013 Discharge date: 05/18/2013  Primary Care Physician:  Philis Fendt, MD  Discharge Diagnoses:    . CVA (cerebral vascular accident)  . Right arm weakness due to acute CVA . PVD (peripheral vascular disease) with claudication . GERD (gastroesophageal reflux disease) . Hypertension . Other and unspecified hyperlipidemia . CVA (cerebral vascular accident)  Consults: neurology, stroke service   Recommendations for Outpatient Follow-up:  Please follow patient's BP and adjust medications for good BP control He will be on Aspirin 81mg  with plavix 75mg  daily for 3 months, then Plavix 75mg  daily indefinitely.  Allergies:  No Known Allergies   Discharge Medications:   Medication List    STOP taking these medications       losartan 100 MG tablet  Commonly known as:  COZAAR      TAKE these medications       amLODipine 10 MG tablet  Commonly known as:  NORVASC  Take 1 tablet (10 mg total) by mouth daily.     aspirin 81 MG EC tablet  Take 1 tablet (81 mg total) by mouth daily. For 3 months with plavix.     atorvastatin 20 MG tablet  Commonly known as:  LIPITOR  Take 1 tablet (20 mg total) by mouth daily.     ciprofloxacin 500 MG tablet  Commonly known as:  CIPRO  Take 1 tablet (500 mg total) by mouth 2 (two) times daily. X 6days     clopidogrel 75 MG tablet  Commonly known as:  PLAVIX  Take 1 tablet (75 mg total) by mouth daily with breakfast.     esomeprazole 40 MG capsule  Commonly known as:  NEXIUM  Take 40 mg by mouth daily before breakfast.     hydrALAZINE 25 MG tablet  Commonly known as:  APRESOLINE  Take 1 tablet (25 mg total) by mouth 2 (two) times daily.     HYDROcodone-acetaminophen 5-325 MG per tablet  Commonly known as:  NORCO/VICODIN  Take 1 tablet by mouth daily as needed (pain).     pregabalin 75 MG capsule  Commonly  known as:  LYRICA  Take 75 mg by mouth 3 (three) times daily.     Vitamin D (Ergocalciferol) 50000 UNITS Caps capsule  Commonly known as:  DRISDOL  Take 50,000 Units by mouth every 7 (seven) days. Take on Friday or Saturday         Brief H and P: For complete details please refer to admission H and P, but in brief The patient  presented with the complaint of right-sided weakness. He mentioned that on the day of admission, while he was at home in the afternoon he had an episode of nausea and vomiting, at the same time he also had some dizziness and lightheadedness when he was going to the restroom. There was also a history from the family that he was found on the ground and could not lift him up as he was also on home for almost 2 hours and the family arrived they had him in assisted him. It is unclear off the timing of his fall   Hospital Course:  Acute CVA with Right arm weakness  - MRI of the brain done today, showed acute infarct in the hyoid left parietal cortex. Small areas of acute infarct in the left occipital lobe.  MRA showed moderately severe intracranial atherosclerotic disease, left vertebral artery occluded.  2-D echo  showed EF of 55-60%, normal wall motion, no patent foramina ovale. EEG was essentially normal. Carotid Dopplers showed 1-39% ICA stenosis  Patient had no difficulty with swallowing, he tolerated regular diet. Patient was initially placed on aspirin 325 mg daily but stroke service recommended ASA 81mg  with plavix for 3 months then continue Plavix 75mg  daily. - Lipid panel showed cholesterol 263, triglycerides 189, LDL 197 , he was placed on Lipitor   PVD (peripheral vascular disease) with claudication - Continue aspirin with Plavix for 3 months and then Plavix daily  GERD (gastroesophageal reflux disease) - Continue PPI   UTI:  - Patient was placed on ciprofloxacin X 6 days   Hypertension: Uncontrolled - started on Norvasc 10 mg daily and patient was started on  hydralazine. 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 HEENT: anicteric sclera, pupils reactive to light and accommodation CVS: S1-S2 clear no murmur rubs or gallops Chest: clear to auscultation bilaterally, no wheezing rales or rhonchi Abdomen: soft nontender, nondistended, normal bowel sounds, no organomegaly Extremities: no cyanosis, clubbing or edema noted bilaterally Neuro: right arm weakness, left arm and lower extremity 5/ 5strength, right lower extremity  5/5  The results of significant diagnostics from this hospitalization (including imaging, microbiology, ancillary and laboratory) are listed below for reference.    LAB RESULTS: Basic Metabolic Panel:  Recent Labs Lab 05/17/13 0637 05/18/13 0525  NA 135 134*  K 4.1 4.5  CL 104 102  CO2 20 19  GLUCOSE 143* 105*  BUN 25* 25*  CREATININE 1.73* 1.67*  CALCIUM 8.8 9.2   Liver Function Tests:  Recent Labs Lab 05/15/13 2240 05/16/13 0622  AST 14 12  ALT 6 6  ALKPHOS 111 109  BILITOT 0.2* 0.2*  PROT 7.6 6.8  ALBUMIN 3.7 3.6   No results found for this basename: LIPASE, AMYLASE,  in the last 168 hours No results found for this basename: AMMONIA,  in the last 168 hours CBC:  Recent Labs Lab 05/15/13 2240 05/15/13 2243 05/16/13 0622  WBC 11.4*  --  8.1  NEUTROABS 9.2*  --  4.8  HGB 14.6 16.3 13.1  HCT 43.1 48.0 39.4  MCV 95.4  --  94.9  PLT 125*  --  119*   Cardiac Enzymes:  Recent Labs Lab 05/15/13 2240  TROPONINI <0.30   BNP: No components found with this basename: POCBNP,  CBG:  Recent Labs Lab 05/18/13 0743  05/18/13 1209  GLUCAP 118* 159*    Significant Diagnostic Studies:  Ct Head Wo Contrast  05/15/2013   CLINICAL DATA:  Code stroke.  Flaccid right arm.  EXAM: CT HEAD WITHOUT CONTRAST  TECHNIQUE: Contiguous axial images were obtained from the base of the skull through the vertex without intravenous contrast.  COMPARISON:  None.  FINDINGS: Skull and Sinuses:No significant abnormality.  Orbits: Bilateral cataract resection.  Brain: No evidence of acute abnormality, such as acute gray matter infarction, hemorrhage, hydrocephalus, or mass lesion/mass effect.  Remote appearing small vessel infarct in the peripheral bilateral cerebellum. Indistinct low-attenuation in the lower left cerebellum with suggestion  of volume long. Small low-attenuation focus in the ventral right cerebrum is most likely a dilated perivascular space given proximity to the lower sylvian fissure. There is patchy bilateral cerebral white matter low-attenuation, nonspecific. Cerebral volume loss.  These results were called by telephone at the time of interpretation on 05/15/2013 at 10:54 PM to Dr. Venora Maples, who verbally acknowledged these results.  IMPRESSION: 1. Negative for acute intracranial hemorrhage. No evidence of large vessel territory infarction. 2. Small vessel ischemic white matter disease. No prior to establish stability. 3. Bilateral cerebellar infarcts, remote based on the reported deficit.   Electronically Signed   By: Jorje Guild M.D.   On: 05/15/2013 22:57    2D ECHO: Study Conclusions  - Left ventricle: The cavity size was mildly dilated. Wall thickness was increased in a pattern of mild LVH. Systolic function was normal. The estimated ejection fraction was in the range of 55% to 60%. Wall motion was normal; there were no regional wall motion abnormalities. - Atrial septum: No defect or patent foramen ovale was identified.    Disposition and Follow-up: Discharge Orders   Future Appointments Provider  Department Dept Phone   11/10/2013 1:00 PM [facility] [phone]   11/10/2013 1:40 PM Sharmon Leyden Nickel, NP [facility] [phone]   Future Orders Complete By Expires   Diet - low sodium heart healthy  As directed    Discharge instructions  As directed    Comments:     NO driving until cleared by neurology   Discharge instructions  As directed    Comments:     Please continue Aspirin 81mg  with Plavix 75mg  daily for 3 months, then continue plavix 75mg  daily.   Increase activity slowly  As directed        DISPOSITION: home DIET: heart healthy diet  DISCHARGE FOLLOW-UP     Follow-up Information   Follow up with [facility]. (home health physical and occupational therapy)    Contact information:   [address] [place] [address] [phone]       Follow up with Nolene Ebbs A, MD. Schedule an appointment as soon as possible for a visit in 2 weeks.   Specialty:  Internal Medicine   Contact information:   [place] [address] [phone]       Follow up with DICKSON,CHRISTOPHER S, MD In 2 weeks.   Specialty:  Vascular Surgery   Contact information:   [place] [address] [phone]       Follow up with Forbes Cellar, MD In 2 months. (Call this Monday to schedule appointment.)    Specialties:  Neurology, Radiology   Contact information:   [address] [place] [address] [phone]       Time spent on Discharge: 35 mins  Signed:   Price Lachapelle M.D. [facility] 05/18/2013, 12:39 PM Pager: [phone]

## 2013-06-03 ENCOUNTER — Encounter: Payer: Self-pay | Admitting: Vascular Surgery

## 2013-06-04 ENCOUNTER — Ambulatory Visit (INDEPENDENT_AMBULATORY_CARE_PROVIDER_SITE_OTHER): Payer: Medicare HMO | Admitting: Vascular Surgery

## 2013-06-04 ENCOUNTER — Encounter: Payer: Self-pay | Admitting: Vascular Surgery

## 2013-06-04 VITALS — BP 145/67 | HR 77 | Ht 66.0 in | Wt 181.3 lb

## 2013-06-04 DIAGNOSIS — I6350 Cerebral infarction due to unspecified occlusion or stenosis of unspecified cerebral artery: Secondary | ICD-10-CM

## 2013-06-04 DIAGNOSIS — R29898 Other symptoms and signs involving the musculoskeletal system: Secondary | ICD-10-CM

## 2013-06-04 DIAGNOSIS — I639 Cerebral infarction, unspecified: Secondary | ICD-10-CM

## 2013-06-04 NOTE — Progress Notes (Signed)
[facility]  Patient name: Bobby Vega MRN: [id number] DOB: 1934-08-13 Sex: male  REASON FOR CONSULT: left brain stroke.  HPI: Bobby Vega is a 78 y.o. male who has undergone previous aortobifemoral bypass graft by Dr. Trula Slade in January of 2013. Approximately 2 weeks ago, he had a left brain stroke associated with right upper extremity and right lower extremity weakness. The right lower extremity weakness resolved. He has persistent right upper extremity weakness. He underwent an extensive workup at [facility] and was found to have significant intracranial disease on MRA. There was no significant extracranial carotid disease. He was set up for an outpatient visit. Of note aspirin and Plavix were started this admission. Of note, the patient had developed some left upper extremity weakness after his aortofemoral bypass graft. Prior to these events, he denied any history of stroke, TIAs, expressive or receptive aphasia, or amaurosis fugax.  Past Medical History  Diagnosis Date  . Hyperlipidemia     takes Zocor nightly  . BPH (benign prostatic hyperplasia)     takes Avodart daily  . Constipation     Metamucil prn  . Borderline diabetes   . Pain in joint, hand   . ED (erectile dysfunction)     takes Cialis as needed as well as Viagra  . GERD (gastroesophageal reflux disease)     takes Nexium daily  . Hypertension     takes Losartan daily  . Peripheral vascular disease   . Embolism - blood clot     in stomach  . Bronchitis     yrs ago  . Arthritis     hip  . Lumbar spondylosis with myelopathy   . Cervical spondylosis without myelopathy   . Cervical spondylosis with myelopathy   . Lumbar spondylosis with myelopathy   . Chronic back pain   . Diverticulosis   . Urinary urgency   . Nocturia   . Stroke    Family History  Problem Relation Age of Onset  . Anesthesia problems Neg Hx   . Hypotension Neg Hx   . Malignant hyperthermia Neg Hx   .  Pseudochol deficiency Neg Hx    SOCIAL HISTORY: History  Substance Use Topics  . Smoking status: Former Smoker -- .5 years    Types: Cigarettes, Cigars    Quit date: 04/29/1992  . Smokeless tobacco: Never Used  . Alcohol Use: 1.0 oz/week    2 drink(s) per week     Comment: red wine   No Known Allergies Current Outpatient Prescriptions  Medication Sig Dispense Refill  . amLODipine (NORVASC) 10 MG tablet Take 1 tablet (10 mg total) by mouth daily.  30 tablet  4  . aspirin 81 MG EC tablet Take 1 tablet (81 mg total) by mouth daily. For 3 months with plavix.  30 tablet  3  . atorvastatin (LIPITOR) 20 MG tablet Take 1 tablet (20 mg total) by mouth daily.  30 tablet  4  . ciprofloxacin (CIPRO) 500 MG tablet Take 1 tablet (500 mg total) by mouth 2 (two) times daily. X 6days  12 tablet  0  . clopidogrel (PLAVIX) 75 MG tablet Take 1 tablet (75 mg total) by mouth daily with breakfast.  30 tablet  4  . esomeprazole (NEXIUM) 40 MG capsule Take 40 mg by mouth daily before breakfast.       . hydrALAZINE (APRESOLINE) 25 MG tablet Take 1 tablet (25 mg total) by mouth 2 (two) times daily.  60 tablet  3  . HYDROcodone-acetaminophen (NORCO/VICODIN) 5-325 MG per tablet Take 1 tablet by mouth daily as needed (pain).       . pregabalin (LYRICA) 75 MG capsule Take 75 mg by mouth 3 (three) times daily.       . Vitamin D, Ergocalciferol, (DRISDOL) 50000 UNITS CAPS capsule Take 50,000 Units by mouth every 7 (seven) days. Take on Friday or Saturday       No current facility-administered medications for this visit.   REVIEW OF SYSTEMS: Valu.Nieves ] denotes positive finding; [  ] denotes negative finding  CARDIOVASCULAR:  [ ]  chest pain   [ ]  chest pressure   [ ]  palpitations   [ ]  orthopnea   [ ]  dyspnea on exertion   [ ]  claudication   [ ]  rest pain   [ ]  DVT   [ ]  phlebitis PULMONARY:   [ ]  productive cough   [ ]  asthma   [ ]  wheezing NEUROLOGIC:   Valu.Nieves ] weakness Bilateral upper extremities [ ]  paresthesias  [ ]   aphasia  [ ]  amaurosis  [ ]  dizziness HEMATOLOGIC:   [ ]  bleeding problems   [ ]  clotting disorders MUSCULOSKELETAL:  [ ]  joint pain   [ ]  joint swelling [ ]  leg swelling GASTROINTESTINAL: [ ]   blood in stool  [ ]   hematemesis GENITOURINARY:  [ ]   dysuria  [ ]   hematuria PSYCHIATRIC:  [ ]  history of major depression INTEGUMENTARY:  [ ]  rashes  [ ]  ulcers CONSTITUTIONAL:  [ ]  fever   [ ]  chills  PHYSICAL EXAM: Filed Vitals:   06/04/13 0904 06/04/13 0906  BP: 141/69 145/67  Pulse: 77   Height: 5\' 6"  (1.676 m)   Weight: 181 lb 4.8 oz (82.237 kg)   SpO2: 99%    Body mass index is 29.28 kg/(m^2). GENERAL: The patient is a well-nourished male, in no acute distress. The vital signs are documented above. CARDIOVASCULAR: There is a regular rate and rhythm. I do not detect carotid bruits. He has palpable radial pulses bilaterally. He has palpable femoral pulses bilaterally. Both feet are warm and well-perfused. I cannot palpate pedal pulses. He has no significant lower extremity swelling. PULMONARY: There is good air exchange bilaterally without wheezing or rales. ABDOMEN: Soft and non-tender with normal pitched bowel sounds.  MUSCULOSKELETAL: There are no major deformities or cyanosis. NEUROLOGIC: He has one 5 strength to the right biceps, triceps, grip. He has 3-4/5 strength to the left bicep, triceps and grip. He has normal strength in his lower extremities. SKIN: There are no ulcers or rashes noted. PSYCHIATRIC: The patient has a normal affect.  DATA:  Carotid duplex scan on 05/16/2013 showed less than 39% internal carotid artery stenosis bilaterally. Both vertebral arteries had antegrade flow, although the left vertebral artery had an abnormal signal.  MRA on 05/17/2013 showed an acute infarct of the left parietal cortex. There was also a smaller area of acute infarct in the left occipital lobe. There was moderate to severe intracranial atherosclerotic disease in the left cavernous carotid,  left A1 segment. There was also moderate disease of the left M1 segment and disease of the left middle cerebral artery with a moderately severe stenosis in the left parietal branch. The left vertebral artery was occluded.  CT scan showed remote bilateral cerebellar infarcts.  MEDICAL ISSUES:  CVA (cerebral vascular accident) This patient had a left brain stroke documented by MRI. Workup shows significant diffuse intracranial disease but no significant extracranial disease. He  was started on aspirin and Plavix. Fortunately he is not a smoker. He is on a statin for cholesterol. His blood pressure has been well controlled. I have recommended a follow up carotid duplex in 6 months. Currently he has no significant extracranial carotid disease. I do not think that a cerebral arteriogram is indicated at this point given that he has known diffuse intracranial disease and therefore this is likely the cause of his recent stroke. Given that the disease is fairly diffuse again I would not recommend cerebral arteriography and consideration for intracranial angioplasty or stenting at this point. I will have him follow up with Dr. Trula Slade in 6 months with a carotid duplex scan at that time. He will continue his aspirin and Plavix and risk factor management by his primary care physician.   [facility] Beeper: [phone]

## 2013-06-04 NOTE — Assessment & Plan Note (Signed)
This patient had a left brain stroke documented by MRI. Workup shows significant diffuse intracranial disease but no significant extracranial disease. He was started on aspirin and Plavix. Fortunately he is not a smoker. He is on a statin for cholesterol. His blood pressure has been well controlled. I have recommended a follow up carotid duplex in 6 months. Currently he has no significant extracranial carotid disease. I do not think that a cerebral arteriogram is indicated at this point given that he has known diffuse intracranial disease and therefore this is likely the cause of his recent stroke. Given that the disease is fairly diffuse again I would not recommend cerebral arteriography and consideration for intracranial angioplasty or stenting at this point. I will have him follow up with Dr. Trula Slade in 6 months with a carotid duplex scan at that time. He will continue his aspirin and Plavix and risk factor management by his primary care physician.

## 2013-06-04 NOTE — Addendum Note (Signed)
Addended by: Mena Goes on: 06/04/2013 10:12 AM   Modules accepted: Orders

## 2013-06-05 ENCOUNTER — Encounter: Payer: Self-pay | Admitting: *Deleted

## 2013-07-28 ENCOUNTER — Ambulatory Visit: Payer: Self-pay | Admitting: Neurology

## 2013-07-30 ENCOUNTER — Ambulatory Visit: Payer: Self-pay | Admitting: Neurology

## 2013-08-05 ENCOUNTER — Other Ambulatory Visit: Payer: Self-pay | Admitting: Surgery

## 2013-08-05 DIAGNOSIS — Z48812 Encounter for surgical aftercare following surgery on the circulatory system: Secondary | ICD-10-CM

## 2013-08-05 DIAGNOSIS — I70219 Atherosclerosis of native arteries of extremities with intermittent claudication, unspecified extremity: Secondary | ICD-10-CM

## 2013-11-10 ENCOUNTER — Ambulatory Visit: Payer: Medicare HMO | Admitting: Family

## 2013-11-10 ENCOUNTER — Encounter (HOSPITAL_COMMUNITY): Payer: Medicare HMO

## 2013-11-10 ENCOUNTER — Ambulatory Visit: Payer: Self-pay | Admitting: Nurse Practitioner

## 2013-11-11 ENCOUNTER — Telehealth: Payer: Self-pay | Admitting: Nurse Practitioner

## 2013-11-11 NOTE — Telephone Encounter (Signed)
Patient was no show for today's office appointment.  

## 2013-11-28 ENCOUNTER — Encounter: Payer: Self-pay | Admitting: Vascular Surgery

## 2013-12-01 ENCOUNTER — Ambulatory Visit (HOSPITAL_COMMUNITY)
Admission: RE | Admit: 2013-12-01 | Discharge: 2013-12-01 | Disposition: A | Discharge status: Home / Self Care | Payer: Medicare HMO | Source: Ambulatory Visit | Attending: Family | Admitting: Family

## 2013-12-01 ENCOUNTER — Ambulatory Visit (INDEPENDENT_AMBULATORY_CARE_PROVIDER_SITE_OTHER): Payer: Medicare HMO | Admitting: Family

## 2013-12-01 ENCOUNTER — Ambulatory Visit (INDEPENDENT_AMBULATORY_CARE_PROVIDER_SITE_OTHER)
Admission: RE | Admit: 2013-12-01 | Discharge: 2013-12-01 | Disposition: A | Discharge status: Home / Self Care | Payer: Medicare HMO | Source: Ambulatory Visit | Attending: Surgery | Admitting: Surgery

## 2013-12-01 ENCOUNTER — Encounter: Payer: Self-pay | Admitting: Family

## 2013-12-01 VITALS — BP 128/81 | HR 69 | Resp 16 | Ht 66.0 in | Wt 167.0 lb

## 2013-12-01 DIAGNOSIS — I639 Cerebral infarction, unspecified: Secondary | ICD-10-CM

## 2013-12-01 DIAGNOSIS — I70219 Atherosclerosis of native arteries of extremities with intermittent claudication, unspecified extremity: Secondary | ICD-10-CM

## 2013-12-01 DIAGNOSIS — Z48812 Encounter for surgical aftercare following surgery on the circulatory system: Secondary | ICD-10-CM

## 2013-12-01 DIAGNOSIS — I6350 Cerebral infarction due to unspecified occlusion or stenosis of unspecified cerebral artery: Secondary | ICD-10-CM

## 2013-12-01 DIAGNOSIS — R29898 Other symptoms and signs involving the musculoskeletal system: Secondary | ICD-10-CM

## 2013-12-01 NOTE — Patient Instructions (Addendum)
Stroke Prevention Some medical conditions and behaviors are associated with an increased chance of having a stroke. You may prevent a stroke by making healthy choices and managing medical conditions. HOW CAN I REDUCE MY RISK OF HAVING A STROKE?   Stay physically active. Get at least 30 minutes of activity on most or all days.  Do not smoke. It may also be helpful to avoid exposure to secondhand smoke.  Limit alcohol use. Moderate alcohol use is considered to be:  No more than 2 drinks per day for men.  No more than 1 drink per day for nonpregnant women.  Eat healthy foods. This involves  Eating 5 or more servings of fruits and vegetables a day.  Following a diet that addresses high blood pressure (hypertension), high cholesterol, diabetes, or obesity.  Manage your cholesterol levels.  A diet low in saturated fat, trans fat, and cholesterol and high in fiber may control cholesterol levels.  Take any prescribed medicines to control cholesterol as directed by your health care provider.  Manage your diabetes.  A controlled-carbohydrate, controlled-sugar diet is recommended to manage diabetes.  Take any prescribed medicines to control diabetes as directed by your health care provider.  Control your hypertension.  A low-salt (sodium), low-saturated fat, low-trans fat, and low-cholesterol diet is recommended to manage hypertension.  Take any prescribed medicines to control hypertension as directed by your health care provider.  Maintain a healthy weight.  A reduced-calorie, low-sodium, low-saturated fat, low-trans fat, low-cholesterol diet is recommended to manage weight.  Stop drug abuse.  Avoid taking birth control pills.  Talk to your health care provider about the risks of taking birth control pills if you are over 35 years old, smoke, get migraines, or have ever had a blood clot.  Get evaluated for sleep disorders (sleep apnea).  Talk to your health care provider about  getting a sleep evaluation if you snore a lot or have excessive sleepiness.  Take medicines as directed by your health care provider.  For some people, aspirin or blood thinners (anticoagulants) are helpful in reducing the risk of forming abnormal blood clots that can lead to stroke. If you have the irregular heart rhythm of atrial fibrillation, you should be on a blood thinner unless there is a good reason you cannot take them.  Understand all your medicine instructions.  Make sure that other other conditions (such as anemia or atherosclerosis) are addressed. SEEK IMMEDIATE MEDICAL CARE IF:   You have sudden weakness or numbness of the face, arm, or leg, especially on one side of the body.  Your face or eyelid droops to one side.  You have sudden confusion.  You have trouble speaking (aphasia) or understanding.  You have sudden trouble seeing in one or both eyes.  You have sudden trouble walking.  You have dizziness.  You have a loss of balance or coordination.  You have a sudden, severe headache with no known cause.  You have new chest pain or an irregular heartbeat. Any of these symptoms may represent a serious problem that is an emergency. Do not wait to see if the symptoms will go away. Get medical help at once. Call your local emergency services  (911 in [place] Do not drive yourself to the hospital. Document Released: 07/20/2004 Document Revised: 04/02/2013 Document Reviewed: 12/13/2012 [employer] Patient Information 2014 [employer]   Peripheral Vascular Disease Peripheral Vascular Disease (PVD), also called Peripheral Arterial Disease (PAD), is a circulation problem caused by cholesterol (atherosclerotic plaque) deposits in the   arteries. PVD commonly occurs in the lower extremities (legs) but it can occur in other areas of the body, such as your arms. The cholesterol buildup in the arteries reduces blood flow which can cause pain and other serious problems. The presence  of PVD can place a person at risk for Coronary Artery Disease (CAD).  CAUSES  Causes of PVD can be many. It is usually associated with more than one risk factor such as:   High Cholesterol.  Smoking.  Diabetes.  Lack of exercise or inactivity.  High blood pressure (hypertension).  Obesity.  Family history. SYMPTOMS   When the lower extremities are affected, patients with PVD may experience:  Leg pain with exertion or physical activity. This is called INTERMITTENT CLAUDICATION. This may present as cramping or numbness with physical activity. The location of the pain is associated with the level of blockage. For example, blockage at the abdominal level (distal abdominal aorta) may result in buttock or hip pain. Lower leg arterial blockage may result in calf pain.  As PVD becomes more severe, pain can develop with less physical activity.  In people with severe PVD, leg pain may occur at rest.  Other PVD signs and symptoms:  Leg numbness or weakness.  Coldness in the affected leg or foot, especially when compared to the other leg.  A change in leg color.  Patients with significant PVD are more prone to ulcers or sores on toes, feet or legs. These may take longer to heal or may reoccur. The ulcers or sores can become infected.  If signs and symptoms of PVD are ignored, gangrene may occur. This can result in the loss of toes or loss of an entire limb.  Not all leg pain is related to PVD. Other medical conditions can cause leg pain such as:  Blood clots (embolism) or Deep Vein Thrombosis.  Inflammation of the blood vessels (vasculitis).  Spinal stenosis. DIAGNOSIS  Diagnosis of PVD can involve several different types of tests. These can include:  Pulse Volume Recording Method (PVR). This test is simple, painless and does not involve the use of X-rays. PVR involves measuring and comparing the blood pressure in the arms and legs. An ABI (Ankle-Brachial Index) is calculated.  The normal ratio of blood pressures is 1. As this number becomes smaller, it indicates more severe disease.  < 0.95  indicates significant narrowing in one or more leg vessels.  <0.8 there will usually be pain in the foot, leg or buttock with exercise.  <0.4 will usually have pain in the legs at rest.  <0.25  usually indicates limb threatening PVD.  Doppler detection of pulses in the legs. This test is painless and checks to see if you have a pulses in your legs/feet.  A dye or contrast material (a substance that highlights the blood vessels so they show up on x-ray) may be given to help your caregiver better see the arteries for the following tests. The dye is eliminated from your body by the kidney's. Your caregiver may order blood work to check your kidney function and other laboratory values before the following tests are performed:  Magnetic Resonance Angiography (MRA). An MRA is a picture study of the blood vessels and arteries. The MRA machine uses a large magnet to produce images of the blood vessels.  Computed Tomography Angiography (CTA). A CTA is a specialized x-ray that looks at how the blood flows in your blood vessels. An IV may be inserted into your arm so contrast dye   can be injected.  Angiogram. Is a procedure that uses x-rays to look at your blood vessels. This procedure is minimally invasive, meaning a small incision (cut) is made in your groin. A small tube (catheter) is then inserted into the artery of your groin. The catheter is guided to the blood vessel or artery your caregiver wants to examine. Contrast dye is injected into the catheter. X-rays are then taken of the blood vessel or artery. After the images are obtained, the catheter is taken out. TREATMENT  Treatment of PVD involves many interventions which may include:  Lifestyle changes:  Quitting smoking.  Exercise.  Following a low fat, low cholesterol diet.  Control of diabetes.  Foot care is very  important to the PVD patient. Good foot care can help prevent infection.  Medication:  Cholesterol-lowering medicine.  Blood pressure medicine.  Anti-platelet drugs.  Certain medicines may reduce symptoms of Intermittent Claudication.  Interventional/Surgical options:  Angioplasty. An Angioplasty is a procedure that inflates a balloon in the blocked artery. This opens the blocked artery to improve blood flow.  Stent Implant. A wire mesh tube (stent) is placed in the artery. The stent expands and stays in place, allowing the artery to remain open.  Peripheral Bypass Surgery. This is a surgical procedure that reroutes the blood around a blocked artery to help improve blood flow. This type of procedure may be performed if Angioplasty or stent implants are not an option. SEEK IMMEDIATE MEDICAL CARE IF:   You develop pain or numbness in your arms or legs.  Your arm or leg turns cold, becomes blue in color.  You develop redness, warmth, swelling and pain in your arms or legs. MAKE SURE YOU:   Understand these instructions.  Will watch your condition.  Will get help right away if you are not doing well or get worse. Document Released: 07/20/2004 Document Revised: 09/04/2011 Document Reviewed: 06/16/2008 [employer] Patient Information 2014 [employer].   Smoking Cessation Quitting smoking is important to your health and has many advantages. However, it is not always easy to quit since nicotine is a very addictive drug. Often times, people try 3 times or more before being able to quit. This document explains the best ways for you to prepare to quit smoking. Quitting takes hard work and a lot of effort, but you can do it. ADVANTAGES OF QUITTING SMOKING  You will live longer, feel better, and live better.  Your body will feel the impact of quitting smoking almost immediately.  Within 20 minutes, blood pressure decreases. Your pulse returns to its normal level.  After 8 hours,  carbon monoxide levels in the blood return to normal. Your oxygen level increases.  After 24 hours, the chance of having a heart attack starts to decrease. Your breath, hair, and body stop smelling like smoke.  After 48 hours, damaged nerve endings begin to recover. Your sense of taste and smell improve.  After 72 hours, the body is virtually free of nicotine. Your bronchial tubes relax and breathing becomes easier.  After 2 to 12 weeks, lungs can hold more air. Exercise becomes easier and circulation improves.  The risk of having a heart attack, stroke, cancer, or lung disease is greatly reduced.  After 1 year, the risk of coronary heart disease is cut in half.  After 5 years, the risk of stroke falls to the same as a nonsmoker.  After 10 years, the risk of lung cancer is cut in half and the risk of other cancers   decreases significantly.  After 15 years, the risk of coronary heart disease drops, usually to the level of a nonsmoker.  If you are pregnant, quitting smoking will improve your chances of having a healthy baby.  The people you live with, especially any children, will be healthier.  You will have extra money to spend on things other than cigarettes. QUESTIONS TO THINK ABOUT BEFORE ATTEMPTING TO QUIT You may want to talk about your answers with your caregiver.  Why do you want to quit?  If you tried to quit in the past, what helped and what did not?  What will be the most difficult situations for you after you quit? How will you plan to handle them?  Who can help you through the tough times? Your family? Friends? A caregiver?  What pleasures do you get from smoking? What ways can you still get pleasure if you quit? Here are some questions to ask your caregiver:  How can you help me to be successful at quitting?  What medicine do you think would be best for me and how should I take it?  What should I do if I need more help?  What is smoking withdrawal like? How  can I get information on withdrawal? GET READY  Set a quit date.  Change your environment by getting rid of all cigarettes, ashtrays, matches, and lighters in your home, car, or work. Do not let people smoke in your home.  Review your past attempts to quit. Think about what worked and what did not. GET SUPPORT AND ENCOURAGEMENT You have a better chance of being successful if you have help. You can get support in many ways.  Tell your family, friends, and co-workers that you are going to quit and need their support. Ask them not to smoke around you.  Get individual, group, or telephone counseling and support. Programs are available at local hospitals and health centers. Call your local health department for information about programs in your area.  Spiritual beliefs and practices may help some smokers quit.  Download a "quit meter" on your computer to keep track of quit statistics, such as how long you have gone without smoking, cigarettes not smoked, and money saved.  Get a self-help book about quitting smoking and staying off of tobacco. LEARN NEW SKILLS AND BEHAVIORS  Distract yourself from urges to smoke. Talk to someone, go for a walk, or occupy your time with a task.  Change your normal routine. Take a different route to work. Drink tea instead of coffee. Eat breakfast in a different place.  Reduce your stress. Take a hot bath, exercise, or read a book.  Plan something enjoyable to do every day. Reward yourself for not smoking.  Explore interactive web-based programs that specialize in helping you quit. GET MEDICINE AND USE IT CORRECTLY Medicines can help you stop smoking and decrease the urge to smoke. Combining medicine with the above behavioral methods and support can greatly increase your chances of successfully quitting smoking.  Nicotine replacement therapy helps deliver nicotine to your body without the negative effects and risks of smoking. Nicotine replacement therapy  includes nicotine gum, lozenges, inhalers, nasal sprays, and skin patches. Some may be available over-the-counter and others require a prescription.  Antidepressant medicine helps people abstain from smoking, but how this works is unknown. This medicine is available by prescription.  Nicotinic receptor partial agonist medicine simulates the effect of nicotine in your brain. This medicine is available by prescription. Ask your caregiver for   advice about which medicines to use and how to use them based on your health history. Your caregiver will tell you what side effects to look out for if you choose to be on a medicine or therapy. Carefully read the information on the package. Do not use any other product containing nicotine while using a nicotine replacement product.  RELAPSE OR DIFFICULT SITUATIONS Most relapses occur within the first 3 months after quitting. Do not be discouraged if you start smoking again. Remember, most people try several times before finally quitting. You may have symptoms of withdrawal because your body is used to nicotine. You may crave cigarettes, be irritable, feel very hungry, cough often, get headaches, or have difficulty concentrating. The withdrawal symptoms are only temporary. They are strongest when you first quit, but they will go away within 10 14 days. To reduce the chances of relapse, try to:  Avoid drinking alcohol. Drinking lowers your chances of successfully quitting.  Reduce the amount of caffeine you consume. Once you quit smoking, the amount of caffeine in your body increases and can give you symptoms, such as a rapid heartbeat, sweating, and anxiety.  Avoid smokers because they can make you want to smoke.  Do not let weight gain distract you. Many smokers will gain weight when they quit, usually less than 10 pounds. Eat a healthy diet and stay active. You can always lose the weight gained after you quit.  Find ways to improve your mood other than  smoking. FOR MORE INFORMATION  www.smokefree.gov  Document Released: 06/06/2001 Document Revised: 12/12/2011 Document Reviewed: 09/21/2011 [employer] Patient Information 2014 [employer].  

## 2013-12-01 NOTE — Progress Notes (Signed)
[facility] [place] HISTORY AND PHYSICAL   MRN : [id number]  History of Present Illness:   Bobby Vega is a 79 y.o. male patient of Dr. Trula Slade who is s/p aortobifemoral bypass graft by Dr. Trula Slade in January of 2013. In November, 2014, he had a left brain stroke associated with right upper extremity and right lower extremity weakness. The right lower extremity weakness resolved. He has persistent right upper extremity weakness and pain. [facility] and was found to have significant intracranial disease on MRA. There was no significant extracranial carotid disease. He was set up for an outpatient visit. Of note aspirin and Plavix were started this admission. Of note, the patient had developed some left upper extremity weakness after his aortofemoral bypass graft. Dr. Nicole Cella office note from December, 2014 indicates he had no significant extracranial carotid disease. Dr. Scot Dock did not think that a cerebral arteriogram was indicated at that point given that he has known diffuse intracranial disease and therefore this is likely the cause of his recent stroke. Given that the disease was fairly diffuse again Dr. Scot Dock did not recommend cerebral arteriography and consideration for intracranial angioplasty or stenting at this point. Dr. Scot Dock advised that pt continue his aspirin and Plavix and risk factor management by his primary care physician. Pt walks about 100 feet when he develops claudication pain and weakness in both calves and feet, relieved by rest, pt denies non healing wounds. Pt and daughter deny any further stroke or TIA symptoms since November, 2014.  Pt Diabetic: No Pt smoker: smoker  (1-2 cigars/day, stopped cigarettes many years ago) The ASA was stopped and he takes daily Plavix. He also takes a daily statin.  Current Outpatient Prescriptions  Medication Sig Dispense Refill  . amLODipine (NORVASC) 10 MG tablet Take 1 tablet  (10 mg total) by mouth daily.  30 tablet  4  . atorvastatin (LIPITOR) 20 MG tablet Take 1 tablet (20 mg total) by mouth daily.  30 tablet  4  . esomeprazole (NEXIUM) 40 MG capsule Take 40 mg by mouth daily before breakfast.       . furosemide (LASIX) 20 MG tablet daily.      . hydrALAZINE (APRESOLINE) 25 MG tablet Take 1 tablet (25 mg total) by mouth 2 (two) times daily.  60 tablet  3  . pregabalin (LYRICA) 75 MG capsule Take 75 mg by mouth 3 (three) times daily.       Marland Kitchen aspirin 81 MG EC tablet Take 1 tablet (81 mg total) by mouth daily. For 3 months with plavix.  30 tablet  3  . ciprofloxacin (CIPRO) 500 MG tablet Take 1 tablet (500 mg total) by mouth 2 (two) times daily. X 6days  12 tablet  0  . clopidogrel (PLAVIX) 75 MG tablet Take 1 tablet (75 mg total) by mouth daily with breakfast.  30 tablet  4  . HYDROcodone-acetaminophen (NORCO/VICODIN) 5-325 MG per tablet Take 1 tablet by mouth daily as needed (pain).       . Vitamin D, Ergocalciferol, (DRISDOL) 50000 UNITS CAPS capsule Take 50,000 Units by mouth every 7 (seven) days. Take on Friday or Saturday       No current facility-administered medications for this visit.     Past Medical History  Diagnosis Date  . Hyperlipidemia     takes Zocor nightly  . BPH (benign prostatic hyperplasia)     takes Avodart daily  . Constipation     Metamucil  prn  . Borderline diabetes   . Pain in joint, hand   . ED (erectile dysfunction)     takes Cialis as needed as well as Viagra  . GERD (gastroesophageal reflux disease)     takes Nexium daily  . Hypertension     takes Losartan daily  . Peripheral vascular disease   . Embolism - blood clot     in stomach  . Bronchitis     yrs ago  . Arthritis     hip  . Lumbar spondylosis with myelopathy   . Cervical spondylosis without myelopathy   . Cervical spondylosis with myelopathy   . Lumbar spondylosis with myelopathy   . Chronic back pain   . Diverticulosis   . Urinary urgency   . Nocturia    . Stroke     Past Surgical History  Procedure Laterality Date  . Skin graft  1983  . Aorta - bilateral femoral artery bypass graft  07/13/2011    Procedure: AORTA BIFEMORAL BYPASS GRAFT;  Surgeon: Theotis Burrow, MD;  Location: [facility];  Service: Vascular;  Laterality: Bilateral;  . Cystoscopy  07/13/2011    Procedure: CYSTOSCOPY;  Surgeon: Hanley Ben, MD;  Location: [facility];  Service: Urology;  Laterality: N/A;  cystoscopy,dilatation & insertion of councill foley catheter    Social History History  Substance Use Topics  . Smoking status: Former Smoker -- .5 years    Types: Cigarettes, Cigars    Quit date: 04/29/1992  . Smokeless tobacco: Never Used  . Alcohol Use: 1.0 oz/week    2 drink(s) per week     Comment: red wine    Family History Family History  Problem Relation Age of Onset  . Anesthesia problems Neg Hx   . Hypotension Neg Hx   . Malignant hyperthermia Neg Hx   . Pseudochol deficiency Neg Hx   . Heart disease Mother     Before age 12  . Heart disease Brother     NOT  before age 59    No Known Allergies   REVIEW OF SYSTEMS: See HPI for pertinent positives and negatives.  Physical Examination Filed Vitals:   12/01/13 1141 12/01/13 1144  BP: 135/84 128/81  Pulse: 70 69  Resp:  16  Height:  5\' 6"  (1.676 m)  Weight:  167 lb (75.751 kg)  SpO2:  99%   Body mass index is 26.97 kg/(m^2).  General:  WDWN in NAD Gait: Normal HENT: WNL Eyes: Pupils equal Pulmonary: normal non-labored breathing , without Rales, rhonchi,  wheezing Cardiac: RRR, no murmurs detected  Abdomen: soft, NT, no masses Skin: no rashes, ulcers noted;  no Gangrene , no cellulitis; no open wounds;   VASCULAR EXAM  Carotid Bruits Left Right   Negative Negative                             VASCULAR EXAM: Extremities without ischemic changes  without Gangrene; without open wounds.  LE Pulses LEFT RIGHT       FEMORAL  2+ palpable  2+ palpable        POPLITEAL  not palpable   not palpable       POSTERIOR TIBIAL  not palpable   not palpable        DORSALIS PEDIS      ANTERIOR TIBIAL not palpable  not palpable      Musculoskeletal: no muscle wasting or atrophy; no edema  Neurologic: A&O X 3; Appropriate Affect ;  SENSATION: normal; MOTOR FUNCTION: 5/5 Symmetric, CN 2-12 intact Speech is fluent/normal    Non-Invasive Vascular Imaging (12/01/2013):  CEREBROVASCULAR DUPLEX EVALUATION    INDICATION: Carotid disease    PREVIOUS INTERVENTION(S):     DUPLEX EXAM:     RIGHT  LEFT  Peak Systolic Velocities (cm/s) End Diastolic Velocities (cm/s) Plaque LOCATION Peak Systolic Velocities (cm/s) End Diastolic Velocities (cm/s) Plaque  80 16  CCA PROXIMAL 169 35 HM  116 25 HM CCA MID 134 28 HM  88 18 HT CCA DISTAL 109 20   53 7  ECA 34 5   46 19  ICA PROXIMAL 72 14   71 28  ICA MID 61 26   49 21  ICA DISTAL 52 22     0.52 ICA / CCA Ratio (PSV) 0.66  Antegrade Vertebral Flow Antegrade  [id number] Brachial Systolic Pressure (mmHg) [id number]  Multiphasic (subclavian artery) Brachial Artery Waveforms Multiphasic (subclavian artery)    Plaque Morphology:  HM = Homogeneous, HT = Heterogeneous, CP = Calcific Plaque, SP = Smooth Plaque, IP = Irregular Plaque     ADDITIONAL FINDINGS: No significant stenosis of the bilateral external or common carotid arteries.    IMPRESSION: Doppler velocities suggest no stenoses of the bilateral internal carotid arteries.    Compared to the previous exam:  No significant change noted when compared to the previous exam on 07/10/11.      ABI'S RIGHT: 0.48, LEFT: 0.66, all monophasic waveforms. Previous (11/04/12) ABI's: Right: 0.50, Left: 0.58  ASSESSMENT:  Bobby Vega is a 79 y.o. male who is s/p aortobifemoral bypass graft by Dr. Trula Slade in January of 2013. In November, 2014, he had a left brain stroke associated with right  upper extremity and right lower extremity weakness. The right lower extremity weakness resolved. He has persistent right upper extremity weakness. He underwent an extensive workup [facility] and was found to have significant intracranial disease on MRA. There was no significant extracranial carotid disease. He was set up for an outpatient visit. Of note aspirin and Plavix were started this admission. Of note, the patient had developed some left upper extremity weakness after his aortofemoral bypass graft. He has had no further stroke nor TIA symptoms since November, 2014. Doppler velocities suggest no stenoses of the bilateral internal carotid arteries. No significant change noted when compared to the previous exam on 07/10/11.  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 Consider c-spine issues as differentials for source of right arm pain and weakness. Noted that he was a no show for 11/11/13 neurology appointment.   PLAN:   Patient was counseled re smoking cessation. Based on today's exam and non-invasive vascular lab results, the patient will follow up in 6 months with the following tests ABI's, 1 year for carotid Duplex. I discussed in depth with the patient the nature of atherosclerosis,  and emphasized the importance of maximal medical management including strict control of blood pressure, blood glucose, and lipid levels, obtaining regular exercise, and cessation of smoking.  The patient is aware that without maximal medical management the underlying atherosclerotic disease process will progress, limiting the benefit of any interventions. The patient was given information about stroke prevention and what symptoms should prompt the patient to seek immediate medical care.  The patient was given information about PAD including signs, symptoms, treatment, what symptoms should prompt  the patient to seek immediate medical care, and risk reduction measures to take. Thank you for allowing [place] to participate in this patient's care.  Clemon Chambers, RN, MSN, FNP-C [facility] Office: [phone]  Clinic MD: Trula Slade 12/01/2013 11:57 AM

## 2014-06-04 ENCOUNTER — Encounter (HOSPITAL_COMMUNITY): Payer: Self-pay | Admitting: Surgery

## 2014-06-08 ENCOUNTER — Other Ambulatory Visit (HOSPITAL_COMMUNITY): Payer: Medicare HMO

## 2014-06-08 ENCOUNTER — Ambulatory Visit: Payer: Medicare HMO | Admitting: Family

## 2014-06-08 ENCOUNTER — Encounter (HOSPITAL_COMMUNITY): Payer: Medicare HMO

## 2014-06-09 ENCOUNTER — Encounter: Payer: Self-pay | Admitting: Family

## 2014-06-10 ENCOUNTER — Ambulatory Visit (HOSPITAL_COMMUNITY)
Admission: RE | Admit: 2014-06-10 | Discharge: 2014-06-10 | Disposition: A | Discharge status: Home / Self Care | Payer: Medicare HMO | Source: Ambulatory Visit | Attending: Family | Admitting: Family

## 2014-06-10 ENCOUNTER — Encounter: Payer: Self-pay | Admitting: Family

## 2014-06-10 ENCOUNTER — Ambulatory Visit (INDEPENDENT_AMBULATORY_CARE_PROVIDER_SITE_OTHER): Payer: Medicare HMO | Admitting: Family

## 2014-06-10 ENCOUNTER — Ambulatory Visit (INDEPENDENT_AMBULATORY_CARE_PROVIDER_SITE_OTHER)
Admission: RE | Admit: 2014-06-10 | Discharge: 2014-06-10 | Disposition: A | Discharge status: Home / Self Care | Payer: Medicare HMO | Source: Ambulatory Visit | Attending: Family | Admitting: Family

## 2014-06-10 VITALS — BP 157/82 | HR 60 | Resp 16 | Ht 66.0 in | Wt 165.0 lb

## 2014-06-10 DIAGNOSIS — M79609 Pain in unspecified limb: Secondary | ICD-10-CM | POA: Insufficient documentation

## 2014-06-10 DIAGNOSIS — Z95828 Presence of other vascular implants and grafts: Secondary | ICD-10-CM

## 2014-06-10 DIAGNOSIS — I639 Cerebral infarction, unspecified: Secondary | ICD-10-CM | POA: Insufficient documentation

## 2014-06-10 DIAGNOSIS — R200 Anesthesia of skin: Secondary | ICD-10-CM | POA: Insufficient documentation

## 2014-06-10 DIAGNOSIS — Z9889 Other specified postprocedural states: Secondary | ICD-10-CM

## 2014-06-10 DIAGNOSIS — I739 Peripheral vascular disease, unspecified: Secondary | ICD-10-CM

## 2014-06-10 DIAGNOSIS — R29898 Other symptoms and signs involving the musculoskeletal system: Secondary | ICD-10-CM | POA: Diagnosis not present

## 2014-06-10 DIAGNOSIS — I70219 Atherosclerosis of native arteries of extremities with intermittent claudication, unspecified extremity: Secondary | ICD-10-CM

## 2014-06-10 NOTE — Patient Instructions (Signed)
Stroke Prevention Some medical conditions and behaviors are associated with an increased chance of having a stroke. You may prevent a stroke by making healthy choices and managing medical conditions. HOW CAN I REDUCE MY RISK OF HAVING A STROKE?   Stay physically active. Get at least 30 minutes of activity on most or all days.  Do not smoke. It may also be helpful to avoid exposure to secondhand smoke.  Limit alcohol use. Moderate alcohol use is considered to be:  No more than 2 drinks per day for men.  No more than 1 drink per day for nonpregnant women.  Eat healthy foods. This involves:  Eating 5 or more servings of fruits and vegetables a day.  Making dietary changes that address high blood pressure (hypertension), high cholesterol, diabetes, or obesity.  Manage your cholesterol levels.  Making food choices that are high in fiber and low in saturated fat, trans fat, and cholesterol may control cholesterol levels.  Take any prescribed medicines to control cholesterol as directed by your health care provider.  Manage your diabetes.  Controlling your carbohydrate and sugar intake is recommended to manage diabetes.  Take any prescribed medicines to control diabetes as directed by your health care provider.  Control your hypertension.  Making food choices that are low in salt (sodium), saturated fat, trans fat, and cholesterol is recommended to manage hypertension.  Take any prescribed medicines to control hypertension as directed by your health care provider.  Maintain a healthy weight.  Reducing calorie intake and making food choices that are low in sodium, saturated fat, trans fat, and cholesterol are recommended to manage weight.  Stop drug abuse.  Avoid taking birth control pills.  Talk to your health care provider about the risks of taking birth control pills if you are over 35 years old, smoke, get migraines, or have ever had a blood clot.  Get evaluated for sleep  disorders (sleep apnea).  Talk to your health care provider about getting a sleep evaluation if you snore a lot or have excessive sleepiness.  Take medicines only as directed by your health care provider.  For some people, aspirin or blood thinners (anticoagulants) are helpful in reducing the risk of forming abnormal blood clots that can lead to stroke. If you have the irregular heart rhythm of atrial fibrillation, you should be on a blood thinner unless there is a good reason you cannot take them.  Understand all your medicine instructions.  Make sure that other conditions (such as anemia or atherosclerosis) are addressed. SEEK IMMEDIATE MEDICAL CARE IF:   You have sudden weakness or numbness of the face, arm, or leg, especially on one side of the body.  Your face or eyelid droops to one side.  You have sudden confusion.  You have trouble speaking (aphasia) or understanding.  You have sudden trouble seeing in one or both eyes.  You have sudden trouble walking.  You have dizziness.  You have a loss of balance or coordination.  You have a sudden, severe headache with no known cause.  You have new chest pain or an irregular heartbeat. Any of these symptoms may represent a serious problem that is an emergency. Do not wait to see if the symptoms will go away. Get medical help at once. Call your local emergency services (911 in [place] Do not drive yourself to the hospital. Document Released: 07/20/2004 Document Revised: 10/27/2013 Document Reviewed: 12/13/2012 [employer] Patient Information 2015 [employer]. This information is not intended to replace advice given   to you by your health care provider. Make sure you discuss any questions you have with your health care provider.     Peripheral Vascular Disease Peripheral Vascular Disease (PVD), also called Peripheral Arterial Disease (PAD), is a circulation problem caused by cholesterol (atherosclerotic plaque) deposits in the  arteries. PVD commonly occurs in the lower extremities (legs) but it can occur in other areas of the body, such as your arms. The cholesterol buildup in the arteries reduces blood flow which can cause pain and other serious problems. The presence of PVD can place a person at risk for Coronary Artery Disease (CAD).  CAUSES  Causes of PVD can be many. It is usually associated with more than one risk factor such as:   High Cholesterol.  Smoking.  Diabetes.  Lack of exercise or inactivity.  High blood pressure (hypertension).  Obesity.  Family history. SYMPTOMS   When the lower extremities are affected, patients with PVD may experience:  Leg pain with exertion or physical activity. This is called INTERMITTENT CLAUDICATION. This may present as cramping or numbness with physical activity. The location of the pain is associated with the level of blockage. For example, blockage at the abdominal level (distal abdominal aorta) may result in buttock or hip pain. Lower leg arterial blockage may result in calf pain.  As PVD becomes more severe, pain can develop with less physical activity.  In people with severe PVD, leg pain may occur at rest.  Other PVD signs and symptoms:  Leg numbness or weakness.  Coldness in the affected leg or foot, especially when compared to the other leg.  A change in leg color.  Patients with significant PVD are more prone to ulcers or sores on toes, feet or legs. These may take longer to heal or may reoccur. The ulcers or sores can become infected.  If signs and symptoms of PVD are ignored, gangrene may occur. This can result in the loss of toes or loss of an entire limb.  Not all leg pain is related to PVD. Other medical conditions can cause leg pain such as:  Blood clots (embolism) or Deep Vein Thrombosis.  Inflammation of the blood vessels (vasculitis).  Spinal stenosis. DIAGNOSIS  Diagnosis of PVD can involve several different types of tests. These  can include:  Pulse Volume Recording Method (PVR). This test is simple, painless and does not involve the use of X-rays. PVR involves measuring and comparing the blood pressure in the arms and legs. An ABI (Ankle-Brachial Index) is calculated. The normal ratio of blood pressures is 1. As this number becomes smaller, it indicates more severe disease.  < 0.95 - indicates significant narrowing in one or more leg vessels.  <0.8 - there will usually be pain in the foot, leg or buttock with exercise.  <0.4 - will usually have pain in the legs at rest.  <0.25 - usually indicates limb threatening PVD.  Doppler detection of pulses in the legs. This test is painless and checks to see if you have a pulses in your legs/feet.  A dye or contrast material (a substance that highlights the blood vessels so they show up on x-ray) may be given to help your caregiver better see the arteries for the following tests. The dye is eliminated from your body by the kidney's. Your caregiver may order blood work to check your kidney function and other laboratory values before the following tests are performed:  Magnetic Resonance Angiography (MRA). An MRA is a picture study of the   blood vessels and arteries. The MRA machine uses a large magnet to produce images of the blood vessels.  Computed Tomography Angiography (CTA). A CTA is a specialized x-ray that looks at how the blood flows in your blood vessels. An IV may be inserted into your arm so contrast dye can be injected.  Angiogram. Is a procedure that uses x-rays to look at your blood vessels. This procedure is minimally invasive, meaning a small incision (cut) is made in your groin. A small tube (catheter) is then inserted into the artery of your groin. The catheter is guided to the blood vessel or artery your caregiver wants to examine. Contrast dye is injected into the catheter. X-rays are then taken of the blood vessel or artery. After the images are obtained, the  catheter is taken out. TREATMENT  Treatment of PVD involves many interventions which may include:  Lifestyle changes:  Quitting smoking.  Exercise.  Following a low fat, low cholesterol diet.  Control of diabetes.  Foot care is very important to the PVD patient. Good foot care can help prevent infection.  Medication:  Cholesterol-lowering medicine.  Blood pressure medicine.  Anti-platelet drugs.  Certain medicines may reduce symptoms of Intermittent Claudication.  Interventional/Surgical options:  Angioplasty. An Angioplasty is a procedure that inflates a balloon in the blocked artery. This opens the blocked artery to improve blood flow.  Stent Implant. A wire mesh tube (stent) is placed in the artery. The stent expands and stays in place, allowing the artery to remain open.  Peripheral Bypass Surgery. This is a surgical procedure that reroutes the blood around a blocked artery to help improve blood flow. This type of procedure may be performed if Angioplasty or stent implants are not an option. SEEK IMMEDIATE MEDICAL CARE IF:   You develop pain or numbness in your arms or legs.  Your arm or leg turns cold, becomes blue in color.  You develop redness, warmth, swelling and pain in your arms or legs. MAKE SURE YOU:   Understand these instructions.  Will watch your condition.  Will get help right away if you are not doing well or get worse. Document Released: 07/20/2004 Document Revised: 09/04/2011 Document Reviewed: 06/16/2008 Exit[employer] Patient Information 2015 [employer]. This information is not intended to replace advice given to you by your health care provider. Make sure you discuss any questions you have with your health care provider.    Smoking Cessation Quitting smoking is important to your health and has many advantages. However, it is not always easy to quit since nicotine is a very addictive drug. Oftentimes, people try 3 times or more before being  able to quit. This document explains the best ways for you to prepare to quit smoking. Quitting takes hard work and a lot of effort, but you can do it. ADVANTAGES OF QUITTING SMOKING  You will live longer, feel better, and live better.  Your body will feel the impact of quitting smoking almost immediately.  Within 20 minutes, blood pressure decreases. Your pulse returns to its normal level.  After 8 hours, carbon monoxide levels in the blood return to normal. Your oxygen level increases.  After 24 hours, the chance of having a heart attack starts to decrease. Your breath, hair, and body stop smelling like smoke.  After 48 hours, damaged nerve endings begin to recover. Your sense of taste and smell improve.  After 72 hours, the body is virtually free of nicotine. Your bronchial tubes relax and breathing becomes easier.    After 2 to 12 weeks, lungs can hold more air. Exercise becomes easier and circulation improves.  The risk of having a heart attack, stroke, cancer, or lung disease is greatly reduced.  After 1 year, the risk of coronary heart disease is cut in half.  After 5 years, the risk of stroke falls to the same as a nonsmoker.  After 10 years, the risk of lung cancer is cut in half and the risk of other cancers decreases significantly.  After 15 years, the risk of coronary heart disease drops, usually to the level of a nonsmoker.  If you are pregnant, quitting smoking will improve your chances of having a healthy baby.  The people you live with, especially any children, will be healthier.  You will have extra money to spend on things other than cigarettes. QUESTIONS TO THINK ABOUT BEFORE ATTEMPTING TO QUIT You may want to talk about your answers with your health care provider.  Why do you want to quit?  If you tried to quit in the past, what helped and what did not?  What will be the most difficult situations for you after you quit? How will you plan to handle  them?  Who can help you through the tough times? Your family? Friends? A health care provider?  What pleasures do you get from smoking? What ways can you still get pleasure if you quit? Here are some questions to ask your health care provider:  How can you help me to be successful at quitting?  What medicine do you think would be best for me and how should I take it?  What should I do if I need more help?  What is smoking withdrawal like? How can I get information on withdrawal? GET READY  Set a quit date.  Change your environment by getting rid of all cigarettes, ashtrays, matches, and lighters in your home, car, or work. Do not let people smoke in your home.  Review your past attempts to quit. Think about what worked and what did not. GET SUPPORT AND ENCOURAGEMENT You have a better chance of being successful if you have help. You can get support in many ways.  Tell your family, friends, and coworkers that you are going to quit and need their support. Ask them not to smoke around you.  Get individual, group, or telephone counseling and support. Programs are available at local hospitals and health centers. Call your local health department for information about programs in your area.  Spiritual beliefs and practices may help some smokers quit.  Download a "quit meter" on your computer to keep track of quit statistics, such as how long you have gone without smoking, cigarettes not smoked, and money saved.  Get a self-help book about quitting smoking and staying off tobacco. LEARN NEW SKILLS AND BEHAVIORS  Distract yourself from urges to smoke. Talk to someone, go for a walk, or occupy your time with a task.  Change your normal routine. Take a different route to work. Drink tea instead of coffee. Eat breakfast in a different place.  Reduce your stress. Take a hot bath, exercise, or read a book.  Plan something enjoyable to do every day. Reward yourself for not  smoking.  Explore interactive web-based programs that specialize in helping you quit. GET MEDICINE AND USE IT CORRECTLY Medicines can help you stop smoking and decrease the urge to smoke. Combining medicine with the above behavioral methods and support can greatly increase your chances of successfully quitting smoking.    Nicotine replacement therapy helps deliver nicotine to your body without the negative effects and risks of smoking. Nicotine replacement therapy includes nicotine gum, lozenges, inhalers, nasal sprays, and skin patches. Some may be available over-the-counter and others require a prescription.  Antidepressant medicine helps people abstain from smoking, but how this works is unknown. This medicine is available by prescription.  Nicotinic receptor partial agonist medicine simulates the effect of nicotine in your brain. This medicine is available by prescription. Ask your health care provider for advice about which medicines to use and how to use them based on your health history. Your health care provider will tell you what side effects to look out for if you choose to be on a medicine or therapy. Carefully read the information on the package. Do not use any other product containing nicotine while using a nicotine replacement product.  RELAPSE OR DIFFICULT SITUATIONS Most relapses occur within the first 3 months after quitting. Do not be discouraged if you start smoking again. Remember, most people try several times before finally quitting. You may have symptoms of withdrawal because your body is used to nicotine. You may crave cigarettes, be irritable, feel very hungry, cough often, get headaches, or have difficulty concentrating. The withdrawal symptoms are only temporary. They are strongest when you first quit, but they will go away within 10-14 days. To reduce the chances of relapse, try to:  Avoid drinking alcohol. Drinking lowers your chances of successfully quitting.  Reduce the  amount of caffeine you consume. Once you quit smoking, the amount of caffeine in your body increases and can give you symptoms, such as a rapid heartbeat, sweating, and anxiety.  Avoid smokers because they can make you want to smoke.  Do not let weight gain distract you. Many smokers will gain weight when they quit, usually less than 10 pounds. Eat a healthy diet and stay active. You can always lose the weight gained after you quit.  Find ways to improve your mood other than smoking. FOR MORE INFORMATION  www.smokefree.gov  Document Released: 06/06/2001 Document Revised: 10/27/2013 Document Reviewed: 09/21/2011 [employer] Patient Information 2015 [employer]. This information is not intended to replace advice given to you by your health care provider. Make sure you discuss any questions you have with your health care provider.    Smoking Cessation, Tips for Success If you are ready to quit smoking, congratulations! You have chosen to help yourself be healthier. Cigarettes bring nicotine, tar, carbon monoxide, and other irritants into your body. Your lungs, heart, and blood vessels will be able to work better without these poisons. There are many different ways to quit smoking. Nicotine gum, nicotine patches, a nicotine inhaler, or nicotine nasal spray can help with physical craving. Hypnosis, support groups, and medicines help break the habit of smoking. WHAT THINGS CAN I DO TO MAKE QUITTING EASIER?  Here are some tips to help you quit for good:  Pick a date when you will quit smoking completely. Tell all of your friends and family about your plan to quit on that date.  Do not try to slowly cut down on the number of cigarettes you are smoking. Pick a quit date and quit smoking completely starting on that day.  Throw away all cigarettes.   Clean and remove all ashtrays from your home, work, and car.  On a card, write down your reasons for quitting. Carry the card with you and read it  when you get the urge to smoke.  Cleanse your body   of nicotine. Drink enough water and fluids to keep your urine clear or pale yellow. Do this after quitting to flush the nicotine from your body.  Learn to predict your moods. Do not let a bad situation be your excuse to have a cigarette. Some situations in your life might tempt you into wanting a cigarette.  Never have "just one" cigarette. It leads to wanting another and another. Remind yourself of your decision to quit.  Change habits associated with smoking. If you smoked while driving or when feeling stressed, try other activities to replace smoking. Stand up when drinking your coffee. Brush your teeth after eating. Sit in a different chair when you read the paper. Avoid alcohol while trying to quit, and try to drink fewer caffeinated beverages. Alcohol and caffeine may urge you to smoke.  Avoid foods and drinks that can trigger a desire to smoke, such as sugary or spicy foods and alcohol.  Ask people who smoke not to smoke around you.  Have something planned to do right after eating or having a cup of coffee. For example, plan to take a walk or exercise.  Try a relaxation exercise to calm you down and decrease your stress. Remember, you may be tense and nervous for the first 2 weeks after you quit, but this will pass.  Find new activities to keep your hands busy. Play with a pen, coin, or rubber band. Doodle or draw things on paper.  Brush your teeth right after eating. This will help cut down on the craving for the taste of tobacco after meals. You can also try mouthwash.   Use oral substitutes in place of cigarettes. Try using lemon drops, carrots, cinnamon sticks, or chewing gum. Keep them handy so they are available when you have the urge to smoke.  When you have the urge to smoke, try deep breathing.  Designate your home as a nonsmoking area.  If you are a heavy smoker, ask your health care provider about a prescription for  nicotine chewing gum. It can ease your withdrawal from nicotine.  Reward yourself. Set aside the cigarette money you save and buy yourself something nice.  Look for support from others. Join a support group or smoking cessation program. Ask someone at home or at work to help you with your plan to quit smoking.  Always ask yourself, "Do I need this cigarette or is this just a reflex?" Tell yourself, "Today, I choose not to smoke," or "I do not want to smoke." You are reminding yourself of your decision to quit.  Do not replace cigarette smoking with electronic cigarettes (commonly called e-cigarettes). The safety of e-cigarettes is unknown, and some may contain harmful chemicals.  If you relapse, do not give up! Plan ahead and think about what you will do the next time you get the urge to smoke. HOW WILL I FEEL WHEN I QUIT SMOKING? You may have symptoms of withdrawal because your body is used to nicotine (the addictive substance in cigarettes). You may crave cigarettes, be irritable, feel very hungry, cough often, get headaches, or have difficulty concentrating. The withdrawal symptoms are only temporary. They are strongest when you first quit but will go away within 10-14 days. When withdrawal symptoms occur, stay in control. Think about your reasons for quitting. Remind yourself that these are signs that your body is healing and getting used to being without cigarettes. Remember that withdrawal symptoms are easier to treat than the major diseases that smoking can cause.    Even after the withdrawal is over, expect periodic urges to smoke. However, these cravings are generally short lived and will go away whether you smoke or not. Do not smoke! WHAT RESOURCES ARE AVAILABLE TO HELP ME QUIT SMOKING? Your health care provider can direct you to community resources or hospitals for support, which may include:  Group support.  Education.  Hypnosis.  Therapy. Document Released: 03/10/2004 Document  Revised: 10/27/2013 Document Reviewed: 11/28/2012 [employer] Patient Information 2015 [employer]. This information is not intended to replace advice given to you by your health care provider. Make sure you discuss any questions you have with your health care provider.  

## 2014-06-10 NOTE — Progress Notes (Signed)
[facility] [place] HISTORY AND PHYSICAL   MRN : [id number]  History of Present Illness:   Bobby Vega is a 79 y.o. male patient of Dr. Trula Slade who is s/p aortobifemoral bypass graft by Dr. Trula Slade in January of 2013. In November, 2014, he had a left brain stroke associated with right upper extremity and right lower extremity weakness. The right lower extremity weakness resolved. He has persistent right upper extremity weakness and pain. He underwent an extensive workup at [facility] and was found to have significant intracranial disease on MRA. There was no significant extracranial carotid disease.  Of note aspirin and Plavix were started on that admission. Of note, the patient had developed some left upper extremity weakness after his aortobifemoral bypass graft. Dr. Nicole Cella office note from December 2014 indicates he had no significant extracranial carotid disease. Dr. Scot Dock did not think that a cerebral arteriogram was indicated at that point given that he has known diffuse intracranial disease and therefore this is likely the cause of his recent stroke. Given that the disease was fairly diffuse again Dr. Scot Dock did not recommend cerebral arteriography and consideration for intracranial angioplasty or stenting at this point. Dr. Scot Dock advised that pt continue his aspirin and Plavix and risk factor management by his primary care physician. Pt walks about 100 feet when he develops claudication pain and weakness in both calves and feet, relieved by rest, pt denies non healing wounds. Pt and daughter deny any further stroke or TIA symptoms since November, 2014.  Pt Diabetic: No Pt smoker: smoker (1-2 cigars/day, stopped cigarettes many years ago) The ASA was stopped and he takes daily Plavix. He also takes a daily statin.   Current Outpatient Prescriptions  Medication Sig Dispense Refill  . amLODipine (NORVASC) 10 MG tablet Take 1 tablet (10 mg total) by mouth daily.  30 tablet 4  . aspirin 81 MG EC tablet Take 1 tablet (81 mg total) by mouth daily. For 3 months with plavix. 30 tablet 3  . atorvastatin (LIPITOR) 20 MG tablet Take 1 tablet (20 mg total) by mouth daily. 30 tablet 4  . ciprofloxacin (CIPRO) 500 MG tablet Take 1 tablet (500 mg total) by mouth 2 (two) times daily. X 6days 12 tablet 0  . clopidogrel (PLAVIX) 75 MG tablet Take 1 tablet (75 mg total) by mouth daily with breakfast. 30 tablet 4  . esomeprazole (NEXIUM) 40 MG capsule Take 40 mg by mouth daily before breakfast.     . furosemide (LASIX) 20 MG tablet daily.    . hydrALAZINE (APRESOLINE) 25 MG tablet Take 1 tablet (25 mg total) by mouth 2 (two) times daily. 60 tablet 3  . HYDROcodone-acetaminophen (NORCO/VICODIN) 5-325 MG per tablet Take 1 tablet by mouth daily as needed (pain).     . pregabalin (LYRICA) 75 MG capsule Take 75 mg by mouth 3 (three) times daily.     . Vitamin D, Ergocalciferol, (DRISDOL) 50000 UNITS CAPS capsule Take 50,000 Units by mouth every 7 (seven) days. Take on Friday or Saturday     No current facility-administered medications for this visit.    Past Medical History  Diagnosis Date  . Hyperlipidemia     takes Zocor nightly  . BPH (benign prostatic hyperplasia)     takes Avodart daily  . Constipation     Metamucil prn  . Borderline diabetes   . Pain in joint, hand   . ED (erectile dysfunction)     takes Cialis as needed as well as  Viagra  . GERD (gastroesophageal reflux disease)     takes Nexium daily  . Hypertension     takes Losartan daily  . Peripheral vascular disease   . Embolism - blood clot     in stomach  . Bronchitis     yrs ago  . Arthritis     hip  . Lumbar spondylosis with myelopathy   . Cervical spondylosis without myelopathy   . Cervical spondylosis with myelopathy   . Lumbar spondylosis with myelopathy   . Chronic back pain   . Diverticulosis   . Urinary urgency   . Nocturia   . Stroke     Social History History  Substance  Use Topics  . Smoking status: Former Smoker -- .5 years    Types: Cigarettes, Cigars    Quit date: 04/29/1992  . Smokeless tobacco: Never Used  . Alcohol Use: 1.0 oz/week    2 drink(s) per week     Comment: red wine    Family History Family History  Problem Relation Age of Onset  . Anesthesia problems Neg Hx   . Hypotension Neg Hx   . Malignant hyperthermia Neg Hx   . Pseudochol deficiency Neg Hx   . Heart disease Mother     Before age 57  . Heart disease Brother     NOT  before age 82    Surgical History Past Surgical History  Procedure Laterality Date  . Skin graft  1983  . Aorta - bilateral femoral artery bypass graft  07/13/2011    Procedure: AORTA BIFEMORAL BYPASS GRAFT;  Surgeon: Theotis Burrow, MD;  Location: [place];  Service: Vascular;  Laterality: Bilateral;  . Cystoscopy  07/13/2011    Procedure: CYSTOSCOPY;  Surgeon: Hanley Ben, MD;  Location: [place];  Service: Urology;  Laterality: N/A;  cystoscopy,dilatation & insertion of councill foley catheter  . Abdominal aortagram N/A 07/04/2011    Procedure: ABDOMINAL Maxcine Ham;  Surgeon: Serafina Mitchell, MD;  Location: [facility] CATH LAB;  Service: Cardiovascular;  Laterality: N/A;  . Lower extremity angiogram Bilateral 07/04/2011    Procedure: LOWER EXTREMITY ANGIOGRAM;  Surgeon: Serafina Mitchell, MD;  Location: [facility] CATH LAB;  Service: Cardiovascular;  Laterality: Bilateral;    No Known Allergies  Current Outpatient Prescriptions  Medication Sig Dispense Refill  . amLODipine (NORVASC) 10 MG tablet Take 1 tablet (10 mg total) by mouth daily. 30 tablet 4  . aspirin 81 MG EC tablet Take 1 tablet (81 mg total) by mouth daily. For 3 months with plavix. 30 tablet 3  . atorvastatin (LIPITOR) 20 MG tablet Take 1 tablet (20 mg total) by mouth daily. 30 tablet 4  . ciprofloxacin (CIPRO) 500 MG tablet Take 1 tablet (500 mg total) by mouth 2 (two) times daily. X 6days 12 tablet 0  . clopidogrel (PLAVIX) 75 MG tablet Take 1 tablet (75 mg  total) by mouth daily with breakfast. 30 tablet 4  . esomeprazole (NEXIUM) 40 MG capsule Take 40 mg by mouth daily before breakfast.     . furosemide (LASIX) 20 MG tablet daily.    . hydrALAZINE (APRESOLINE) 25 MG tablet Take 1 tablet (25 mg total) by mouth 2 (two) times daily. 60 tablet 3  . HYDROcodone-acetaminophen (NORCO/VICODIN) 5-325 MG per tablet Take 1 tablet by mouth daily as needed (pain).     . pregabalin (LYRICA) 75 MG capsule Take 75 mg by mouth 3 (three) times daily.     . Vitamin D, Ergocalciferol, (DRISDOL) 50000 UNITS  CAPS capsule Take 50,000 Units by mouth every 7 (seven) days. Take on Friday or Saturday     No current facility-administered medications for this visit.     REVIEW OF SYSTEMS: See HPI for pertinent positives and negatives.  Physical Examination Filed Vitals:   06/10/14 1534  BP: 157/82  Pulse: 60  Resp: 16  Height: 5\' 6"  (1.676 m)  Weight: 165 lb (74.844 kg)  SpO2: 99%   Body mass index is 26.64 kg/(m^2).  General: WDWN in NAD Gait: Normal HENT: WNL Eyes: Pupils equal, bilateral arcus senilis.  Pulmonary: normal non-labored breathing , without Rales, rhonchi, wheezing Cardiac: RRR, no murmur detected  Abdomen: soft, NT, no palpable masses Skin: no rashes, ulcers noted; no Gangrene , no cellulitis; no open wounds.   VASCULAR EXAM  Carotid Bruits Left Right   Negative Negative  Aorta is not palpable Radial pulses are 2+ palpable and =   VASCULAR EXAM: Extremities without ischemic changes  without Gangrene; without open wounds.     LE Pulses LEFT RIGHT   FEMORAL 2+ palpable 2+ palpable    POPLITEAL not palpable  not palpable   POSTERIOR TIBIAL not palpable  not palpable    DORSALIS PEDIS  ANTERIOR TIBIAL not palpable   not palpable     Musculoskeletal: no muscle wasting or atrophy; no peripheral edema Neurologic: A&O X 3; Appropriate Affect ;  SENSATION: normal; MOTOR FUNCTION: 5/5 Symmetric, CN 2-12 intact Speech is fluent/normal    Non-Invasive Vascular Imaging (06/10/2014): ABI (Date: 06/10/2014)  R: 0.60 (12/01/13, 0.48), DP: 0.60, monophasic, PT: 0.59, monophasic, TBI: 0.50  L: 0.67 (0.66), DP: 0.62, monophasic, PT: 0.67, monophasic, TBI: 0.58    ASSESSMENT:  Bobby Vega is a 79 y.o. male who is s/p aortobifemoral bypass graft in January of 2013. In November, 2014, he had a left brain stroke associated with right upper extremity and right lower extremity weakness. The right lower extremity weakness resolved. He has persistent right upper extremity weakness and pain. He underwent an extensive workup at [facility] and was found to have significant intracranial disease on MRA. There was no significant extracranial carotid disease. Dr. Stephens Shire assessment note from November 2013 indicates the pt's symptoms of rest pain have resolved. He still has mild symptoms of claudication, but they are certainly not lifestyle limiting. He has known outflow and runoff disease, and therefore future revascularizations will need to be for either rest pain or ulceration. Pt states he has no issues re falling. He smokes 1-2 cigars daily, stopped smoking cigarettes years ago. He has moderate claudication symptoms with walking, no rest pain, no tissue loss. Right ABI improved from severe to moderate arterial occlusive disease, left ABI remains stable with evidence of moderate arterial occlusive disease.  Continue conservative approach.   PLAN:   Graduated walking program. Daily seated leg exercises as discussed and demonstrated. The patient was counseled re smoking cessation and given several free resources re smoking cessation. Continued maximal medical management of atherosclerotic risk factors.   Based on  today's exam and non-invasive vascular lab results, the patient will follow up in 6 months wiith the following tests: carotid Duplex and ABI's. I discussed in depth with the patient the nature of atherosclerosis, and emphasized the importance of maximal medical management including strict control of blood pressure, blood glucose, and lipid levels, obtaining regular exercise, and cessation of smoking.  The patient is aware that without maximal medical management the underlying atherosclerotic disease process will progress, limiting the benefit of any interventions.  The patient was given information about stroke prevention and what symptoms should prompt the patient to seek immediate medical care.  The patient was given information about PAD including signs, symptoms, treatment, what symptoms should prompt the patient to seek immediate medical care, and risk reduction measures to take. Thank you for allowing [place] to participate in this patient's care.  Clemon Chambers, RN, MSN, FNP-C [facility] Office: [phone]  Clinic MD: Scot Dock 06/10/2014 3:29 PM

## 2014-06-11 NOTE — Addendum Note (Signed)
Addended by: Mena Goes on: 06/11/2014 04:20 PM   Modules accepted: Orders

## 2014-11-01 ENCOUNTER — Encounter (HOSPITAL_COMMUNITY): Payer: Self-pay | Admitting: *Deleted

## 2014-11-01 ENCOUNTER — Emergency Department (INDEPENDENT_AMBULATORY_CARE_PROVIDER_SITE_OTHER): Payer: Medicare HMO

## 2014-11-01 ENCOUNTER — Emergency Department (INDEPENDENT_AMBULATORY_CARE_PROVIDER_SITE_OTHER)
Admission: EM | Admit: 2014-11-01 | Discharge: 2014-11-01 | Disposition: A | Discharge status: Home / Self Care | Payer: Medicare HMO | Source: Home / Self Care | Attending: Family Medicine | Admitting: Family Medicine

## 2014-11-01 DIAGNOSIS — M25511 Pain in right shoulder: Secondary | ICD-10-CM | POA: Diagnosis not present

## 2014-11-01 DIAGNOSIS — M79641 Pain in right hand: Secondary | ICD-10-CM | POA: Diagnosis not present

## 2014-11-01 NOTE — Discharge Instructions (Signed)
Ice and advil or tylenol as needed
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
## 2014-11-01 NOTE — ED Notes (Signed)
Ice pack provided with instructions.

## 2014-12-16 ENCOUNTER — Encounter: Payer: Self-pay | Admitting: Family

## 2014-12-17 ENCOUNTER — Other Ambulatory Visit: Payer: Self-pay | Admitting: Family

## 2014-12-17 DIAGNOSIS — Z48812 Encounter for surgical aftercare following surgery on the circulatory system: Secondary | ICD-10-CM

## 2014-12-17 DIAGNOSIS — I739 Peripheral vascular disease, unspecified: Secondary | ICD-10-CM

## 2014-12-17 DIAGNOSIS — I6523 Occlusion and stenosis of bilateral carotid arteries: Secondary | ICD-10-CM

## 2014-12-21 ENCOUNTER — Encounter (HOSPITAL_COMMUNITY): Payer: Medicare HMO

## 2014-12-21 ENCOUNTER — Ambulatory Visit: Payer: Medicare HMO | Admitting: Family

## 2014-12-21 ENCOUNTER — Other Ambulatory Visit (HOSPITAL_COMMUNITY): Payer: Medicare HMO

## 2015-01-12 ENCOUNTER — Encounter: Payer: Self-pay | Admitting: Family

## 2015-01-15 ENCOUNTER — Encounter (HOSPITAL_COMMUNITY): Payer: Medicare HMO

## 2015-01-15 ENCOUNTER — Ambulatory Visit: Payer: Medicare HMO | Admitting: Family

## 2015-02-25 ENCOUNTER — Encounter: Payer: Self-pay | Admitting: Family

## 2015-02-26 ENCOUNTER — Encounter (HOSPITAL_COMMUNITY): Payer: Medicare HMO

## 2015-02-26 ENCOUNTER — Ambulatory Visit: Payer: Medicare HMO | Admitting: Family

## 2015-03-05 ENCOUNTER — Ambulatory Visit (HOSPITAL_COMMUNITY)
Admission: RE | Admit: 2015-03-05 | Discharge: 2015-03-05 | Disposition: A | Discharge status: Home / Self Care | Payer: Medicare HMO | Source: Ambulatory Visit | Attending: Surgery | Admitting: Surgery

## 2015-03-05 ENCOUNTER — Ambulatory Visit (INDEPENDENT_AMBULATORY_CARE_PROVIDER_SITE_OTHER)
Admission: RE | Admit: 2015-03-05 | Discharge: 2015-03-05 | Disposition: A | Discharge status: Home / Self Care | Payer: Medicare HMO | Source: Ambulatory Visit | Attending: Surgery | Admitting: Surgery

## 2015-03-05 ENCOUNTER — Encounter: Payer: Self-pay | Admitting: Family

## 2015-03-05 DIAGNOSIS — Z48812 Encounter for surgical aftercare following surgery on the circulatory system: Secondary | ICD-10-CM | POA: Diagnosis not present

## 2015-03-05 DIAGNOSIS — I6523 Occlusion and stenosis of bilateral carotid arteries: Secondary | ICD-10-CM

## 2015-03-05 DIAGNOSIS — I739 Peripheral vascular disease, unspecified: Secondary | ICD-10-CM

## 2015-03-08 ENCOUNTER — Ambulatory Visit: Payer: Medicare HMO | Admitting: Family

## 2015-03-09 ENCOUNTER — Encounter: Payer: Self-pay | Admitting: Family

## 2015-03-10 ENCOUNTER — Ambulatory Visit: Payer: Medicare HMO | Admitting: Family

## 2015-03-12 ENCOUNTER — Ambulatory Visit (INDEPENDENT_AMBULATORY_CARE_PROVIDER_SITE_OTHER): Payer: Medicare HMO | Admitting: Family

## 2015-03-12 ENCOUNTER — Encounter: Payer: Self-pay | Admitting: Family

## 2015-03-12 VITALS — BP 145/83 | HR 62 | Temp 97.4°F | Resp 16 | Ht 64.0 in | Wt 164.0 lb

## 2015-03-12 DIAGNOSIS — F17290 Nicotine dependence, other tobacco product, uncomplicated: Secondary | ICD-10-CM

## 2015-03-12 DIAGNOSIS — I6523 Occlusion and stenosis of bilateral carotid arteries: Secondary | ICD-10-CM | POA: Diagnosis not present

## 2015-03-12 DIAGNOSIS — Z8673 Personal history of transient ischemic attack (TIA), and cerebral infarction without residual deficits: Secondary | ICD-10-CM

## 2015-03-12 DIAGNOSIS — Z95828 Presence of other vascular implants and grafts: Secondary | ICD-10-CM

## 2015-03-12 DIAGNOSIS — Z720 Tobacco use: Secondary | ICD-10-CM | POA: Diagnosis not present

## 2015-03-12 DIAGNOSIS — Z9889 Other specified postprocedural states: Secondary | ICD-10-CM | POA: Diagnosis not present

## 2015-03-12 DIAGNOSIS — Z48812 Encounter for surgical aftercare following surgery on the circulatory system: Secondary | ICD-10-CM

## 2015-03-12 NOTE — Patient Instructions (Signed)
Stroke Prevention Some medical conditions and behaviors are associated with an increased chance of having a stroke. You may prevent a stroke by making healthy choices and managing medical conditions. HOW CAN I REDUCE MY RISK OF HAVING A STROKE?   Stay physically active. Get at least 30 minutes of activity on most or all days.  Do not smoke. It may also be helpful to avoid exposure to secondhand smoke.  Limit alcohol use. Moderate alcohol use is considered to be:  No more than 2 drinks per day for men.  No more than 1 drink per day for nonpregnant women.  Eat healthy foods. This involves:  Eating 5 or more servings of fruits and vegetables a day.  Making dietary changes that address high blood pressure (hypertension), high cholesterol, diabetes, or obesity.  Manage your cholesterol levels.  Making food choices that are high in fiber and low in saturated fat, trans fat, and cholesterol may control cholesterol levels.  Take any prescribed medicines to control cholesterol as directed by your health care provider.  Manage your diabetes.  Controlling your carbohydrate and sugar intake is recommended to manage diabetes.  Take any prescribed medicines to control diabetes as directed by your health care provider.  Control your hypertension.  Making food choices that are low in salt (sodium), saturated fat, trans fat, and cholesterol is recommended to manage hypertension.  Take any prescribed medicines to control hypertension as directed by your health care provider.  Maintain a healthy weight.  Reducing calorie intake and making food choices that are low in sodium, saturated fat, trans fat, and cholesterol are recommended to manage weight.  Stop drug abuse.  Avoid taking birth control pills.  Talk to your health care provider about the risks of taking birth control pills if you are over 35 years old, smoke, get migraines, or have ever had a blood clot.  Get evaluated for sleep  disorders (sleep apnea).  Talk to your health care provider about getting a sleep evaluation if you snore a lot or have excessive sleepiness.  Take medicines only as directed by your health care provider.  For some people, aspirin or blood thinners (anticoagulants) are helpful in reducing the risk of forming abnormal blood clots that can lead to stroke. If you have the irregular heart rhythm of atrial fibrillation, you should be on a blood thinner unless there is a good reason you cannot take them.  Understand all your medicine instructions.  Make sure that other conditions (such as anemia or atherosclerosis) are addressed. SEEK IMMEDIATE MEDICAL CARE IF:   You have sudden weakness or numbness of the face, arm, or leg, especially on one side of the body.  Your face or eyelid droops to one side.  You have sudden confusion.  You have trouble speaking (aphasia) or understanding.  You have sudden trouble seeing in one or both eyes.  You have sudden trouble walking.  You have dizziness.  You have a loss of balance or coordination.  You have a sudden, severe headache with no known cause.  You have new chest pain or an irregular heartbeat. Any of these symptoms may represent a serious problem that is an emergency. Do not wait to see if the symptoms will go away. Get medical help at once. Call your local emergency services (911 in [place] Do not drive yourself to the hospital. Document Released: 07/20/2004 Document Revised: 10/27/2013 Document Reviewed: 12/13/2012 [employer] Patient Information 2015 [employer]. This information is not intended to replace advice given   to you by your health care provider. Make sure you discuss any questions you have with your health care provider.   Peripheral Vascular Disease Peripheral Vascular Disease (PVD), also called Peripheral Arterial Disease (PAD), is a circulation problem caused by cholesterol (atherosclerotic plaque) deposits in the arteries.  PVD commonly occurs in the lower extremities (legs) but it can occur in other areas of the body, such as your arms. The cholesterol buildup in the arteries reduces blood flow which can cause pain and other serious problems. The presence of PVD can place a person at risk for Coronary Artery Disease (CAD).  CAUSES  Causes of PVD can be many. It is usually associated with more than one risk factor such as:   High Cholesterol.  Smoking.  Diabetes.  Lack of exercise or inactivity.  High blood pressure (hypertension).  Obesity.  Family history. SYMPTOMS   When the lower extremities are affected, patients with PVD may experience:  Leg pain with exertion or physical activity. This is called INTERMITTENT CLAUDICATION. This may present as cramping or numbness with physical activity. The location of the pain is associated with the level of blockage. For example, blockage at the abdominal level (distal abdominal aorta) may result in buttock or hip pain. Lower leg arterial blockage may result in calf pain.  As PVD becomes more severe, pain can develop with less physical activity.  In people with severe PVD, leg pain may occur at rest.  Other PVD signs and symptoms:  Leg numbness or weakness.  Coldness in the affected leg or foot, especially when compared to the other leg.  A change in leg color.  Patients with significant PVD are more prone to ulcers or sores on toes, feet or legs. These may take longer to heal or may reoccur. The ulcers or sores can become infected.  If signs and symptoms of PVD are ignored, gangrene may occur. This can result in the loss of toes or loss of an entire limb.  Not all leg pain is related to PVD. Other medical conditions can cause leg pain such as:  Blood clots (embolism) or Deep Vein Thrombosis.  Inflammation of the blood vessels (vasculitis).  Spinal stenosis. DIAGNOSIS  Diagnosis of PVD can involve several different types of tests. These can  include:  Pulse Volume Recording Method (PVR). This test is simple, painless and does not involve the use of X-rays. PVR involves measuring and comparing the blood pressure in the arms and legs. An ABI (Ankle-Brachial Index) is calculated. The normal ratio of blood pressures is 1. As this number becomes smaller, it indicates more severe disease.  < 0.95 - indicates significant narrowing in one or more leg vessels.  <0.8 - there will usually be pain in the foot, leg or buttock with exercise.  <0.4 - will usually have pain in the legs at rest.  <0.25 - usually indicates limb threatening PVD.  Doppler detection of pulses in the legs. This test is painless and checks to see if you have a pulses in your legs/feet.  A dye or contrast material (a substance that highlights the blood vessels so they show up on x-ray) may be given to help your caregiver better see the arteries for the following tests. The dye is eliminated from your body by the kidney's. Your caregiver may order blood work to check your kidney function and other laboratory values before the following tests are performed:  Magnetic Resonance Angiography (MRA). An MRA is a picture study of the blood vessels   and arteries. The MRA machine uses a large magnet to produce images of the blood vessels.  Computed Tomography Angiography (CTA). A CTA is a specialized x-ray that looks at how the blood flows in your blood vessels. An IV may be inserted into your arm so contrast dye can be injected.  Angiogram. Is a procedure that uses x-rays to look at your blood vessels. This procedure is minimally invasive, meaning a small incision (cut) is made in your groin. A small tube (catheter) is then inserted into the artery of your groin. The catheter is guided to the blood vessel or artery your caregiver wants to examine. Contrast dye is injected into the catheter. X-rays are then taken of the blood vessel or artery. After the images are obtained, the  catheter is taken out. TREATMENT  Treatment of PVD involves many interventions which may include:  Lifestyle changes:  Quitting smoking.  Exercise.  Following a low fat, low cholesterol diet.  Control of diabetes.  Foot care is very important to the PVD patient. Good foot care can help prevent infection.  Medication:  Cholesterol-lowering medicine.  Blood pressure medicine.  Anti-platelet drugs.  Certain medicines may reduce symptoms of Intermittent Claudication.  Interventional/Surgical options:  Angioplasty. An Angioplasty is a procedure that inflates a balloon in the blocked artery. This opens the blocked artery to improve blood flow.  Stent Implant. A wire mesh tube (stent) is placed in the artery. The stent expands and stays in place, allowing the artery to remain open.  Peripheral Bypass Surgery. This is a surgical procedure that reroutes the blood around a blocked artery to help improve blood flow. This type of procedure may be performed if Angioplasty or stent implants are not an option. SEEK IMMEDIATE MEDICAL CARE IF:   You develop pain or numbness in your arms or legs.  Your arm or leg turns cold, becomes blue in color.  You develop redness, warmth, swelling and pain in your arms or legs. MAKE SURE YOU:   Understand these instructions.  Will watch your condition.  Will get help right away if you are not doing well or get worse. Document Released: 07/20/2004 Document Revised: 09/04/2011 Document Reviewed: 06/16/2008 [employer] Patient Information 2015 [employer]. This information is not intended to replace advice given to you by your health care provider. Make sure you discuss any questions you have with your health care provider.   Smoking Cessation Quitting smoking is important to your health and has many advantages. However, it is not always easy to quit since nicotine is a very addictive drug. Oftentimes, people try 3 times or more before being able  to quit. This document explains the best ways for you to prepare to quit smoking. Quitting takes hard work and a lot of effort, but you can do it. ADVANTAGES OF QUITTING SMOKING  You will live longer, feel better, and live better.  Your body will feel the impact of quitting smoking almost immediately.  Within 20 minutes, blood pressure decreases. Your pulse returns to its normal level.  After 8 hours, carbon monoxide levels in the blood return to normal. Your oxygen level increases.  After 24 hours, the chance of having a heart attack starts to decrease. Your breath, hair, and body stop smelling like smoke.  After 48 hours, damaged nerve endings begin to recover. Your sense of taste and smell improve.  After 72 hours, the body is virtually free of nicotine. Your bronchial tubes relax and breathing becomes easier.  After 2 to   12 weeks, lungs can hold more air. Exercise becomes easier and circulation improves.  The risk of having a heart attack, stroke, cancer, or lung disease is greatly reduced.  After 1 year, the risk of coronary heart disease is cut in half.  After 5 years, the risk of stroke falls to the same as a nonsmoker.  After 10 years, the risk of lung cancer is cut in half and the risk of other cancers decreases significantly.  After 15 years, the risk of coronary heart disease drops, usually to the level of a nonsmoker.  If you are pregnant, quitting smoking will improve your chances of having a healthy baby.  The people you live with, especially any children, will be healthier.  You will have extra money to spend on things other than cigarettes. QUESTIONS TO THINK ABOUT BEFORE ATTEMPTING TO QUIT You may want to talk about your answers with your health care provider.  Why do you want to quit?  If you tried to quit in the past, what helped and what did not?  What will be the most difficult situations for you after you quit? How will you plan to handle them?  Who  can help you through the tough times? Your family? Friends? A health care provider?  What pleasures do you get from smoking? What ways can you still get pleasure if you quit? Here are some questions to ask your health care provider:  How can you help me to be successful at quitting?  What medicine do you think would be best for me and how should I take it?  What should I do if I need more help?  What is smoking withdrawal like? How can I get information on withdrawal? GET READY  Set a quit date.  Change your environment by getting rid of all cigarettes, ashtrays, matches, and lighters in your home, car, or work. Do not let people smoke in your home.  Review your past attempts to quit. Think about what worked and what did not. GET SUPPORT AND ENCOURAGEMENT You have a better chance of being successful if you have help. You can get support in many ways.  Tell your family, friends, and coworkers that you are going to quit and need their support. Ask them not to smoke around you.  Get individual, group, or telephone counseling and support. Programs are available at local hospitals and health centers. Call your local health department for information about programs in your area.  Spiritual beliefs and practices may help some smokers quit.  Download a "quit meter" on your computer to keep track of quit statistics, such as how long you have gone without smoking, cigarettes not smoked, and money saved.  Get a self-help book about quitting smoking and staying off tobacco. LEARN NEW SKILLS AND BEHAVIORS  Distract yourself from urges to smoke. Talk to someone, go for a walk, or occupy your time with a task.  Change your normal routine. Take a different route to work. Drink tea instead of coffee. Eat breakfast in a different place.  Reduce your stress. Take a hot bath, exercise, or read a book.  Plan something enjoyable to do every day. Reward yourself for not smoking.  Explore  interactive web-based programs that specialize in helping you quit. GET MEDICINE AND USE IT CORRECTLY Medicines can help you stop smoking and decrease the urge to smoke. Combining medicine with the above behavioral methods and support can greatly increase your chances of successfully quitting smoking.  Nicotine replacement   therapy helps deliver nicotine to your body without the negative effects and risks of smoking. Nicotine replacement therapy includes nicotine gum, lozenges, inhalers, nasal sprays, and skin patches. Some may be available over-the-counter and others require a prescription.  Antidepressant medicine helps people abstain from smoking, but how this works is unknown. This medicine is available by prescription.  Nicotinic receptor partial agonist medicine simulates the effect of nicotine in your brain. This medicine is available by prescription. Ask your health care provider for advice about which medicines to use and how to use them based on your health history. Your health care provider will tell you what side effects to look out for if you choose to be on a medicine or therapy. Carefully read the information on the package. Do not use any other product containing nicotine while using a nicotine replacement product.  RELAPSE OR DIFFICULT SITUATIONS Most relapses occur within the first 3 months after quitting. Do not be discouraged if you start smoking again. Remember, most people try several times before finally quitting. You may have symptoms of withdrawal because your body is used to nicotine. You may crave cigarettes, be irritable, feel very hungry, cough often, get headaches, or have difficulty concentrating. The withdrawal symptoms are only temporary. They are strongest when you first quit, but they will go away within 10-14 days. To reduce the chances of relapse, try to:  Avoid drinking alcohol. Drinking lowers your chances of successfully quitting.  Reduce the amount of caffeine  you consume. Once you quit smoking, the amount of caffeine in your body increases and can give you symptoms, such as a rapid heartbeat, sweating, and anxiety.  Avoid smokers because they can make you want to smoke.  Do not let weight gain distract you. Many smokers will gain weight when they quit, usually less than 10 pounds. Eat a healthy diet and stay active. You can always lose the weight gained after you quit.  Find ways to improve your mood other than smoking. FOR MORE INFORMATION  www.smokefree.gov  Document Released: 06/06/2001 Document Revised: 10/27/2013 Document Reviewed: 09/21/2011 [employer] Patient Information 2015 [employer]. This information is not intended to replace advice given to you by your health care provider. Make sure you discuss any questions you have with your health care provider.  

## 2015-03-12 NOTE — Progress Notes (Signed)
Filed Vitals:   03/12/15 1609 03/12/15 1611 03/12/15 1612  BP: 155/80 151/82 145/83  Pulse: 59 62 62  Temp: 97.4 F (36.3 C)    Resp: 16    Height: 5\' 4"  (1.626 m)    Weight: 164 lb (74.39 kg)    SpO2: 99%

## 2015-03-12 NOTE — Progress Notes (Signed)
[facility] [place] HISTORY AND PHYSICAL   MRN : [id number]  History of Present Illness:   Bobby Vega is a 80 y.o. male patient of Dr. Trula Slade who is s/p aortobifemoral bypass graft by Dr. Trula Slade in January of 2013. In November, 2014, he had a left brain stroke associated with right upper extremity and right lower extremity weakness. The right lower extremity weakness resolved. He has persistent right upper extremity weakness and pain. He underwent an extensive workup at [facility] and was found to have significant intracranial disease on MRA. There was no significant extracranial carotid disease. Of note aspirin and Plavix were started on that admission. Of note, the patient had developed some left upper extremity weakness after his aortobifemoral bypass graft. Dr. Nicole Cella office note from December 2014 indicates he had no significant extracranial carotid disease. Dr. Scot Dock did not think that a cerebral arteriogram was indicated at that point given that he has known diffuse intracranial disease and therefore this is likely the cause of his recent stroke. Given that the disease was fairly diffuse again Dr. Scot Dock did not recommend cerebral arteriography and consideration for intracranial angioplasty or stenting at that point. Dr. Scot Dock advised that pt continue his aspirin and Plavix and risk factor management by his primary care physician. Pt walks about 100 feet when he develops claudication pain and weakness in both calves and feet, relieved by rest, pt denies non healing wounds. Pt denies any further stroke or TIA symptoms since November, 2014.  Pt Diabetic: No Pt smoker: smoker (1-2 cigars/day, stopped cigarettes many years ago) Unclear if he takes ASA,  he takes daily Plavix. He also takes a daily statin.   Current Outpatient Prescriptions  Medication Sig Dispense Refill  . amLODipine (NORVASC) 10 MG tablet Take 1 tablet (10 mg total) by mouth daily. 30 tablet  4  . aspirin 81 MG EC tablet Take 1 tablet (81 mg total) by mouth daily. For 3 months with plavix. 30 tablet 3  . atorvastatin (LIPITOR) 20 MG tablet Take 1 tablet (20 mg total) by mouth daily. 30 tablet 4  . clopidogrel (PLAVIX) 75 MG tablet Take 1 tablet (75 mg total) by mouth daily with breakfast. 30 tablet 4  . esomeprazole (NEXIUM) 40 MG capsule Take 40 mg by mouth daily before breakfast.     . furosemide (LASIX) 20 MG tablet daily.    . hydrALAZINE (APRESOLINE) 25 MG tablet Take 1 tablet (25 mg total) by mouth 2 (two) times daily. 60 tablet 3  . HYDROcodone-acetaminophen (NORCO/VICODIN) 5-325 MG per tablet Take 1 tablet by mouth daily as needed (pain).     . pregabalin (LYRICA) 75 MG capsule Take 75 mg by mouth 3 (three) times daily.     . Vitamin D, Ergocalciferol, (DRISDOL) 50000 UNITS CAPS capsule Take 50,000 Units by mouth every 7 (seven) days. Take on Friday or Saturday    . ciprofloxacin (CIPRO) 500 MG tablet Take 1 tablet (500 mg total) by mouth 2 (two) times daily. X 6days (Patient not taking: Reported on 06/10/2014) 12 tablet 0   No current facility-administered medications for this visit.    Past Medical History  Diagnosis Date  . Hyperlipidemia     takes Zocor nightly  . BPH (benign prostatic hyperplasia)     takes Avodart daily  . Constipation     Metamucil prn  . Borderline diabetes   . Pain in joint, hand   . ED (erectile dysfunction)     takes Cialis as  needed as well as Viagra  . GERD (gastroesophageal reflux disease)     takes Nexium daily  . Hypertension     takes Losartan daily  . Peripheral vascular disease   . Embolism - blood clot     in stomach  . Bronchitis     yrs ago  . Arthritis     hip  . Lumbar spondylosis with myelopathy   . Cervical spondylosis without myelopathy   . Cervical spondylosis with myelopathy   . Lumbar spondylosis with myelopathy   . Chronic back pain   . Diverticulosis   . Urinary urgency   . Nocturia   . Stroke      Social History Social History  Substance Use Topics  . Smoking status: Current Every Day Smoker -- .5 years    Last Attempt to Quit: 04/29/1992  . Smokeless tobacco: Never Used  . Alcohol Use: No    Family History Family History  Problem Relation Age of Onset  . Anesthesia problems Neg Hx   . Hypotension Neg Hx   . Malignant hyperthermia Neg Hx   . Pseudochol deficiency Neg Hx   . Heart disease Mother     Before age 33  . Heart disease Brother     NOT  before age 63  . Heart attack Brother     Surgical History Past Surgical History  Procedure Laterality Date  . Skin graft  1983  . Aorta - bilateral femoral artery bypass graft  07/13/2011    Procedure: AORTA BIFEMORAL BYPASS GRAFT;  Surgeon: Theotis Burrow, MD;  Location: [facility];  Service: Vascular;  Laterality: Bilateral;  . Cystoscopy  07/13/2011    Procedure: CYSTOSCOPY;  Surgeon: Hanley Ben, MD;  Location: [place];  Service: Urology;  Laterality: N/A;  cystoscopy,dilatation & insertion of councill foley catheter  . Abdominal aortagram N/A 07/04/2011    Procedure: ABDOMINAL Maxcine Ham;  Surgeon: Serafina Mitchell, MD;  Location: [facility] CATH LAB;  Service: Cardiovascular;  Laterality: N/A;  . Lower extremity angiogram Bilateral 07/04/2011    Procedure: LOWER EXTREMITY ANGIOGRAM;  Surgeon: Serafina Mitchell, MD;  Location: [facility] CATH LAB;  Service: Cardiovascular;  Laterality: Bilateral;    No Known Allergies  Current Outpatient Prescriptions  Medication Sig Dispense Refill  . amLODipine (NORVASC) 10 MG tablet Take 1 tablet (10 mg total) by mouth daily. 30 tablet 4  . aspirin 81 MG EC tablet Take 1 tablet (81 mg total) by mouth daily. For 3 months with plavix. 30 tablet 3  . atorvastatin (LIPITOR) 20 MG tablet Take 1 tablet (20 mg total) by mouth daily. 30 tablet 4  . clopidogrel (PLAVIX) 75 MG tablet Take 1 tablet (75 mg total) by mouth daily with breakfast. 30 tablet 4  . esomeprazole (NEXIUM) 40 MG capsule Take 40 mg by mouth  daily before breakfast.     . furosemide (LASIX) 20 MG tablet daily.    . hydrALAZINE (APRESOLINE) 25 MG tablet Take 1 tablet (25 mg total) by mouth 2 (two) times daily. 60 tablet 3  . HYDROcodone-acetaminophen (NORCO/VICODIN) 5-325 MG per tablet Take 1 tablet by mouth daily as needed (pain).     . pregabalin (LYRICA) 75 MG capsule Take 75 mg by mouth 3 (three) times daily.     . Vitamin D, Ergocalciferol, (DRISDOL) 50000 UNITS CAPS capsule Take 50,000 Units by mouth every 7 (seven) days. Take on Friday or Saturday    . ciprofloxacin (CIPRO) 500 MG tablet Take 1 tablet (500 mg  total) by mouth 2 (two) times daily. X 6days (Patient not taking: Reported on 06/10/2014) 12 tablet 0   No current facility-administered medications for this visit.     REVIEW OF SYSTEMS: See HPI for pertinent positives and negatives.  Physical Examination Filed Vitals:   03/12/15 1609 03/12/15 1611 03/12/15 1612  BP: 155/80 151/82 145/83  Pulse: 59 62 62  Temp: 97.4 F (36.3 C)    Resp: 16    Height: 5\' 4"  (1.626 m)    Weight: 164 lb (74.39 kg)    SpO2: 99%     Body mass index is 28.14 kg/(m^2).  General: WDWN in NAD Gait: Normal HENT: WNL Eyes: Pupils equal, bilateral arcus senilis.  Pulmonary: normal non-labored breathing , without Rales, rhonchi, wheezing Cardiac: RRR, no murmur detected  Abdomen: soft, NT, no palpable masses Skin: no rashes, no ulcers; no Gangrene , no cellulitis; no open wounds.   VASCULAR EXAM  Carotid Bruits Left Right   Negative Negative  Aorta is not palpable Radial pulses are 2+ palpable and =   VASCULAR EXAM: Extremities without ischemic changes  without Gangrene; without open wounds.     LE Pulses LEFT RIGHT   FEMORAL 2+ palpable 2+ palpable    POPLITEAL  not palpable  not palpable   POSTERIOR TIBIAL not palpable  not palpable    DORSALIS PEDIS  ANTERIOR TIBIAL not palpable  not palpable     Musculoskeletal: no muscle wasting or atrophy; no peripheral edema Neurologic: A&O X 3; Appropriate Affect ;  SENSATION: normal; MOTOR FUNCTION: 5/5 Symmetric, CN 2-12 intact Speech is fluent/normal          Non-Invasive Vascular Imaging (03/05/15):  CEREBROVASCULAR DUPLEX EVALUATION    Carotid artery disease     PREVIOUS INTERVENTION(S):         RIGHT  LEFT  End Diastolic Velocities (cm/s) Plaque LOCATION Peak Systolic Velocities (cm/s) End Diastolic Velocities (cm/s) Plaque  15  CCA PROXIMAL 173 28 HM  24 HM CCA MID 145 26 HM  15 HT CCA DISTAL 119 19   6  ECA 58 4   8  ICA PROXIMAL 23 8   34  ICA MID 76 29   19  ICA DISTAL 66 25     0.82 ICA / CCA Ratio (PSV) 0.52  Antegrade  Vertebral Flow Not Visualized   123456 Brachial Systolic Pressure (mmHg) 0000000  Multiphasic (Subclavian artery) Brachial Artery Waveforms Multiphasic (Subclavian artery)    Plaque Morphology:  HM = Homogeneous, HT = Heterogeneous, CP = Calcific Plaque, SP = Smooth Plaque, IP = Irregular Plaque     ADDITIONAL FINDINGS:     Bilateral internal carotid artery velocities suggest a <40% stenosis.     Compared to the previous exam:  No significant change in comparison to the last exam.     ABI (Date: 03/05/15) R: 0.53 (0.60, 06/10/14), DP: monophasic, PT: monophasic, TBI: 0.45 L: 0.67 (0.67), DP: monophasic, PT: monophasic, TBI: 0.62    ASSESSMENT:  Bobby Vega is a 80 y.o. male who is s/p aortobifemoral bypass graft by Dr. Trula Slade in January of 2013. In November, 2014, he had a left brain stroke associated with right upper extremity and right lower extremity weakness. The right lower extremity weakness resolved. He has persistent right upper extremity weakness and pain. He underwent an extensive workup at [facility] and was  found to have significant intracranial disease on MRA. There was no significant extracranial carotid disease. Of note aspirin and Plavix were started  on that admission. Of note, the patient had developed some left upper extremity weakness after his aortobifemoral bypass graft. Dr. Nicole Cella office note from December 2014 indicates he had no significant extracranial carotid disease. Dr. Scot Dock did not think that a cerebral arteriogram was indicated at that point given that he has known diffuse intracranial disease and therefore this is likely the cause of his recent stroke. Given that the disease was fairly diffuse again Dr. Scot Dock did not recommend cerebral arteriography and consideration for intracranial angioplasty or stenting at that point. Dr. Scot Dock advised that pt continue his aspirin and Plavix and risk factor management by his primary care physician. Pt walks about 100 feet when he develops claudication pain and weakness in both calves and feet, relieved by rest, pt denies non healing wounds. Pt denies any further stroke or TIA symptoms since November, 2014.  ABI's remains stable with moderate bilateral arterial occlusive disease. Carotid duplex suggests minimal bilateral extracranial internal carotid artery stenosis.  Pt admits to smoking 1-2 cigars/day, see Plan.  PLAN:   The patient was counseled re smoking cessation and given several free resources re smoking cessation.  Graduated walking program in a safe environment discussed.   Based on today's exam and non-invasive vascular lab results, the patient will follow up in 1 year with the following tests: ABI's and carotid Duplex. He knows to return sooner if needed. I discussed in depth with the patient the nature of atherosclerosis, and emphasized the importance of maximal medical management including strict control of blood pressure, blood glucose, and lipid levels, obtaining regular exercise, and cessation of smoking.  The patient  is aware that without maximal medical management the underlying atherosclerotic disease process will progress, limiting the benefit of any interventions.  The patient was given information about stroke prevention and what symptoms should prompt the patient to seek immediate medical care.  The patient was given information about PAD including signs, symptoms, treatment, what symptoms should prompt the patient to seek immediate medical care, and risk reduction measures to take. Thank you for allowing [place] to participate in this patient's care.  Clemon Chambers, RN, MSN, FNP-C [facility] Office: [phone]  Clinic MD: Bridgett Larsson  03/12/2015 4:32 PM

## 2015-03-15 NOTE — Addendum Note (Signed)
Addended by: Dorthula Rue L on: 03/15/2015 09:24 AM   Modules accepted: Orders

## 2016-03-09 ENCOUNTER — Encounter: Payer: Self-pay | Admitting: Family

## 2016-03-13 ENCOUNTER — Ambulatory Visit: Payer: Medicare HMO | Admitting: Family

## 2016-03-13 ENCOUNTER — Ambulatory Visit (HOSPITAL_COMMUNITY): Payer: Medicare HMO

## 2016-03-22 ENCOUNTER — Encounter: Payer: Self-pay | Admitting: Family

## 2016-03-28 ENCOUNTER — Ambulatory Visit (HOSPITAL_COMMUNITY)
Admission: RE | Admit: 2016-03-28 | Discharge: 2016-03-28 | Disposition: A | Discharge status: Home / Self Care | Payer: Medicare HMO | Source: Ambulatory Visit | Attending: Family | Admitting: Family

## 2016-03-28 DIAGNOSIS — F17290 Nicotine dependence, other tobacco product, uncomplicated: Secondary | ICD-10-CM

## 2016-03-28 DIAGNOSIS — Z95828 Presence of other vascular implants and grafts: Secondary | ICD-10-CM | POA: Diagnosis not present

## 2016-03-28 DIAGNOSIS — I10 Essential (primary) hypertension: Secondary | ICD-10-CM | POA: Diagnosis not present

## 2016-03-28 DIAGNOSIS — E785 Hyperlipidemia, unspecified: Secondary | ICD-10-CM | POA: Insufficient documentation

## 2016-03-28 DIAGNOSIS — Z8673 Personal history of transient ischemic attack (TIA), and cerebral infarction without residual deficits: Secondary | ICD-10-CM | POA: Diagnosis not present

## 2016-03-28 DIAGNOSIS — Z48812 Encounter for surgical aftercare following surgery on the circulatory system: Secondary | ICD-10-CM

## 2016-03-28 DIAGNOSIS — I6523 Occlusion and stenosis of bilateral carotid arteries: Secondary | ICD-10-CM

## 2016-03-28 LAB — VAS US CAROTID
LCCAPDIAS: 20 cm/s
LEFT ECA DIAS: -7 cm/s
LEFT VERTEBRAL DIAS: 5 cm/s
LICAPDIAS: -22 cm/s
LICAPSYS: -60 cm/s
Left CCA dist dias: -25 cm/s
Left CCA dist sys: -145 cm/s
Left CCA prox sys: 103 cm/s
Left ICA dist dias: -24 cm/s
Left ICA dist sys: -63 cm/s
RIGHT CCA MID DIAS: -16 cm/s
RIGHT ECA DIAS: -4 cm/s
RIGHT VERTEBRAL DIAS: 26 cm/s
Right CCA prox dias: 13 cm/s
Right CCA prox sys: 68 cm/s
Right cca dist sys: -62 cm/s

## 2016-03-29 ENCOUNTER — Ambulatory Visit: Payer: Medicare HMO | Admitting: Family

## 2016-04-03 ENCOUNTER — Encounter: Payer: Self-pay | Admitting: Family

## 2016-04-04 ENCOUNTER — Encounter: Payer: Self-pay | Admitting: Family

## 2016-04-04 ENCOUNTER — Ambulatory Visit (INDEPENDENT_AMBULATORY_CARE_PROVIDER_SITE_OTHER): Payer: Medicare HMO | Admitting: Family

## 2016-04-04 VITALS — BP 134/74 | HR 80 | Temp 97.8°F | Resp 16 | Ht 65.0 in | Wt 155.0 lb

## 2016-04-04 DIAGNOSIS — I6523 Occlusion and stenosis of bilateral carotid arteries: Secondary | ICD-10-CM | POA: Diagnosis not present

## 2016-04-04 DIAGNOSIS — F17290 Nicotine dependence, other tobacco product, uncomplicated: Secondary | ICD-10-CM | POA: Diagnosis not present

## 2016-04-04 DIAGNOSIS — I7409 Other arterial embolism and thrombosis of abdominal aorta: Secondary | ICD-10-CM

## 2016-04-04 DIAGNOSIS — Z8673 Personal history of transient ischemic attack (TIA), and cerebral infarction without residual deficits: Secondary | ICD-10-CM

## 2016-04-04 NOTE — Patient Instructions (Signed)
Stroke Prevention Some medical conditions and behaviors are associated with an increased chance of having a stroke. You may prevent a stroke by making healthy choices and managing medical conditions. HOW CAN I REDUCE MY RISK OF HAVING A STROKE?   Stay physically active. Get at least 30 minutes of activity on most or all days.  Do not smoke. It may also be helpful to avoid exposure to secondhand smoke.  Limit alcohol use. Moderate alcohol use is considered to be:  No more than 2 drinks per day for men.  No more than 1 drink per day for nonpregnant women.  Eat healthy foods. This involves:  Eating 5 or more servings of fruits and vegetables a day.  Making dietary changes that address high blood pressure (hypertension), high cholesterol, diabetes, or obesity.  Manage your cholesterol levels.  Making food choices that are high in fiber and low in saturated fat, trans fat, and cholesterol may control cholesterol levels.  Take any prescribed medicines to control cholesterol as directed by your health care provider.  Manage your diabetes.  Controlling your carbohydrate and sugar intake is recommended to manage diabetes.  Take any prescribed medicines to control diabetes as directed by your health care provider.  Control your hypertension.  Making food choices that are low in salt (sodium), saturated fat, trans fat, and cholesterol is recommended to manage hypertension.  Ask your health care provider if you need treatment to lower your blood pressure. Take any prescribed medicines to control hypertension as directed by your health care provider.  If you are 18-39 years of age, have your blood pressure checked every 3-5 years. If you are 40 years of age or older, have your blood pressure checked every year.  Maintain a healthy weight.  Reducing calorie intake and making food choices that are low in sodium, saturated fat, trans fat, and cholesterol are recommended to manage  weight.  Stop drug abuse.  Avoid taking birth control pills.  Talk to your health care provider about the risks of taking birth control pills if you are over 35 years old, smoke, get migraines, or have ever had a blood clot.  Get evaluated for sleep disorders (sleep apnea).  Talk to your health care provider about getting a sleep evaluation if you snore a lot or have excessive sleepiness.  Take medicines only as directed by your health care provider.  For some people, aspirin or blood thinners (anticoagulants) are helpful in reducing the risk of forming abnormal blood clots that can lead to stroke. If you have the irregular heart rhythm of atrial fibrillation, you should be on a blood thinner unless there is a good reason you cannot take them.  Understand all your medicine instructions.  Make sure that other conditions (such as anemia or atherosclerosis) are addressed. SEEK IMMEDIATE MEDICAL CARE IF:   You have sudden weakness or numbness of the face, arm, or leg, especially on one side of the body.  Your face or eyelid droops to one side.  You have sudden confusion.  You have trouble speaking (aphasia) or understanding.  You have sudden trouble seeing in one or both eyes.  You have sudden trouble walking.  You have dizziness.  You have a loss of balance or coordination.  You have a sudden, severe headache with no known cause.  You have new chest pain or an irregular heartbeat. Any of these symptoms may represent a serious problem that is an emergency. Do not wait to see if the symptoms will   go away. Get medical help at once. Call your local emergency services (911 in [place] Do not drive yourself to the hospital.   This information is not intended to replace advice given to you by your health care provider. Make sure you discuss any questions you have with your health care provider.   Document Released: 07/20/2004 Document Revised: 07/03/2014 Document Reviewed:  12/13/2012 Elsevier Interactive Patient Education 2016 [employer]    Peripheral Vascular Disease Peripheral vascular disease (PVD) is a disease of the blood vessels that are not part of your heart and brain. A simple term for PVD is poor circulation. In most cases, PVD narrows the blood vessels that carry blood from your heart to the rest of your body. This can result in a decreased supply of blood to your arms, legs, and internal organs, like your stomach or kidneys. However, it most often affects a person's lower legs and feet. There are two types of PVD.  Organic PVD. This is the more common type. It is caused by damage to the structure of blood vessels.  Functional PVD. This is caused by conditions that make blood vessels contract and tighten (spasm). Without treatment, PVD tends to get worse over time. PVD can also lead to acute ischemic limb. This is when an arm or limb suddenly has trouble getting enough blood. This is a medical emergency. CAUSES Each type of PVD has many different causes. The most common cause of PVD is buildup of a fatty material (plaque) inside of your arteries (atherosclerosis). Small amounts of plaque can break off from the walls of the blood vessels and become lodged in a smaller artery. This blocks blood flow and can cause acute ischemic limb. Other common causes of PVD include:  Blood clots that form inside of blood vessels.  Injuries to blood vessels.  Diseases that cause inflammation of blood vessels or cause blood vessel spasms.  Health behaviors and health history that increase your risk of developing PVD. RISK FACTORS  You may have a greater risk of PVD if you:  Have a family history of PVD.  Have certain medical conditions, including:  High cholesterol.  Diabetes.  High blood pressure (hypertension).  Coronary heart disease.  Past problems with blood clots.  Past injury, such as burns or a broken bone. These may have damaged blood  vessels in your limbs.  Buerger disease. This is caused by inflamed blood vessels in your hands and feet.  Some forms of arthritis.  Rare birth defects that affect the arteries in your legs.  Use tobacco.  Do not get enough exercise.  Are obese.  Are age 50 or older. SIGNS AND SYMPTOMS  PVD may cause many different symptoms. Your symptoms depend on what part of your body is not getting enough blood. Some common signs and symptoms include:  Cramps in your lower legs. This may be a symptom of poor leg circulation (claudication).  Pain and weakness in your legs while you are physically active that goes away when you rest (intermittent claudication).  Leg pain when at rest.  Leg numbness, tingling, or weakness.  Coldness in a leg or foot, especially when compared with the other leg.  Skin or hair changes. These can include:  Hair loss.  Shiny skin.  Pale or bluish skin.  Thick toenails.  Inability to get or maintain an erection (erectile dysfunction). People with PVD are more prone to developing ulcers and sores on their toes, feet, or legs. These may take longer than   normal to heal. DIAGNOSIS Your health care provider may diagnose PVD from your signs and symptoms. The health care provider will also do a physical exam. You may have tests to find out what is causing your PVD and determine its severity. Tests may include:  Blood pressure recordings from your arms and legs and measurements of the strength of your pulses (pulse volume recordings).  Imaging studies using sound waves to take pictures of the blood flow through your blood vessels (Doppler ultrasound).  Injecting a dye into your blood vessels before having imaging studies using:  X-rays (angiogram or arteriogram).  Computer-generated X-rays (CT angiogram).  A powerful electromagnetic field and a computer (magnetic resonance angiogram or MRA). TREATMENT Treatment for PVD depends on the cause of your condition  and the severity of your symptoms. It also depends on your age. Underlying causes need to be treated and controlled. These include long-lasting (chronic) conditions, such as diabetes, high cholesterol, and high blood pressure. You may need to first try making lifestyle changes and taking medicines. Surgery may be needed if these do not work. Lifestyle changes may include:  Quitting smoking.  Exercising regularly.  Following a low-fat, low-cholesterol diet. Medicines may include:  Blood thinners to prevent blood clots.  Medicines to improve blood flow.  Medicines to improve your blood cholesterol levels. Surgical procedures may include:  A procedure that uses an inflated balloon to open a blocked artery and improve blood flow (angioplasty).  A procedure to put in a tube (stent) to keep a blocked artery open (stent implant).  Surgery to reroute blood flow around a blocked artery (peripheral bypass surgery).  Surgery to remove dead tissue from an infected wound on the affected limb.  Amputation. This is surgical removal of the affected limb. This may be necessary in cases of acute ischemic limb that are not improved through medical or surgical treatments. HOME CARE INSTRUCTIONS  Take medicines only as directed by your health care provider.  Do not use any tobacco products, including cigarettes, chewing tobacco, or electronic cigarettes. If you need help quitting, ask your health care provider.  Lose weight if you are overweight, and maintain a healthy weight as directed by your health care provider.  Eat a diet that is low in fat and cholesterol. If you need help, ask your health care provider.  Exercise regularly. Ask your health care provider to suggest some good activities for you.  Use compression stockings or other mechanical devices as directed by your health care provider.  Take good care of your feet.  Wear comfortable shoes that fit well.  Check your feet often for  any cuts or sores. SEEK MEDICAL CARE IF:  You have cramps in your legs while walking.  You have leg pain when you are at rest.  You have coldness in a leg or foot.  Your skin changes.  You have erectile dysfunction.  You have cuts or sores on your feet that are not healing. SEEK IMMEDIATE MEDICAL CARE IF:  Your arm or leg turns cold and blue.  Your arms or legs become red, warm, swollen, painful, or numb.  You have chest pain or trouble breathing.  You suddenly have weakness in your face, arm, or leg.  You become very confused or lose the ability to speak.  You suddenly have a very bad headache or lose your vision.   This information is not intended to replace advice given to you by your health care provider. Make sure you discuss any questions   you have with your health care provider.   Document Released: 07/20/2004 Document Revised: 07/03/2014 Document Reviewed: 11/20/2013 Elsevier Interactive Patient Education 2016 [employer]     Steps to Quit Smoking  Smoking tobacco can be harmful to your health and can affect almost every organ in your body. Smoking puts you, and those around you, at risk for developing many serious chronic diseases. Quitting smoking is difficult, but it is one of the best things that you can do for your health. It is never too late to quit. WHAT ARE THE BENEFITS OF QUITTING SMOKING? When you quit smoking, you lower your risk of developing serious diseases and conditions, such as:  Lung cancer or lung disease, such as COPD.  Heart disease.  Stroke.  Heart attack.  Infertility.  Osteoporosis and bone fractures. Additionally, symptoms such as coughing, wheezing, and shortness of breath may get better when you quit. You may also find that you get sick less often because your body is stronger at fighting off colds and infections. If you are pregnant, quitting smoking can help to reduce your chances of having a baby of low birth weight. HOW DO  I GET READY TO QUIT? When you decide to quit smoking, create a plan to make sure that you are successful. Before you quit:  Pick a date to quit. Set a date within the next two weeks to give you time to prepare.  Write down the reasons why you are quitting. Keep this list in places where you will see it often, such as on your bathroom mirror or in your car or wallet.  Identify the people, places, things, and activities that make you want to smoke (triggers) and avoid them. Make sure to take these actions:  Throw away all cigarettes at home, at work, and in your car.  Throw away smoking accessories, such as ashtrays and lighters.  Clean your car and make sure to empty the ashtray.  Clean your home, including curtains and carpets.  Tell your family, friends, and coworkers that you are quitting. Support from your loved ones can make quitting easier.  Talk with your health care provider about your options for quitting smoking.  Find out what treatment options are covered by your health insurance. WHAT STRATEGIES CAN I USE TO QUIT SMOKING?  Talk with your healthcare provider about different strategies to quit smoking. Some strategies include:  Quitting smoking altogether instead of gradually lessening how much you smoke over a period of time. Research shows that quitting "cold turkey" is more successful than gradually quitting.  Attending in-person counseling to help you build problem-solving skills. You are more likely to have success in quitting if you attend several counseling sessions. Even short sessions of 10 minutes can be effective.  Finding resources and support systems that can help you to quit smoking and remain smoke-free after you quit. These resources are most helpful when you use them often. They can include:  Online chats with a counselor.  Telephone quitlines.  Printed self-help materials.  Support groups or group counseling.  Text messaging programs.  Mobile phone  applications.  Taking medicines to help you quit smoking. (If you are pregnant or breastfeeding, talk with your health care provider first.) Some medicines contain nicotine and some do not. Both types of medicines help with cravings, but the medicines that include nicotine help to relieve withdrawal symptoms. Your health care provider may recommend:  Nicotine patches, gum, or lozenges.  Nicotine inhalers or sprays.  Non-nicotine medicine that   is taken by mouth. Talk with your health care provider about combining strategies, such as taking medicines while you are also receiving in-person counseling. Using these two strategies together makes you more likely to succeed in quitting than if you used either strategy on its own. If you are pregnant or breastfeeding, talk with your health care provider about finding counseling or other support strategies to quit smoking. Do not take medicine to help you quit smoking unless told to do so by your health care provider. WHAT THINGS CAN I DO TO MAKE IT EASIER TO QUIT? Quitting smoking might feel overwhelming at first, but there is a lot that you can do to make it easier. Take these important actions:  Reach out to your family and friends and ask that they support and encourage you during this time. Call telephone quitlines, reach out to support groups, or work with a counselor for support.  Ask people who smoke to avoid smoking around you.  Avoid places that trigger you to smoke, such as bars, parties, or smoke-break areas at work.  Spend time around people who do not smoke.  Lessen stress in your life, because stress can be a smoking trigger for some people. To lessen stress, try:  Exercising regularly.  Deep-breathing exercises.  Yoga.  Meditating.  Performing a body scan. This involves closing your eyes, scanning your body from head to toe, and noticing which parts of your body are particularly tense. Purposefully relax the muscles in those  areas.  Download or purchase mobile phone or tablet apps (applications) that can help you stick to your quit plan by providing reminders, tips, and encouragement. There are many free apps, such as QuitGuide from the CDC (Centers for Disease Control and Prevention). You can find other support for quitting smoking (smoking cessation) through smokefree.gov and other websites. HOW WILL I FEEL WHEN I QUIT SMOKING? Within the first 24 hours of quitting smoking, you may start to feel some withdrawal symptoms. These symptoms are usually most noticeable 2-3 days after quitting, but they usually do not last beyond 2-3 weeks. Changes or symptoms that you might experience include:  Mood swings.  Restlessness, anxiety, or irritation.  Difficulty concentrating.  Dizziness.  Strong cravings for sugary foods in addition to nicotine.  Mild weight gain.  Constipation.  Nausea.  Coughing or a sore throat.  Changes in how your medicines work in your body.  A depressed mood.  Difficulty sleeping (insomnia). After the first 2-3 weeks of quitting, you may start to notice more positive results, such as:  Improved sense of smell and taste.  Decreased coughing and sore throat.  Slower heart rate.  Lower blood pressure.  Clearer skin.  The ability to breathe more easily.  Fewer sick days. Quitting smoking is very challenging for most people. Do not get discouraged if you are not successful the first time. Some people need to make many attempts to quit before they achieve long-term success. Do your best to stick to your quit plan, and talk with your health care provider if you have any questions or concerns.   This information is not intended to replace advice given to you by your health care provider. Make sure you discuss any questions you have with your health care provider.   Document Released: 06/06/2001 Document Revised: 10/27/2014 Document Reviewed: 10/27/2014 Elsevier Interactive Patient  Education 2016 [employer]  

## 2016-04-04 NOTE — Progress Notes (Signed)
[facility] [place] HISTORY AND PHYSICAL   MRN : [id number]  History of Present Illness:   Bobby Vega is a 81 y.o. male patient of Dr. Trula Slade who is s/p aortobifemoral bypass graft by Dr. Trula Slade in January of 2013. In November, 2014, he had a left brain stroke associated with right upper extremity and right lower extremity weakness. The right lower extremity weakness resolved. He has persistent right upper extremity weakness and pain. He underwent an extensive workup at [facility] and was found to have significant intracranial disease on MRA. There was no significant extracranial carotid disease.  Of note aspirin and Plavix were started on that admission. Of note, the patient had developed some left upper extremity weakness after his aortobifemoral bypass graft. Dr. Nicole Cella office note from December 2014 indicates he had no significant extracranial carotid disease. Dr. Scot Dock did not think that a cerebral arteriogram was indicated at that point given that he has known diffuse intracranial disease and therefore this is likely the cause of his recent stroke. Given that the disease was fairly diffuse again Dr. Scot Dock did not recommend cerebral arteriography and consideration for intracranial angioplasty or stenting at that point. Dr. Scot Dock advised that pt continue his aspirin and Plavix and risk factor management by his primary care physician.  Pt walks about 100 feet when he develops claudication pain and weakness in both calves and feet, relieved by rest, pt denies non healing wounds. Pt denies any further stroke or TIA symptoms since November, 2014.  Pt Diabetic: No Pt smoker: smoker (admits to 2 cigars/day, stopped cigarettes many years ago) Unclear if he takes ASA,  he takes daily Plavix. He also takes a daily statin.     Current Outpatient Prescriptions  Medication Sig Dispense Refill  . amLODipine (NORVASC) 10 MG tablet Take 1 tablet (10 mg total) by mouth  daily. 30 tablet 4  . atorvastatin (LIPITOR) 20 MG tablet Take 1 tablet (20 mg total) by mouth daily. 30 tablet 4  . COLCRYS 0.6 MG tablet     . esomeprazole (NEXIUM) 40 MG capsule Take 40 mg by mouth daily before breakfast.     . furosemide (LASIX) 20 MG tablet daily.    . hydrALAZINE (APRESOLINE) 25 MG tablet Take 1 tablet (25 mg total) by mouth 2 (two) times daily. 60 tablet 3  . HYDROcodone-acetaminophen (NORCO/VICODIN) 5-325 MG per tablet Take 1 tablet by mouth daily as needed (pain).     Marland Kitchen aspirin 81 MG EC tablet Take 1 tablet (81 mg total) by mouth daily. For 3 months with plavix. (Patient not taking: Reported on 04/04/2016) 30 tablet 3  . ciprofloxacin (CIPRO) 500 MG tablet Take 1 tablet (500 mg total) by mouth 2 (two) times daily. X 6days (Patient not taking: Reported on 04/04/2016) 12 tablet 0  . clopidogrel (PLAVIX) 75 MG tablet Take 1 tablet (75 mg total) by mouth daily with breakfast. (Patient not taking: Reported on 04/04/2016) 30 tablet 4  . pregabalin (LYRICA) 75 MG capsule Take 75 mg by mouth 3 (three) times daily.     . Vitamin D, Ergocalciferol, (DRISDOL) 50000 UNITS CAPS capsule Take 50,000 Units by mouth every 7 (seven) days. Take on Friday or Saturday     No current facility-administered medications for this visit.     Past Medical History:  Diagnosis Date  . Arthritis    hip  . Borderline diabetes   . BPH (benign prostatic hyperplasia)    takes Avodart daily  . Bronchitis  yrs ago  . Cervical spondylosis with myelopathy   . Cervical spondylosis without myelopathy   . Chronic back pain   . Constipation    Metamucil prn  . Diverticulosis   . ED (erectile dysfunction)    takes Cialis as needed as well as Viagra  . Embolism - blood clot    in stomach  . GERD (gastroesophageal reflux disease)    takes Nexium daily  . Hyperlipidemia    takes Zocor nightly  . Hypertension    takes Losartan daily  . Lumbar spondylosis with myelopathy   . Lumbar spondylosis  with myelopathy   . Nocturia   . Pain in joint, hand   . Peripheral vascular disease ([place])   . Stroke ([place])   . Urinary urgency     Social History Social History  Substance Use Topics  . Smoking status: Current Some Day Smoker    Years: 0.50    Types: Cigars    Last attempt to quit: 04/29/1992  . Smokeless tobacco: Never Used  . Alcohol use No    Family History Family History  Problem Relation Age of Onset  . Anesthesia problems Neg Hx   . Hypotension Neg Hx   . Malignant hyperthermia Neg Hx   . Pseudochol deficiency Neg Hx   . Heart disease Mother     Before age 60  . Heart disease Brother     NOT  before age 25  . Heart attack Brother     Surgical History Past Surgical History:  Procedure Laterality Date  . ABDOMINAL AORTAGRAM N/A 07/04/2011   Procedure: ABDOMINAL Maxcine Ham;  Surgeon: Serafina Mitchell, MD;  Location: [facility] CATH LAB;  Service: Cardiovascular;  Laterality: N/A;  . AORTA - BILATERAL FEMORAL ARTERY BYPASS GRAFT  07/13/2011   Procedure: AORTA BIFEMORAL BYPASS GRAFT;  Surgeon: Theotis Burrow, MD;  Location: [place];  Service: Vascular;  Laterality: Bilateral;  . CYSTOSCOPY  07/13/2011   Procedure: CYSTOSCOPY;  Surgeon: Hanley Ben, MD;  Location: [place];  Service: Urology;  Laterality: N/A;  cystoscopy,dilatation & insertion of councill foley catheter  . LOWER EXTREMITY ANGIOGRAM Bilateral 07/04/2011   Procedure: LOWER EXTREMITY ANGIOGRAM;  Surgeon: Serafina Mitchell, MD;  Location[facility] CATH LAB;  Service: Cardiovascular;  Laterality: Bilateral;  . SKIN GRAFT  1983    No Known Allergies  Current Outpatient Prescriptions  Medication Sig Dispense Refill  . amLODipine (NORVASC) 10 MG tablet Take 1 tablet (10 mg total) by mouth daily. 30 tablet 4  . atorvastatin (LIPITOR) 20 MG tablet Take 1 tablet (20 mg total) by mouth daily. 30 tablet 4  . COLCRYS 0.6 MG tablet     . esomeprazole (NEXIUM) 40 MG capsule Take 40 mg by mouth daily before breakfast.     . furosemide  (LASIX) 20 MG tablet daily.    . hydrALAZINE (APRESOLINE) 25 MG tablet Take 1 tablet (25 mg total) by mouth 2 (two) times daily. 60 tablet 3  . HYDROcodone-acetaminophen (NORCO/VICODIN) 5-325 MG per tablet Take 1 tablet by mouth daily as needed (pain).     Marland Kitchen aspirin 81 MG EC tablet Take 1 tablet (81 mg total) by mouth daily. For 3 months with plavix. (Patient not taking: Reported on 04/04/2016) 30 tablet 3  . ciprofloxacin (CIPRO) 500 MG tablet Take 1 tablet (500 mg total) by mouth 2 (two) times daily. X 6days (Patient not taking: Reported on 04/04/2016) 12 tablet 0  . clopidogrel (PLAVIX) 75 MG tablet Take 1 tablet (75  mg total) by mouth daily with breakfast. (Patient not taking: Reported on 04/04/2016) 30 tablet 4  . pregabalin (LYRICA) 75 MG capsule Take 75 mg by mouth 3 (three) times daily.     . Vitamin D, Ergocalciferol, (DRISDOL) 50000 UNITS CAPS capsule Take 50,000 Units by mouth every 7 (seven) days. Take on Friday or Saturday     No current facility-administered medications for this visit.      REVIEW OF SYSTEMS: See HPI for pertinent positives and negatives.  Physical Examination Vitals:   04/04/16 1030 04/04/16 1033  BP: 140/73 134/74  Pulse: 80 80  Resp: 16   Temp: 97.8 F (36.6 C)   SpO2: 99%   Weight: 155 lb (70.3 kg)   Height: 5\' 5"  (1.651 m)    Body mass index is 25.79 kg/m.  General: WDWN in NAD Gait: Normal HENT: WNL Eyes: PERRLA, bilateral arcus senilis.  Pulmonary: normal non-labored breathing, CTAB, adequate air movement in all fields. Cardiac: RRR, no murmur detected  Abdomen: soft, NT, no palpable masses Skin: no rashes, no ulcers, no cellulitis.  VASCULAR EXAM  Carotid Bruits Left Right   Negative Negative  Aorta is not palpable Radial pulses are 2+ palpable and =   VASCULAR EXAM: Extremities without ischemic changes  without Gangrene; without open wounds.      LE Pulses LEFT RIGHT   FEMORAL 2+ palpable 2+ palpable    POPLITEAL not palpable  not palpable   POSTERIOR TIBIAL not palpable  not palpable    DORSALIS PEDIS  ANTERIOR TIBIAL not palpable  not palpable     Musculoskeletal: no muscle wasting or atrophy; no peripheral edema Neurologic: A&O X 3; Appropriate Affect ;  SENSATION: normal; MOTOR FUNCTION: 5/5 Symmetric, CN 2-12 intact Speech is fluent/normal    ASSESSMENT:  Bobby Vega is a 81 y.o. male who is s/p aortobifemoral bypass graft by Dr. Trula Slade in January of 2013. In November, 2014, he had a left brain stroke associated with right upper extremity and right lower extremity weakness. The right lower extremity weakness resolved. He has persistent right upper extremity weakness and pain. He underwent an extensive workup at [facility] and was found to have significant intracranial disease on MRA. There was no significant extracranial carotid disease.  Of note aspirin and Plavix were started on that admission. Of note, the patient had developed some left upper extremity weakness after his aortobifemoral bypass graft. Dr. Nicole Cella office note from December 2014 indicates he had no significant extracranial carotid disease. Dr. Scot Dock did not think that a cerebral arteriogram was indicated at that point given that he has known diffuse intracranial disease and therefore this is likely the cause of his recent stroke. Given that the disease was fairly diffuse again Dr. Scot Dock did not recommend cerebral arteriography and consideration for intracranial angioplasty or stenting at that point. Dr. Scot Dock advised that pt continue his aspirin and Plavix and risk factor management by his primary care physician. He walks about 100 feet when he develops claudication pain and weakness in both  calves and feet, relieved by rest, there are no signs of ischemia in his feet/legs. Pt denies any further stroke or TIA symptoms since November, 2014.  Daughter states pt is not taking ASA or Plavix, states his Plavix bottle is empty; I advised pt's daughter to call the providers office on the prescription bottle and inquire as to whether he should be taking Plavix. I advised pt to resume 81 mg ASA daily since he has no history of  bleeding issues, and to reduce his risk of a CVD event.    Pt admits to smoking 1-2 cigars/day, see Plan. Fortunately he does not have DM, and takes a daily statin.    DATA Bilateral ABI's have improved compared to 03/05/15: right improved to 0.67 (TBI 0.67) from 0.53, left improved to 0.77 ( TBI 0.99) from 0.67, both with moderate bilateral arterial occlusive disease, all monophasic waveforms. Carotid duplex suggests minimal bilateral extracranial internal carotid artery stenosis; both vertebral arteries are antegrade, both subclavian arteries are multiphasic (normal). No significant change form previous exam on 03/05/15.    PLAN:   Graduated walking program discussed and how to achieve, in a safe environment.  The patient was counseled re smoking cessation and given several free resources re smoking cessation.   Based on today's exam and non-invasive vascular lab results, the patient will follow up in 1 year with the following tests: carotid duplex and ABI's. . I discussed in depth with the patient the nature of atherosclerosis, and emphasized the importance of maximal medical management including strict control of blood pressure, blood glucose, and lipid levels, obtaining regular exercise, and cessation of smoking.  The patient is aware that without maximal medical management the underlying atherosclerotic disease process will progress, limiting the benefit of any interventions.  The patient was given information about stroke prevention and what symptoms should  prompt the patient to seek immediate medical care.  The patient was given information about PAD including signs, symptoms, treatment, what symptoms should prompt the patient to seek immediate medical care, and risk reduction measures to take. Thank you for allowing [place] to participate in this patient's care.  Clemon Chambers, RN, MSN, FNP-C [facility] Office: [phone]  Clinic MD: Early 04/04/2016 10:38 AM

## 2016-04-26 ENCOUNTER — Other Ambulatory Visit: Payer: Self-pay | Admitting: Family Medicine

## 2016-04-26 ENCOUNTER — Ambulatory Visit
Admission: RE | Admit: 2016-04-26 | Discharge: 2016-04-26 | Disposition: A | Discharge status: Home / Self Care | Payer: Medicare HMO | Source: Ambulatory Visit | Attending: Family Medicine | Admitting: Family Medicine

## 2016-04-26 DIAGNOSIS — M25511 Pain in right shoulder: Secondary | ICD-10-CM

## 2016-05-13 ENCOUNTER — Emergency Department (HOSPITAL_COMMUNITY): Payer: Medicare HMO

## 2016-05-13 ENCOUNTER — Inpatient Hospital Stay (HOSPITAL_COMMUNITY): Payer: Medicare HMO

## 2016-05-13 ENCOUNTER — Encounter (HOSPITAL_COMMUNITY): Payer: Self-pay | Admitting: *Deleted

## 2016-05-13 ENCOUNTER — Inpatient Hospital Stay (HOSPITAL_COMMUNITY)
Admission: EM | Admit: 2016-05-13 | Discharge: 2016-05-23 | DRG: 207 | Disposition: A | Discharge status: Skilled Nursing Facility | Payer: Medicare HMO | Attending: Internal Medicine | Admitting: Internal Medicine

## 2016-05-13 DIAGNOSIS — I69398 Other sequelae of cerebral infarction: Secondary | ICD-10-CM | POA: Diagnosis not present

## 2016-05-13 DIAGNOSIS — R1310 Dysphagia, unspecified: Secondary | ICD-10-CM | POA: Diagnosis present

## 2016-05-13 DIAGNOSIS — F17290 Nicotine dependence, other tobacco product, uncomplicated: Secondary | ICD-10-CM | POA: Diagnosis present

## 2016-05-13 DIAGNOSIS — Z23 Encounter for immunization: Secondary | ICD-10-CM | POA: Diagnosis present

## 2016-05-13 DIAGNOSIS — M545 Low back pain: Secondary | ICD-10-CM | POA: Diagnosis present

## 2016-05-13 DIAGNOSIS — J9601 Acute respiratory failure with hypoxia: Secondary | ICD-10-CM | POA: Diagnosis present

## 2016-05-13 DIAGNOSIS — I5033 Acute on chronic diastolic (congestive) heart failure: Secondary | ICD-10-CM | POA: Diagnosis present

## 2016-05-13 DIAGNOSIS — N189 Chronic kidney disease, unspecified: Secondary | ICD-10-CM | POA: Diagnosis not present

## 2016-05-13 DIAGNOSIS — I739 Peripheral vascular disease, unspecified: Secondary | ICD-10-CM | POA: Diagnosis present

## 2016-05-13 DIAGNOSIS — N401 Enlarged prostate with lower urinary tract symptoms: Secondary | ICD-10-CM | POA: Diagnosis present

## 2016-05-13 DIAGNOSIS — E872 Acidosis: Secondary | ICD-10-CM | POA: Diagnosis present

## 2016-05-13 DIAGNOSIS — I6930 Unspecified sequelae of cerebral infarction: Secondary | ICD-10-CM

## 2016-05-13 DIAGNOSIS — T502X5A Adverse effect of carbonic-anhydrase inhibitors, benzothiadiazides and other diuretics, initial encounter: Secondary | ICD-10-CM | POA: Diagnosis not present

## 2016-05-13 DIAGNOSIS — Z4659 Encounter for fitting and adjustment of other gastrointestinal appliance and device: Secondary | ICD-10-CM

## 2016-05-13 DIAGNOSIS — N170 Acute kidney failure with tubular necrosis: Secondary | ICD-10-CM | POA: Diagnosis not present

## 2016-05-13 DIAGNOSIS — I10 Essential (primary) hypertension: Secondary | ICD-10-CM | POA: Diagnosis present

## 2016-05-13 DIAGNOSIS — M6281 Muscle weakness (generalized): Secondary | ICD-10-CM | POA: Diagnosis present

## 2016-05-13 DIAGNOSIS — E785 Hyperlipidemia, unspecified: Secondary | ICD-10-CM | POA: Diagnosis present

## 2016-05-13 DIAGNOSIS — M6282 Rhabdomyolysis: Secondary | ICD-10-CM | POA: Diagnosis present

## 2016-05-13 DIAGNOSIS — N183 Chronic kidney disease, stage 3 unspecified: Secondary | ICD-10-CM | POA: Diagnosis present

## 2016-05-13 DIAGNOSIS — E870 Hyperosmolality and hypernatremia: Secondary | ICD-10-CM | POA: Diagnosis present

## 2016-05-13 DIAGNOSIS — R740 Nonspecific elevation of levels of transaminase and lactic acid dehydrogenase [LDH]: Secondary | ICD-10-CM | POA: Diagnosis present

## 2016-05-13 DIAGNOSIS — R000 Tachycardia, unspecified: Secondary | ICD-10-CM | POA: Diagnosis not present

## 2016-05-13 DIAGNOSIS — J9600 Acute respiratory failure, unspecified whether with hypoxia or hypercapnia: Secondary | ICD-10-CM

## 2016-05-13 DIAGNOSIS — D638 Anemia in other chronic diseases classified elsewhere: Secondary | ICD-10-CM | POA: Diagnosis not present

## 2016-05-13 DIAGNOSIS — I639 Cerebral infarction, unspecified: Secondary | ICD-10-CM | POA: Diagnosis not present

## 2016-05-13 DIAGNOSIS — E875 Hyperkalemia: Secondary | ICD-10-CM | POA: Diagnosis not present

## 2016-05-13 DIAGNOSIS — G9341 Metabolic encephalopathy: Secondary | ICD-10-CM | POA: Diagnosis present

## 2016-05-13 DIAGNOSIS — I959 Hypotension, unspecified: Secondary | ICD-10-CM | POA: Diagnosis not present

## 2016-05-13 DIAGNOSIS — D62 Acute posthemorrhagic anemia: Secondary | ICD-10-CM

## 2016-05-13 DIAGNOSIS — Y92009 Unspecified place in unspecified non-institutional (private) residence as the place of occurrence of the external cause: Secondary | ICD-10-CM | POA: Diagnosis not present

## 2016-05-13 DIAGNOSIS — I130 Hypertensive heart and chronic kidney disease with heart failure and stage 1 through stage 4 chronic kidney disease, or unspecified chronic kidney disease: Secondary | ICD-10-CM | POA: Diagnosis present

## 2016-05-13 DIAGNOSIS — D696 Thrombocytopenia, unspecified: Secondary | ICD-10-CM | POA: Diagnosis not present

## 2016-05-13 DIAGNOSIS — J690 Pneumonitis due to inhalation of food and vomit: Secondary | ICD-10-CM | POA: Diagnosis present

## 2016-05-13 DIAGNOSIS — N179 Acute kidney failure, unspecified: Secondary | ICD-10-CM | POA: Diagnosis present

## 2016-05-13 DIAGNOSIS — R55 Syncope and collapse: Secondary | ICD-10-CM

## 2016-05-13 DIAGNOSIS — K219 Gastro-esophageal reflux disease without esophagitis: Secondary | ICD-10-CM | POA: Diagnosis present

## 2016-05-13 DIAGNOSIS — G8929 Other chronic pain: Secondary | ICD-10-CM | POA: Diagnosis present

## 2016-05-13 DIAGNOSIS — Z8249 Family history of ischemic heart disease and other diseases of the circulatory system: Secondary | ICD-10-CM

## 2016-05-13 DIAGNOSIS — J9690 Respiratory failure, unspecified, unspecified whether with hypoxia or hypercapnia: Secondary | ICD-10-CM

## 2016-05-13 DIAGNOSIS — F0390 Unspecified dementia without behavioral disturbance: Secondary | ICD-10-CM | POA: Diagnosis present

## 2016-05-13 DIAGNOSIS — R0600 Dyspnea, unspecified: Secondary | ICD-10-CM | POA: Diagnosis present

## 2016-05-13 DIAGNOSIS — N35919 Unspecified urethral stricture, male, unspecified site: Secondary | ICD-10-CM | POA: Diagnosis present

## 2016-05-13 DIAGNOSIS — T45526A Underdosing of antithrombotic drugs, initial encounter: Secondary | ICD-10-CM | POA: Diagnosis present

## 2016-05-13 DIAGNOSIS — N400 Enlarged prostate without lower urinary tract symptoms: Secondary | ICD-10-CM | POA: Diagnosis present

## 2016-05-13 DIAGNOSIS — R0603 Acute respiratory distress: Secondary | ICD-10-CM

## 2016-05-13 DIAGNOSIS — I493 Ventricular premature depolarization: Secondary | ICD-10-CM | POA: Diagnosis present

## 2016-05-13 DIAGNOSIS — M1990 Unspecified osteoarthritis, unspecified site: Secondary | ICD-10-CM | POA: Diagnosis present

## 2016-05-13 DIAGNOSIS — D72829 Elevated white blood cell count, unspecified: Secondary | ICD-10-CM | POA: Diagnosis present

## 2016-05-13 DIAGNOSIS — R2681 Unsteadiness on feet: Secondary | ICD-10-CM

## 2016-05-13 DIAGNOSIS — I5189 Other ill-defined heart diseases: Secondary | ICD-10-CM | POA: Diagnosis present

## 2016-05-13 DIAGNOSIS — N359 Urethral stricture, unspecified: Secondary | ICD-10-CM | POA: Diagnosis present

## 2016-05-13 DIAGNOSIS — G9340 Encephalopathy, unspecified: Secondary | ICD-10-CM

## 2016-05-13 LAB — CBC WITH DIFFERENTIAL/PLATELET
BASOS ABS: 0 10*3/uL (ref 0.0–0.1)
Basophils Relative: 0 %
Eosinophils Absolute: 0 10*3/uL (ref 0.0–0.7)
Eosinophils Relative: 0 %
HEMATOCRIT: 42.9 % (ref 39.0–52.0)
Hemoglobin: 14.2 g/dL (ref 13.0–17.0)
LYMPHS PCT: 27 %
Lymphs Abs: 2.9 10*3/uL (ref 0.7–4.0)
MCH: 32 pg (ref 26.0–34.0)
MCHC: 33.1 g/dL (ref 30.0–36.0)
MCV: 96.6 fL (ref 78.0–100.0)
Monocytes Absolute: 0.2 10*3/uL (ref 0.1–1.0)
Monocytes Relative: 2 %
NEUTROS ABS: 7.8 10*3/uL — AB (ref 1.7–7.7)
Neutrophils Relative %: 71 %
Platelets: 205 10*3/uL (ref 150–400)
RBC: 4.44 MIL/uL (ref 4.22–5.81)
RDW: 14.9 % (ref 11.5–15.5)
WBC: 11.1 10*3/uL — AB (ref 4.0–10.5)

## 2016-05-13 LAB — COMPREHENSIVE METABOLIC PANEL
ALBUMIN: 3.8 g/dL (ref 3.5–5.0)
ALT: 13 U/L — ABNORMAL LOW (ref 17–63)
AST: 21 U/L (ref 15–41)
Alkaline Phosphatase: 97 U/L (ref 38–126)
Anion gap: 12 (ref 5–15)
BILIRUBIN TOTAL: 0.5 mg/dL (ref 0.3–1.2)
BUN: 32 mg/dL — ABNORMAL HIGH (ref 6–20)
CO2: 17 mmol/L — ABNORMAL LOW (ref 22–32)
Calcium: 9 mg/dL (ref 8.9–10.3)
Chloride: 106 mmol/L (ref 101–111)
Creatinine, Ser: 3.13 mg/dL — ABNORMAL HIGH (ref 0.61–1.24)
GFR calc Af Amer: 20 mL/min — ABNORMAL LOW (ref >60)
GFR, EST NON AFRICAN AMERICAN: 17 mL/min — AB (ref >60)
Glucose, Bld: 220 mg/dL — ABNORMAL HIGH (ref 65–99)
POTASSIUM: 4.3 mmol/L (ref 3.5–5.1)
Sodium: 135 mmol/L (ref 135–145)
TOTAL PROTEIN: 7.4 g/dL (ref 6.5–8.1)

## 2016-05-13 LAB — I-STAT ARTERIAL BLOOD GAS, ED
Acid-base deficit: 6 mmol/L — ABNORMAL HIGH (ref 0.0–2.0)
Bicarbonate: 17.7 mmol/L — ABNORMAL LOW (ref 20.0–28.0)
O2 Saturation: 97 %
PCO2 ART: 28.7 mmHg — AB (ref 32.0–48.0)
PH ART: 7.398 (ref 7.350–7.450)
Patient temperature: 98.6
TCO2: 19 mmol/L (ref 0–100)
pO2, Arterial: 94 mmHg (ref 83.0–108.0)

## 2016-05-13 LAB — I-STAT VENOUS BLOOD GAS, ED
Acid-base deficit: 9 mmol/L — ABNORMAL HIGH (ref 0.0–2.0)
BICARBONATE: 19.9 mmol/L — AB (ref 20.0–28.0)
O2 Saturation: 42 %
PO2 VEN: 29 mmHg — AB (ref 32.0–45.0)
TCO2: 21 mmol/L (ref 0–100)
pCO2, Ven: 51.2 mmHg (ref 44.0–60.0)
pH, Ven: 7.197 — CL (ref 7.250–7.430)

## 2016-05-13 LAB — TRIGLYCERIDES: Triglycerides: 258 mg/dL — ABNORMAL HIGH (ref <150)

## 2016-05-13 LAB — I-STAT CHEM 8, ED
BUN: 52 mg/dL — AB (ref 6–20)
CREATININE: 3 mg/dL — AB (ref 0.61–1.24)
Calcium, Ion: 1.04 mmol/L — ABNORMAL LOW (ref 1.15–1.40)
Chloride: 108 mmol/L (ref 101–111)
Glucose, Bld: 174 mg/dL — ABNORMAL HIGH (ref 65–99)
HCT: 48 % (ref 39.0–52.0)
Hemoglobin: 16.3 g/dL (ref 13.0–17.0)
Potassium: 6.5 mmol/L (ref 3.5–5.1)
Sodium: 135 mmol/L (ref 135–145)
TCO2: 21 mmol/L (ref 0–100)

## 2016-05-13 LAB — I-STAT TROPONIN, ED: Troponin i, poc: 0.04 ng/mL (ref 0.00–0.08)

## 2016-05-13 LAB — CBG MONITORING, ED: Glucose-Capillary: 105 mg/dL — ABNORMAL HIGH (ref 65–99)

## 2016-05-13 LAB — I-STAT CG4 LACTIC ACID, ED: LACTIC ACID, VENOUS: 4.45 mmol/L — AB (ref 0.5–1.9)

## 2016-05-13 LAB — PROTIME-INR
INR: 1.1
Prothrombin Time: 14.2 seconds (ref 11.4–15.2)

## 2016-05-13 MED ORDER — MIDAZOLAM HCL 2 MG/2ML IJ SOLN
1.0000 mg | INTRAMUSCULAR | Status: DC | PRN
  Administered 2016-05-13: 1 mg via INTRAVENOUS

## 2016-05-13 MED ORDER — FAMOTIDINE IN NACL 20-0.9 MG/50ML-% IV SOLN
20.0000 mg | INTRAVENOUS | Status: DC
  Administered 2016-05-14 – 2016-05-19 (×7): 20 mg via INTRAVENOUS
  Filled 2016-05-13 (×8): qty 50

## 2016-05-13 MED ORDER — SODIUM CHLORIDE 0.9 % IV BOLUS (SEPSIS)
1000.0000 mL | Freq: Once | INTRAVENOUS | Status: AC
  Administered 2016-05-13: 1000 mL via INTRAVENOUS

## 2016-05-13 MED ORDER — ENOXAPARIN SODIUM 30 MG/0.3ML ~~LOC~~ SOLN
30.0000 mg | SUBCUTANEOUS | Status: DC
Start: 2016-05-13 — End: 2016-05-14
  Administered 2016-05-14: 30 mg via SUBCUTANEOUS
  Filled 2016-05-13: qty 0.3

## 2016-05-13 MED ORDER — VANCOMYCIN HCL 10 G IV SOLR
1500.0000 mg | Freq: Once | INTRAVENOUS | Status: AC
  Administered 2016-05-13: 1500 mg via INTRAVENOUS
  Filled 2016-05-13: qty 1500

## 2016-05-13 MED ORDER — PROPOFOL 1000 MG/100ML IV EMUL
0.0000 ug/kg/min | INTRAVENOUS | Status: DC
  Filled 2016-05-13: qty 100

## 2016-05-13 MED ORDER — IOPAMIDOL (ISOVUE-370) INJECTION 76%
INTRAVENOUS | Status: AC
  Filled 2016-05-13: qty 100

## 2016-05-13 MED ORDER — SODIUM CHLORIDE 0.9 % IV SOLN
1.0000 g | Freq: Once | INTRAVENOUS | Status: AC
  Administered 2016-05-13: 1 g via INTRAVENOUS
  Filled 2016-05-13 (×2): qty 10

## 2016-05-13 MED ORDER — MIDAZOLAM HCL 2 MG/2ML IJ SOLN
2.0000 mg | Freq: Once | INTRAMUSCULAR | Status: AC
  Administered 2016-05-13: 2 mg via INTRAVENOUS

## 2016-05-13 MED ORDER — VANCOMYCIN HCL IN DEXTROSE 750-5 MG/150ML-% IV SOLN
750.0000 mg | INTRAVENOUS | Status: DC
  Administered 2016-05-14 – 2016-05-15 (×2): 750 mg via INTRAVENOUS
  Filled 2016-05-13 (×2): qty 150

## 2016-05-13 MED ORDER — ETOMIDATE 2 MG/ML IV SOLN
INTRAVENOUS | Status: DC | PRN
  Administered 2016-05-13: 10 mg via INTRAVENOUS
  Administered 2016-05-13: 30 mg via INTRAVENOUS

## 2016-05-13 MED ORDER — MIDAZOLAM HCL 2 MG/2ML IJ SOLN
1.0000 mg | INTRAMUSCULAR | Status: DC | PRN
  Administered 2016-05-13 (×2): 1 mg via INTRAVENOUS
  Filled 2016-05-13 (×4): qty 2

## 2016-05-13 MED ORDER — SODIUM CHLORIDE 0.9 % IV SOLN
250.0000 mL | INTRAVENOUS | Status: DC | PRN
  Administered 2016-05-16: 10 mL/h via INTRAVENOUS

## 2016-05-13 MED ORDER — ONDANSETRON HCL 4 MG/2ML IJ SOLN
4.0000 mg | Freq: Once | INTRAMUSCULAR | Status: AC
  Administered 2016-05-13: 4 mg via INTRAVENOUS
  Filled 2016-05-13: qty 2

## 2016-05-13 MED ORDER — PROPOFOL 1000 MG/100ML IV EMUL
5.0000 ug/kg/min | Freq: Once | INTRAVENOUS | Status: AC
  Administered 2016-05-13: 10 ug/kg/min via INTRAVENOUS

## 2016-05-13 MED ORDER — PIPERACILLIN-TAZOBACTAM 3.375 G IVPB 30 MIN
3.3750 g | Freq: Once | INTRAVENOUS | Status: AC
  Administered 2016-05-13: 3.375 g via INTRAVENOUS
  Filled 2016-05-13: qty 50

## 2016-05-13 MED ORDER — FENTANYL 2500MCG IN NS 250ML (10MCG/ML) PREMIX INFUSION
25.0000 ug/h | INTRAVENOUS | Status: DC
  Administered 2016-05-13: 10 ug/h via INTRAVENOUS
  Administered 2016-05-13: 20 ug/h via INTRAVENOUS
  Administered 2016-05-15: 125 ug/h via INTRAVENOUS
  Administered 2016-05-15: 75 ug/h via INTRAVENOUS
  Administered 2016-05-16: 150 ug/h via INTRAVENOUS
  Administered 2016-05-17: 200 ug/h via INTRAVENOUS
  Administered 2016-05-18: 25 ug/h via INTRAVENOUS
  Administered 2016-05-19: 200 ug/h via INTRAVENOUS
  Filled 2016-05-13 (×9): qty 250

## 2016-05-13 MED ORDER — PIPERACILLIN-TAZOBACTAM IN DEX 2-0.25 GM/50ML IV SOLN
2.2500 g | Freq: Three times a day (TID) | INTRAVENOUS | Status: DC
  Administered 2016-05-14 – 2016-05-16 (×8): 2.25 g via INTRAVENOUS
  Filled 2016-05-13 (×14): qty 50

## 2016-05-13 MED ORDER — PROPOFOL 1000 MG/100ML IV EMUL
INTRAVENOUS | Status: AC
  Filled 2016-05-13: qty 100

## 2016-05-13 MED ORDER — FENTANYL CITRATE (PF) 100 MCG/2ML IJ SOLN
100.0000 ug | Freq: Once | INTRAMUSCULAR | Status: AC
  Administered 2016-05-13: 100 ug via INTRAVENOUS
  Filled 2016-05-13: qty 2

## 2016-05-13 MED ORDER — INSULIN ASPART 100 UNIT/ML ~~LOC~~ SOLN
2.0000 [IU] | SUBCUTANEOUS | Status: DC
  Administered 2016-05-14 – 2016-05-16 (×4): 2 [IU] via SUBCUTANEOUS

## 2016-05-13 MED ORDER — SUCCINYLCHOLINE CHLORIDE 20 MG/ML IJ SOLN
INTRAMUSCULAR | Status: DC | PRN
  Administered 2016-05-13: 120 mg via INTRAVENOUS

## 2016-05-13 NOTE — ED Notes (Signed)
Foley was attempted, CCM aware, stated possible urology consult in AM, condom cath for now

## 2016-05-13 NOTE — ED Notes (Signed)
EDP at bedside. Order to stop propofol and start fentanyl.

## 2016-05-13 NOTE — ED Notes (Signed)
Placed patient into a gown and on the monitor did ekg shown er doctor

## 2016-05-13 NOTE — Progress Notes (Signed)
Patient intubated due to respiratory distress and low saturation 85% on 100% NRB, patient labor breathing and complained of SOB, good color change on ETCO2 detector, good BBS after intubation, and SATS improved to 95% on the above vent settings, will continue to monitor patient.

## 2016-05-13 NOTE — ED Notes (Signed)
Spoke to MD - MD aware patient's BP is low systolic 86. MD states to turn down propofol to 40mcgs

## 2016-05-13 NOTE — ED Triage Notes (Signed)
Pt here from home via [facility] initially for syncope.  Pt was confused, per ems and en-route sats dropped to  85%.

## 2016-05-13 NOTE — ED Provider Notes (Signed)
[facility] DEPT Provider Note   CSN: [id number] Arrival date & time: 05/13/16  1707     History   Chief Complaint Chief Complaint  Patient presents with  . Respiratory Distress    HPI Bobby Vega is a 81 y.o. male.  HPI   81 yo with hisotyr of aortobifemoral bypass graft by Dr. Trula Slade in January of 2013, stroke in 2014, non compliant with asa and plavix here with SOB. EMS stated that they were called to the house because of syncope. Patient refused to come. Patient then became to act more bizarre so family called for EMS again. At this point EMS found him and he started to breathe more heavily on arrival to the hospital. Initially patient not requiring any oxygen by the time he reached the hospital patient is on 15 L with an oxygen saturation of 85.    Past Medical History:  Diagnosis Date  . Arthritis    hip  . Borderline diabetes   . BPH (benign prostatic hyperplasia)    takes Avodart daily  . Bronchitis    yrs ago  . Cervical spondylosis with myelopathy   . Cervical spondylosis without myelopathy   . Chronic back pain   . Constipation    Metamucil prn  . Diverticulosis   . ED (erectile dysfunction)    takes Cialis as needed as well as Viagra  . Embolism - blood clot    in stomach  . GERD (gastroesophageal reflux disease)    takes Nexium daily  . Hyperlipidemia    takes Zocor nightly  . Hypertension    takes Losartan daily  . Lumbar spondylosis with myelopathy   . Lumbar spondylosis with myelopathy   . Nocturia   . Pain in joint, hand   . Peripheral vascular disease ([place])   . Stroke ([place])   . Urinary urgency     Patient Active Problem List   Diagnosis Date Noted  . Pain in limb 06/10/2014  . Numbness-Bilateral Shoulder-Hand 06/10/2014  . Hand weakness 06/04/2013  . Left arm weakness 06/04/2013  . CVA (cerebral vascular accident) ([place]) 05/17/2013  . Other and unspecified hyperlipidemia 05/16/2013  . GERD (gastroesophageal reflux disease)   .  Hypertension   . Right arm weakness 05/15/2013  . Open wound of abdominal wall, lateral, without mention of complication 76/28/3151  . PVD (peripheral vascular disease) with claudication ([place]) 08/02/2011  . Atherosclerosis of native arteries of the extremities with intermittent claudication 07/10/2011  . Occlusion and stenosis of carotid artery without mention of cerebral infarction 07/10/2011  . Peripheral vascular disease, unspecified 06/26/2011    Past Surgical History:  Procedure Laterality Date  . ABDOMINAL AORTAGRAM N/A 07/04/2011   Procedure: ABDOMINAL Maxcine Ham;  Surgeon: Serafina Mitchell, MD;  Location: [facility] CATH LAB;  Service: Cardiovascular;  Laterality: N/A;  . AORTA - BILATERAL FEMORAL ARTERY BYPASS GRAFT  07/13/2011   Procedure: AORTA BIFEMORAL BYPASS GRAFT;  Surgeon: Theotis Burrow, MD;  Location: [facility];  Service: Vascular;  Laterality: Bilateral;  . CYSTOSCOPY  07/13/2011   Procedure: CYSTOSCOPY;  Surgeon: Hanley Ben, MD;  Location: [facility];  Service: Urology;  Laterality: N/A;  cystoscopy,dilatation & insertion of councill foley catheter  . LOWER EXTREMITY ANGIOGRAM Bilateral 07/04/2011   Procedure: LOWER EXTREMITY ANGIOGRAM;  Surgeon: Serafina Mitchell, MD;  Location: [facility] CATH LAB;  Service: Cardiovascular;  Laterality: Bilateral;  . SKIN GRAFT  1983       Home Medications    Prior to Admission medications  Medication Sig Start Date End Date Taking? Authorizing Provider  amLODipine (NORVASC) 10 MG tablet Take 1 tablet (10 mg total) by mouth daily. 05/18/13   Ripudeep Krystal Eaton, MD  aspirin 81 MG EC tablet Take 1 tablet (81 mg total) by mouth daily. For 3 months with plavix. Patient not taking: Reported on 04/04/2016 05/18/13   Ripudeep Krystal Eaton, MD  atorvastatin (LIPITOR) 20 MG tablet Take 1 tablet (20 mg total) by mouth daily. 05/18/13   Ripudeep Krystal Eaton, MD  ciprofloxacin (CIPRO) 500 MG tablet Take 1 tablet (500 mg total) by mouth 2 (two) times daily. X 6days Patient not taking:  Reported on 04/04/2016 05/18/13   Ripudeep Krystal Eaton, MD  clopidogrel (PLAVIX) 75 MG tablet Take 1 tablet (75 mg total) by mouth daily with breakfast. Patient not taking: Reported on 04/04/2016 05/18/13   Ripudeep Krystal Eaton, MD  COLCRYS 0.6 MG tablet  01/31/16   Historical Provider, MD  esomeprazole (NEXIUM) 40 MG capsule Take 40 mg by mouth daily before breakfast.     Historical Provider, MD  furosemide (LASIX) 20 MG tablet daily. 11/10/13   Historical Provider, MD  hydrALAZINE (APRESOLINE) 25 MG tablet Take 1 tablet (25 mg total) by mouth 2 (two) times daily. 05/18/13   Ripudeep Krystal Eaton, MD  HYDROcodone-acetaminophen (NORCO/VICODIN) 5-325 MG per tablet Take 1 tablet by mouth daily as needed (pain).  03/13/13   Historical Provider, MD  pregabalin (LYRICA) 75 MG capsule Take 75 mg by mouth 3 (three) times daily.     Historical Provider, MD  Vitamin D, Ergocalciferol, (DRISDOL) 50000 UNITS CAPS capsule Take 50,000 Units by mouth every 7 (seven) days. Take on Friday or Saturday    Historical Provider, MD    Family History Family History  Problem Relation Age of Onset  . Heart disease Mother     Before age 17  . Heart disease Brother     NOT  before age 52  . Heart attack Brother   . Anesthesia problems Neg Hx   . Hypotension Neg Hx   . Malignant hyperthermia Neg Hx   . Pseudochol deficiency Neg Hx     Social History Social History  Substance Use Topics  . Smoking status: Current Some Day Smoker    Years: 0.50    Types: Cigars    Last attempt to quit: 04/29/1992  . Smokeless tobacco: Never Used  . Alcohol use No     Allergies   Patient has no known allergies.   Review of Systems Review of Systems   Physical Exam Updated Vital Signs BP 164/86 (BP Location: Right Arm)   Pulse (!) 121   Resp 15   Wt 160 lb (72.6 kg)   SpO2 95%   BMI 26.63 kg/m   Physical Exam   ED Treatments / Results  Labs (all labs ordered are listed, but only abnormal results are displayed) Labs Reviewed    CBC WITH DIFFERENTIAL/PLATELET - Abnormal; Notable for the following:       Result Value   WBC 11.1 (*)    Neutro Abs 7.8 (*)    All other components within normal limits  I-STAT CHEM 8, ED - Abnormal; Notable for the following:    Potassium 6.5 (*)    BUN 52 (*)    Creatinine, Ser 3.00 (*)    Glucose, Bld 174 (*)    Calcium, Ion 1.04 (*)    All other components within normal limits  I-STAT VENOUS BLOOD GAS, ED - Abnormal;  Notable for the following:    pH, Ven 7.197 (*)    pO2, Ven 29.0 (*)    Bicarbonate 19.9 (*)    Acid-base deficit 9.0 (*)    All other components within normal limits  I-STAT ARTERIAL BLOOD GAS, ED - Abnormal; Notable for the following:    pCO2 arterial 28.7 (*)    Bicarbonate 17.7 (*)    Acid-base deficit 6.0 (*)    All other components within normal limits  COMPREHENSIVE METABOLIC PANEL  PROTIME-INR  I-STAT TROPOININ, ED    EKG  EKG Interpretation None       Radiology Dg Chest Portable 1 View  Result Date: 05/13/2016 CLINICAL DATA:  Syncope and confusion EXAM: PORTABLE CHEST 1 VIEW COMPARISON:  None. FINDINGS: There is patchy airspace opacity throughout the left mid lower lung zone as well as in the right base. Heart size and pulmonary vascularity are normal. No adenopathy. No bone lesions. IMPRESSION: Multifocal opacities. Suspect either multifocal pneumonia or aspiration as most likely etiologies. Heart size within normal limits. No appreciable adenopathy. Electronically Signed   By: Lowella Grip III M.D.   On: 05/13/2016 17:41    Procedures Procedures (including critical care time)  Medications Ordered in ED Medications  etomidate (AMIDATE) injection (10 mg Intravenous Given 05/13/16 1755)  succinylcholine (ANECTINE) injection (120 mg Intravenous Given 05/13/16 1751)  propofol (DIPRIVAN) 1000 MG/100ML infusion (not administered)  sodium chloride 0.9 % bolus 1,000 mL (not administered)  calcium chloride 1 g in sodium chloride 0.9 % 100  mL IVPB (not administered)  propofol (DIPRIVAN) 1000 MG/100ML infusion (10 mcg/kg/min  72.6 kg Intravenous New Bag/Given 05/13/16 1811)     Initial Impression / Assessment and Plan / ED Course  I have reviewed the triage vital signs and the nursing notes.  Pertinent labs & imaging results that were available during my care of the patient were reviewed by me and considered in my medical decision making (see chart for details).  Clinical Course    Initial arrival patient hypoxic to 85% on 15 L and breathing 40 times a minute. Patient initially stabilized axis gotten patient continued to breathe quickly and patient's family arrived. [name] past medical history showed history of clotting, aorta-femoral bypass and patient supposed to be onPlavix but is not.    Family right until a different story than EMS they reported that he was vomiting profusely and uncontrollably and that is why they called EMS.  Discussed with family that patient's wishes about intubation versus more conservative care. Patient family wish to move forward with intubation..  Initially I thought that this was a PE given his normal breath sounds with tachypnea and hypoxia.  Ordered CAT scan the patient's creatinine is 3.  The new history in  conjunction with the x-ray showing multi-focal airway disease makes me have a stronger suspicion for aspiration pneumonia.  Will treat for aspiration pneumonia. We'll do non-con of head- worried why it was that he began vomiting in the first place- MI? ICH? Awaiting results  7:15 PM Hypotensive after multiple boluses of propofol because of difficulty sedating. Will bolus, MAP stil > 60. Awaiting lactic.  Fluid for sepsis ordered.    Crit care called, and patient accetped.   CRITICAL CARE Performed by: Gardiner Sleeper Total critical care time: 60 minutes Critical care time was exclusive of separately billable procedures and treating other patients. Critical care was  necessary to treat or prevent imminent or life-threatening deterioration. Critical care was time spent personally by me  on the following activities: development of treatment plan with patient and/or surrogate as well as nursing, discussions with consultants, evaluation of patient's response to treatment, examination of patient, obtaining history from patient or surrogate, ordering and performing treatments and interventions, ordering and review of laboratory studies, ordering and review of radiographic studies, pulse oximetry and re-evaluation of patient's condition.  INTUBATION Performed by: Gardiner Sleeper  Required items: required blood products, implants, devices, and special equipment available Patient identity confirmed: provided demographic data and hospital-assigned identification number Time out: Immediately prior to procedure a "time out" was called to verify the correct patient, procedure, equipment, support staff and site/side marked as required.  Indications: repiratory failure  Intubation method: Glidescope Laryngoscopy   Preoxygenation:BVM  Sedatives: Etomidate Paralytic: Succinylcholine  Tube Size: 7.5 cuffed  Post-procedure assessment: chest rise and ETCO2 monitor Breath sounds: equal and absent over the epigastrium Tube secured with: ETT holder Chest x-ray interpreted by radiologist and me.  Chest x-ray findings: endotracheal tube in appropriate position  Patient tolerated the procedure well with no immediate complications.      Final Clinical Impressions(s) / ED Diagnoses   Final diagnoses:  None    New Prescriptions New Prescriptions   No medications on file     Nakisha Chai Julio Alm, MD 05/14/16 2229

## 2016-05-13 NOTE — ED Triage Notes (Signed)
Patient comes in per [facility]. [name] family called EMS b/c patient had syncopal episode. Patient was confused with EMS. Hx of dementia. Patient became sob upon arrival to ED. Patient is currently 90% on NR. MD aware and at bedside. Patient responds to verbal stimulation. Able to answer questions appropriately. Labored breathing noted. Lung sounds clear.

## 2016-05-13 NOTE — ED Notes (Signed)
Nurse Herbert Spires and PA Marlon Pel was made aware of the critical lab result..(KT)

## 2016-05-13 NOTE — ED Notes (Signed)
CCM aware of lactic acid

## 2016-05-13 NOTE — H&P (Signed)
PULMONARY / CRITICAL CARE MEDICINE   Name: Bobby Vega MRN: [id number] DOB: 1934-08-15    ADMISSION DATE:  05/13/2016 CONSULTATION DATE:    REFERRING MD:  EDP  CHIEF COMPLAINT:  Respiratory distress  HISTORY OF PRESENT ILLNESS:   Bobby Vega is a 41M with history of dementia, GERD, HLD, HTN, PVD, prior stroke, prior blood clots, and BPH with urgency, who presents via EMS following syncopal episode. En route patient was confused and had increasing shortness of breath. On arrival to ED patient placed on NRB with sats in the 80s; decision made to intubate. Family (daughter) provides additional history that he had some friends over and was vomiting a lot. He got very pale and they called EMS. His oxygen levels were reportedly a little low, but the patient refused to go with them to the hospital. After EMS left, he had another episode of vomiting and EMS was again called and this time was successful in bringing him in for evaluation. No clear history of syncope. No sick contacts. No known exposures or other complaints related to vomiting. No report of abdominal pain or chest pain. No cough / sputum / fever / chills leading up to this. Head CT is pending. CXR showed bilateral opacities concerning for aspiration. Antibiotics were initiated in the ED with vancomycin and zosyn.  PAST MEDICAL HISTORY :  He  has a past medical history of Arthritis; Borderline diabetes; BPH (benign prostatic hyperplasia); Bronchitis; Cervical spondylosis with myelopathy; Cervical spondylosis without myelopathy; Chronic back pain; Constipation; Diverticulosis; ED (erectile dysfunction); Embolism - blood clot; GERD (gastroesophageal reflux disease); Hyperlipidemia; Hypertension; Lumbar spondylosis with myelopathy; Lumbar spondylosis with myelopathy; Nocturia; Pain in joint, hand; Peripheral vascular disease ([place]); Stroke [facility]); and Urinary urgency.  PAST SURGICAL HISTORY: He  has a past surgical history that includes Skin  graft (1983); Aorta - bilateral femoral artery bypass graft (07/13/2011); Cystoscopy (07/13/2011); abdominal aortagram (N/A, 07/04/2011); and lower extremity angiogram (Bilateral, 07/04/2011).  No Known Allergies  No current facility-administered medications on file prior to encounter.    Current Outpatient Prescriptions on File Prior to Encounter  Medication Sig  . clopidogrel (PLAVIX) 75 MG tablet Take 1 tablet (75 mg total) by mouth daily with breakfast.  . esomeprazole (NEXIUM) 40 MG capsule Take 40 mg by mouth daily before breakfast.   . hydrALAZINE (APRESOLINE) 25 MG tablet Take 1 tablet (25 mg total) by mouth 2 (two) times daily.  . Vitamin D, Ergocalciferol, (DRISDOL) 50000 UNITS CAPS capsule Take 50,000 Units by mouth every 7 (seven) days. Take on Friday or Saturday  . aspirin 81 MG EC tablet Take 1 tablet (81 mg total) by mouth daily. For 3 months with plavix. (Patient not taking: Reported on 05/13/2016)  . atorvastatin (LIPITOR) 20 MG tablet Take 1 tablet (20 mg total) by mouth daily. (Patient not taking: Reported on 05/13/2016)  . ciprofloxacin (CIPRO) 500 MG tablet Take 1 tablet (500 mg total) by mouth 2 (two) times daily. X 6days (Patient not taking: Reported on 05/13/2016)    FAMILY HISTORY:  His indicated that his mother is deceased. He indicated that his father is deceased. He indicated that only one of his two sisters is alive. He indicated that his brother is deceased. He indicated that the status of his neg hx is unknown.    SOCIAL HISTORY: He  reports that he has been smoking Cigars.  He has smoked for the past 0.50 years. He has never used smokeless tobacco. He reports that he does not drink alcohol  or use drugs.  REVIEW OF SYSTEMS:   Unable to obtain 2/2 intubated state  SUBJECTIVE:    VITAL SIGNS: BP 156/65   Pulse 104   Resp 19   Ht 5\' 10"  (1.778 m)   Wt 72.6 kg (160 lb)   SpO2 100%   BMI 22.96 kg/m   HEMODYNAMICS:    VENTILATOR SETTINGS: Vent Mode:  PRVC FiO2 (%):  [100 %] 100 % Set Rate:  [20 bmp] 20 bmp Vt Set:  [500 mL] 500 mL PEEP:  [5 cmH20] 5 cmH20 Plateau Pressure:  [24 cmH20] 24 cmH20  INTAKE / OUTPUT: No intake/output data recorded.  PHYSICAL EXAMINATION:  General Well nourished, well developed, intubated  HEENT No gross abnormalities. ETT and OGT in place.   Pulmonary Coarse breath sounds bilaterally, but no overt wheezes, rales or ronchi. Vent-assisted effort, symmetrical expansion.   Cardiovascular Tachy 110s-120s, regular rhythm. S1, s2. No m/r/g. Distal pulses palpable, but diminished in the lower extremities.  Abdomen Soft, non-tender, distended, positive bowel sounds, no palpable organomegaly or masses. Normoresonant to percussion.  Musculoskeletal Grossly normal.  Lymphatics No cervical, supraclavicular or axillary adenopathy.   Neurologic Grossly intact. No focal deficits. Follows commands (thumbs up, wiggle toes) while on fentanyl gtt  Skin/Integuement No rash, no cyanosis, no clubbing. No edema.     LABS:  BMET  Recent Labs Lab 05/13/16 1736 05/13/16 1746  NA 135 135  K 4.3 6.5*  CL 106 108  CO2 17*  --   BUN 32* 52*  CREATININE 3.13* 3.00*  GLUCOSE 220* 174*    Electrolytes  Recent Labs Lab 05/13/16 1736  CALCIUM 9.0    CBC  Recent Labs Lab 05/13/16 1736 05/13/16 1746  WBC 11.1*  --   HGB 14.2 16.3  HCT 42.9 48.0  PLT 205  --     Coag's  Recent Labs Lab 05/13/16 1736  INR 1.10    Sepsis Markers No results for input(s): LATICACIDVEN, PROCALCITON, O2SATVEN in the last 168 hours.  ABG  Recent Labs Lab 05/13/16 1735  PHART 7.398  PCO2ART 28.7*  PO2ART 94.0    Liver Enzymes  Recent Labs Lab 05/13/16 1736  AST 21  ALT 13*  ALKPHOS 97  BILITOT 0.5  ALBUMIN 3.8    Cardiac Enzymes No results for input(s): TROPONINI, PROBNP in the last 168 hours.  Glucose No results for input(s): GLUCAP in the last 168 hours.  Imaging Dg Chest Portable 1  View  Result Date: 05/13/2016 CLINICAL DATA:  Endotracheal tube and nasogastric tube placement. EXAM: PORTABLE CHEST 1 VIEW COMPARISON:  05/13/2016 at 1730 hours. FINDINGS: Endotracheal tube terminates approximately 4.8 cm above the carina. Nasogastric tube is followed into the stomach. Trachea is midline. Heart size normal. Patchy airspace opacification is again seen in the left perihilar region with minimal involvement of the right lower lobe. No pleural fluid. IMPRESSION: 1. Satisfactory endotracheal tube and nasogastric tube placement. 2. Patchy bilateral airspace opacification, left greater than right, possibly due to pneumonia. Electronically Signed   By: Lorin Picket M.D.   On: 05/13/2016 18:12   Dg Chest Portable 1 View  Result Date: 05/13/2016 CLINICAL DATA:  Syncope and confusion EXAM: PORTABLE CHEST 1 VIEW COMPARISON:  None. FINDINGS: There is patchy airspace opacity throughout the left mid lower lung zone as well as in the right base. Heart size and pulmonary vascularity are normal. No adenopathy. No bone lesions. IMPRESSION: Multifocal opacities. Suspect either multifocal pneumonia or aspiration as most likely etiologies. Heart size within  normal limits. No appreciable adenopathy. Electronically Signed   By: Lowella Grip III M.D.   On: 05/13/2016 17:41     STUDIES:  CXR, head CT as above  CULTURES: Blood, urine pending  ANTIBIOTICS: Vancomycin 11/18 >> Zosyn 11/18 >>  SIGNIFICANT EVENTS: Intubation in ED  LINES/TUBES: ETT 11/18 >> PIV  DISCUSSION: Mr. Boakye is a 59M with PMH of dementia, PVD, prior stroke, prior blood clots, HTN, HLD, GERD, who presents with acute hypoxemic respiratory failure thought secondary to massive aspiration. The etiology of his nausea, vomiting and questionable syncope is as yet unclear. No further vomiting was reported in the ED. He is being covered with   ASSESSMENT / PLAN:  PULMONARY A: Acute hypoxemic respiratory failure, thought  2/2 aspiration P:   Continue ventilatory support Wean as tolerated Continue antibiotics Follow cultures  CARDIOVASCULAR A:  Sinus tachycardia P:  Monitor Consider low dose BB as BP tolerates  RENAL A:   Acute kidney injury w/ Cr. 3.13. (baseline unclear, but likely <2) Metabolic acidosis  P:   Fluid resuscitate Trend BMP UA Correct lytes as needed Check lactate Avoid nephrotoxins  GASTROINTESTINAL A:   Nausea and vomiting - unclear etiology P:   NPO Famotidine iv for gi ppx Prn antiemetics  HEMATOLOGIC A:   Leukocytosis P:  Trend CBC  INFECTIOUS A:   Possible evolving pneumonia 2/2 aspiration P:   Continue antibiotics (vanc/zosyn for now) De-escalate as able Follow cultures  ENDOCRINE A:   Hyperglycemia   P:  CBGs and SSI  NEUROLOGIC A:   Altered mental status - likely toxic metabolic encephalopathy P:   RASS goal: 0 to -1 Correct acidosis F/u head ct   FAMILY  - Updates: daughter at bedside. She and her brother think he would want to be a FULL CODE for now, but also acknowledge that he would not want to be sustained on life support indefinitely.   - Inter-disciplinary family meet or Palliative Care meeting due by:  day 7  The patient is critically ill with multiple organ system failure and requires high complexity decision making for assessment and support, frequent evaluation and titration of therapies, advanced monitoring, review of radiographic studies and interpretation of complex data.   Critical Care Time devoted to patient care services, exclusive of separately billable procedures, described in this note is 42 minutes.   Yisroel Ramming, MD Pulmonary and Critical Care Medicine [facility] Pager: [phone]  05/13/2016, 8:02 PM

## 2016-05-13 NOTE — ED Notes (Signed)
Got patient into a gown and on the mpnitor did ekg

## 2016-05-13 NOTE — Progress Notes (Addendum)
Pharmacy Antibiotic Note  Melton Walls is a 81 y.o. male admitted on 05/13/2016 with pneumonia.  Pharmacy has been consulted for vancomycin and Zosyn dosing.  Patient intubated in the ED. SCr 3.13, last values we have on file are from 2014- 1.6-2.5 at that time (?if admitted with AKI), nothing in [name]. Suspect AKI or AoCKD. CrCl ~57mL/min.  Plan: -1500mg  IV x1, then 750mg  IV q24h (based on population kinetics). Goal trough 15-20 -Zosyn 3.375g IV x1, then Zosyn 2.25g IV q8h -follow renal function, clinical progression, level PRN -note done 11/18  Height: 5\' 10"  (177.8 cm) Weight: 160 lb (72.6 kg) IBW/kg (Calculated) : 73  No data recorded.   Recent Labs Lab 05/13/16 1736 05/13/16 1746  WBC 11.1*  --   CREATININE  --  3.00*    Estimated Creatinine Clearance: 20.5 mL/min (by C-G formula based on SCr of 3 mg/dL (H)).    No Known Allergies  Antimicrobials this admission: Vancomycin 11/18 >>  Zosyn 11/18 >>   Dose adjustments this admission: N./a  Microbiology results: Nothing sent  Thank you for allowing pharmacy to be a part of this patient's care.  Pat Sires D. Riggins Cisek, PharmD, BCPS Clinical Pharmacist Pager: [phone] 05/13/2016 6:47 PM

## 2016-05-14 ENCOUNTER — Inpatient Hospital Stay (HOSPITAL_COMMUNITY): Payer: Medicare HMO

## 2016-05-14 ENCOUNTER — Encounter (HOSPITAL_COMMUNITY): Payer: Self-pay | Admitting: *Deleted

## 2016-05-14 DIAGNOSIS — J9601 Acute respiratory failure with hypoxia: Secondary | ICD-10-CM

## 2016-05-14 DIAGNOSIS — R0603 Acute respiratory distress: Secondary | ICD-10-CM | POA: Diagnosis present

## 2016-05-14 DIAGNOSIS — N184 Chronic kidney disease, stage 4 (severe): Secondary | ICD-10-CM

## 2016-05-14 DIAGNOSIS — N170 Acute kidney failure with tubular necrosis: Secondary | ICD-10-CM

## 2016-05-14 DIAGNOSIS — R0600 Dyspnea, unspecified: Secondary | ICD-10-CM

## 2016-05-14 DIAGNOSIS — N189 Chronic kidney disease, unspecified: Secondary | ICD-10-CM

## 2016-05-14 DIAGNOSIS — N179 Acute kidney failure, unspecified: Secondary | ICD-10-CM

## 2016-05-14 DIAGNOSIS — J690 Pneumonitis due to inhalation of food and vomit: Principal | ICD-10-CM

## 2016-05-14 LAB — GLUCOSE, CAPILLARY
GLUCOSE-CAPILLARY: 125 mg/dL — AB (ref 65–99)
GLUCOSE-CAPILLARY: 93 mg/dL (ref 65–99)
Glucose-Capillary: 100 mg/dL — ABNORMAL HIGH (ref 65–99)
Glucose-Capillary: 102 mg/dL — ABNORMAL HIGH (ref 65–99)
Glucose-Capillary: 119 mg/dL — ABNORMAL HIGH (ref 65–99)

## 2016-05-14 LAB — CBC
HCT: 38.3 % — ABNORMAL LOW (ref 39.0–52.0)
HEMATOCRIT: 42.3 % (ref 39.0–52.0)
HEMOGLOBIN: 13.4 g/dL (ref 13.0–17.0)
Hemoglobin: 11.9 g/dL — ABNORMAL LOW (ref 13.0–17.0)
MCH: 31.1 pg (ref 26.0–34.0)
MCH: 30.6 pg (ref 26.0–34.0)
MCHC: 31.7 g/dL (ref 30.0–36.0)
MCHC: 31.1 g/dL (ref 30.0–36.0)
MCV: 98.1 fL (ref 78.0–100.0)
MCV: 98.5 fL (ref 78.0–100.0)
PLATELETS: 135 10*3/uL — AB (ref 150–400)
PLATELETS: 159 10*3/uL (ref 150–400)
RBC: 4.31 MIL/uL (ref 4.22–5.81)
RBC: 3.89 MIL/uL — AB (ref 4.22–5.81)
RDW: 15.4 % (ref 11.5–15.5)
RDW: 15.3 % (ref 11.5–15.5)
WBC: 3.1 10*3/uL — AB (ref 4.0–10.5)
WBC: 7 10*3/uL (ref 4.0–10.5)

## 2016-05-14 LAB — POCT I-STAT 3, ART BLOOD GAS (G3+)
Acid-base deficit: 11 mmol/L — ABNORMAL HIGH (ref 0.0–2.0)
BICARBONATE: 15.5 mmol/L — AB (ref 20.0–28.0)
O2 Saturation: 93 %
PCO2 ART: 38.6 mmHg (ref 32.0–48.0)
Patient temperature: 99.4
TCO2: 17 mmol/L (ref 0–100)
pH, Arterial: 7.213 — ABNORMAL LOW (ref 7.350–7.450)
pO2, Arterial: 80 mmHg — ABNORMAL LOW (ref 83.0–108.0)

## 2016-05-14 LAB — BASIC METABOLIC PANEL
ANION GAP: 11 (ref 5–15)
Anion gap: 8 (ref 5–15)
BUN: 30 mg/dL — AB (ref 6–20)
BUN: 35 mg/dL — ABNORMAL HIGH (ref 6–20)
CO2: 18 mmol/L — ABNORMAL LOW (ref 22–32)
CO2: 14 mmol/L — ABNORMAL LOW (ref 22–32)
CREATININE: 2.83 mg/dL — AB (ref 0.61–1.24)
Calcium: 8.5 mg/dL — ABNORMAL LOW (ref 8.9–10.3)
Calcium: 8.4 mg/dL — ABNORMAL LOW (ref 8.9–10.3)
Chloride: 114 mmol/L — ABNORMAL HIGH (ref 101–111)
Chloride: 114 mmol/L — ABNORMAL HIGH (ref 101–111)
Creatinine, Ser: 3.35 mg/dL — ABNORMAL HIGH (ref 0.61–1.24)
GFR calc Af Amer: 23 mL/min — ABNORMAL LOW (ref >60)
GFR, EST AFRICAN AMERICAN: 19 mL/min — AB (ref >60)
GFR, EST NON AFRICAN AMERICAN: 20 mL/min — AB (ref >60)
GFR, EST NON AFRICAN AMERICAN: 16 mL/min — AB (ref >60)
GLUCOSE: 103 mg/dL — AB (ref 65–99)
Glucose, Bld: 113 mg/dL — ABNORMAL HIGH (ref 65–99)
POTASSIUM: 5.4 mmol/L — AB (ref 3.5–5.1)
Potassium: 4.7 mmol/L (ref 3.5–5.1)
SODIUM: 140 mmol/L (ref 135–145)
SODIUM: 139 mmol/L (ref 135–145)

## 2016-05-14 LAB — BLOOD GAS, ARTERIAL
ACID-BASE DEFICIT: 8.4 mmol/L — AB (ref 0.0–2.0)
BICARBONATE: 17.6 mmol/L — AB (ref 20.0–28.0)
DRAWN BY: 418751
FIO2: 80
MECHVT: 560 mL
O2 Saturation: 97.2 %
PATIENT TEMPERATURE: 98.6
PCO2 ART: 42.4 mmHg (ref 32.0–48.0)
PEEP/CPAP: 5 cmH2O
PO2 ART: 119 mmHg — AB (ref 83.0–108.0)
RATE: 20 resp/min
pH, Arterial: 7.243 — ABNORMAL LOW (ref 7.350–7.450)

## 2016-05-14 LAB — PHOSPHORUS
PHOSPHORUS: 4.3 mg/dL (ref 2.5–4.6)
Phosphorus: 3.6 mg/dL (ref 2.5–4.6)
Phosphorus: 4.9 mg/dL — ABNORMAL HIGH (ref 2.5–4.6)

## 2016-05-14 LAB — MAGNESIUM
MAGNESIUM: 1.7 mg/dL (ref 1.7–2.4)
Magnesium: 1.8 mg/dL (ref 1.7–2.4)
Magnesium: 1.7 mg/dL (ref 1.7–2.4)

## 2016-05-14 LAB — MRSA PCR SCREENING: MRSA by PCR: NEGATIVE

## 2016-05-14 LAB — LACTIC ACID, PLASMA: LACTIC ACID, VENOUS: 3.8 mmol/L — AB (ref 0.5–1.9)

## 2016-05-14 MED ORDER — VITAL HIGH PROTEIN PO LIQD
1000.0000 mL | ORAL | Status: DC
  Administered 2016-05-14: 1000 mL
  Filled 2016-05-14: qty 1000

## 2016-05-14 MED ORDER — PRO-STAT SUGAR FREE PO LIQD
30.0000 mL | Freq: Two times a day (BID) | ORAL | Status: DC
  Administered 2016-05-14 – 2016-05-15 (×2): 30 mL
  Filled 2016-05-14 (×2): qty 30

## 2016-05-14 MED ORDER — INFLUENZA VAC SPLIT QUAD 0.5 ML IM SUSY
0.5000 mL | PREFILLED_SYRINGE | INTRAMUSCULAR | Status: AC
  Administered 2016-05-15: 0.5 mL via INTRAMUSCULAR
  Filled 2016-05-14: qty 0.5

## 2016-05-14 MED ORDER — SODIUM CHLORIDE 0.45 % IV SOLN
INTRAVENOUS | Status: DC
  Administered 2016-05-14 – 2016-05-16 (×3): via INTRAVENOUS

## 2016-05-14 MED ORDER — ORAL CARE MOUTH RINSE
15.0000 mL | Freq: Four times a day (QID) | OROMUCOSAL | Status: DC
  Administered 2016-05-14 – 2016-05-19 (×22): 15 mL via OROMUCOSAL

## 2016-05-14 MED ORDER — FENTANYL BOLUS VIA INFUSION
25.0000 ug | INTRAVENOUS | Status: DC | PRN
  Administered 2016-05-18 (×4): 50 ug via INTRAVENOUS
  Filled 2016-05-14: qty 50

## 2016-05-14 MED ORDER — PNEUMOCOCCAL VAC POLYVALENT 25 MCG/0.5ML IJ INJ
0.5000 mL | INJECTION | INTRAMUSCULAR | Status: AC
  Administered 2016-05-15: 0.5 mL via INTRAMUSCULAR
  Filled 2016-05-14: qty 0.5

## 2016-05-14 MED ORDER — CHLORHEXIDINE GLUCONATE 0.12% ORAL RINSE (MEDLINE KIT)
15.0000 mL | Freq: Two times a day (BID) | OROMUCOSAL | Status: DC
  Administered 2016-05-14 – 2016-05-19 (×12): 15 mL via OROMUCOSAL

## 2016-05-14 MED ORDER — HEPARIN SODIUM (PORCINE) 5000 UNIT/ML IJ SOLN
5000.0000 [IU] | Freq: Three times a day (TID) | INTRAMUSCULAR | Status: DC
  Administered 2016-05-15 – 2016-05-23 (×26): 5000 [IU] via SUBCUTANEOUS
  Filled 2016-05-14 (×26): qty 1

## 2016-05-14 NOTE — Progress Notes (Signed)
eLink Physician-Brief Progress Note Patient Name: Bobby Vega DOB: 05-Apr-1935 MRN: [id number]   Date of Service  05/14/2016  HPI/Events of Note  Nurses still unable to pass Foley or daily Foley catheter. Bladder scan shows 575 mL. Patient with acute on chronic renal failure.   eICU Interventions  1. Checking stat renal ultrasound for possible hydronephrosis from BPH/obstruction 2. Consulted urology for assessment and placement of Foley catheter      Intervention Category Intermediate Interventions: Other:  Tera Partridge 05/14/2016, 1:40 AM

## 2016-05-14 NOTE — H&P (Addendum)
PULMONARY / CRITICAL CARE MEDICINE   Name: Bobby Vega MRN: [id number] DOB: 1934/12/22    ADMISSION DATE:  05/13/2016 CONSULTATION DATE:    REFERRING MD:  EDP  CHIEF COMPLAINT:  Respiratory distress  HISTORY OF PRESENT ILLNESS:   Bobby Vega is a 28M with history of dementia, GERD, HLD, HTN, PVD, prior stroke, prior blood clots, and BPH with urgency, who presents via EMS following syncopal episode. En route patient was confused and had increasing shortness of breath. On arrival to ED patient placed on NRB with sats in the 80s; decision made to intubate. Family (daughter) provides additional history that he had some friends over and was vomiting a lot. He got very pale and they called EMS. His oxygen levels were reportedly a little low, but the patient refused to go with them to the hospital. After EMS left, he had another episode of vomiting and EMS was again called and this time was successful in bringing him in for evaluation. No clear history of syncope. No sick contacts. No known exposures or other complaints related to vomiting. No report of abdominal pain or chest pain. No cough / sputum / fever / chills leading up to this. Head CT is pending. CXR showed bilateral opacities concerning for aspiration. Antibiotics were initiated in the ED with vancomycin and zosyn.  SUBJECTIVE:  reamians on vent  VITAL SIGNS: BP (!) 96/58   Pulse (!) 101   Temp 99.4 F (37.4 C) (Oral)   Resp 20   Ht 5\' 5"  (1.651 m)   Wt 74.8 kg (164 lb 14.5 oz)   SpO2 95%   BMI 27.44 kg/m   HEMODYNAMICS:    VENTILATOR SETTINGS: Vent Mode: PRVC FiO2 (%):  [50 %-100 %] 50 % Set Rate:  [20 bmp] 20 bmp Vt Set:  [500 mL-580 mL] 580 mL PEEP:  [5 cmH20] 5 cmH20 Plateau Pressure:  [13 cmH20-28 cmH20] 24 cmH20  INTAKE / OUTPUT: I/O last 3 completed shifts: In: 2802.9 [I.V.:2062.9; Other:30; IV Piggyback:710] Out: 500 [Urine:500]  PHYSICAL EXAMINATION:  General Well nourished, well developed, intubated   HEENT jvd wnl  Pulmonary Coarse breath   Cardiovascular s1 s2  St wnl  Abdomen Soft, non-tender, distended, positive bowel sounds, no palpable organomegaly or masses. Normoresonant to percussion.  Musculoskeletal Grossly normal.   Lymphatics No cervical, supraclavicular or axillary adenopathy.   Neurologic rass -2, fc well  Skin/Integuement No rash, no cyanosis, no clubbing. No edema.     LABS:  BMET  Recent Labs Lab 05/13/16 1736 05/13/16 1746 05/14/16 0125 05/14/16 0649  NA 135 135 140 139  K 4.3 6.5* 4.7 5.4*  CL 106 108 114* 114*  CO2 17*  --  18* 14*  BUN 32* 52* 30* 35*  CREATININE 3.13* 3.00* 2.83* 3.35*  GLUCOSE 220* 174* 103* 113*    Electrolytes  Recent Labs Lab 05/13/16 1736 05/14/16 0125 05/14/16 0649  CALCIUM 9.0 8.5* 8.4*  MG  --  1.8 1.7  PHOS  --  3.6 4.3    CBC  Recent Labs Lab 05/13/16 1736 05/13/16 1746 05/14/16 0125 05/14/16 0649  WBC 11.1*  --  3.1* 7.0  HGB 14.2 16.3 13.4 11.9*  HCT 42.9 48.0 42.3 38.3*  PLT 205  --  135* 159    Coag's  Recent Labs Lab 05/13/16 1736  INR 1.10    Sepsis Markers  Recent Labs Lab 05/13/16 2119 05/13/16 2359  LATICACIDVEN 4.45* 3.8*    ABG  Recent Labs Lab 05/13/16 0005 05/13/16  1735  PHART 7.243* 7.398  PCO2ART 42.4 28.7*  PO2ART 119* 94.0    Liver Enzymes  Recent Labs Lab 05/13/16 1736  AST 21  ALT 13*  ALKPHOS 97  BILITOT 0.5  ALBUMIN 3.8    Cardiac Enzymes No results for input(s): TROPONINI, PROBNP in the last 168 hours.  Glucose  Recent Labs Lab 05/13/16 2229 05/14/16 0000 05/14/16 0408 05/14/16 0816 05/14/16 1217  GLUCAP 105* 119* 102* 125* 93    Imaging Ct Head Wo Contrast  Result Date: 05/13/2016 CLINICAL DATA:  Syncopal episode with confusion.  Intubation. EXAM: CT HEAD WITHOUT CONTRAST TECHNIQUE: Contiguous axial images were obtained from the base of the skull through the vertex without intravenous contrast. COMPARISON:  CT head 05/15/2013.   MR head 05/17/2013. FINDINGS: Brain: No evidence for acute infarction, hemorrhage, mass lesion, hydrocephalus, or extra-axial fluid. Atrophy with chronic microvascular ischemic change. Chronic LEFT parietal and LEFT cerebellar infarcts reflect the findings on previous imaging. Vascular: Mild vascular calcification.  No hyperdense vessel. Skull: Normal. Negative for fracture or focal lesion. Sinuses/Orbits: No acute finding. Other: None. IMPRESSION: Atrophy and small vessel disease. Remote LEFT cerebral and cerebellar infarct. No acute intracranial findings. Electronically Signed   By: Staci Righter M.D.   On: 05/13/2016 20:59   US Renal Port  Result Date: 05/14/2016 CLINICAL DATA:  Acute on chronic renal failure EXAM: RENAL / URINARY TRACT ULTRASOUND COMPLETE COMPARISON:  None. FINDINGS: Right Kidney: Length: 11.5 cm. No hydronephrosis. No calculi. Two cysts, measuring up to 1.6 cm. Increased echogenicity of the renal parenchyma. Left Kidney: Length: 11.4 cm. Lower pole cyst measuring 1.6 cm. Increased echogenicity of the renal parenchyma. Bladder: Decompressed around a Foley catheter. IMPRESSION: No hydronephrosis. There is increased echogenicity of renal parenchyma suggesting medical renal disease. Electronically Signed   By: Andreas Newport M.D.   On: 05/14/2016 06:08   Dg Chest Portable 1 View  Result Date: 05/13/2016 CLINICAL DATA:  Endotracheal tube and nasogastric tube placement. EXAM: PORTABLE CHEST 1 VIEW COMPARISON:  05/13/2016 at 1730 hours. FINDINGS: Endotracheal tube terminates approximately 4.8 cm above the carina. Nasogastric tube is followed into the stomach. Trachea is midline. Heart size normal. Patchy airspace opacification is again seen in the left perihilar region with minimal involvement of the right lower lobe. No pleural fluid. IMPRESSION: 1. Satisfactory endotracheal tube and nasogastric tube placement. 2. Patchy bilateral airspace opacification, left greater than right,  possibly due to pneumonia. Electronically Signed   By: Lorin Picket M.D.   On: 05/13/2016 18:12   Dg Chest Portable 1 View  Result Date: 05/13/2016 CLINICAL DATA:  Syncope and confusion EXAM: PORTABLE CHEST 1 VIEW COMPARISON:  None. FINDINGS: There is patchy airspace opacity throughout the left mid lower lung zone as well as in the right base. Heart size and pulmonary vascularity are normal. No adenopathy. No bone lesions. IMPRESSION: Multifocal opacities. Suspect either multifocal pneumonia or aspiration as most likely etiologies. Heart size within normal limits. No appreciable adenopathy. Electronically Signed   By: Lowella Grip III M.D.   On: 05/13/2016 17:41     STUDIES:  CXR, head CT as above  CULTURES: Blood, urine pending  ANTIBIOTICS: Vancomycin 11/18 >> Zosyn 11/18 >>  SIGNIFICANT EVENTS: Intubation in ED  LINES/TUBES: ETT 11/18 >> PIV  DISCUSSION: Mr. Nixon is a 57M with PMH of dementia, PVD, prior stroke, prior blood clots, HTN, HLD, GERD, who presents with acute hypoxemic respiratory failure thought secondary to massive aspiration. The etiology of his nausea, vomiting and questionable  syncope is as yet unclear. No further vomiting was reported in the ED. He is being covered with   ASSESSMENT / PLAN:  PULMONARY A: Acute hypoxemic respiratory failure, thought 2/2 aspiration P:   ABG last reviewed, repeat abg to esnure resolution acidosis pcxr in am , noted diffuse infiltrate left greater rt No sbt until correction ph May need higher MV  CARDIOVASCULAR A:  Sinus tachycardia Borderline BP P:  Monitor Consider low dose BB as BP tolerates Allow pos balance  RENAL A:   Acute kidney injury w/ Cr. 3.13. (baseline unclear, but likely <2), r./o obstructive uropathy , foley now placed, vs atn Metabolic acidosis  P:   Would continued fluids [place] neg Foley by urology, appreciated Check lactate down Avoid nephrotoxins  GASTROINTESTINAL A:   Nausea and  vomiting - unclear etiology - resolved P:   NPO Famotidine iv for gi ppx Prn antiemetics statr feeds  HEMATOLOGIC A:   Leukocytosis P:  Trend CBC Ensure sub q hep Dc lovenox in this 50 yr ld with crt 3.5  INFECTIOUS A:   Possible evolving pneumonia 2/2 aspiration P:   Continue antibiotics (vanc/zosyn for now) Narrow in am if no mrsa or pseudomonas  ENDOCRINE A:   Hyperglycemia   P:  CBGs and SSI  NEUROLOGIC A:   Altered mental status - likely toxic metabolic encephalopathy- resolved P:   RASS goal: 0 to -1 Wua, fent Dc versed   FAMILY  - Updates: daughter at bedside. She and her brother think he would want to be a FULL CODE for now, but also acknowledge that he would not want to be sustained on life support indefinitely.   - Inter-disciplinary family meet or Palliative Care meeting due by:  day 7  Ccm time 30 min   Lavon Paganini. Titus Mould, MD, [place] Pgr: [facility]

## 2016-05-14 NOTE — Consult Note (Signed)
Urology Consult  CC: Referring physician: Dr. Merrie Roof Reason for referral: foley catheter placement  Procedure: The patient's genitalia was sterilely prepped with Betadine and draped with sterile towels after his condom catheter had been removed.  I used a 61 French rigid cystoscope with 30 lens and passed this down the urethra under direct vision.  I noted no urethral abnormalities but a bulbar urethral stricture was identified.  There were no past passages or active bleeding.  The stricture appeared to be moderate and only a few millimeters wide.  I passed a 0.038 inch Sensor guidewire through the cystoscope and then through the stricture under direct visualization without resistance.  I then left this in place and removed the cystoscope. A UroMax dilating balloon was then passed over the guidewire and across the stricture.  It was gently inflated to 10 atm, held for approximately 60 seconds and then deflated.  I removed the balloon and passed a 3 French council-tipped catheter over the guidewire and into the bladder with minimal resistance.  The balloon was filled with 10 cc of sterile water and clamped to closed system drainage.  The urine return was clear.  The patient tolerated the procedure well with no complications occurring.  Impression/Assessment:  1.  History of BPH with urinary retention in the past - in 2/13 he developed urinary retention but had a Foley catheter placed without difficulty indicating there was no stricture at that time.  He passed his voiding trial and has been voiding prior to his admission however it appears he has developed a bulbar urethral stricture.  2.  Bulbar urethral stricture - He was found cystoscopically to have a moderate-severe bulbar urethral stricture at the time of cystoscopy.  This had been dilated with a balloon as atraumatically as possible.  He now has a Foley catheter indwelling.  This will need to remain indwelling while being used to monitor  urine output and can be removed for a voiding trial prior to the patient's discharge.  At this point, with his stricture having been adequately treated, he will follow-up with me if he has further voiding difficulty.   Plan:  1.  Maintain Foley catheter until no longer needed to monitor urine output. 2. When no longer needed the cath may be removed with little risk of further difficulty since his stricture has now been adequately dilated.  Please contact me if further urologic assistance is necessary.  I will otherwise see him on an as-needed basis.    History of Present Illness: Bobby Vega is a 81M with history of dementia, GERD, HLD, HTN, PVD, prior stroke, prior blood clots, and BPH with urgency, who presents via EMS following syncopal episode. En route patient was confused and had increasing shortness of breath. On arrival to ED patient placed on NRB with sats in the 80s; decision made to intubate. Family (daughter) provides additional history that he had some friends over and was vomiting a lot. He got very pale and they called EMS. His oxygen levels were reportedly a little low, but the patient refused to go with them to the hospital. After EMS left, he had another episode of vomiting and EMS was again called and this time was successful in bringing him in for evaluation. No clear history of syncope. No sick contacts. No known exposures or other complaints related to vomiting. No report of abdominal pain or chest pain. No cough / sputum / fever / chills leading up to this. Head CT is pending. CXR showed  bilateral opacities concerning for aspiration. Antibiotics were initiated in the ED with vancomycin and zosyn.  Attempts at placing a Foley catheter while in the emergency room and again in the ICU were unsuccessful despite the use of a coud catheter.  I was contacted after it was found that he had 500 cc in his bladder by bladder scan ultrasound and in addition had an elevated creatinine. The  patient is currently intubated and is unable to offer any history.  There are no family members available.  Review of the chart indicates a history of BPH with urinary frequency.  He has a history of BPH and developed urinary retention after vascular surgery in 2/13.  He was seen in our office on 08/01/11 and at that time had a Foley catheter indwelling indicating the absence of any stricture.  He underwent a voiding trial was successful at that time.  I have been asked to place a Foley catheter due to difficult Foley catheter placement.  Past Medical History:  Diagnosis Date  . Arthritis    hip  . Borderline diabetes   . BPH (benign prostatic hyperplasia)    takes Avodart daily  . Bronchitis    yrs ago  . Cervical spondylosis with myelopathy   . Cervical spondylosis without myelopathy   . Chronic back pain   . Constipation    Metamucil prn  . Diverticulosis   . ED (erectile dysfunction)    takes Cialis as needed as well as Viagra  . Embolism - blood clot    in stomach  . GERD (gastroesophageal reflux disease)    takes Nexium daily  . Hyperlipidemia    takes Zocor nightly  . Hypertension    takes Losartan daily  . Lumbar spondylosis with myelopathy   . Lumbar spondylosis with myelopathy   . Nocturia   . Pain in joint, hand   . Peripheral vascular disease ([place])   . Stroke ([place])   . Urinary urgency    Past Surgical History:  Procedure Laterality Date  . ABDOMINAL AORTAGRAM N/A 07/04/2011   Procedure: ABDOMINAL Maxcine Ham;  Surgeon: Serafina Mitchell, MD;  Location[facility] C[facility] LAB;  Service: Cardiovascular;  Laterality: N/A;  . AORTA - BILATERAL FEMORAL ARTERY BYPASS GRAFT  07/13/2011   Procedure: AORTA BIFEMORAL BYPASS GRAFT;  Surgeon: Theotis Burrow, MD;  Location: [place];  Service: Vascular;  Laterality: Bilateral;  . CYSTOSCOPY  07/13/2011   Procedure: CYSTOSCOPY;  Surgeon: Hanley Ben, MD;  Location: [place];  Service: Urology;  Laterality: N/A;  cystoscopy,dilatation &  insertion of councill foley catheter  . LOWER EXTREMITY ANGIOGRAM Bilateral 07/04/2011   Procedure: LOWER EXTREMITY ANGIOGRAM;  Surgeon: Serafina Mitchell, MD;  Location[facility] C[facility] LAB;  Service: Cardiovascular;  Laterality: Bilateral;  . SKIN GRAFT  1983    Medications:  Scheduled: . chlorhexidine gluconate (MEDLINE KIT)  15 mL Mouth Rinse BID  . enoxaparin (LOVENOX) injection  30 mg Subcutaneous Q24H  . famotidine (PEPCID) IV  20 mg Intravenous Q24H  . [START ON 05/15/2016] Influenza vac split quadrivalent PF  0.5 mL Intramuscular Tomorrow-1000  . insulin aspart  2-6 Units Subcutaneous Q4H  . mouth rinse  15 mL Mouth Rinse QID  . piperacillin-tazobactam (ZOSYN)  IV  2.25 g Intravenous Q8H  . [START ON 05/15/2016] pneumococcal 23 valent vaccine  0.5 mL Intramuscular Tomorrow-1000  . vancomycin  750 mg Intravenous Q24H   Continuous: . fentaNYL infusion INTRAVENOUS 75 mcg/hr (05/14/16 0500)  . propofol (DIPRIVAN) infusion Stopped (05/13/16  1920)    Allergies: No Known Allergies  Family History  Problem Relation Age of Onset  . Heart disease Mother     Before age 45  . Heart disease Brother     NOT  before age 22  . Heart attack Brother   . Anesthesia problems Neg Hx   . Hypotension Neg Hx   . Malignant hyperthermia Neg Hx   . Pseudochol deficiency Neg Hx     Social History:  reports that he has been smoking Cigars.  He has smoked for the past 0.50 years. He has never used smokeless tobacco. He reports that he does not drink alcohol or use drugs.  Review of Systems (10 point): A comprehensive review of systems was unobtainable due to the fact the patient is intubated.  Physical Exam:  Vital signs in last 24 hours: Temp:  [98.8 F (37.1 C)-100.3 F (37.9 C)] 99.3 F (37.4 C) (11/19 0814) Pulse Rate:  [43-128] 51 (11/19 0800) Resp:  [13-32] 18 (11/19 0800) BP: (76-199)/(34-103) 110/40 (11/19 0800) SpO2:  [88 %-100 %] 97 % (11/19 0800) FiO2 (%):  [50 %-100 %] 50 % (11/19  0800) Weight:  [154 lb 15.7 oz (70.3 kg)-164 lb 14.5 oz (74.8 kg)] 164 lb 14.5 oz (74.8 kg) (11/19 0600) General appearance: intubated and appears stated age Head: Normocephalic, without obvious abnormality, atraumatic Eyes: conjunctivae/corneas clear. EOM's intact.  Oropharynx: dry mucous membranes with endotracheai tube in place Neck: supple, symmetrical, trachea midline Resp: normal respiratory effort on the ventilator. Cardio: regular rate and rhythm Back: symmetric, no curvature. ROM normal. No CVA tenderness. GI: soft, non-tender; bowel sounds normal; no masses,  no organomegaly Male genitalia: penis: normal uncircumcised male phallus with no lesions with a condom catheter on and clear urine noted.Testes: bilaterally descended with no masses or tenderness.  Extremities: extremities normal, atraumatic, no cyanosis or edema Skin: Skin color normal. No visible rashes or lesions Neurologic: Grossly normal although somewhat difficult to arouse and sedated.  Laboratory Data:   Recent Labs  05/13/16 1736 05/13/16 1746 05/14/16 0125 05/14/16 0649  WBC 11.1*  --  3.1* 7.0  HGB 14.2 16.3 13.4 11.9*  HCT 42.9 48.0 42.3 38.3*   BMET  Recent Labs  05/14/16 0125 05/14/16 0649  NA 140 139  K 4.7 5.4*  CL 114* 114*  CO2 18* 14*  GLUCOSE 103* 113*  BUN 30* 35*  CREATININE 2.83* 3.35*  CALCIUM 8.5* 8.4*    Recent Labs  05/13/16 1736  INR 1.10   No results for input(s): LABURIN in the last 72 hours. Results for orders placed or performed during the hospital encounter of 05/13/16  MRSA PCR Screening     Status: None   Collection Time: 05/13/16 11:54 PM  Result Value Ref Range Status   MRSA by PCR NEGATIVE NEGATIVE Final    Comment:        The GeneXpert MRSA Assay (FDA approved for NASAL specimens only), is one component of a comprehensive MRSA colonization surveillance program. It is not intended to diagnose MRSA infection nor to guide or monitor treatment for MRSA  infections.    Creatinine:  Recent Labs  05/13/16 1736 05/13/16 1746 05/14/16 0125 05/14/16 0649  CREATININE 3.13* 3.00* 2.83* 3.35*    Imaging: Ct Head Wo Contrast  Result Date: 05/13/2016 CLINICAL DATA:  Syncopal episode with confusion.  Intubation. EXAM: CT HEAD WITHOUT CONTRAST TECHNIQUE: Contiguous axial images were obtained from the base of the skull through the vertex without intravenous contrast. COMPARISON:  CT head 05/15/2013.  MR head 05/17/2013. FINDINGS: Brain: No evidence for acute infarction, hemorrhage, mass lesion, hydrocephalus, or extra-axial fluid. Atrophy with chronic microvascular ischemic change. Chronic LEFT parietal and LEFT cerebellar infarcts reflect the findings on previous imaging. Vascular: Mild vascular calcification.  No hyperdense vessel. Skull: Normal. Negative for fracture or focal lesion. Sinuses/Orbits: No acute finding. Other: None. IMPRESSION: Atrophy and small vessel disease. Remote LEFT cerebral and cerebellar infarct. No acute intracranial findings. Electronically Signed   By: Staci Righter M.D.   On: 05/13/2016 20:59   US Renal Port  Result Date: 05/14/2016 CLINICAL DATA:  Acute on chronic renal failure EXAM: RENAL / URINARY TRACT ULTRASOUND COMPLETE COMPARISON:  None. FINDINGS: Right Kidney: Length: 11.5 cm. No hydronephrosis. No calculi. Two cysts, measuring up to 1.6 cm. Increased echogenicity of the renal parenchyma. Left Kidney: Length: 11.4 cm. Lower pole cyst measuring 1.6 cm. Increased echogenicity of the renal parenchyma. Bladder: Decompressed around a Foley catheter. IMPRESSION: No hydronephrosis. There is increased echogenicity of renal parenchyma suggesting medical renal disease. Electronically Signed   By: Andreas Newport M.D.   On: 05/14/2016 06:08   Dg Chest Portable 1 View  Result Date: 05/13/2016 CLINICAL DATA:  Endotracheal tube and nasogastric tube placement. EXAM: PORTABLE CHEST 1 VIEW COMPARISON:  05/13/2016 at 1730 hours.  FINDINGS: Endotracheal tube terminates approximately 4.8 cm above the carina. Nasogastric tube is followed into the stomach. Trachea is midline. Heart size normal. Patchy airspace opacification is again seen in the left perihilar region with minimal involvement of the right lower lobe. No pleural fluid. IMPRESSION: 1. Satisfactory endotracheal tube and nasogastric tube placement. 2. Patchy bilateral airspace opacification, left greater than right, possibly due to pneumonia. Electronically Signed   By: Lorin Picket M.D.   On: 05/13/2016 18:12   Dg Chest Portable 1 View  Result Date: 05/13/2016 CLINICAL DATA:  Syncope and confusion EXAM: PORTABLE CHEST 1 VIEW COMPARISON:  None. FINDINGS: There is patchy airspace opacity throughout the left mid lower lung zone as well as in the right base. Heart size and pulmonary vascularity are normal. No adenopathy. No bone lesions. IMPRESSION: Multifocal opacities. Suspect either multifocal pneumonia or aspiration as most likely etiologies. Heart size within normal limits. No appreciable adenopathy. Electronically Signed   By: Lowella Grip III M.D.   On: 05/13/2016 17:41       Tyus Kallam C 05/14/2016, 9:52 AM

## 2016-05-15 ENCOUNTER — Inpatient Hospital Stay (HOSPITAL_COMMUNITY): Payer: Medicare HMO

## 2016-05-15 DIAGNOSIS — N189 Chronic kidney disease, unspecified: Secondary | ICD-10-CM

## 2016-05-15 DIAGNOSIS — N179 Acute kidney failure, unspecified: Secondary | ICD-10-CM

## 2016-05-15 LAB — CBC WITH DIFFERENTIAL/PLATELET
BASOS PCT: 0 %
Basophils Absolute: 0 10*3/uL (ref 0.0–0.1)
EOS PCT: 0 %
Eosinophils Absolute: 0 10*3/uL (ref 0.0–0.7)
HEMATOCRIT: 33.8 % — AB (ref 39.0–52.0)
HEMOGLOBIN: 11.1 g/dL — AB (ref 13.0–17.0)
LYMPHS PCT: 9 %
Lymphs Abs: 1.1 10*3/uL (ref 0.7–4.0)
MCH: 31.4 pg (ref 26.0–34.0)
MCHC: 32.8 g/dL (ref 30.0–36.0)
MCV: 95.5 fL (ref 78.0–100.0)
MONOS PCT: 3 %
Monocytes Absolute: 0.4 10*3/uL (ref 0.1–1.0)
NEUTROS ABS: 10.2 10*3/uL — AB (ref 1.7–7.7)
Neutrophils Relative %: 88 %
Platelets: 130 10*3/uL — ABNORMAL LOW (ref 150–400)
RBC: 3.54 MIL/uL — ABNORMAL LOW (ref 4.22–5.81)
RDW: 15.5 % (ref 11.5–15.5)
WBC MORPHOLOGY: INCREASED
WBC: 11.7 10*3/uL — ABNORMAL HIGH (ref 4.0–10.5)

## 2016-05-15 LAB — GLUCOSE, CAPILLARY
GLUCOSE-CAPILLARY: 105 mg/dL — AB (ref 65–99)
GLUCOSE-CAPILLARY: 114 mg/dL — AB (ref 65–99)
GLUCOSE-CAPILLARY: 119 mg/dL — AB (ref 65–99)
GLUCOSE-CAPILLARY: 119 mg/dL — AB (ref 65–99)
GLUCOSE-CAPILLARY: 125 mg/dL — AB (ref 65–99)
GLUCOSE-CAPILLARY: 128 mg/dL — AB (ref 65–99)
Glucose-Capillary: 112 mg/dL — ABNORMAL HIGH (ref 65–99)

## 2016-05-15 LAB — PHOSPHORUS
PHOSPHORUS: 4.6 mg/dL (ref 2.5–4.6)
Phosphorus: 3.1 mg/dL (ref 2.5–4.6)

## 2016-05-15 LAB — COMPREHENSIVE METABOLIC PANEL
ALBUMIN: 2.4 g/dL — AB (ref 3.5–5.0)
ALK PHOS: 53 U/L (ref 38–126)
ALT: 89 U/L — ABNORMAL HIGH (ref 17–63)
ANION GAP: 9 (ref 5–15)
AST: 139 U/L — ABNORMAL HIGH (ref 15–41)
BUN: 53 mg/dL — ABNORMAL HIGH (ref 6–20)
CALCIUM: 7.7 mg/dL — AB (ref 8.9–10.3)
CHLORIDE: 110 mmol/L (ref 101–111)
CO2: 16 mmol/L — AB (ref 22–32)
Creatinine, Ser: 3.59 mg/dL — ABNORMAL HIGH (ref 0.61–1.24)
GFR calc Af Amer: 17 mL/min — ABNORMAL LOW (ref >60)
GFR calc non Af Amer: 15 mL/min — ABNORMAL LOW (ref >60)
GLUCOSE: 121 mg/dL — AB (ref 65–99)
Potassium: 5.5 mmol/L — ABNORMAL HIGH (ref 3.5–5.1)
SODIUM: 135 mmol/L (ref 135–145)
Total Bilirubin: 0.6 mg/dL (ref 0.3–1.2)
Total Protein: 5.5 g/dL — ABNORMAL LOW (ref 6.5–8.1)

## 2016-05-15 LAB — BASIC METABOLIC PANEL
Anion gap: 11 (ref 5–15)
BUN: 59 mg/dL — AB (ref 6–20)
CALCIUM: 8.3 mg/dL — AB (ref 8.9–10.3)
CO2: 15 mmol/L — AB (ref 22–32)
CREATININE: 3.68 mg/dL — AB (ref 0.61–1.24)
Chloride: 118 mmol/L — ABNORMAL HIGH (ref 101–111)
GFR calc Af Amer: 17 mL/min — ABNORMAL LOW (ref >60)
GFR, EST NON AFRICAN AMERICAN: 14 mL/min — AB (ref >60)
GLUCOSE: 137 mg/dL — AB (ref 65–99)
Potassium: 4.2 mmol/L (ref 3.5–5.1)
Sodium: 144 mmol/L (ref 135–145)

## 2016-05-15 LAB — POCT I-STAT 3, ART BLOOD GAS (G3+)
ACID-BASE DEFICIT: 11 mmol/L — AB (ref 0.0–2.0)
Bicarbonate: 14.9 mmol/L — ABNORMAL LOW (ref 20.0–28.0)
O2 Saturation: 94 %
PH ART: 7.287 — AB (ref 7.350–7.450)
TCO2: 16 mmol/L (ref 0–100)
pCO2 arterial: 31.2 mmHg — ABNORMAL LOW (ref 32.0–48.0)
pO2, Arterial: 79 mmHg — ABNORMAL LOW (ref 83.0–108.0)

## 2016-05-15 LAB — CK: CK TOTAL: 1847 U/L — AB (ref 49–397)

## 2016-05-15 LAB — PROCALCITONIN

## 2016-05-15 LAB — MAGNESIUM
Magnesium: 1.7 mg/dL (ref 1.7–2.4)
Magnesium: 2.5 mg/dL — ABNORMAL HIGH (ref 1.7–2.4)

## 2016-05-15 LAB — LACTIC ACID, PLASMA
Lactic Acid, Venous: 2.8 mmol/L (ref 0.5–1.9)
Lactic Acid, Venous: 2.3 mmol/L (ref 0.5–1.9)

## 2016-05-15 LAB — TSH: TSH: 4.469 u[IU]/mL (ref 0.350–4.500)

## 2016-05-15 LAB — VANCOMYCIN, TROUGH: Vancomycin Tr: 20 ug/mL (ref 15–20)

## 2016-05-15 MED ORDER — AZITHROMYCIN 200 MG/5ML PO SUSR
500.0000 mg | Freq: Every day | ORAL | Status: AC
  Administered 2016-05-15 – 2016-05-17 (×3): 500 mg
  Filled 2016-05-15 (×3): qty 15

## 2016-05-15 MED ORDER — SODIUM POLYSTYRENE SULFONATE 15 GM/60ML PO SUSP
30.0000 g | Freq: Once | ORAL | Status: AC
  Administered 2016-05-15: 30 g
  Filled 2016-05-15: qty 120

## 2016-05-15 MED ORDER — WHITE PETROLATUM GEL
Status: AC
  Filled 2016-05-15: qty 1

## 2016-05-15 MED ORDER — MAGNESIUM SULFATE 2 GM/50ML IV SOLN
2.0000 g | Freq: Once | INTRAVENOUS | Status: AC
  Administered 2016-05-15: 2 g via INTRAVENOUS
  Filled 2016-05-15: qty 50

## 2016-05-15 MED ORDER — VANCOMYCIN HCL IN DEXTROSE 750-5 MG/150ML-% IV SOLN
750.0000 mg | INTRAVENOUS | Status: DC
  Filled 2016-05-15: qty 150

## 2016-05-15 MED ORDER — VITAL AF 1.2 CAL PO LIQD
1000.0000 mL | ORAL | Status: DC
  Administered 2016-05-15 – 2016-05-19 (×5): 1000 mL

## 2016-05-15 NOTE — Progress Notes (Signed)
Pharmacy Antibiotic Note  Bobby Vega is a 81 y.o. male who continues on antibiotics for pneumonia. Vancomycin trough drawn this evening is 20, upper end of goal but drawn 40 minutes early. This level was also obtained after just 2 doses given. With worsening renal function, suspect he will accumulate so will increase the dosing interval.  Vancomycin trough goal 15-20  Plan: 1) Change vancomycin to 750mg  IV q48  Height: 5\' 5"  (165.1 cm) Weight: 166 lb 3.6 oz (75.4 kg) IBW/kg (Calculated) : 61.5  Temp (24hrs), Avg:99 F (37.2 C), Min:97.8 F (36.6 C), Max:99.8 F (37.7 C)   Recent Labs Lab 05/13/16 1736 05/13/16 1746 05/13/16 2119 05/13/16 2359 05/14/16 0125 05/14/16 0649 05/15/16 0431 05/15/16 0722 05/15/16 0934 05/15/16 1650  WBC 11.1*  --   --   --  3.1* 7.0 11.7*  --   --   --   CREATININE 3.13* 3.00*  --   --  2.83* 3.35* 3.59*  --   --  3.68*  LATICACIDVEN  --   --  4.45* 3.8*  --   --   --  2.8* 2.3*  --   VANCOTROUGH  --   --   --   --   --   --   --   --   --  20    Estimated Creatinine Clearance: 15.4 mL/min (by C-G formula based on SCr of 3.68 mg/dL (H)).    No Known Allergies  Antimicrobials this admission: Vancomycin 11/18 >>  Zosyn 11/18 >>  Azith 11/20>> 3 days  Dose adjustments this admission: VT = 20 on 750mg  q24 >> change to 750mg  q48  Microbiology results: 11/18 MRSA PCR negative 11/20 blood x2 >> 11/20 resp >>  Thank you for allowing pharmacy to be a part of this patient's care.  Deboraha Sprang 05/15/2016 6:25 PM

## 2016-05-15 NOTE — Addendum Note (Signed)
Addended by: Lianne Cure A on: 05/15/2016 08:45 AM   Modules accepted: Orders

## 2016-05-15 NOTE — Progress Notes (Signed)
PULMONARY / CRITICAL CARE MEDICINE   Name: Bobby Vega MRN: [id number] DOB: May 23, 1935    ADMISSION DATE:  05/13/2016 CONSULTATION DATE:    REFERRING MD:  EDP  CHIEF COMPLAINT:  Respiratory distress  Brief:   Bobby Vega is a 64M with history of dementia, GERD, HLD, HTN, PVD, prior stroke, prior blood clots, and BPH with urgency, who presents via EMS following syncopal episode. En route patient was confused and had increasing shortness of breath. On arrival to ED patient placed on NRB with sats in the 80s; decision made to intubate. Family (daughter) provides additional history that he had some friends over and was vomiting a lot. He got very pale and they called EMS. His oxygen levels were reportedly a little low, but the patient refused to go with them to the hospital. After EMS left, he had another episode of vomiting and EMS was again called and this time was successful in bringing him in for evaluation. No clear history of syncope. No sick contacts. No known exposures or other complaints related to vomiting. No report of abdominal pain or chest pain. No cough / sputum / fever / chills leading up to this. Head CT is pending. CXR showed bilateral opacities concerning for aspiration. Antibiotics were initiated in the ED with vancomycin and zosyn.   Imaging No results found.   STUDIES:  11/18 CXR bilateral opacities concerning for asp pneumonia   11/18  head CT without acute intracranial finding   11/20 CXR with persistent bilateral opacitities  CULTURES: None   ANTIBIOTICS: Vancomycin 11/18 >> Zosyn 11/18 >>  SIGNIFICANT EVENTS: Intubation in ED  LINES/TUBES: ETT 11/18 >> 2PIV 11/18>> Foley 11/18>> OGT 11/18>>  SUBJECTIVE:  No acute event overnight. He is awake but appears confused. Intermittently follows command.   VITAL SIGNS: BP 128/68 (BP Location: Right Arm)   Pulse (!) 107   Temp 98.1 F (36.7 C) (Oral)   Resp (!) 30   Ht 5\' 5"  (1.651 m)   Wt 166 lb 3.6  oz (75.4 kg)   SpO2 98%   BMI 27.66 kg/m   HEMODYNAMICS:  Not on pressors  VENTILATOR SETTINGS: Vent Mode: PRVC FiO2 (%):  [40 %-50 %] 40 % Set Rate:  [20 bmp-30 bmp] 30 bmp Vt Set:  [580 mL] 580 mL PEEP:  [5 cmH20] 5 cmH20 Plateau Pressure:  [17 cmH20-29 cmH20] 29 cmH20  INTAKE / OUTPUT: I/O last 3 completed shifts: In: 3092.9 [I.V.:2122.9; Other:60; IV Piggyback:910] Out: 630 [Urine:630]  PHYSICAL EXAMINATION:  GEN: awake, intermittently follows command HEENT: arcus seniles CVS: RRR, normal s1 and s2, no murmurs, no edema RESP: on ETT, no wheeze or crackles GI: Bowel sounds present, soft MSK:no focal tenderness or swelling SKIN: no apparent skin lesion NEURO: awake, intermittently follows command, PERRL, not able to illicit further neuro due to poor cooperation.  PSYCH: appropriate mood and affect   LABS:  BMET  Recent Labs Lab 05/14/16 0125 05/14/16 0649 05/15/16 0431  NA 140 139 135  K 4.7 5.4* 5.5*  CL 114* 114* 110  CO2 18* 14* 16*  BUN 30* 35* 53*  CREATININE 2.83* 3.35* 3.59*  GLUCOSE 103* 113* 121*    Electrolytes  Recent Labs Lab 05/14/16 0125 05/14/16 0649 05/14/16 1628 05/15/16 0431  CALCIUM 8.5* 8.4*  --  7.7*  MG 1.8 1.7 1.7 1.7  PHOS 3.6 4.3 4.9* 4.6    CBC  Recent Labs Lab 05/14/16 0125 05/14/16 0649 05/15/16 0431  WBC 3.1* 7.0 11.7*  HGB 13.4  11.9* 11.1*  HCT 42.3 38.3* 33.8*  PLT 135* 159 130*    Coag's  Recent Labs Lab 05/13/16 1736  INR 1.10    Sepsis Markers  Recent Labs Lab 05/13/16 2119 05/13/16 2359  LATICACIDVEN 4.45* 3.8*    ABG  Recent Labs Lab 05/13/16 1735 05/14/16 1628 05/15/16 0346  PHART 7.398 7.213* 7.287*  PCO2ART 28.7* 38.6 31.2*  PO2ART 94.0 80.0* 79.0*    Liver Enzymes  Recent Labs Lab 05/13/16 1736 05/15/16 0431  AST 21 139*  ALT 13* 89*  ALKPHOS 97 53  BILITOT 0.5 0.6  ALBUMIN 3.8 2.4*    Cardiac Enzymes No results for input(s): TROPONINI, PROBNP in the last  168 hours.  Glucose  Recent Labs Lab 05/14/16 0408 05/14/16 0816 05/14/16 1217 05/14/16 2008 05/15/16 0010 05/15/16 0418  GLUCAP 102* 125* 93 100* 105* 114*    DISCUSSION: Bobby Vega is a 38M with PMH of dementia, PVD, prior stroke, prior blood clots, HTN, HLD, GERD, who presents with acute hypoxemic respiratory failure thought secondary to massive aspiration of unknown etiology being covered with vanc and zosyn.   ASSESSMENT / PLAN:  PULMONARY A: Acute hypoxemic respiratory failure, thought 2/2 aspiration pna P:   ABG last reviewed, pure metabolic acidosis with mild AG CXR with diffuse infiltrate left > rt AM CXR No sbt until correction pH Continue Vanc and Zosyn. May need coverage for atypical  CARDIOVASCULAR A:  Sinus tachycardia Borderline BP P:  Monitor Consider low dose BB as BP tolerates Allow pos balance  RENAL A:   AKI w/ Cr. 3.13>>3.59. (unclear b/l likely <2). Foley placed by urology. Renal US negative I&O 0.6/+1.2/+3.5 HyperK/HypoMg Metabolic acidosis with hyperK. ?RTA-4 P:   Would continue 0.45 NS Replete 2gm Mg Check lactate down Avoid nephrotoxins Kayexlate if K trends up  GASTROINTESTINAL A:   New transaminitis ?EtOH ?Shock liver ?Rhabdo Nausea and vomiting - unclear etiology - resolved P:   CK Continue TF Famotidine iv for gi ppx Prn antiemetics  HEMATOLOGIC A:   Leukocytosis  Anemia of Chronic disease P:  Trend CBC Sub q hep  INFECTIOUS A:   Possible evolving pneumonia 2/2 aspiration P:   Continue antibiotics (vanc/zosyn) Consider atypical coverage Trend lactate  ENDOCRINE A:   Hyperglycemia   P:  CBGs and SSI  NEUROLOGIC A:   Altered mental status - likely toxic metabolic encephalopathy- resolved CT head negative P:   RASS goal: 0 to -1 Wua, fent  FAMILY  - Updates: no family at bedside  - Inter-disciplinary family meet or Palliative Care meeting due by:  day 7   ATTENDING NOTE / ATTESTATION NOTE :    I have discussed the case with the resident/APP  Wendee Beavers  I agree with the resident/APP's  history, physical examination, assessment, and plans.    I have edited the above note and modified it according to our agreed history, physical examination, assessment and plan.   Briefly, pt admitted with acute hypoxemic resp failure 2/2 asp pna. With sepsis, lactic acidosis, metabolic acidosis. He is volume resuscitated. Lactate getting better. Cultures remain (-) so far.  PE as above. VSS. (-) fever. Sedated, intubated. Some crackles at bases. Trace edema.   Acute hypoxemic resp fx 2/2 asp pna lingula and LLL and RLL. Cont vent support. Send cultures. PST. WUA. Check PCT.  AKI 2/2 sepsis. Hyper K. Needs kayexalate. Making urine. Observe. Hopefully, no HD. Dec IVF to 50 mls/hr.  Metabolic and lactic acidosis. Getting better.   I have  spent 30 minutes of critical care time with this patient today.  Family :  No family at bedside.    Monica Becton, MD 05/15/2016, 10:23 AM [facility] Pager [phone] After 3 pm or if no answer, call [phone]

## 2016-05-15 NOTE — Plan of Care (Signed)
Problem: Health Behavior/Discharge Planning: Goal: Ability to manage health-related needs will improve Outcome: Not Met (add Reason) Pt still intubated.

## 2016-05-15 NOTE — Progress Notes (Signed)
CRITICAL VALUE ALERT  Critical value received:  Lactic acid 2.8  Date of notification:  05/15/16  Time of notification:  0808  Critical value read back:Yes.    Nurse who received alert:  Dorena Cookey  MD notified (1st page): Cyndia Skeeters  Time of first page:  0815  MD notified (2nd page):  Time of second page:  Responding MD:  Cyndia Skeeters  Time MD responded:  [phone]

## 2016-05-15 NOTE — Progress Notes (Signed)
Initial Nutrition Assessment  DOCUMENTATION CODES:   Not applicable  INTERVENTION:    Continue TF via OGT, change formula to Vital AF 1.2 at goal rate of 65 ml/h (1560 ml per day) to provide 1872 kcals, 117 gm protein, 1265 ml free water daily.  NUTRITION DIAGNOSIS:   Inadequate oral intake related to inability to eat as evidenced by NPO status.  GOAL:   Patient will meet greater than or equal to 90% of their needs  MONITOR:   Vent status, Labs, TF tolerance, I & O's  REASON FOR ASSESSMENT:   Consult Enteral/tube feeding initiation and management  ASSESSMENT:   67M with PMH of dementia, PVD, prior stroke, prior blood clots, HTN, HLD, GERD, who presents with acute hypoxemic respiratory failure thought secondary to massive aspiration of unknown etiology. Required intubation on admission.  Discussed patient in ICU rounds and with RN today. Received MD Consult for TF initiation and management. Currently receiving Vital High Protein at 40 ml/h with Prostat 30 ml BID to provide 1160 kcal, 114 gm protein, 803 ml free water daily. Tolerating TF well. Nutrition focused physical exam completed.  No muscle or subcutaneous fat depletion noticed. Patient is currently intubated on ventilator support MV: 17 L/min Temp (24hrs), Avg:99 F (37.2 C), Min:97.8 F (36.6 C), Max:99.8 F (37.7 C)  Labs reviewed: potassium elevated at 5.5 Medications reviewed. Propofol is off. CBG's: 125-112  Diet Order:  Diet NPO time specified  Skin:  Reviewed, no issues  Last BM:  PTA  Height:   Ht Readings from Last 1 Encounters:  05/14/16 5\' 5"  (1.651 m)    Weight:   Wt Readings from Last 1 Encounters:  05/15/16 166 lb 3.6 oz (75.4 kg)    Ideal Body Weight:  61.8 kg  BMI:  Body mass index is 27.66 kg/m.  Estimated Nutritional Needs:   Kcal:  1956  Protein:  105-120 gm  Fluid:  2 L  EDUCATION NEEDS:   No education needs identified at this time  Molli Barrows, [place], [place],  [place] Pager [phone] After Hours Pager [phone]

## 2016-05-16 ENCOUNTER — Inpatient Hospital Stay (HOSPITAL_COMMUNITY): Payer: Medicare HMO

## 2016-05-16 DIAGNOSIS — I639 Cerebral infarction, unspecified: Secondary | ICD-10-CM

## 2016-05-16 LAB — ECHOCARDIOGRAM COMPLETE
EERAT: 10.49
EWDT: 183 ms
FS: 28 % (ref 28–44)
Height: 65 in
IVS/LV PW RATIO, ED: 0.85
LA ID, A-P, ES: 28 mm
LA diam index: 1.54 cm/m2
LA vol index: 17 mL/m2
LA vol: 31 mL
LAVOLA4C: 36.6 mL
LDCA: 3.8 cm2
LEFT ATRIUM END SYS DIAM: 28 mm
LV E/e' medial: 10.49
LV E/e'average: 10.49
LV PW d: 12.2 mm — AB (ref 0.6–1.1)
LV TDI E'MEDIAL: 4.11
LV e' LATERAL: 6.47 cm/s
LVOTD: 22 mm
MV Dec: 183
MV pk A vel: 84.4 m/s
MV pk E vel: 67.9 m/s
RV LATERAL S' VELOCITY: 13.5 cm/s
Reg peak vel: 245 cm/s
TDI e' lateral: 6.47
TR max vel: 245 cm/s
Weight: 2631.41 oz

## 2016-05-16 LAB — COMPREHENSIVE METABOLIC PANEL
ALT: 64 U/L — ABNORMAL HIGH (ref 17–63)
ANION GAP: 11 (ref 5–15)
AST: 72 U/L — AB (ref 15–41)
Albumin: 2.2 g/dL — ABNORMAL LOW (ref 3.5–5.0)
Alkaline Phosphatase: 76 U/L (ref 38–126)
BILIRUBIN TOTAL: 0.6 mg/dL (ref 0.3–1.2)
BUN: 59 mg/dL — AB (ref 6–20)
CHLORIDE: 114 mmol/L — AB (ref 101–111)
CO2: 17 mmol/L — ABNORMAL LOW (ref 22–32)
Calcium: 8.1 mg/dL — ABNORMAL LOW (ref 8.9–10.3)
Creatinine, Ser: 3.34 mg/dL — ABNORMAL HIGH (ref 0.61–1.24)
GFR calc Af Amer: 19 mL/min — ABNORMAL LOW (ref >60)
GFR, EST NON AFRICAN AMERICAN: 16 mL/min — AB (ref >60)
Glucose, Bld: 144 mg/dL — ABNORMAL HIGH (ref 65–99)
POTASSIUM: 3.7 mmol/L (ref 3.5–5.1)
Sodium: 142 mmol/L (ref 135–145)
TOTAL PROTEIN: 5.9 g/dL — AB (ref 6.5–8.1)

## 2016-05-16 LAB — CBC
HEMATOCRIT: 28.8 % — AB (ref 39.0–52.0)
HEMOGLOBIN: 9.8 g/dL — AB (ref 13.0–17.0)
MCH: 31.2 pg (ref 26.0–34.0)
MCHC: 34 g/dL (ref 30.0–36.0)
MCV: 91.7 fL (ref 78.0–100.0)
Platelets: 134 10*3/uL — ABNORMAL LOW (ref 150–400)
RBC: 3.14 MIL/uL — AB (ref 4.22–5.81)
RDW: 15 % (ref 11.5–15.5)
WBC: 16.4 10*3/uL — AB (ref 4.0–10.5)

## 2016-05-16 LAB — BLOOD GAS, ARTERIAL
ACID-BASE DEFICIT: 5.8 mmol/L — AB (ref 0.0–2.0)
Bicarbonate: 17.7 mmol/L — ABNORMAL LOW (ref 20.0–28.0)
DRAWN BY: 227661
FIO2: 40
MECHVT: 580 mL
O2 Saturation: 97.3 %
PEEP/CPAP: 5 cmH2O
PO2 ART: 100 mmHg (ref 83.0–108.0)
Patient temperature: 98.1
RATE: 30 resp/min
pCO2 arterial: 26.8 mmHg — ABNORMAL LOW (ref 32.0–48.0)
pH, Arterial: 7.434 (ref 7.350–7.450)

## 2016-05-16 LAB — GLUCOSE, CAPILLARY
GLUCOSE-CAPILLARY: 126 mg/dL — AB (ref 65–99)
GLUCOSE-CAPILLARY: 137 mg/dL — AB (ref 65–99)
Glucose-Capillary: 132 mg/dL — ABNORMAL HIGH (ref 65–99)
Glucose-Capillary: 125 mg/dL — ABNORMAL HIGH (ref 65–99)
Glucose-Capillary: 103 mg/dL — ABNORMAL HIGH (ref 65–99)

## 2016-05-16 LAB — LACTIC ACID, PLASMA
LACTIC ACID, VENOUS: 2.3 mmol/L — AB (ref 0.5–1.9)
LACTIC ACID, VENOUS: 2.1 mmol/L — AB (ref 0.5–1.9)

## 2016-05-16 LAB — TRIGLYCERIDES: TRIGLYCERIDES: 190 mg/dL — AB (ref <150)

## 2016-05-16 LAB — PHOSPHORUS: PHOSPHORUS: 2.7 mg/dL (ref 2.5–4.6)

## 2016-05-16 LAB — PROCALCITONIN: Procalcitonin: >175 ng/mL

## 2016-05-16 LAB — MAGNESIUM: MAGNESIUM: 2.4 mg/dL (ref 1.7–2.4)

## 2016-05-16 MED ORDER — PIPERACILLIN-TAZOBACTAM 3.375 G IVPB
3.3750 g | Freq: Three times a day (TID) | INTRAVENOUS | Status: DC
  Filled 2016-05-16 (×2): qty 50

## 2016-05-16 MED ORDER — PIPERACILLIN-TAZOBACTAM IN DEX 2-0.25 GM/50ML IV SOLN
2.2500 g | Freq: Three times a day (TID) | INTRAVENOUS | Status: DC
  Administered 2016-05-16 – 2016-05-17 (×2): 2.25 g via INTRAVENOUS
  Filled 2016-05-16 (×4): qty 50

## 2016-05-16 MED ORDER — SODIUM CHLORIDE 0.9 % IV SOLN: INTRAVENOUS | Status: DC

## 2016-05-16 NOTE — Progress Notes (Signed)
PULMONARY / CRITICAL CARE MEDICINE   Name: Bobby Vega MRN: [id number] DOB: 1934-09-25    ADMISSION DATE:  05/13/2016 CONSULTATION DATE:    REFERRING MD:  EDP  CHIEF COMPLAINT:  Respiratory distress  Brief:   Bobby Vega is a 23M with history of dementia, GERD, HLD, HTN, PVD, prior stroke, prior blood clots, and BPH with urgency, who presents via EMS following syncopal episode. En route patient was confused and had increasing shortness of breath. On arrival to ED patient placed on NRB with sats in the 80s; decision made to intubate. Family (daughter) provides additional history that he had some friends over and was vomiting a lot. He got very pale and they called EMS. His oxygen levels were reportedly a little low, but the patient refused to go with them to the hospital. After EMS left, he had another episode of vomiting and EMS was again called and this time was successful in bringing him in for evaluation. No clear history of syncope. No sick contacts. No known exposures or other complaints related to vomiting. No report of abdominal pain or chest pain. No cough / sputum / fever / chills leading up to this. Head CT is pending. CXR showed bilateral opacities concerning for aspiration. Antibiotics were initiated in the ED with vancomycin and zosyn.   Imaging 11/18 CXR bilateral opacities concerning for asp pneumonia   11/18  head CT without acute intracranial finding   11/20 CXR with persistent bilateral opaciities  11/21 CXR with persistent bilateral opacities, left >right  CULTURES: 11/20 Blood  11/20 Sputum: rare GPC in pairs and yeast   ANTIBIOTICS: Vancomycin 11/18 >> Zosyn 11/18 >> Zithromax 11/20>>  SIGNIFICANT EVENTS: 11/18 admitted with acute hypoxic resp failure due aspiration pneumonia and intubated  LINES/TUBES: ETT 11/18 >> 2PIV 11/18>> Foley 11/18>>  OGT 11/18>>  SUBJECTIVE: No acute event overnight. He is awake but appears confused. Intermittently follows  command.  Failed PST this am.  VITAL SIGNS: BP 121/67   Pulse 94   Temp 98.9 F (37.2 C) (Oral)   Resp (!) 30   Ht 5\' 5"  (1.651 m)   Wt 164 lb 7.4 oz (74.6 kg)   SpO2 100%   BMI 27.37 kg/m   HEMODYNAMICS: Not on pressors  VENTILATOR SETTINGS: Vent Mode: PRVC FiO2 (%):  [40 %] 40 % Set Rate:  [30 bmp] 30 bmp Vt Set:  [580 mL] 580 mL PEEP:  [5 cmH20] 5 cmH20 Plateau Pressure:  [24 HWE99-37 cmH20] 28 cmH20  INTAKE / OUTPUT: I/O last 3 completed shifts: In: 4569.8 [I.V.:2520; NG/GT:1499.8; IV Piggyback:550] Out: 1800 [Urine:1800]  PHYSICAL EXAMINATION:  GEN: awake, intermittently follows command HEENT: arcus seniles CVS: RRR, normal s1 and s2, no murmurs, no edema RESP: on ETT, synchronous with vent, no wheeze or crackles GI: Bowel sounds present, soft MSK:no focal tenderness or swelling,. Gr 1 edema SKIN: no apparent skin lesion NEURO: awake, intermittently follows command, PERRL, not able to illicit further neuro due to poor cooperation.  PSYCH: appropriate mood and affect   LABS:  BMET  Recent Labs Lab 05/15/16 0431 05/15/16 1650 05/16/16 0204  NA 135 144 142  K 5.5* 4.2 3.7  CL 110 118* 114*  CO2 16* 15* 17*  BUN 53* 59* 59*  CREATININE 3.59* 3.68* 3.34*  GLUCOSE 121* 137* 144*    Electrolytes  Recent Labs Lab 05/15/16 0431 05/15/16 1650 05/16/16 0204  CALCIUM 7.7* 8.3* 8.1*  MG 1.7 2.5* 2.4  PHOS 4.6 3.1 2.7    CBC  Recent Labs Lab 05/14/16 0649 05/15/16 0431 05/16/16 0204  WBC 7.0 11.7* 16.4*  HGB 11.9* 11.1* 9.8*  HCT 38.3* 33.8* 28.8*  PLT 159 130* 134*    Coag's  Recent Labs Lab 05/13/16 1736  INR 1.10    Sepsis Markers  Recent Labs Lab 05/13/16 2359 05/15/16 0722 05/15/16 0934 05/15/16 1104 05/16/16 0204  LATICACIDVEN 3.8* 2.8* 2.3*  --   --   PROCALCITON  --   --   --  >175.00 >175.00    ABG  Recent Labs Lab 05/13/16 1735 05/14/16 1628 05/15/16 0346  PHART 7.398 7.213* 7.287*  PCO2ART 28.7* 38.6  31.2*  PO2ART 94.0 80.0* 79.0*    Liver Enzymes  Recent Labs Lab 05/13/16 1736 05/15/16 0431 05/16/16 0204  AST 21 139* 72*  ALT 13* 89* 64*  ALKPHOS 97 53 76  BILITOT 0.5 0.6 0.6  ALBUMIN 3.8 2.4* 2.2*    Cardiac Enzymes No results for input(s): TROPONINI, PROBNP in the last 168 hours.  Glucose  Recent Labs Lab 05/15/16 0736 05/15/16 1143 05/15/16 1535 05/15/16 1955 05/15/16 2350 05/16/16 0334  GLUCAP 125* 112* 119* 128* 119* 126*    DISCUSSION: Bobby Vega is a 33M with PMH of dementia, PVD, prior stroke, prior blood clots, HTN, HLD, GERD, who presents with acute hypoxemic respiratory failure thought secondary to massive aspiration of unknown etiology being covered with vanc and zosyn.   ASSESSMENT / PLAN:  PULMONARY A: Acute hypoxemic respiratory failure, thought 2/2 aspiration pneumonia. Possible pulm edema.  CXR with diffuse infiltrate left > rt P:   ABG this morning SAT/SBT when able AM CXR Continue Vanc, Zosyn and Azithro.  CARDIOVASCULAR A:  Sinus tachycardia: improved Borderline BP: improved P:  Monitor May need diuresis  RENAL A:   AKI w/ Cr. 3.13>>3.59>>3.34. (unclear b/l likely <2). Foley placed by urology. Renal US negative I&O 1.4/+1.6/+5.2 HyperK/HypoMg: resolved HyperCl metabolic acidosis: improving  Lactic acidosis: improved P:   KVO Check lactate down Avoid nephrotoxins  GASTROINTESTINAL A:   New transaminitis: improved. CK mildly elevted Nausea and vomiting - unclear etiology - resolved Last BM 11/19 P:   Continue TF Famotidine iv for gi ppx Prn antiemetics  HEMATOLOGIC A:   Leukocytosis Anemia of Chronic disease Mild thrombocytopenia P:  Trend CBC Sub q hep  INFECTIOUS A:   Aspiration PNA. Possibel; atypical PNA as well.  Leukocytosis Afebrile P:   Continue antibiotics (vanc/zosyn/Azithro) > de escalate in 1-2 days with cultures.  Trend lactate  ENDOCRINE A:   No issues  P:  D/c CBG and SSI Follow  sugar with BMP glucose  NEUROLOGIC A:   Altered mental status - likely toxic metabolic encephalopathy- resolved CT head negative P:   RASS goal: 0 to -1 Wua, fent  FAMILY  - Updates: see below  - Inter-disciplinary family meet or Palliative Care meeting due by:  day 7  Wendee Beavers, MD.  05/16/16 7:06 AM PGY-2    ATTENDING NOTE / ATTESTATION NOTE :   I have discussed the case with the resident/APP  Wendee Beavers MD  I agree with the resident/APP's  history, physical examination, assessment, and plans.    I have edited the above note and modified it according to our agreed history, physical examination, assessment and plan.   I have spent 30 minutes of critical care time with this patient today.  Family :Caregiver updated at bedside today.    Monica Becton, MD 05/16/2016, 11:56 AM [facility] Pager [phone] After 3 pm or if no answer, call [phone]

## 2016-05-16 NOTE — Care Management Note (Signed)
Case Management Note  Patient Details  Name: Bobby Vega MRN: [id number] Date of Birth: [birth year]  Subjective/Objective:   Admitted with resp distress                  Action/Plan:  Pt is from home.  Pt remains on ventilator - no family at bedside and family not available via phone.  CM will continue to follow for discharge needs   Expected Discharge Date:                  Expected Discharge Plan:     In-House Referral:     Discharge planning Services  CM Consult  Post Acute Care Choice:    Choice offered to:     DME Arranged:    DME Agency:     HH Arranged:    HH Agency:     Status of Service:  In process, will continue to follow  If discussed at Long Length of Stay Meetings, dates discussed:    Additional Comments:  Maryclare Labrador, RN 05/16/2016, 3:39 PM

## 2016-05-16 NOTE — Progress Notes (Addendum)
Pharmacy Antibiotic Note  Bobby Vega is a 81 y.o. male who continues on antibiotics for pneumonia. Renal function slightly improved, however, will continue Vancomycin and Zosyn at current dose. WBC elevated at 16.4, LA 2.1, and patient with low grade temperature 99.2.   Plan: Continue Zosyn at 2.25g q8hr Continue Vancomycin 750mg  q48hr Monitor renal function, culture results, clinical picture, and vancomycin trough as needed    Height: 5\' 5"  (165.1 cm) Weight: 164 lb 7.4 oz (74.6 kg) IBW/kg (Calculated) : 61.5  Temp (24hrs), Avg:99.1 F (37.3 C), Min:98.1 F (36.7 C), Max:99.8 F (37.7 C)   Recent Labs Lab 05/13/16 1736  05/13/16 2359 05/14/16 0125 05/14/16 0649 05/15/16 0431 05/15/16 0722 05/15/16 0934 05/15/16 1650 05/16/16 0204 05/16/16 0850 05/16/16 1120  WBC 11.1*  --   --  3.1* 7.0 11.7*  --   --   --  16.4*  --   --   CREATININE 3.13*  < >  --  2.83* 3.35* 3.59*  --   --  3.68* 3.34*  --   --   LATICACIDVEN  --   < > 3.8*  --   --   --  2.8* 2.3*  --   --  2.3* 2.1*  VANCOTROUGH  --   --   --   --   --   --   --   --  20  --   --   --   < > = values in this interval not displayed.  Estimated Creatinine Clearance: 16.9 mL/min (by C-G formula based on SCr of 3.34 mg/dL (H)).    No Known Allergies  Antimicrobials this admission: Vancomycin 11/18 >>  Zosyn 11/18 >>  Azith 11/20>> 3 days  Dose adjustments this admission: 11/20: VT = 20 (early and after only 2 doses) on 750mg  q24 >> change to 750mg  q48  Microbiology results: 11/18 mrsa pcr: neg 11/20 sputum: rare GPCs and yeast  Argie Ramming, PharmD Pharmacy Resident  Pager [phone] 05/16/16 3:27 PM

## 2016-05-16 NOTE — Progress Notes (Signed)
CRITICAL VALUE ALERT  Critical value received:  Lactic acid 2.1  Date of notification:  05/16/16  Time of notification:  1222  Critical value read back:Yes.    Nurse who received alert:  Dorena Cookey RN  MD notified (1st page):  Tennis Must dios  Time of first page:  1224  MD notified (2nd page):  Time of second page:  Responding MD:  Tennis Must dios  Time MD responded:  1224

## 2016-05-17 ENCOUNTER — Inpatient Hospital Stay (HOSPITAL_COMMUNITY): Payer: Medicare HMO

## 2016-05-17 LAB — CBC
HEMATOCRIT: 26.7 % — AB (ref 39.0–52.0)
Hemoglobin: 9 g/dL — ABNORMAL LOW (ref 13.0–17.0)
MCH: 30.5 pg (ref 26.0–34.0)
MCHC: 33.7 g/dL (ref 30.0–36.0)
MCV: 90.5 fL (ref 78.0–100.0)
PLATELETS: 131 10*3/uL — AB (ref 150–400)
RBC: 2.95 MIL/uL — AB (ref 4.22–5.81)
RDW: 14.9 % (ref 11.5–15.5)
WBC: 17.1 10*3/uL — AB (ref 4.0–10.5)

## 2016-05-17 LAB — GLUCOSE, CAPILLARY
GLUCOSE-CAPILLARY: 129 mg/dL — AB (ref 65–99)
Glucose-Capillary: 119 mg/dL — ABNORMAL HIGH (ref 65–99)
Glucose-Capillary: 131 mg/dL — ABNORMAL HIGH (ref 65–99)
Glucose-Capillary: 143 mg/dL — ABNORMAL HIGH (ref 65–99)
Glucose-Capillary: 151 mg/dL — ABNORMAL HIGH (ref 65–99)

## 2016-05-17 LAB — BASIC METABOLIC PANEL
ANION GAP: 11 (ref 5–15)
BUN: 61 mg/dL — AB (ref 6–20)
CALCIUM: 8.1 mg/dL — AB (ref 8.9–10.3)
CO2: 18 mmol/L — ABNORMAL LOW (ref 22–32)
Chloride: 115 mmol/L — ABNORMAL HIGH (ref 101–111)
Creatinine, Ser: 3.33 mg/dL — ABNORMAL HIGH (ref 0.61–1.24)
GFR calc Af Amer: 19 mL/min — ABNORMAL LOW (ref >60)
GFR, EST NON AFRICAN AMERICAN: 16 mL/min — AB (ref >60)
GLUCOSE: 144 mg/dL — AB (ref 65–99)
POTASSIUM: 3.3 mmol/L — AB (ref 3.5–5.1)
SODIUM: 144 mmol/L (ref 135–145)

## 2016-05-17 LAB — PHOSPHORUS: PHOSPHORUS: 2.1 mg/dL — AB (ref 2.5–4.6)

## 2016-05-17 LAB — CULTURE, RESPIRATORY
CULTURE: NORMAL
GRAM STAIN: NONE SEEN

## 2016-05-17 LAB — BLOOD GAS, ARTERIAL
Acid-base deficit: 5.7 mmol/L — ABNORMAL HIGH (ref 0.0–2.0)
BICARBONATE: 17.7 mmol/L — AB (ref 20.0–28.0)
Drawn by: 437071
FIO2: 30
LHR: 30 {breaths}/min
O2 SAT: 94.3 %
PATIENT TEMPERATURE: 99.2
PCO2 ART: 26.8 mmHg — AB (ref 32.0–48.0)
PEEP/CPAP: 5 cmH2O
PH ART: 7.437 (ref 7.350–7.450)
PO2 ART: 75.7 mmHg — AB (ref 83.0–108.0)
VT: 580 mL

## 2016-05-17 LAB — PROCALCITONIN: Procalcitonin: >175 ng/mL

## 2016-05-17 LAB — MAGNESIUM: MAGNESIUM: 2.3 mg/dL (ref 1.7–2.4)

## 2016-05-17 MED ORDER — DEXMEDETOMIDINE HCL IN NACL 400 MCG/100ML IV SOLN
0.4000 ug/kg/h | INTRAVENOUS | Status: DC
  Administered 2016-05-17 – 2016-05-18 (×2): 1.2 ug/kg/h via INTRAVENOUS
  Administered 2016-05-18: 0.8 ug/kg/h via INTRAVENOUS
  Administered 2016-05-18: 1.2 ug/kg/h via INTRAVENOUS
  Administered 2016-05-18: 1 ug/kg/h via INTRAVENOUS
  Administered 2016-05-19: 0.8 ug/kg/h via INTRAVENOUS
  Filled 2016-05-17 (×6): qty 100

## 2016-05-17 MED ORDER — SODIUM CHLORIDE 0.9 % IV SOLN
3.0000 g | Freq: Two times a day (BID) | INTRAVENOUS | Status: AC
  Administered 2016-05-17 – 2016-05-20 (×7): 3 g via INTRAVENOUS
  Filled 2016-05-17 (×7): qty 3

## 2016-05-17 MED ORDER — DEXMEDETOMIDINE HCL IN NACL 200 MCG/50ML IV SOLN
0.4000 ug/kg/h | INTRAVENOUS | Status: DC
  Administered 2016-05-17 (×2): 0.8 ug/kg/h via INTRAVENOUS
  Administered 2016-05-17: 1 ug/kg/h via INTRAVENOUS
  Administered 2016-05-17: 0.4 ug/kg/h via INTRAVENOUS
  Administered 2016-05-17: 0.8 ug/kg/h via INTRAVENOUS
  Filled 2016-05-17: qty 50
  Filled 2016-05-17 (×2): qty 100

## 2016-05-17 MED ORDER — POTASSIUM PHOSPHATES 15 MMOLE/5ML IV SOLN
30.0000 mmol | Freq: Once | INTRAVENOUS | Status: AC
  Administered 2016-05-17: 30 mmol via INTRAVENOUS
  Filled 2016-05-17: qty 10

## 2016-05-17 MED ORDER — FUROSEMIDE 10 MG/ML IJ SOLN
20.0000 mg | Freq: Once | INTRAMUSCULAR | Status: AC
  Administered 2016-05-17: 20 mg via INTRAVENOUS
  Filled 2016-05-17: qty 2

## 2016-05-17 NOTE — Progress Notes (Signed)
PULMONARY / CRITICAL CARE MEDICINE   Name: Bobby Vega MRN: [id number] DOB: 01/19/1935    ADMISSION DATE:  05/13/2016 CONSULTATION DATE:    REFERRING MD:  EDP  CHIEF COMPLAINT:  Respiratory distress  Brief:   Bobby Vega is a 48M with history of dementia, GERD, HLD, HTN, PVD, prior stroke, prior blood clots, and BPH with urgency, who presents via EMS following syncopal episode. En route patient was confused and had increasing shortness of breath. On arrival to ED patient placed on NRB with sats in the 80s; decision made to intubate. Family (daughter) provides additional history that he had some friends over and was vomiting a lot. He got very pale and they called EMS. His oxygen levels were reportedly a little low, but the patient refused to go with them to the hospital. After EMS left, he had another episode of vomiting and EMS was again called and this time was successful in bringing him in for evaluation. No clear history of syncope. No sick contacts. No known exposures or other complaints related to vomiting. No report of abdominal pain or chest pain. No cough / sputum / fever / chills leading up to this. Head CT is pending. CXR showed bilateral opacities concerning for aspiration. Antibiotics were initiated in the ED with vancomycin and zosyn.   Imaging 11/18 CXR bilateral opacities concerning for asp pneumonia   11/18  head CT without acute intracranial finding   11/20 CXR with persistent bilateral opaciities  11/21 CXR with persistent bilateral opacities, left >right  11/22 CXR with persistent bilateral opacities, left >right. May be improved. ETT 5 cm above carina  CULTURES: 11/20 Blood : NGTD 11/20 Sputum: rare GPC in pairs and yeast   ANTIBIOTICS: Vancomycin 11/18 >> 11/22 Zosyn 11/18 >> 11/22 Zithromax 11/20>> 11.22 Unasyn 11/22 > stop date 11/ 25  SIGNIFICANT EVENTS: 11/18 admitted with acute hypoxic resp failure due aspiration pneumonia and  intubated  LINES/TUBES: ETT 11/18 >> 2PIV 11/18>> Foley 11/18>>  OGT 11/18>>  SUBJECTIVE: Awake. Moves arms. Intermittenly follows command.  On PS 10/5 this morning > tachypneic. Fair volumes.   VITAL SIGNS: BP 140/70   Pulse 85   Temp 99.2 F (37.3 C) (Oral)   Resp (!) 30   Ht 5\' 5"  (1.651 m)   Wt 77.1 kg (169 lb 15.6 oz)   SpO2 99%   BMI 28.29 kg/m   HEMODYNAMICS: Not on pressors  VENTILATOR SETTINGS: Vent Mode: PRVC FiO2 (%):  [30 %-40 %] 30 % Set Rate:  [30 bmp] 30 bmp Vt Set:  [560 mL-580 mL] 580 mL PEEP:  [5 cmH20] 5 cmH20 Pressure Support:  [12 cmH20] 12 cmH20 Plateau Pressure:  [23 cmH20-28 cmH20] 26 cmH20  INTAKE / OUTPUT: I/O last 3 completed shifts: In: 3462.9 [I.V.:1297.9; NG/GT:1815; IV [id number]:725] Out: 2095 [Urine:1920; Stool:175]  PHYSICAL EXAMINATION:  GEN: awake, intermittently follows command HEENT: arcus seniles CVS: RRR, normal s1 and s2, no murmurs, no edema RESP: on ETT, on PS, mild ronchi, no wheeze or crackles GI: Bowel sounds present, soft MSK:no focal tenderness or swelling,. Gr 1 edema SKIN: no apparent skin lesion NEURO: awake, intermittently follows command, PERRL, not able to illicit further neuro due to poor cooperation.  PSYCH: appropriate mood and affect   LABS:  BMET  Recent Labs Lab 05/15/16 1650 05/16/16 0204 05/17/16 0304  NA 144 142 144  K 4.2 3.7 3.3*  CL 118* 114* 115*  CO2 15* 17* 18*  BUN 59* 59* 61*  CREATININE 3.68* 3.34* 3.33*  GLUCOSE 137* 144* 144*    Electrolytes  Recent Labs Lab 05/15/16 1650 05/16/16 0204 05/17/16 0304  CALCIUM 8.3* 8.1* 8.1*  MG 2.5* 2.4 2.3  PHOS 3.1 2.7 2.1*    CBC  Recent Labs Lab 05/15/16 0431 05/16/16 0204 05/17/16 0304  WBC 11.7* 16.4* 17.1*  HGB 11.1* 9.8* 9.0*  HCT 33.8* 28.8* 26.7*  PLT 130* 134* 131*    Coag's  Recent Labs Lab 05/13/16 1736  INR 1.10    Sepsis Markers  Recent Labs Lab 05/15/16 0934 05/15/16 1104 05/16/16 0204  05/16/16 0850 05/16/16 1120 05/17/16 0304  LATICACIDVEN 2.3*  --   --  2.3* 2.1*  --   PROCALCITON  --  >175.00 >175.00  --   --  >175.00    ABG  Recent Labs Lab 05/15/16 0346 05/16/16 0730 05/17/16 0700  PHART 7.287* 7.434 7.437  PCO2ART 31.2* 26.8* 26.8*  PO2ART 79.0* 100 75.7*    Liver Enzymes  Recent Labs Lab 05/13/16 1736 05/15/16 0431 05/16/16 0204  AST 21 139* 72*  ALT 13* 89* 64*  ALKPHOS 97 53 76  BILITOT 0.5 0.6 0.6  ALBUMIN 3.8 2.4* 2.2*    Cardiac Enzymes No results for input(s): TROPONINI, PROBNP in the last 168 hours.  Glucose  Recent Labs Lab 05/16/16 0800 05/16/16 1140 05/16/16 1610 05/16/16 2006 05/17/16 0005 05/17/16 0417  GLUCAP 132* 125* 103* 137* 119* 143*    DISCUSSION: Bobby Vega is a 43M with PMH of dementia, PVD, prior stroke, prior blood clots, HTN, HLD, GERD, who presents with acute hypoxemic respiratory failure thought secondary to massive aspiration of unknown etiology being covered with vanc and zosyn.   ASSESSMENT / PLAN:  PULMONARY A: Acute hypoxemic respiratory failure, thought 2/2 aspiration pneumonia. Possible pulm edema.  CXR with diffuse infiltrate left > rt P:   SAT/SBT. Main barrier to extubation is mental status. Per care giver, pt was conversant with mild dementia, walks around. Per family, this is his baseline. I think he is worse than his baseline. Will try to wean off fentanyl and try precedex so he will be more awake.  AM CXR On  Vanc, Zosyn and Azithro > switch to unasyn.   CARDIOVASCULAR A:  Sinus tachycardia: resolved Pulm edema P:  Monitor Mild diuresce.   RENAL A:   AKI w/ Cr. 3.13>>3.59>>3.33. (unclear b/l likely <2). Foley placed by urology. Renal US negative I&O 1.2/+0.5/+5.8L HypoK and P HyperCl metabolic acidosis: improving  Lactic acidosis: improved P:   KVO KPhos Check lactate down Avoid nephrotoxins  GASTROINTESTINAL A:   New transaminitis: improved. CK mildly  elevted Nausea and vomiting - unclear etiology - resolved Last BM 11/21 P:   Continue TF Famotidine iv for gi ppx Prn antiemetics  HEMATOLOGIC A:   Leukocytosis Anemia of Chronic disease Mild thrombocytopenia P:  Trend CBC Sub q hep  INFECTIOUS A:   Aspiration PNA. Possible; atypical PNA as well.  Leukocytosis Afebrile P:   Switch to unasyn > plan 7 d total abx Trend lactate  ENDOCRINE A:   No issues  P:  D/c CBG and SSI Follow sugar with BMP glucose  NEUROLOGIC A:   Altered mental status - likely toxic metabolic encephalopathy- resolved CT head negative P:   RASS goal: 0 to -1 Wua Plan to taper off fentanyl and try precedex.   FAMILY  - Updates: no family at bedside.   - Inter-disciplinary family meet or Palliative Care meeting due by:  day 7  Wendee Beavers, MD.  05/17/16 7:40 AM PGY-2    ATTENDING NOTE / ATTESTATION NOTE :   I have discussed the case with the resident/APP  Wendee Beavers MD  I agree with the resident/APP's  history, physical examination, assessment, and plans.    I have edited the above note and modified it according to our agreed history, physical examination, assessment and plan.   I have spent 30  minutes of critical care time with this patient today.  Family : No family at bedside.    Monica Becton, MD 05/17/2016, 8:59 AM [facility] Pager [phone] After 3 pm or if no answer, call [phone]

## 2016-05-17 NOTE — Care Management Important Message (Signed)
Important Message  Patient Details  Name: Bobby Vega MRN: [id number] Date of Birth: 09-04-1934   Medicare Important Message Given:  Yes    Nathen May 05/17/2016, 12:10 PM

## 2016-05-18 ENCOUNTER — Inpatient Hospital Stay (HOSPITAL_COMMUNITY): Payer: Medicare HMO

## 2016-05-18 LAB — BASIC METABOLIC PANEL
ANION GAP: 12 (ref 5–15)
BUN: 67 mg/dL — AB (ref 6–20)
CHLORIDE: 116 mmol/L — AB (ref 101–111)
CO2: 15 mmol/L — AB (ref 22–32)
Calcium: 8.4 mg/dL — ABNORMAL LOW (ref 8.9–10.3)
Creatinine, Ser: 3.42 mg/dL — ABNORMAL HIGH (ref 0.61–1.24)
GFR calc Af Amer: 18 mL/min — ABNORMAL LOW (ref >60)
GFR, EST NON AFRICAN AMERICAN: 16 mL/min — AB (ref >60)
GLUCOSE: 207 mg/dL — AB (ref 65–99)
POTASSIUM: 3.7 mmol/L (ref 3.5–5.1)
Sodium: 143 mmol/L (ref 135–145)

## 2016-05-18 LAB — CBC
HEMATOCRIT: 30.5 % — AB (ref 39.0–52.0)
HEMOGLOBIN: 10.4 g/dL — AB (ref 13.0–17.0)
MCH: 31 pg (ref 26.0–34.0)
MCHC: 34.1 g/dL (ref 30.0–36.0)
MCV: 90.8 fL (ref 78.0–100.0)
PLATELETS: 149 10*3/uL — AB (ref 150–400)
RBC: 3.36 MIL/uL — AB (ref 4.22–5.81)
RDW: 15.2 % (ref 11.5–15.5)
WBC: 14.6 10*3/uL — AB (ref 4.0–10.5)

## 2016-05-18 LAB — MAGNESIUM: Magnesium: 2.3 mg/dL (ref 1.7–2.4)

## 2016-05-18 LAB — PHOSPHORUS: Phosphorus: 3.9 mg/dL (ref 2.5–4.6)

## 2016-05-18 MED ORDER — FUROSEMIDE 10 MG/ML IJ SOLN
20.0000 mg | Freq: Two times a day (BID) | INTRAMUSCULAR | Status: DC
  Administered 2016-05-18 (×2): 20 mg via INTRAVENOUS
  Filled 2016-05-18 (×3): qty 2

## 2016-05-18 MED ORDER — HYDRALAZINE HCL 20 MG/ML IJ SOLN
10.0000 mg | INTRAMUSCULAR | Status: DC | PRN
  Administered 2016-05-18 – 2016-05-19 (×3): 10 mg via INTRAVENOUS
  Filled 2016-05-18 (×3): qty 1

## 2016-05-18 NOTE — Progress Notes (Signed)
eLink Physician-Brief Progress Note Patient Name: Julia Kulzer DOB: 07/16/1934 MRN: [id number]   Date of Service  05/18/2016  HPI/Events of Note  sbp >180  eICU Interventions  Hydralazine prn     Intervention Category Evaluation Type: Other  Flora Lipps 05/18/2016, 5:23 AM

## 2016-05-18 NOTE — Progress Notes (Signed)
PULMONARY / CRITICAL CARE MEDICINE   Name: Bobby Vega MRN: [id number] DOB: [birth year]    ADMISSION DATE:  05/13/2016 CONSULTATION DATE:    REFERRING MD:  EDP  CHIEF COMPLAINT:  Respiratory distress  Brief:   Bobby Vega is a 12M with history of dementia, GERD, HLD, HTN, PVD, prior stroke, prior blood clots, and BPH with urgency, who presents via EMS following syncopal episode. En route patient was confused and had increasing shortness of breath. On arrival to ED patient placed on NRB with sats in the 80s; decision made to intubate. Family (daughter) provides additional history that he had some friends over and was vomiting a lot. He got very pale and they called EMS. His oxygen levels were reportedly a little low, but the patient refused to go with them to the hospital. After EMS left, he had another episode of vomiting and EMS was again called and this time was successful in bringing him in for evaluation. No clear history of syncope. No sick contacts. No known exposures or other complaints related to vomiting. No report of abdominal pain or chest pain. No cough / sputum / fever / chills leading up to this. Head CT is pending. CXR showed bilateral opacities concerning for aspiration. Antibiotics were initiated in the ED with vancomycin and zosyn.   Imaging 11/18 CXR bilateral opacities concerning for asp pneumonia   11/18  head CT without acute intracranial finding   11/20 CXR with persistent bilateral opaciities  11/21 CXR with persistent bilateral opacities, left >right  11/22 CXR with persistent bilateral opacities, left >right. May be improved. ETT 5 cm above carina  CULTURES: 11/20 Blood : NGTD 11/20 Sputum: rare GPC in pairs and yeast   ANTIBIOTICS: Vancomycin 11/18 >> 11/22 Zosyn 11/18 >> 11/22 Zithromax 11/20>> 11.22 Unasyn 11/22 > stop date 11/ 25  SIGNIFICANT EVENTS: 11/18 admitted with acute hypoxic resp failure due aspiration pneumonia and  intubated  LINES/TUBES: ETT 11/18 >> 2PIV 11/18>> Foley 11/18>>  OGT 11/18>>  SUBJECTIVE: Awake. Moves arms. Intermittenly follows command.  On PS 10/5 this morning > tachypneic.  More treache secretions last 24 hrs.   VITAL SIGNS: BP 135/68   Pulse (!) 52   Temp 98.7 F (37.1 C) (Oral)   Resp (!) 30   Ht 5\' 5"  (1.651 m)   Wt 75.8 kg (167 lb 1.7 oz)   SpO2 99%   BMI 27.81 kg/m   HEMODYNAMICS: Not on pressors  VENTILATOR SETTINGS: Vent Mode: PRVC FiO2 (%):  [30 %] 30 % Set Rate:  [30 bmp] 30 bmp Vt Set:  [580 mL] 580 mL PEEP:  [5 cmH20] 5 cmH20 Plateau Pressure:  [24 MAU63-33 cmH20] 28 cmH20  INTAKE / OUTPUT: I/O last 3 completed shifts: In: 3686.1 [I.V.:1021.1; NG/GT:1905; IV Piggyback:760] Out: 2700 [Urine:2700]  PHYSICAL EXAMINATION:  GEN: awake, intermittently follows command HEENT: arcus seniles CVS: RRR, normal s1 and s2, no murmurs, no edema RESP: on ETT, rhonchi on BLF. Some crackles.  GI: Bowel sounds present, soft MSK:no focal tenderness or swelling,. Gr 1 edema SKIN: no apparent skin lesion NEURO: awake, intermittently follows command, PERRL, not able to illicit further neuro due to poor cooperation.  PSYCH: appropriate mood and affect   LABS:  BMET  Recent Labs Lab 05/16/16 0204 05/17/16 0304 05/18/16 0258  NA 142 144 143  K 3.7 3.3* 3.7  CL 114* 115* 116*  CO2 17* 18* 15*  BUN 59* 61* 67*  CREATININE 3.34* 3.33* 3.42*  GLUCOSE 144* 144* 207*  Electrolytes  Recent Labs Lab 05/16/16 0204 05/17/16 0304 05/18/16 0258  CALCIUM 8.1* 8.1* 8.4*  MG 2.4 2.3 2.3  PHOS 2.7 2.1* 3.9    CBC  Recent Labs Lab 05/16/16 0204 05/17/16 0304 05/18/16 0258  WBC 16.4* 17.1* 14.6*  HGB 9.8* 9.0* 10.4*  HCT 28.8* 26.7* 30.5*  PLT 134* 131* 149*    Coag's  Recent Labs Lab 05/13/16 1736  INR 1.10    Sepsis Markers  Recent Labs Lab 05/15/16 0934 05/15/16 1104 05/16/16 0204 05/16/16 0850 05/16/16 1120 05/17/16 0304   LATICACIDVEN 2.3*  --   --  2.3* 2.1*  --   PROCALCITON  --  >175.00 >175.00  --   --  >175.00    ABG  Recent Labs Lab 05/15/16 0346 05/16/16 0730 05/17/16 0700  PHART 7.287* 7.434 7.437  PCO2ART 31.2* 26.8* 26.8*  PO2ART 79.0* 100 75.7*    Liver Enzymes  Recent Labs Lab 05/13/16 1736 05/15/16 0431 05/16/16 0204  AST 21 139* 72*  ALT 13* 89* 64*  ALKPHOS 97 53 76  BILITOT 0.5 0.6 0.6  ALBUMIN 3.8 2.4* 2.2*    Cardiac Enzymes No results for input(s): TROPONINI, PROBNP in the last 168 hours.  Glucose  Recent Labs Lab 05/16/16 2006 05/17/16 0005 05/17/16 0417 05/17/16 0819 05/17/16 1131 05/17/16 1619  GLUCAP 137* 119* 143* 131* 151* 129*    DISCUSSION: Bobby Vega is a 68M with PMH of dementia, PVD, prior stroke, prior blood clots, HTN, HLD, GERD, who presents with acute hypoxemic respiratory failure thought secondary to massive aspiration of unknown etiology being covered with vanc and zosyn.   ASSESSMENT / PLAN:  PULMONARY A: Acute hypoxemic respiratory failure, thought 2/2 aspiration pneumonia. Possible pulm edema.  CXR with diffuse infiltrate left > rt. More ET secretions last 24 hrs.  P:   SAT/SBT. Main barrier to extubation is mental status. Per care giver, pt was conversant with mild dementia, walks around. Per family, this is his baseline. I think he is worse than his baseline. Last 24 hrs >  More ET secretions.  AM CXR On  Vanc, Zosyn and Azithro > switched  to unasyn 11/22 Will send another culture today. May need to switch back to vanc and zosyn if he deteriorates next 24 hrs.  Will diuresce also.   CARDIOVASCULAR A:  Sinus tachycardia: resolved Pulm edema P:  Monitor Mild diuresce.   RENAL A:   AKI w/ Cr. 3.13>>3.59>>3.33. (unclear b/l likely <2). Foley placed by urology. Renal US negative I&O 1.2/+0.5/+5.8L HypoK and P HyperCl metabolic acidosis: improving  Lactic acidosis: improved P:   KVO Avoid nephrotoxins diuresce  gently  GASTROINTESTINAL A:   New transaminitis: improved. CK mildly elevted Nausea and vomiting - unclear etiology - resolved Last BM 11/21 P:   Continue TF Famotidine iv for gi ppx Prn antiemetics  HEMATOLOGIC A:   Leukocytosis Anemia of Chronic disease Mild thrombocytopenia P:  Trend CBC Sub q hep  INFECTIOUS A:   Aspiration PNA. Possible; atypical PNA as well.  Leukocytosis Afebrile P:   Switched  to unasyn 11/22 > plan 7 d total abx. Inc ET secretions 11/23. Send for culture. May need to broaden abx.    ENDOCRINE A:   No issues  P:  Follow sugar with BMP glucose  NEUROLOGIC A:   Altered mental status - likely toxic metabolic encephalopathy- resolved CT head negative P:   RASS goal: 0 to -1 Wua Plan to taper off fentanyl and try precedex.   FAMILY  -  Updates: no family at bedside.   - Inter-disciplinary family meet or Palliative Care meeting due by:  day 7  I have spent 30  minutes of critical care time with this patient today.    Monica Becton, MD 05/18/2016, 11:14 AM [facility] Pager [phone] After 3 pm or if no answer, call [phone]

## 2016-05-19 ENCOUNTER — Inpatient Hospital Stay (HOSPITAL_COMMUNITY): Payer: Medicare HMO

## 2016-05-19 LAB — BASIC METABOLIC PANEL
ANION GAP: 14 (ref 5–15)
BUN: 75 mg/dL — ABNORMAL HIGH (ref 6–20)
CHLORIDE: 116 mmol/L — AB (ref 101–111)
CO2: 16 mmol/L — ABNORMAL LOW (ref 22–32)
Calcium: 8.5 mg/dL — ABNORMAL LOW (ref 8.9–10.3)
Creatinine, Ser: 3.3 mg/dL — ABNORMAL HIGH (ref 0.61–1.24)
GFR, EST AFRICAN AMERICAN: 19 mL/min — AB (ref >60)
GFR, EST NON AFRICAN AMERICAN: 16 mL/min — AB (ref >60)
Glucose, Bld: 157 mg/dL — ABNORMAL HIGH (ref 65–99)
POTASSIUM: 3.5 mmol/L (ref 3.5–5.1)
SODIUM: 146 mmol/L — AB (ref 135–145)

## 2016-05-19 LAB — PHOSPHORUS: PHOSPHORUS: 4.9 mg/dL — AB (ref 2.5–4.6)

## 2016-05-19 LAB — CBC
HCT: 30 % — ABNORMAL LOW (ref 39.0–52.0)
HEMOGLOBIN: 10.2 g/dL — AB (ref 13.0–17.0)
MCH: 31.3 pg (ref 26.0–34.0)
MCHC: 34 g/dL (ref 30.0–36.0)
MCV: 92 fL (ref 78.0–100.0)
PLATELETS: 180 10*3/uL (ref 150–400)
RBC: 3.26 MIL/uL — AB (ref 4.22–5.81)
RDW: 15.6 % — ABNORMAL HIGH (ref 11.5–15.5)
WBC: 12.5 10*3/uL — AB (ref 4.0–10.5)

## 2016-05-19 LAB — MAGNESIUM: MAGNESIUM: 2.2 mg/dL (ref 1.7–2.4)

## 2016-05-19 MED ORDER — FUROSEMIDE 10 MG/ML IJ SOLN
20.0000 mg | Freq: Once | INTRAMUSCULAR | Status: AC
  Administered 2016-05-19: 20 mg via INTRAVENOUS

## 2016-05-19 MED ORDER — POLYETHYLENE GLYCOL 3350 17 G PO PACK
17.0000 g | PACK | Freq: Every day | ORAL | Status: DC
  Filled 2016-05-19 (×2): qty 1

## 2016-05-19 MED ORDER — SODIUM CHLORIDE 0.9 % IV SOLN
30.0000 meq | Freq: Once | INTRAVENOUS | Status: AC
  Administered 2016-05-19: 30 meq via INTRAVENOUS
  Filled 2016-05-19: qty 15

## 2016-05-19 MED ORDER — SODIUM CHLORIDE 0.45 % IV SOLN
INTRAVENOUS | Status: DC
  Administered 2016-05-19: 08:00:00 via INTRAVENOUS
  Filled 2016-05-19 (×2): qty 1000

## 2016-05-19 MED ORDER — POTASSIUM CHLORIDE 20 MEQ PO PACK
40.0000 meq | PACK | Freq: Two times a day (BID) | ORAL | Status: DC
  Filled 2016-05-19 (×2): qty 2

## 2016-05-19 MED ORDER — ORAL CARE MOUTH RINSE
15.0000 mL | Freq: Two times a day (BID) | OROMUCOSAL | Status: DC
  Administered 2016-05-19 – 2016-05-23 (×7): 15 mL via OROMUCOSAL

## 2016-05-19 NOTE — Procedures (Signed)
Extubation Procedure Note  Patient Details:   Name: Kerby Borner DOB: 06-04-1935 MRN: [id number]   Airway Documentation:     Evaluation  O2 sats: stable throughout Complications: No apparent complications Patient did tolerate procedure well. Bilateral Breath Sounds: Clear, Diminished   Yes   Patient extubated to 2L nasal cannula.  Positive cuff leak noted.  No evidence of stridor.  Patient able to speak post extubation.  Sats currently 96%.  Vitals are stable.  No apparent complications.  Alphia Moh N 05/19/2016, 9:15 AM

## 2016-05-19 NOTE — Progress Notes (Signed)
PULMONARY / CRITICAL CARE MEDICINE   Name: Bobby Vega MRN: [id number] DOB: 03/06/1935    ADMISSION DATE:  05/13/2016 CONSULTATION DATE:    REFERRING MD:  EDP  CHIEF COMPLAINT:  Respiratory distress  Brief:   Bobby Vega is a 32M with history of dementia, GERD, HLD, HTN, PVD, prior stroke, prior blood clots, and BPH with urgency, who presents via EMS following syncopal episode. En route patient was confused and had increasing shortness of breath. On arrival to ED patient placed on NRB with sats in the 80s; decision made to intubate. Family (daughter) provides additional history that he had some friends over and was vomiting a lot. He got very pale and they called EMS. His oxygen levels were reportedly a little low, but the patient refused to go with them to the hospital. After EMS left, he had another episode of vomiting and EMS was again called and this time was successful in bringing him in for evaluation. No clear history of syncope. No sick contacts. No known exposures or other complaints related to vomiting. No report of abdominal pain or chest pain. No cough / sputum / fever / chills leading up to this. Head CT is pending. CXR showed bilateral opacities concerning for aspiration. Antibiotics were initiated in the ED with vancomycin and zosyn.   Imaging 11/18 CXR bilateral opacities concerning for asp pneumonia   11/18  head CT without acute intracranial finding   11/20 CXR with persistent bilateral opaciities  11/21 CXR with persistent bilateral opacities, left >right  11/22 CXR with persistent bilateral opacities, left >right. May be improved. ETT 5 cm above carina  11/24 CXR Significant improvement bilateral aeration with significant improvement bilateral pulmonary infiltrates. Hardware at the right place.   CULTURES: 11/20 Blood : NGTD 11/20 Sputum: Normal resp flora 11/23 Sputum: pending. GS rare yeast   ANTIBIOTICS: Vancomycin 11/18 >> 11/22 Zosyn 11/18 >>  11/22 Zithromax 11/20>> 11.22 Unasyn 11/22 > 11/ 25  SIGNIFICANT EVENTS: 11/18 admitted with acute hypoxic resp failure due aspiration pneumonia and intubated  LINES/TUBES: ETT 11/18 >> 2PIV 11/18>> Foley 11/18>>  OGT 11/18>>  SUBJECTIVE: Awake. Moves arms. follows command.  On PS 10/5 this morning. Looks great on PST. Good volumes and good cough.   VITAL SIGNS: BP (!) 110/49   Pulse 61   Temp 98.2 F (36.8 C) (Oral)   Resp (!) 30   Ht 5\' 5"  (1.651 m)   Wt 167 lb 1.7 oz (75.8 kg)   SpO2 99%   BMI 27.81 kg/m   HEMODYNAMICS: Not on pressors  VENTILATOR SETTINGS: Vent Mode: PRVC FiO2 (%):  [30 %] 30 % Set Rate:  [30 bmp] 30 bmp Vt Set:  [580 mL] 580 mL PEEP:  [5 cmH20] 5 cmH20 Plateau Pressure:  [26 cmH20-36 cmH20] 31 cmH20  INTAKE / OUTPUT: I/O last 3 completed shifts: In: 3429.1 [I.V.:1349.1; NG/GT:1830; IV Piggyback:250] Out: 3800 [Urine:3800]  PHYSICAL EXAMINATION:  GEN: awake, intermittently follows command HEENT: arcus seniles CVS: RRR, normal s1 and s2, no murmurs, no edema RESP: on ETT, rhonchi on BLF. No crackles or wheeze GI: Bowel sounds present, soft MSK:no focal tenderness or swelling SKIN: no apparent skin lesion NEURO: awake, follows command, PERRL, moves extremities PSYCH: appropriate mood and affect   LABS:  BMET  Recent Labs Lab 05/17/16 0304 05/18/16 0258 05/19/16 0239  NA 144 143 146*  K 3.3* 3.7 3.5  CL 115* 116* 116*  CO2 18* 15* 16*  BUN 61* 67* 75*  CREATININE 3.33* 3.42*  3.30*  GLUCOSE 144* 207* 157*    Electrolytes  Recent Labs Lab 05/17/16 0304 05/18/16 0258 05/19/16 0239  CALCIUM 8.1* 8.4* 8.5*  MG 2.3 2.3 2.2  PHOS 2.1* 3.9 4.9*    CBC  Recent Labs Lab 05/17/16 0304 05/18/16 0258 05/19/16 0239  WBC 17.1* 14.6* 12.5*  HGB 9.0* 10.4* 10.2*  HCT 26.7* 30.5* 30.0*  PLT 131* 149* 180    Coag's  Recent Labs Lab 05/13/16 1736  INR 1.10    Sepsis Markers  Recent Labs Lab 05/15/16 0934  05/15/16 1104 05/16/16 0204 05/16/16 0850 05/16/16 1120 05/17/16 0304  LATICACIDVEN 2.3*  --   --  2.3* 2.1*  --   PROCALCITON  --  >175.00 >175.00  --   --  >175.00    ABG  Recent Labs Lab 05/15/16 0346 05/16/16 0730 05/17/16 0700  PHART 7.287* 7.434 7.437  PCO2ART 31.2* 26.8* 26.8*  PO2ART 79.0* 100 75.7*    Liver Enzymes  Recent Labs Lab 05/13/16 1736 05/15/16 0431 05/16/16 0204  AST 21 139* 72*  ALT 13* 89* 64*  ALKPHOS 97 53 76  BILITOT 0.5 0.6 0.6  ALBUMIN 3.8 2.4* 2.2*    Cardiac Enzymes No results for input(s): TROPONINI, PROBNP in the last 168 hours.  Glucose  Recent Labs Lab 05/16/16 2006 05/17/16 0005 05/17/16 0417 05/17/16 0819 05/17/16 1131 05/17/16 1619  GLUCAP 137* 119* 143* 131* 151* 129*    DISCUSSION: Bobby Vega is a 60M with PMH of dementia, PVD, prior stroke, prior blood clots, HTN, HLD, GERD, who presents with acute hypoxemic respiratory failure thought secondary to massive aspiration of unknown etiology improved on vanc and zosyn and transitioned to Unasyn.   ASSESSMENT / PLAN:  PULMONARY A: Acute hypoxemic respiratory failure, thought 2/2 aspiration pneumonia. Possible pulm edema.  Diffuse infiltrate/congestion improved.  P:   SAT/SBT. Main barrier to extubation is mental status. Per care giver, pt was conversant with mild dementia, walks around. Per family, this is his baseline. I think he is worse than his baseline. Last 24 hrs >  More ET secretions.  AM CXR On  Vanc, Zosyn and Azithro > switched  to unasyn 11/22 Good response to diuresis. Will d/c after todays dose.   CARDIOVASCULAR A:  Sinus tachycardia: resolved Pulm edema P:  Monitor Continue diuresis. Will d/c after this am's dose  RENAL A:   AKI w/ Cr. 3.13>>3.59>>3.33. (unclear b/l likely <2). Foley placed by urology. Renal US negative Borderline K Mild hyperNa/Cl Metabolic acidosis Lactic acidosis: improved P:   D/c IVF Avoid nephrotoxins D/c lasix  after todays dose.  Daily BMP KCl 40 mEq by tube  GASTROINTESTINAL A:   New transaminitis: improved. CK mildly elevted Nausea and vomiting - unclear etiology - resolved Last BM 11/21 P:   Continue TF Famotidine iv for gi ppx Miralax daily  HEMATOLOGIC A:   Leukocytosis improving Anemia of Chronic disease stable Mild thrombocytopenia P:  Trend CBC Sub q hep  INFECTIOUS A:   Aspiration PNA. Possible; atypical PNA as well.  Leukocytosis Afebrile P:   Switched  to unasyn 11/22 > plan 7 d total abx.  Inc ET secretions 11/23. Sputum culture pending. CXR significantly improved. Less secretions.   ENDOCRINE A:   No issues  P:  Follow sugar with BMP glucose  NEUROLOGIC A:   AMS - likely toxic metabolic encephalopathy- resolved.  Baseline demetia CT head negative P:   RASS goal: 0 to -1 Wua Plan to taper off fentanyl once extubated.  On precedex.   FAMILY  - Updates: no family at bedside.   - Inter-disciplinary family meet or Palliative Care meeting due by:  day 7  Wendee Beavers, MD.  05/19/16 8:06 AM PGY-2    ATTENDING NOTE / ATTESTATION NOTE :   I have discussed the case with the resident/APP Wendee Beavers MD.   I agree with the resident/APP's  history, physical examination, assessment, and plans.    I have edited the above note and modified it according to our agreed history, physical examination, assessment and plan.   I have spent 30  minutes of critical care time with this patient today.  Family :No family at bedside.    Monica Becton, MD 05/19/2016, 8:51 AM [facility] Pager [phone] After 3 pm or if no answer, call [phone]

## 2016-05-19 NOTE — Progress Notes (Signed)
eLink Physician-Brief Progress Note Patient Name: Bobby Vega DOB: 21-Aug-1934 MRN: [id number]   Date of Service  05/19/2016  HPI/Events of Note  Hypokalemia  eICU Interventions  Potassium replaced     Intervention Category Intermediate Interventions: Electrolyte abnormality - evaluation and management  Dorena Dorfman 05/19/2016, 10:25 PM

## 2016-05-19 NOTE — Progress Notes (Signed)
Wasted 125mg  of Fentanyl in sink.  Witnessed by April, RN

## 2016-05-20 LAB — BASIC METABOLIC PANEL
Anion gap: 15 (ref 5–15)
BUN: 69 mg/dL — AB (ref 6–20)
CHLORIDE: 118 mmol/L — AB (ref 101–111)
CO2: 17 mmol/L — ABNORMAL LOW (ref 22–32)
CREATININE: 3.2 mg/dL — AB (ref 0.61–1.24)
Calcium: 8.6 mg/dL — ABNORMAL LOW (ref 8.9–10.3)
GFR calc Af Amer: 20 mL/min — ABNORMAL LOW (ref >60)
GFR calc non Af Amer: 17 mL/min — ABNORMAL LOW (ref >60)
GLUCOSE: 103 mg/dL — AB (ref 65–99)
Potassium: 3.8 mmol/L (ref 3.5–5.1)
SODIUM: 150 mmol/L — AB (ref 135–145)

## 2016-05-20 LAB — CULTURE, BLOOD (ROUTINE X 2)
CULTURE: NO GROWTH
CULTURE: NO GROWTH

## 2016-05-20 LAB — CBC
HCT: 35.1 % — ABNORMAL LOW (ref 39.0–52.0)
Hemoglobin: 11.6 g/dL — ABNORMAL LOW (ref 13.0–17.0)
MCH: 30.8 pg (ref 26.0–34.0)
MCHC: 33 g/dL (ref 30.0–36.0)
MCV: 93.1 fL (ref 78.0–100.0)
PLATELETS: 243 10*3/uL (ref 150–400)
RBC: 3.77 MIL/uL — ABNORMAL LOW (ref 4.22–5.81)
RDW: 15.7 % — AB (ref 11.5–15.5)
WBC: 18.8 10*3/uL — AB (ref 4.0–10.5)

## 2016-05-20 LAB — PHOSPHORUS: Phosphorus: 5.4 mg/dL — ABNORMAL HIGH (ref 2.5–4.6)

## 2016-05-20 LAB — CULTURE, RESPIRATORY W GRAM STAIN

## 2016-05-20 LAB — MAGNESIUM: Magnesium: 2.3 mg/dL (ref 1.7–2.4)

## 2016-05-20 LAB — CULTURE, RESPIRATORY: SPECIAL REQUESTS: NORMAL

## 2016-05-20 MED ORDER — HYDRALAZINE HCL 20 MG/ML IJ SOLN
10.0000 mg | INTRAMUSCULAR | Status: DC | PRN
  Administered 2016-05-20 – 2016-05-22 (×6): 10 mg via INTRAVENOUS
  Filled 2016-05-20 (×6): qty 1

## 2016-05-20 MED ORDER — FENTANYL CITRATE (PF) 100 MCG/2ML IJ SOLN
12.5000 ug | Freq: Once | INTRAMUSCULAR | Status: AC
  Administered 2016-05-20: 12.5 ug via INTRAVENOUS
  Filled 2016-05-20: qty 2

## 2016-05-20 MED ORDER — SODIUM CHLORIDE 0.9 % IV SOLN
10.0000 mg | INTRAVENOUS | Status: DC
  Administered 2016-05-20: 10 mg via INTRAVENOUS
  Filled 2016-05-20 (×2): qty 1

## 2016-05-20 MED ORDER — WHITE PETROLATUM GEL
Status: AC
  Administered 2016-05-20: 14:00:00
  Filled 2016-05-20: qty 1

## 2016-05-20 MED ORDER — DEXTROSE 5 % IV SOLN
INTRAVENOUS | Status: DC
  Administered 2016-05-20 – 2016-05-22 (×4): via INTRAVENOUS

## 2016-05-20 MED ORDER — METOPROLOL TARTRATE 5 MG/5ML IV SOLN
5.0000 mg | Freq: Two times a day (BID) | INTRAVENOUS | Status: DC
  Administered 2016-05-20: 5 mg via INTRAVENOUS
  Filled 2016-05-20: qty 5

## 2016-05-20 NOTE — Progress Notes (Signed)
eLink Physician-Brief Progress Note Patient Name: Bobby Vega DOB: 08-17-1934 MRN: [id number]   Date of Service  05/20/2016  HPI/Events of Note  Pain 'all over' per RN  eICU Interventions  Low dose fent x1  Is npo     Intervention Category Intermediate Interventions: Pain - evaluation and management  ALVA,RAKESH V. 05/20/2016, 7:37 PM

## 2016-05-20 NOTE — Progress Notes (Addendum)
Pt transfered from 76M. Placed on telemetry, [place] notified. Continuous pulse oximetry placed. Pt in stable condition. Call bell within reach. Will continue to monitor.

## 2016-05-20 NOTE — Evaluation (Signed)
Clinical/Bedside Swallow Evaluation Patient Details  Name: Bobby Vega MRN: [id number] Date of Birth: Dec 24, 1934  Today's Date: 05/20/2016 Time: SLP Start Time (ACUTE ONLY): 54 SLP Stop Time (ACUTE ONLY): 0932 SLP Time Calculation (min) (ACUTE ONLY): 17 min  Past Medical History:  Past Medical History:  Diagnosis Date  . Arthritis    hip  . Borderline diabetes   . BPH (benign prostatic hyperplasia)    takes Avodart daily  . Bronchitis    yrs ago  . Cervical spondylosis with myelopathy   . Cervical spondylosis without myelopathy   . Chronic back pain   . Constipation    Metamucil prn  . Diverticulosis   . ED (erectile dysfunction)    takes Cialis as needed as well as Viagra  . Embolism - blood clot    in stomach  . GERD (gastroesophageal reflux disease)    takes Nexium daily  . Hyperlipidemia    takes Zocor nightly  . Hypertension    takes Losartan daily  . Lumbar spondylosis with myelopathy   . Lumbar spondylosis with myelopathy   . Nocturia   . Pain in joint, hand   . Peripheral vascular disease ([place])   . Stroke ([place])   . Urinary urgency    Past Surgical History:  Past Surgical History:  Procedure Laterality Date  . ABDOMINAL AORTAGRAM N/A 07/04/2011   Procedure: ABDOMINAL Maxcine Ham;  Surgeon: Serafina Mitchell, MD;  Location: [facility] CATH LAB;  Service: Cardiovascular;  Laterality: N/A;  . AORTA - BILATERAL FEMORAL ARTERY BYPASS GRAFT  07/13/2011   Procedure: AORTA BIFEMORAL BYPASS GRAFT;  Surgeon: Theotis Burrow, MD;  Location: [place];  Service: Vascular;  Laterality: Bilateral;  . CYSTOSCOPY  07/13/2011   Procedure: CYSTOSCOPY;  Surgeon: Hanley Ben, MD;  Location: [place];  Service: Urology;  Laterality: N/A;  cystoscopy,dilatation & insertion of councill foley catheter  . LOWER EXTREMITY ANGIOGRAM Bilateral 07/04/2011   Procedure: LOWER EXTREMITY ANGIOGRAM;  Surgeon: Serafina Mitchell, MD;  Location[facility] CATH LAB;  Service: Cardiovascular;  Laterality: Bilateral;  .  SKIN GRAFT  1983   HPI:  Bobby Vega is a 79M with history of dementia, GERD, HLD, HTN, PVD, prior stroke, prior blood clots, and BPH with urgency, who presents via EMS following syncopal episode. En route patient was confused and had increasing shortness of breath. On arrival to ED patient placed on NRB with sats in the 80s; decision made to intubate. Family (daughter) provides additional history that he had some friends over and was vomiting a lot. He got very pale and they called EMS. His oxygen levels were reportedly a little low, but the patient refused to go with them to the hospital. After EMS left, he had another episode of vomiting and EMS was again called and this time was successful in bringing him in for evaluation.  CXR showed bilateral opacities concerning for aspiration. Intubated from 11/18 to 11/24.    Assessment / Plan / Recommendation Clinical Impression  Pt demosntrates high risk for aspiration due to prolonged intubation, weakness and confusion. Pt sees POs and accepts them by opening mouth or puckering lips, but labial seal is weak, liquids and purees spill anteriorally. Pt orally holds bolsues with minimal manipulation until max verbal cues are given to transit and swallow PO. Swallow is subjectively weak. No coughing or throat clearing observed, but pt at high risk for silent aspiration following intubation. Given presentation, recommend ongoing NPO status until strength and responsiveness improve. Will follow for readiness.  Aspiration Risk  Severe aspiration risk    Diet Recommendation NPO        Other  Recommendations Oral Care Recommendations: Oral care QID   Follow up Recommendations Skilled Nursing facility      Frequency and Duration min 2x/week  2 weeks       Prognosis Prognosis for Safe Diet Advancement: Good Barriers to Reach Goals: Cognitive deficits      Swallow Study   General HPI: Bobby Vega is a 89M with history of dementia, GERD, HLD, HTN,  PVD, prior stroke, prior blood clots, and BPH with urgency, who presents via EMS following syncopal episode. En route patient was confused and had increasing shortness of breath. On arrival to ED patient placed on NRB with sats in the 80s; decision made to intubate. Family (daughter) provides additional history that he had some friends over and was vomiting a lot. He got very pale and they called EMS. His oxygen levels were reportedly a little low, but the patient refused to go with them to the hospital. After EMS left, he had another episode of vomiting and EMS was again called and this time was successful in bringing him in for evaluation.  CXR showed bilateral opacities concerning for aspiration. Intubated from 11/18 to 11/24.  Type of Study: Bedside Swallow Evaluation Previous Swallow Assessment: none Diet Prior to this Study: NPO Temperature Spikes Noted: No Respiratory Status: Nasal cannula History of Recent Intubation: Yes Length of Intubations (days): 6 days Date extubated: 05/19/16 Behavior/Cognition: Alert;Distractible Oral Cavity Assessment: Within Functional Limits Oral Care Completed by SLP: Yes Oral Cavity - Dentition: Edentulous Vision: Functional for self-feeding Self-Feeding Abilities: Total assist Patient Positioning: Upright in bed Baseline Vocal Quality: Low vocal intensity Volitional Cough: Weak Volitional Swallow: Able to elicit    Oral/Motor/Sensory Function Overall Oral Motor/Sensory Function: Within functional limits   Ice Chips Ice chips: Impaired Presentation: Spoon Oral Phase Impairments: Reduced labial seal Oral Phase Functional Implications: Left anterior spillage;Prolonged oral transit;Oral residue Pharyngeal Phase Impairments: Suspected delayed Swallow;Decreased hyoid-laryngeal movement   Thin Liquid Thin Liquid: Impaired Presentation: Cup Oral Phase Impairments: Reduced labial seal;Reduced lingual movement/coordination Oral Phase Functional Implications:  Oral residue;Prolonged oral transit;Left anterior spillage;Right anterior spillage Pharyngeal  Phase Impairments: Suspected delayed Swallow;Decreased hyoid-laryngeal movement    Nectar Thick Nectar Thick Liquid: Not tested   Honey Thick Honey Thick Liquid: Not tested   Puree Puree: Impaired Presentation: Spoon Oral Phase Impairments: Poor awareness of bolus;Reduced labial seal;Reduced lingual movement/coordination Oral Phase Functional Implications: Oral holding;Prolonged oral transit;Right anterior spillage;Left anterior spillage   Solid   GO   Solid: Not tested       Herbie Baltimore, MA CCC-SLP [phone]  Rehaan Viloria, Katherene Ponto 05/20/2016,9:58 AM

## 2016-05-20 NOTE — Progress Notes (Signed)
Pt transferred to [id number] with NT. Telemetry to be placed by bedside RN, Ellard Artis. Ellard Artis, RN and NT present. All questions answered. Pt in no signs of distress, with no c/o pain.

## 2016-05-20 NOTE — Progress Notes (Signed)
eLink Physician-Brief Progress Note Patient Name: Bobby Vega DOB: 11-24-1934 MRN: [id number]   Date of Service  05/20/2016  HPI/Events of Note  8 beat run of VT hypertensive  eICU Interventions  Lopressor 5 q 12 Lytes nml     Intervention Category Major Interventions: Arrhythmia - evaluation and management  Muhannad [name] 05/20/2016, 9:51 PM

## 2016-05-20 NOTE — Progress Notes (Addendum)
PULMONARY / CRITICAL CARE MEDICINE   Name: Bobby Vega MRN: [id number] DOB: Nov 14, 1934    ADMISSION DATE:  05/13/2016 CONSULTATION DATE:    REFERRING MD:  EDP  CHIEF COMPLAINT:  Respiratory distress  Brief:   Bobby Vega is a 67M with history of dementia, GERD, HLD, HTN, PVD, prior stroke, prior blood clots, and BPH with urgency, who presents via EMS following syncopal episode. En route patient was confused and had increasing shortness of breath. On arrival to ED patient placed on NRB with sats in the 80s; decision made to intubate. Family (daughter) provides additional history that he had some friends over and was vomiting a lot. He got very pale and they called EMS. His oxygen levels were reportedly a little low, but the patient refused to go with them to the hospital. After EMS left, he had another episode of vomiting and EMS was again called and this time was successful in bringing him in for evaluation. No clear history of syncope. No sick contacts. No known exposures or other complaints related to vomiting. No report of abdominal pain or chest pain. No cough / sputum / fever / chills leading up to this. Head CT is pending. CXR showed bilateral opacities concerning for aspiration. Antibiotics were initiated in the ED with vancomycin and zosyn.   Imaging 11/18 CXR bilateral opacities concerning for asp pneumonia   11/18  head CT without acute intracranial finding   11/20 CXR with persistent bilateral opaciities  11/21 CXR with persistent bilateral opacities, left >right  11/22 CXR with persistent bilateral opacities, left >right. May be improved. ETT 5 cm above carina  11/24 CXR Significant improvement bilateral aeration with significant improvement bilateral pulmonary infiltrates. Hardware at the right place.   CULTURES: 11/20 Blood : NGTD 11/20 Sputum: Normal resp flora 11/23 Sputum: pending. GS rare yeast   ANTIBIOTICS: Vancomycin 11/18 >> 11/22 Zosyn 11/18 >>  11/22 Zithromax 11/20>> 11.22 Unasyn 11/22 > 11/ 25  SIGNIFICANT EVENTS: 11/18 admitted with acute hypoxic resp failure due aspiration pneumonia and intubated  LINES/TUBES: ETT 11/18 >> 2PIV 11/18>> Foley 11/18>>  OGT 11/18>>  SUBJECTIVE: Tolerating extubation. Comfortable. Frequent PVC overnight per RN.    VITAL SIGNS: BP (!) 165/81 (BP Location: Right Arm)   Pulse 99   Temp 98.5 F (36.9 C) (Oral)   Resp (!) 25   Ht 5\' 5"  (1.651 m)   Wt 75.8 kg (167 lb 1.7 oz)   SpO2 100%   BMI 27.81 kg/m   HEMODYNAMICS: Not on pressors  VENTILATOR SETTINGS:    INTAKE / OUTPUT: I/O last 3 completed shifts: In: 2453.5 [P.O.:300; I.V.:756; NG/GT:832.5; IV Piggyback:565] Out: 4098 [Urine:3355; Stool:500]  PHYSICAL EXAMINATION:  GEN: awake, comfortable. Follows commands.  HEENT: arcus seniles CVS: RRR, normal s1 and s2, no murmurs, no edema RESP: fair ae. Some rhonchi at bases.  GI: Bowel sounds present, soft MSK:no focal tenderness or swelling SKIN: no apparent skin lesion NEURO: awake, follows command, PERRL, moves extremities PSYCH: appropriate mood and affect   LABS:  BMET  Recent Labs Lab 05/18/16 0258 05/19/16 0239 05/20/16 0316  NA 143 146* 150*  K 3.7 3.5 3.8  CL 116* 116* 118*  CO2 15* 16* 17*  BUN 67* 75* 69*  CREATININE 3.42* 3.30* 3.20*  GLUCOSE 207* 157* 103*    Electrolytes  Recent Labs Lab 05/18/16 0258 05/19/16 0239 05/20/16 0316  CALCIUM 8.4* 8.5* 8.6*  MG 2.3 2.2 2.3  PHOS 3.9 4.9* 5.4*    CBC  Recent Labs  Lab 05/18/16 0258 05/19/16 0239 05/20/16 0316  WBC 14.6* 12.5* 18.8*  HGB 10.4* 10.2* 11.6*  HCT 30.5* 30.0* 35.1*  PLT 149* 180 243    Coag's  Recent Labs Lab 05/13/16 1736  INR 1.10    Sepsis Markers  Recent Labs Lab 05/15/16 0934 05/15/16 1104 05/16/16 0204 05/16/16 0850 05/16/16 1120 05/17/16 0304  LATICACIDVEN 2.3*  --   --  2.3* 2.1*  --   PROCALCITON  --  >175.00 >175.00  --   --  >175.00     ABG  Recent Labs Lab 05/15/16 0346 05/16/16 0730 05/17/16 0700  PHART 7.287* 7.434 7.437  PCO2ART 31.2* 26.8* 26.8*  PO2ART 79.0* 100 75.7*    Liver Enzymes  Recent Labs Lab 05/13/16 1736 05/15/16 0431 05/16/16 0204  AST 21 139* 72*  ALT 13* 89* 64*  ALKPHOS 97 53 76  BILITOT 0.5 0.6 0.6  ALBUMIN 3.8 2.4* 2.2*    Cardiac Enzymes No results for input(s): TROPONINI, PROBNP in the last 168 hours.  Glucose  Recent Labs Lab 05/16/16 2006 05/17/16 0005 05/17/16 0417 05/17/16 0819 05/17/16 1131 05/17/16 1619  GLUCAP 137* 119* 143* 131* 151* 129*    DISCUSSION: Bobby Vega is a 54M with PMH of dementia, PVD, prior stroke, prior blood clots, HTN, HLD, GERD, who presents with acute hypoxemic respiratory failure thought secondary to massive aspiration of unknown etiology improved on vanc and zosyn and transitioned to Unasyn.   ASSESSMENT / PLAN:  PULMONARY A: S/P Acute hypoxemic respiratory failure, thought 2/2 aspiration pneumonia. Possible pulm edema. Extubated on 11/24 Diffuse infiltrate/congestion improved.  P:   Tolerating extubation.  On  Vanc, Zosyn and Azithro > switched  to unasyn 11/22 > d/c after today. Cultures (-).  S/P lasix/diuresis > seems euvolemic.   CARDIOVASCULAR A:  Sinus tachycardia: resolved HTN Pulm edema Frequent PVCs 11/24 P:  Monitor Off lasix.  PRN hydralazine > will inc the frequency. Start PO meds if he passes swallow evaln.  Observe. Keep euvolemic.   RENAL A:   AKI. Unsure baseline. Foley placed by urology. Renal US negative Metabolic acidosis > better Lactic acidosis: improved. Hypernatremia 2/2 diuresis P:   Keep euvolemic. If he fails swallow evaln, will need D5 W 1L over 24 hrs.    GASTROINTESTINAL A:   New transaminitis: improved.  Rhabdomyolysis Nausea and vomiting - unclear etiology - resolved Last BM 11/21 P:   Swallow evaln. If he fails, will need D5 W over 24 hrs. Has hypernatremia and  rhabdo Check CPKs.  Famotidine iv for gi ppx Miralax daily  HEMATOLOGIC A:   Leukocytosis improving Anemia of Chronic disease stable Mild thrombocytopenia P:  Trend CBC Sub q hep  INFECTIOUS A:   Aspiration PNA. Possible; atypical PNA as well.  Leukocytosis Afebrile P:   Would have finished 7d Abx by 11/25 (Unasyn/Vanc/Zosyn/Azithro)   ENDOCRINE A:   No issues  P:  Follow sugar with BMP glucose  NEUROLOGIC A:   AMS - likely toxic metabolic encephalopathy- resolved.  Baseline demetia CT head negative P:   RASS goal: 0   FAMILY  - Updates: no family at bedside.   - Inter-disciplinary family meet or Palliative Care meeting due by:  day 7  Plan to transfer to SDU today. Case d/w Dr. Thereasa Solo. Pt will be on [facility] service starting 11/26, PCCM off then unless with issues overnight.   Monica Becton, MD 05/20/2016, 8:06 AM [facility] Pager [phone] After 3  pm or if no answer, call [phone]

## 2016-05-21 DIAGNOSIS — E870 Hyperosmolality and hypernatremia: Secondary | ICD-10-CM

## 2016-05-21 LAB — CBC
HEMATOCRIT: 37.9 % — AB (ref 39.0–52.0)
HEMOGLOBIN: 12.5 g/dL — AB (ref 13.0–17.0)
MCH: 31.3 pg (ref 26.0–34.0)
MCHC: 33 g/dL (ref 30.0–36.0)
MCV: 94.8 fL (ref 78.0–100.0)
Platelets: 312 10*3/uL (ref 150–400)
RBC: 4 MIL/uL — AB (ref 4.22–5.81)
RDW: 16.1 % — ABNORMAL HIGH (ref 11.5–15.5)
WBC: 17.5 10*3/uL — AB (ref 4.0–10.5)

## 2016-05-21 LAB — BASIC METABOLIC PANEL
ANION GAP: 13 (ref 5–15)
BUN: 58 mg/dL — AB (ref 6–20)
CHLORIDE: 119 mmol/L — AB (ref 101–111)
CO2: 19 mmol/L — ABNORMAL LOW (ref 22–32)
Calcium: 9 mg/dL (ref 8.9–10.3)
Creatinine, Ser: 2.61 mg/dL — ABNORMAL HIGH (ref 0.61–1.24)
GFR calc non Af Amer: 22 mL/min — ABNORMAL LOW (ref >60)
GFR, EST AFRICAN AMERICAN: 25 mL/min — AB (ref >60)
Glucose, Bld: 113 mg/dL — ABNORMAL HIGH (ref 65–99)
POTASSIUM: 3.8 mmol/L (ref 3.5–5.1)
SODIUM: 151 mmol/L — AB (ref 135–145)

## 2016-05-21 LAB — MAGNESIUM: Magnesium: 2.5 mg/dL — ABNORMAL HIGH (ref 1.7–2.4)

## 2016-05-21 LAB — PHOSPHORUS: PHOSPHORUS: 4.6 mg/dL (ref 2.5–4.6)

## 2016-05-21 LAB — CK: CK TOTAL: 71 U/L (ref 49–397)

## 2016-05-21 MED ORDER — DEXTROSE 5 % IV SOLN
500.0000 mg | Freq: Three times a day (TID) | INTRAVENOUS | Status: DC | PRN
  Administered 2016-05-21: 500 mg via INTRAVENOUS
  Filled 2016-05-21 (×3): qty 5

## 2016-05-21 MED ORDER — ACETAMINOPHEN 325 MG PO TABS
650.0000 mg | ORAL_TABLET | Freq: Three times a day (TID) | ORAL | Status: DC | PRN
  Administered 2016-05-21: 650 mg via ORAL
  Filled 2016-05-21: qty 2

## 2016-05-21 MED ORDER — FENTANYL CITRATE (PF) 100 MCG/2ML IJ SOLN
50.0000 ug | INTRAMUSCULAR | Status: DC | PRN
  Administered 2016-05-21 – 2016-05-22 (×2): 50 ug via INTRAVENOUS
  Filled 2016-05-21 (×2): qty 2

## 2016-05-21 MED ORDER — CARVEDILOL 6.25 MG PO TABS
6.2500 mg | ORAL_TABLET | Freq: Two times a day (BID) | ORAL | Status: DC
  Administered 2016-05-21 – 2016-05-23 (×4): 6.25 mg via ORAL
  Filled 2016-05-21 (×4): qty 1

## 2016-05-21 MED ORDER — CLOPIDOGREL BISULFATE 75 MG PO TABS
75.0000 mg | ORAL_TABLET | Freq: Every day | ORAL | Status: DC
  Administered 2016-05-22 – 2016-05-23 (×2): 75 mg via ORAL
  Filled 2016-05-21 (×2): qty 1

## 2016-05-21 MED ORDER — LABETALOL HCL 5 MG/ML IV SOLN
10.0000 mg | INTRAVENOUS | Status: DC | PRN
  Administered 2016-05-21 – 2016-05-22 (×3): 10 mg via INTRAVENOUS
  Filled 2016-05-21 (×3): qty 4

## 2016-05-21 MED ORDER — OXYCODONE HCL 5 MG PO TABS
5.0000 mg | ORAL_TABLET | ORAL | Status: DC | PRN
  Administered 2016-05-21 – 2016-05-23 (×4): 5 mg via ORAL
  Filled 2016-05-21 (×5): qty 1

## 2016-05-21 MED ORDER — ACETAMINOPHEN 650 MG RE SUPP: 650.0000 mg | Freq: Three times a day (TID) | RECTAL | Status: DC | PRN

## 2016-05-21 MED ORDER — PANTOPRAZOLE SODIUM 40 MG PO TBEC
40.0000 mg | DELAYED_RELEASE_TABLET | Freq: Every day | ORAL | Status: DC
  Administered 2016-05-21 – 2016-05-23 (×3): 40 mg via ORAL
  Filled 2016-05-21 (×3): qty 1

## 2016-05-21 MED ORDER — ACETAMINOPHEN 500 MG PO TABS
1000.0000 mg | ORAL_TABLET | Freq: Three times a day (TID) | ORAL | Status: DC
  Administered 2016-05-21 – 2016-05-23 (×5): 1000 mg via ORAL
  Filled 2016-05-21 (×5): qty 2

## 2016-05-21 MED ORDER — FENTANYL CITRATE (PF) 100 MCG/2ML IJ SOLN
12.5000 ug | INTRAMUSCULAR | Status: DC | PRN
  Administered 2016-05-21: 25 ug via INTRAVENOUS
  Filled 2016-05-21: qty 2

## 2016-05-21 NOTE — Progress Notes (Signed)
Speech Language Pathology Treatment: Dysphagia  Patient Details Name: Bobby Vega MRN: [id number] DOB: 04-Oct-1934 Today's Date: 05/21/2016 Time: 7253-6644 SLP Time Calculation (min) (ACUTE ONLY): 13 min  Assessment / Plan / Recommendation Clinical Impression  Pt has increased automaticity with oral intake today, requiring Mod cues for pacing to slow his rate of consumption. No overt s/s of aspiration were noted with thin liquids or purees, and his vocal quality seems strong. Attempted trials of soft solids, but pt declined them since he did not have his dentures and he thought it would be painful. Recommend initiating Dys 1 diet and thin liquids but with full supervision for safety. Will f/u for tolerance and possible advancement if dentures can be brought into the hospital.   HPI HPI: Bobby Vega is a 4M with history of dementia, GERD, HLD, HTN, PVD, prior stroke, prior blood clots, and BPH with urgency, who presents via EMS following syncopal episode. En route patient was confused and had increasing shortness of breath. On arrival to ED patient placed on NRB with sats in the 80s; decision made to intubate. Family (daughter) provides additional history that he had some friends over and was vomiting a lot. He got very pale and they called EMS. His oxygen levels were reportedly a little low, but the patient refused to go with them to the hospital. After EMS left, he had another episode of vomiting and EMS was again called and this time was successful in bringing him in for evaluation.  CXR showed bilateral opacities concerning for aspiration. Intubated from 11/18 to 11/24.       SLP Plan  Continue with current plan of care     Recommendations  Diet recommendations: Dysphagia 1 (puree);Thin liquid Liquids provided via: Cup;Straw Medication Administration: Whole meds with puree Supervision: Patient able to self feed;Full supervision/cueing for compensatory strategies Compensations: Slow  rate;Small sips/bites Postural Changes and/or Swallow Maneuvers: Seated upright 90 degrees                Oral Care Recommendations: Oral care BID Follow up Recommendations: Skilled Nursing facility Plan: Continue with current plan of care       GO                Germain Osgood 05/21/2016, 10:20 AM  Germain Osgood, M.A. CCC-SLP [phone]

## 2016-05-21 NOTE — Evaluation (Signed)
Physical Therapy Evaluation Patient Details Name: Bobby Vega MRN: [id number] DOB: 1935-06-13 Today's Date: 05/21/2016   History of Present Illness  Patient is a 81 y.o. male with history of prior CVA and mild right-sided deficits,  Dementia, hypertension, dyslipidemia, history of peripheral vascular disease-status post aortofemoral bypass in 2013 chronic kidney disease stage III brought to the ED after he sustained a syncopal episode at home following which he started vomiting, he was noted to be short of breath. He was found to be hypoxic in the emergency room and was intubated. He was thought to have acute hypoxic respiratory failure from presumed aspiration pneumonia  Clinical Impression  Patient present with dependencies in tranfers and mobility mostly due to pain.  Feel patient will benefit from PT to increase independence and mobility for possible ultimate return to home.  Feel patient will benefit from CIR stay for pain control as well as therapies to progress independence.      Follow Up Recommendations CIR    Equipment Recommendations  Rolling walker with 5" wheels    Recommendations for Other Services Rehab consult     Precautions / Restrictions Precautions Precautions: Fall      Mobility  Bed Mobility Overal bed mobility: Needs Assistance Bed Mobility: Rolling;Supine to Sit;Sit to Supine Rolling: Modified independent (Device/Increase time) (used railing)   Supine to sit: Mod assist Sit to supine: Min assist   General bed mobility comments: assist with shoulders and legs  Transfers Overall transfer level: Needs assistance Equipment used: Rolling walker (2 wheeled) Transfers: Sit to/from Stand Sit to Stand: Min assist         General transfer comment: assist for safety  Ambulation/Gait             General Gait Details: unable to ambuate - patient reports he is in too much pain and returns to bed.  Stairs            Wheelchair Mobility     Modified Rankin (Stroke Patients Only)       Balance Overall balance assessment: Needs assistance Sitting-balance support: Bilateral upper extremity supported Sitting balance-Leahy Scale: Poor     Standing balance support: Bilateral upper extremity supported Standing balance-Leahy Scale: Poor Standing balance comment: reliant on RW due to pain                             Pertinent Vitals/Pain Pain Assessment: 0-10 Pain Score: 10-Worst pain ever Pain Location: back Pain Descriptors / Indicators: Grimacing Pain Intervention(s): Limited activity within patient's tolerance;Monitored during session;Repositioned;Patient requesting pain meds-RN notified    Home Living Family/patient expects to be discharged to:: Private residence Living Arrangements: Alone Available Help at Discharge: Family (unsure how available family is) Type of Home: House Home Access: Stairs to enter   Technical brewer of Steps: 3 Home Layout: One level Home Equipment: None      Prior Function Level of Independence: Independent               Hand Dominance        Extremity/Trunk Assessment   Upper Extremity Assessment: Generalized weakness           Lower Extremity Assessment: Generalized weakness         Communication   Communication: No difficulties  Cognition Arousal/Alertness: Awake/alert Behavior During Therapy: Restless Overall Cognitive Status: No family/caregiver present to determine baseline cognitive functioning Area of Impairment: Orientation Orientation Level: Time;Situation  General Comments      Exercises     Assessment/Plan    PT Assessment Patient needs continued PT services  PT Problem List Decreased strength;Decreased activity tolerance;Decreased balance;Decreased cognition;Decreased mobility;Pain          PT Treatment Interventions DME instruction;Gait training;Functional mobility training;Balance  training;Therapeutic activities;Therapeutic exercise;Patient/family education    PT Goals (Current goals can be found in the Care Plan section)  Acute Rehab PT Goals Patient Stated Goal: stop hurting PT Goal Formulation: With patient Time For Goal Achievement: 05/27/16 Potential to Achieve Goals: Good    Frequency Min 3X/week   Barriers to discharge Decreased caregiver support      Co-evaluation               End of Session Equipment Utilized During Treatment: Gait belt Activity Tolerance: Patient limited by pain Patient left: in bed;with bed alarm set;with call bell/phone within reach Nurse Communication: Mobility status;Patient requests pain meds         Time: 1135-1155 PT Time Calculation (min) (ACUTE ONLY): 20 min   Charges:   PT Evaluation $PT Eval Moderate Complexity: 1 Procedure     PT G CodesShanna Vega 05/21/2016, 12:56 PM  05/21/2016 Bobby Vega, [name]

## 2016-05-21 NOTE — Progress Notes (Signed)
eLink Physician-Brief Progress Note Patient Name: Bobby Vega DOB: [birth year] MRN: [id number]   Date of Service  05/21/2016  HPI/Events of Note  Continued hypertension despite PRN hydralazine 10 mg IV and then lopressor 5 mg IV.  eICU Interventions  Nurse to continue to use hydralazine q2hours IV Labetalol 10 mg IV q2 hours PRN ordered in addition     Intervention Category Major Interventions: Hypertension - evaluation and management  DETERDING,ELIZABETH 05/21/2016, 1:00 AM

## 2016-05-21 NOTE — Progress Notes (Signed)
Inpatient Rehabilitation  Per PT request patient was screened by Gunnar Fusi for appropriateness for an Inpatient Acute Rehab consult.  Note that a consult order has already been placed.  Please plan for an Admission Coordinator to follow up tomorrow.    Carmelia Roller., CCC/SLP Admission Coordinator  [facility]  Cell [phone]

## 2016-05-21 NOTE — Progress Notes (Signed)
RN received call from central telemetry at 2129. Patient had 1 PVC followed by an 8-beat run of V-tach. BP at the time was 192/90 with HR of 87. RN called Elink. Alva,MD returned page and placed order for 5mg  lopressor q12 hours. PRN hydralazine was given at 2138 with no change in BP afterwards. 5mg  of Lopressor was given at 2352 with BP continuously running in the 021'R systolically afterwards. Paged ELink again about BP being consistently elevated in the 173'V systolically. Deterding,MD returned page with orders to continue to use the PRN hydralazine as needed. MD also placed a PRN order for q2 hours 10mg  labetalol. PRN labetalol given at 0107. At 0145, BP came down to 168/78 with HR in the 60's to 70's. Will continue to monitor and treat per MD orders.

## 2016-05-21 NOTE — Progress Notes (Addendum)
PROGRESS NOTE        PATIENT DETAILS Name: Bobby Vega Age: 81 y.o. Sex: male Date of Birth: May 24, 1935 Admit Date: 05/13/2016 Admitting Physician Raylene Miyamoto, MD FFM:[id number] A Avbuere, MD  Brief Narrative: Patient is a 80 y.o. male with history of prior CVA and mild right-sided deficits,  Dementia, hypertension, dyslipidemia, history of peripheral vascular disease-status post aortofemoral bypass in 2013 chronic kidney disease stage III brought to the ED after he sustained a syncopal episode at home following which he started vomiting, he was noted to be short of breath. He was found to be hypoxic in the emergency room and was intubated. He was thought to have acute hypoxic respiratory failure from presumed aspiration pneumonia. While in the ED and in the ICU, Foley catheter placement was unsuccessful, subsequently urology was consulted, a cystoscopy was performed which showed a moderate to high-grade bulbar urethral stricture that was dilated, subsequently a Foley catheter was placed. Patient was managed in the intensive care unit, upon stability was transferred to the hospitalist service on 11/26. Please see below for further details  Subjective: Lying comfortably in bed-appears chronically ill and debilitated. Wanting something to eat.  Assessment/Plan: Acute hypoxemic respiratory failure: Secondary to aspiration pneumonia, intubated on 11/18-extubated on 11/24. Currently doing well on room air.  Aspiration pneumonia: Clinically improved, has completed a course of antibiotics. Afebrile but still has significant leukocytosis. Plans are to continue to monitor off antibiotics and follow clinical course. Blood/sputum cultures negative.  Acute on chronic kidney disease stage III: Acute renal failure likely multifactorial-probably some element of obstructive uropathy (urethral stricture dilated by urology on admission) and some element of hemodynamically mediated  prerenal azotemia. Creatinine slowly down trending, continue Foley catheter for now, per urology okay to attempt a voiding trial prior to discharge. Will need outpatient follow-up with urology. Monitor electrolytes.  Acute metabolic encephalopathy: Resolved. Secondary to hypoxia and aspiration pneumonia.CT head on admission was negative for acute abnormalities.  Acute on chronic diastolic heart failure: Required several doses of intravenous furosemide while in the intensive care unit-currently euvolemic. Echo on 7/21 showed EF around 55-60%, with grade 1 diastolic dysfunction.  Hypernatremia: Likely secondary to poor oral intake and diuresis-was nothing by mouth for the past several days-as he failed a swallow evaluation. Reevaluated by speech therapy on 11/26, started on a dysphagia 1 diet. Continue D5W and follow electrolytes.  Minimal transaminitis: Suspect secondary to rhabdomyolysis, resolving. Recheck periodically.  Rhabdomyolysis: Resolved, CK has now normalized.  Low Back Pain: complains of pain in his lower back, legs-suspect musculoskeletal etiology-no leg weakness-supportive care for now-if symptoms persists may need further imaging  Syncope: Occurred prior to being brought to the emergency room-suspect vasovagal-as it occurred in the setting of vomiting. Reviewed telemetry strips-no major arrhythmias seen-except for PVCs, echocardiogram showed preserved EF. Continue telemetry monitoring.  Hypertension: Since oral intake has been resumed-will start Coreg and follow BP trend  History of stroke/peripheral vascular disease: Resume antiplatelet agents, recheck LFTs-if transaminitis improving-consider restarting statin now the next few days.  GERD: Continue PPI  Urethral stricture: Seen by urology on admission-dilated under cystoscopy-subsequently Foley catheter placed. Recommendations are to proceed with a voiding trial prior to discharge. Please ensure outpatient follow-up with  urology  Dysphagia: Probably secondary to generalized weakness/muscle weakness due to acute illness-no sure if he has some amount of dysphagia at baseline even prior history  of dementia and CVA. Speech therapy following, started on dysphagia 1 diet-plans are to follow clinically.   Generalized deconditioning/acute on chronic debility: Probably secondary to acute illness-PT evaluation pending-suspect will require SNF on discharge.  Probable history of dementia: Currently awake and alert  DVT Prophylaxis: Prophylactic Heparin   Code Status: Full code   Family Communication: None at bedside  Disposition Plan: Remain inpatient-but will plan on discharge  Antimicrobial agents: See below  Procedures: ETT 11/18 >>11/24 2PIV 11/18>> Foley 11/18>>  OGT 11/18>> TTE 11/21>>EF 55-60%  CONSULTS:  pulmonary/intensive care  Time spent: 25 minutes-Greater than 50% of this time was spent in counseling, explanation of diagnosis, planning of further management, and coordination of care.  MEDICATIONS: Anti-infectives    Start     Dose/Rate Route Frequency Ordered Stop   05/17/16 1800  vancomycin (VANCOCIN) IVPB 750 mg/150 ml premix  Status:  Discontinued     750 mg 150 mL/hr over 60 Minutes Intravenous Every 48 hours 05/15/16 1823 05/17/16 0910   05/17/16 1300  Ampicillin-Sulbactam (UNASYN) 3 g in sodium chloride 0.9 % 100 mL IVPB     3 g 200 mL/hr over 30 Minutes Intravenous Every 12 hours 05/17/16 0915 05/20/16 1253   05/16/16 2200  piperacillin-tazobactam (ZOSYN) IVPB 3.375 g  Status:  Discontinued     3.375 g 12.5 mL/hr over 240 Minutes Intravenous Every 8 hours 05/16/16 1530 05/16/16 1538   05/16/16 2200  piperacillin-tazobactam (ZOSYN) IVPB 2.25 g  Status:  Discontinued     2.25 g 100 mL/hr over 30 Minutes Intravenous Every 8 hours 05/16/16 1538 05/17/16 0910   05/15/16 1100  azithromycin (ZITHROMAX) 200 MG/5ML suspension 500 mg     500 mg Per Tube Daily 05/15/16 1031 05/17/16  0944   05/14/16 1800  vancomycin (VANCOCIN) IVPB 750 mg/150 ml premix  Status:  Discontinued     750 mg 150 mL/hr over 60 Minutes Intravenous Every 24 hours 05/13/16 1849 05/15/16 1823   05/14/16 0600  piperacillin-tazobactam (ZOSYN) IVPB 2.25 g  Status:  Discontinued     2.25 g 100 mL/hr over 30 Minutes Intravenous Every 8 hours 05/13/16 1849 05/16/16 1530   05/13/16 1930  vancomycin (VANCOCIN) 1,500 mg in sodium chloride 0.9 % 500 mL IVPB     1,500 mg 250 mL/hr over 120 Minutes Intravenous  Once 05/13/16 1849 05/13/16 2127   05/13/16 1900  piperacillin-tazobactam (ZOSYN) IVPB 3.375 g     3.375 g 100 mL/hr over 30 Minutes Intravenous  Once 05/13/16 1849 05/13/16 2026      Scheduled Meds: . famotidine (PEPCID) IV  10 mg Intravenous Q24H  . heparin subcutaneous  5,000 Units Subcutaneous Q8H  . mouth rinse  15 mL Mouth Rinse BID  . metoprolol  5 mg Intravenous Q12H   Continuous Infusions: . dextrose 900 mL (05/21/16 0848)  . sodium chloride 0.45 % 1,000 mL infusion 10 mL/hr at 05/19/16 0815   PRN Meds:.acetaminophen, fentaNYL (SUBLIMAZE) injection, hydrALAZINE, labetalol   PHYSICAL EXAM: Vital signs: Vitals:   05/21/16 0055 05/21/16 0145 05/21/16 0532 05/21/16 0621  BP: (!) 181/79 (!) 168/78 (!) 198/84 (!) 168/82  Pulse: 80 74 75   Resp:   18   Temp:   97.6 F (36.4 C)   TempSrc:      SpO2: 90%  99%   Weight:   72.4 kg (159 lb 9.6 oz)   Height:       Filed Weights   05/19/16 0401 05/20/16 0500 05/21/16 0532  Weight: 75.8 kg (  167 lb 1.7 oz) 75.8 kg (167 lb 1.7 oz) 72.4 kg (159 lb 9.6 oz)   Body mass index is 26.56 kg/m.   General appearance :Awake, alert, Not in any distress-chronically ill-appearing and frail looking. Eyes:, pupils equally reactive to light and accomodation,no scleral icterus.Pink conjunctiva HEENT: Atraumatic and Normocephalic Neck: supple, no JVD. No cervical lymphadenopathy. No thyromegaly Resp:Good air entry bilaterally CVS: S1 S2 regular,   GI: Bowel sounds present, Non tender and not distended with no gaurding, rigidity or rebound.No organomegaly Extremities: B/L Lower Ext shows no edema, both legs are warm to touch Neurology:  speech clear,Non focal, sensation is grossly intact. Musculoskeletal:No digital cyanosis Skin:No Rash, warm and dry Wounds:N/A  I have personally reviewed following labs and imaging studies  LABORATORY DATA: CBC:  Recent Labs Lab 05/15/16 0431  05/17/16 0304 05/18/16 0258 05/19/16 0239 05/20/16 0316 05/21/16 0507  WBC 11.7*  < > 17.1* 14.6* 12.5* 18.8* 17.5*  NEUTROABS 10.2*  --   --   --   --   --   --   HGB 11.1*  < > 9.0* 10.4* 10.2* 11.6* 12.5*  HCT 33.8*  < > 26.7* 30.5* 30.0* 35.1* 37.9*  MCV 95.5  < > 90.5 90.8 92.0 93.1 94.8  PLT 130*  < > 131* 149* 180 243 312  < > = values in this interval not displayed.  Basic Metabolic Panel:  Recent Labs Lab 05/17/16 0304 05/18/16 0258 05/19/16 0239 05/20/16 0316 05/21/16 0507  NA 144 143 146* 150* 151*  K 3.3* 3.7 3.5 3.8 3.8  CL 115* 116* 116* 118* 119*  CO2 18* 15* 16* 17* 19*  GLUCOSE 144* 207* 157* 103* 113*  BUN 61* 67* 75* 69* 58*  CREATININE 3.33* 3.42* 3.30* 3.20* 2.61*  CALCIUM 8.1* 8.4* 8.5* 8.6* 9.0  MG 2.3 2.3 2.2 2.3 2.5*  PHOS 2.1* 3.9 4.9* 5.4* 4.6    GFR: Estimated Creatinine Clearance: 20 mL/min (by C-G formula based on SCr of 2.61 mg/dL (H)).  Liver Function Tests:  Recent Labs Lab 05/15/16 0431 05/16/16 0204  AST 139* 72*  ALT 89* 64*  ALKPHOS 53 76  BILITOT 0.6 0.6  PROT 5.5* 5.9*  ALBUMIN 2.4* 2.2*   No results for input(s): LIPASE, AMYLASE in the last 168 hours. No results for input(s): AMMONIA in the last 168 hours.  Coagulation Profile: No results for input(s): INR, PROTIME in the last 168 hours.  Cardiac Enzymes:  Recent Labs Lab 05/15/16 1104 05/21/16 0507  CKTOTAL 1,847* 71    BNP (last 3 results) No results for input(s): PROBNP in the last 8760 hours.  HbA1C: No  results for input(s): HGBA1C in the last 72 hours.  CBG:  Recent Labs Lab 05/17/16 0005 05/17/16 0417 05/17/16 0819 05/17/16 1131 05/17/16 1619  GLUCAP 119* 143* 131* 151* 129*    Lipid Profile: No results for input(s): CHOL, HDL, LDLCALC, TRIG, CHOLHDL, LDLDIRECT in the last 72 hours.  Thyroid Function Tests: No results for input(s): TSH, T4TOTAL, FREET4, T3FREE, THYROIDAB in the last 72 hours.  Anemia Panel: No results for input(s): VITAMINB12, FOLATE, FERRITIN, TIBC, IRON, RETICCTPCT in the last 72 hours.  Urine analysis:    Component Value Date/Time   COLORURINE YELLOW 05/17/2013 0530   APPEARANCEUR CLOUDY (A) 05/17/2013 0530   LABSPEC 1.012 05/17/2013 0530   PHURINE 5.0 05/17/2013 0530   GLUCOSEU NEGATIVE 05/17/2013 0530   HGBUR NEGATIVE 05/17/2013 0530   BILIRUBINUR NEGATIVE 05/17/2013 0530   KETONESUR NEGATIVE 05/17/2013 0530  PROTEINUR 30 (A) 05/17/2013 0530   UROBILINOGEN 0.2 05/17/2013 0530   NITRITE NEGATIVE 05/17/2013 0530   LEUKOCYTESUR MODERATE (A) 05/17/2013 0530    Sepsis Labs: Lactic Acid, Venous    Component Value Date/Time   LATICACIDVEN 2.1 ([place]) 05/16/2016 1120    MICROBIOLOGY: Recent Results (from the past 240 hour(s))  MRSA PCR Screening     Status: None   Collection Time: 05/13/16 11:54 PM  Result Value Ref Range Status   MRSA by PCR NEGATIVE NEGATIVE Final    Comment:        The GeneXpert MRSA Assay (FDA approved for NASAL specimens only), is one component of a comprehensive MRSA colonization surveillance program. It is not intended to diagnose MRSA infection nor to guide or monitor treatment for MRSA infections.   Culture, blood (routine x 2)     Status: None   Collection Time: 05/15/16 10:57 AM  Result Value Ref Range Status   Specimen Description BLOOD BLOOD LEFT HAND  Final   Special Requests IN PEDIATRIC BOTTLE 3CC  Final   Culture NO GROWTH 5 DAYS  Final   Report Status 05/20/2016 FINAL  Final  Culture, blood  (routine x 2)     Status: None   Collection Time: 05/15/16 11:06 AM  Result Value Ref Range Status   Specimen Description BLOOD BLOOD RIGHT HAND  Final   Special Requests IN PEDIATRIC BOTTLE 3CC  Final   Culture NO GROWTH 5 DAYS  Final   Report Status 05/20/2016 FINAL  Final  Culture, respiratory (NON-Expectorated)     Status: None   Collection Time: 05/15/16 11:45 AM  Result Value Ref Range Status   Specimen Description TRACHEAL ASPIRATE  Final   Special Requests NONE  Final   Gram Stain   Final    NO WBC SEEN RARE GRAM POSITIVE COCCI IN PAIRS RARE YEAST    Culture Consistent with normal respiratory flora.  Final   Report Status 05/17/2016 FINAL  Final  Culture, respiratory (NON-Expectorated)     Status: None   Collection Time: 05/18/16 11:17 AM  Result Value Ref Range Status   Specimen Description TRACHEAL ASPIRATE  Final   Special Requests Normal  Final   Gram Stain   Final    RARE WBC PRESENT, PREDOMINANTLY PMN RARE BUDDING YEAST SEEN    Culture MODERATE CANDIDA ALBICANS  Final   Report Status 05/20/2016 FINAL  Final    RADIOLOGY STUDIES/RESULTS: Dg Shoulder Right  Result Date: 04/26/2016 CLINICAL DATA:  Chronic pain EXAM: RIGHT SHOULDER - 2+ VIEW COMPARISON:  Nov 01, 2014 FINDINGS: Oblique, Y scapular, and axillary images were obtained. There is no fracture or dislocation. There is moderate osteoarthritic change in the acromioclavicular and glenohumeral joints. No erosive change. Visualized right lung is clear. IMPRESSION: Moderate generalized osteoarthritic change. No fracture or dislocation. Electronically Signed   By: Lowella Grip III M.D.   On: 04/26/2016 13:30   Ct Head Wo Contrast  Result Date: 05/13/2016 CLINICAL DATA:  Syncopal episode with confusion.  Intubation. EXAM: CT HEAD WITHOUT CONTRAST TECHNIQUE: Contiguous axial images were obtained from the base of the skull through the vertex without intravenous contrast. COMPARISON:  CT head 05/15/2013.  MR head  05/17/2013. FINDINGS: Brain: No evidence for acute infarction, hemorrhage, mass lesion, hydrocephalus, or extra-axial fluid. Atrophy with chronic microvascular ischemic change. Chronic LEFT parietal and LEFT cerebellar infarcts reflect the findings on previous imaging. Vascular: Mild vascular calcification.  No hyperdense vessel. Skull: Normal. Negative for fracture or focal lesion.  Sinuses/Orbits: No acute finding. Other: None. IMPRESSION: Atrophy and small vessel disease. Remote LEFT cerebral and cerebellar infarct. No acute intracranial findings. Electronically Signed   By: Staci Righter M.D.   On: 05/13/2016 20:59   US Renal Port  Result Date: 05/14/2016 CLINICAL DATA:  Acute on chronic renal failure EXAM: RENAL / URINARY TRACT ULTRASOUND COMPLETE COMPARISON:  None. FINDINGS: Right Kidney: Length: 11.5 cm. No hydronephrosis. No calculi. Two cysts, measuring up to 1.6 cm. Increased echogenicity of the renal parenchyma. Left Kidney: Length: 11.4 cm. Lower pole cyst measuring 1.6 cm. Increased echogenicity of the renal parenchyma. Bladder: Decompressed around a Foley catheter. IMPRESSION: No hydronephrosis. There is increased echogenicity of renal parenchyma suggesting medical renal disease. Electronically Signed   By: Andreas Newport M.D.   On: 05/14/2016 06:08   Dg Chest Port 1 View  Result Date: 05/19/2016 CLINICAL DATA:  Respiratory failure. EXAM: PORTABLE CHEST 1 VIEW COMPARISON:  05/18/2016. FINDINGS: Endotracheal tube and NG tube in stable position. Heart size stable. Significant improvement in bilateral aeration with significant improvement of bilateral pulmonary infiltrates. No pleural effusion or pneumothorax. IMPRESSION: 1. Endotracheal tube and NG tube in stable position. 2. Significant improvement bilateral aeration with significant improvement bilateral pulmonary infiltrates. Electronically Signed   By: Marcello Moores  Register   On: 05/19/2016 06:56   Dg Chest Port 1 View  Result Date:  05/18/2016 CLINICAL DATA:  Acute respiratory failure.  Shortness breath. EXAM: PORTABLE CHEST 1 VIEW COMPARISON:  One-view chest x-ray 05/17/2016 FINDINGS: The endotracheal tube is stable. The NG tube courses off the inferior border of the film. Progressive left lower lobe airspace disease is present. Right middle or lower lobe airspace disease is stable. The visualized soft tissues and bony thorax are unremarkable. IMPRESSION: 1. Progressive left lower lobe pneumonia. 2. Similar appearance of right lower lobe airspace disease, likely representing pneumonia. 3. The support apparatus is stable. Electronically Signed   By: San Morelle M.D.   On: 05/18/2016 07:50   Dg Chest Port 1 View  Result Date: 05/17/2016 CLINICAL DATA:  Acute respiratory failure EXAM: PORTABLE CHEST 1 VIEW COMPARISON:  Portable chest x-ray of 05/16/2016 FINDINGS: The tip of the endotracheal tube is approximately 5.1 cm above the carina. Opacities at the lung bases may have improved slightly with minimal opacity remaining medially at the left lung base suspicious for pneumonia. NG tube extends below the hemidiaphragm. Mild cardiomegaly is stable. IMPRESSION: 1. Little change to slight improvement in basilar opacities left-greater-than-right most consistent with pneumonia. 2. Tip of endotracheal tube 5.1 cm above the carina. Electronically Signed   By: Ivar Drape M.D.   On: 05/17/2016 07:42   Dg Chest Port 1 View  Result Date: 05/16/2016 CLINICAL DATA:  Acute respiratory failure. EXAM: PORTABLE CHEST 1 VIEW COMPARISON:  05/15/2016 FINDINGS: Enteric tube terminates just below the clavicular heads, unchanged and well above the carina. Enteric tube courses into the left upper abdomen with side hole overlying the distal esophagus and tip not imaged. The cardiomediastinal silhouette is unchanged allowing for differences in patient rotation. Opacities in the left mid lung and left greater than right lung bases have not significantly  changed. No sizable pleural effusion or pneumothorax is identified. IMPRESSION: Unchanged appearance of the chest. Left greater than right lung opacities which may reflect pneumonia. Electronically Signed   By: Logan Bores M.D.   On: 05/16/2016 07:31   Dg Chest Port 1 View  Result Date: 05/15/2016 CLINICAL DATA:  Aspiration pneumonia . EXAM: PORTABLE CHEST 1 VIEW  COMPARISON:  05/13/2016 . FINDINGS: Endotracheal tube and NG tube in stable position. Cardiomegaly with normal pulmonary vascularity. Left mid lung and lower lung infiltrate noted. Mild infiltrate right lung base . No change from prior exams . Small left pleural effusion cannot be excluded. No pneumothorax . IMPRESSION: 1. Lines and tubes in stable position. 2. Unchanged left mid and lower lung infiltrate and mild right base infiltrate. Small left pleural effusion . Electronically Signed   By: Marcello Moores  Register   On: 05/15/2016 07:15   Dg Chest Portable 1 View  Result Date: 05/13/2016 CLINICAL DATA:  Endotracheal tube and nasogastric tube placement. EXAM: PORTABLE CHEST 1 VIEW COMPARISON:  05/13/2016 at 1730 hours. FINDINGS: Endotracheal tube terminates approximately 4.8 cm above the carina. Nasogastric tube is followed into the stomach. Trachea is midline. Heart size normal. Patchy airspace opacification is again seen in the left perihilar region with minimal involvement of the right lower lobe. No pleural fluid. IMPRESSION: 1. Satisfactory endotracheal tube and nasogastric tube placement. 2. Patchy bilateral airspace opacification, left greater than right, possibly due to pneumonia. Electronically Signed   By: Lorin Picket M.D.   On: 05/13/2016 18:12   Dg Chest Portable 1 View  Result Date: 05/13/2016 CLINICAL DATA:  Syncope and confusion EXAM: PORTABLE CHEST 1 VIEW COMPARISON:  None. FINDINGS: There is patchy airspace opacity throughout the left mid lower lung zone as well as in the right base. Heart size and pulmonary vascularity are  normal. No adenopathy. No bone lesions. IMPRESSION: Multifocal opacities. Suspect either multifocal pneumonia or aspiration as most likely etiologies. Heart size within normal limits. No appreciable adenopathy. Electronically Signed   By: Lowella Grip III M.D.   On: 05/13/2016 17:41   Dg Abd Portable 1v  Result Date: 05/16/2016 CLINICAL DATA:  Check nasogastric catheter placement EXAM: PORTABLE ABDOMEN - 1 VIEW COMPARISON:  None. FINDINGS: Scattered large and small bowel gas is noted. A the catheter is noted within the bladder. Nasogastric catheter is noted coiled within the stomach. Degenerative change of the lumbar spine is noted. IMPRESSION: Nasogastric catheter within the stomach. Electronically Signed   By: Inez Catalina M.D.   On: 05/16/2016 12:50     LOS: 8 days   Oren Binet, MD  [facility] Pager:[phone]  If 7PM-7AM, please contact night-coverage [email or web address] Password [facility] 05/21/2016, 10:25 AM

## 2016-05-22 DIAGNOSIS — I5189 Other ill-defined heart diseases: Secondary | ICD-10-CM | POA: Diagnosis present

## 2016-05-22 DIAGNOSIS — N35919 Unspecified urethral stricture, male, unspecified site: Secondary | ICD-10-CM | POA: Diagnosis present

## 2016-05-22 DIAGNOSIS — D72829 Elevated white blood cell count, unspecified: Secondary | ICD-10-CM | POA: Diagnosis present

## 2016-05-22 DIAGNOSIS — N183 Chronic kidney disease, stage 3 unspecified: Secondary | ICD-10-CM | POA: Diagnosis present

## 2016-05-22 DIAGNOSIS — D62 Acute posthemorrhagic anemia: Secondary | ICD-10-CM

## 2016-05-22 DIAGNOSIS — I519 Heart disease, unspecified: Secondary | ICD-10-CM

## 2016-05-22 DIAGNOSIS — G9340 Encephalopathy, unspecified: Secondary | ICD-10-CM

## 2016-05-22 DIAGNOSIS — N179 Acute kidney failure, unspecified: Secondary | ICD-10-CM

## 2016-05-22 DIAGNOSIS — F0390 Unspecified dementia without behavioral disturbance: Secondary | ICD-10-CM

## 2016-05-22 DIAGNOSIS — N400 Enlarged prostate without lower urinary tract symptoms: Secondary | ICD-10-CM

## 2016-05-22 DIAGNOSIS — E870 Hyperosmolality and hypernatremia: Secondary | ICD-10-CM

## 2016-05-22 DIAGNOSIS — I10 Essential (primary) hypertension: Secondary | ICD-10-CM

## 2016-05-22 DIAGNOSIS — R1310 Dysphagia, unspecified: Secondary | ICD-10-CM

## 2016-05-22 DIAGNOSIS — I739 Peripheral vascular disease, unspecified: Secondary | ICD-10-CM | POA: Diagnosis present

## 2016-05-22 DIAGNOSIS — I5033 Acute on chronic diastolic (congestive) heart failure: Secondary | ICD-10-CM

## 2016-05-22 DIAGNOSIS — I6930 Unspecified sequelae of cerebral infarction: Secondary | ICD-10-CM

## 2016-05-22 DIAGNOSIS — R55 Syncope and collapse: Secondary | ICD-10-CM

## 2016-05-22 DIAGNOSIS — N359 Urethral stricture, unspecified: Secondary | ICD-10-CM

## 2016-05-22 LAB — CBC
HCT: 37.3 % — ABNORMAL LOW (ref 39.0–52.0)
Hemoglobin: 12.5 g/dL — ABNORMAL LOW (ref 13.0–17.0)
MCH: 31.6 pg (ref 26.0–34.0)
MCHC: 33.5 g/dL (ref 30.0–36.0)
MCV: 94.4 fL (ref 78.0–100.0)
PLATELETS: 324 10*3/uL (ref 150–400)
RBC: 3.95 MIL/uL — AB (ref 4.22–5.81)
RDW: 15.9 % — ABNORMAL HIGH (ref 11.5–15.5)
WBC: 16.6 10*3/uL — AB (ref 4.0–10.5)

## 2016-05-22 LAB — HEPATIC FUNCTION PANEL
ALK PHOS: 135 U/L — AB (ref 38–126)
ALT: 27 U/L (ref 17–63)
AST: 19 U/L (ref 15–41)
Albumin: 2.5 g/dL — ABNORMAL LOW (ref 3.5–5.0)
BILIRUBIN DIRECT: 0.1 mg/dL (ref 0.1–0.5)
BILIRUBIN INDIRECT: 0.6 mg/dL (ref 0.3–0.9)
BILIRUBIN TOTAL: 0.7 mg/dL (ref 0.3–1.2)
Total Protein: 6.5 g/dL (ref 6.5–8.1)

## 2016-05-22 LAB — BASIC METABOLIC PANEL
Anion gap: 11 (ref 5–15)
BUN: 48 mg/dL — ABNORMAL HIGH (ref 6–20)
CALCIUM: 8.7 mg/dL — AB (ref 8.9–10.3)
CO2: 16 mmol/L — ABNORMAL LOW (ref 22–32)
CREATININE: 2.39 mg/dL — AB (ref 0.61–1.24)
Chloride: 115 mmol/L — ABNORMAL HIGH (ref 101–111)
GFR, EST AFRICAN AMERICAN: 28 mL/min — AB (ref >60)
GFR, EST NON AFRICAN AMERICAN: 24 mL/min — AB (ref >60)
Glucose, Bld: 122 mg/dL — ABNORMAL HIGH (ref 65–99)
Potassium: 3.6 mmol/L (ref 3.5–5.1)
SODIUM: 142 mmol/L (ref 135–145)

## 2016-05-22 LAB — MAGNESIUM: MAGNESIUM: 2.2 mg/dL (ref 1.7–2.4)

## 2016-05-22 LAB — PHOSPHORUS: Phosphorus: 4.4 mg/dL (ref 2.5–4.6)

## 2016-05-22 MED ORDER — ENSURE ENLIVE PO LIQD
237.0000 mL | Freq: Two times a day (BID) | ORAL | Status: DC
  Administered 2016-05-22 – 2016-05-23 (×3): 237 mL via ORAL

## 2016-05-22 MED ORDER — CYCLOBENZAPRINE HCL 5 MG PO TABS
5.0000 mg | ORAL_TABLET | Freq: Three times a day (TID) | ORAL | Status: DC | PRN
Start: 2016-05-22 — End: 2016-05-23

## 2016-05-22 NOTE — Care Management Important Message (Signed)
Important Message  Patient Details  Name: Bobby Vega MRN: [id number] Date of Birth: 09/13/1934   Medicare Important Message Given:  Yes    Nathen May 05/22/2016, 12:02 PM

## 2016-05-22 NOTE — Clinical Social Work Note (Signed)
Clinical Social Work Assessment  Patient Details  Name: Bobby Vega MRN: [id number] Date of Birth: [birth year]  Date of referral:  05/22/16               Reason for consult:  Facility Placement                Permission sought to share information with:  Chartered certified accountant granted to share information::  Yes, Verbal Permission Granted  Name::     Ferd Hibbs  Agency::  SNF  Relationship::  dtr  Contact Information:     Housing/Transportation Living arrangements for the past 2 months:  [place] of Information:  Adult Children Patient Interpreter Needed:  None Criminal Activity/Legal Involvement Pertinent to Current Situation/Hospitalization:  No - Comment as needed Significant Relationships:  Adult Children Lives with:  Self Do you feel safe going back to the place where you live?  No Need for family participation in patient care:  Yes (Comment)  Care giving concerns:  Pt lives alone with help from aids and has daughter who checks in regularly.   Social Worker assessment / plan:  CSW spoke with dtr concerning plan for pt at time of DC.  CSW explained PT recommendation for SNF and SNF referral process.  Employment status:  Retired Nurse, adult PT Recommendations:  [place], [place] / Referral to community resources:  [place]  Patient/Family's Response to care:  Dtr agreeable to pt placement for rehab- is trying to get additional care hours at home but does not think that would be sufficient given pt current impairment.  Patient/Family's Understanding of and Emotional Response to Diagnosis, Current Treatment, and Prognosis:  Dtr very involved and has good understanding of pt condition- hopeful that pt will improve and be able to return home soon.  Emotional Assessment Appearance:  Appears stated age Attitude/Demeanor/Rapport:    Affect (typically  observed):  Unable to Assess Orientation:  Oriented to Self, Oriented to Place Alcohol / Substance use:  Not Applicable Psych involvement (Current and /or in the community):  No (Comment)  Discharge Needs  Concerns to be addressed:  Care Coordination Readmission within the last 30 days:  No Current discharge risk:  Physical Impairment Barriers to Discharge:  Continued Medical Work up   Jorge Ny, LCSW 05/22/2016, 5:46 PM

## 2016-05-22 NOTE — Progress Notes (Signed)
Nutrition Follow-up  DOCUMENTATION CODES:   Not applicable  INTERVENTION:   -Ensure Enlive po BID, each supplement provides 350 kcal and 20 grams of protein -Magic Cup TID with meals -Pt may benefit from feeding assistance with meals  NUTRITION DIAGNOSIS:   Inadequate oral intake related to poor appetite as evidenced by meal completion < 25%.  Ongoing  GOAL:   Patient will meet greater than or equal to 90% of their needs  Progressing  MONITOR:   PO intake, Supplement acceptance, Diet advancement, Labs, Weight trends, Skin, I & O's  REASON FOR ASSESSMENT:   Consult Enteral/tube feeding initiation and management  ASSESSMENT:   73M with PMH of dementia, PVD, prior stroke, prior blood clots, HTN, HLD, GERD, who presents with acute hypoxemic respiratory failure thought secondary to massive aspiration of unknown etiology. Required intubation on admission.  Pt extubated on 05/19/16. Transferred from ICU to medical floor on 05/20/16.  Pt underwent BSE on 05/20/16, which revealed continued NPO. Repeat BSE on 05/21/16 advanced pt to a dysphagia 1 diet with thin liquids.   Pt sitting up in bed at time of visit. He shares "I don't know if I even had any breakfast". Lunch tray in front of pt unattempted; assisted pt with tray set up.Pt consumed a few bites of mashed potatoes at time of visit. Also noted pt consumed about 25% of an Ensure supplement on tray table, however, pt did not recall consuming. Documented meal completion 25%. RD will order supplements to optimize nutritional intake.   Labs reviewed.   Diet Order:  DIET - DYS 1 Room service appropriate? Yes; Fluid consistency: Thin  Skin:  Reviewed, no issues  Last BM:  05/21/16  Height:   Ht Readings from Last 1 Encounters:  05/14/16 5\' 5"  (1.651 m)    Weight:   Wt Readings from Last 1 Encounters:  05/22/16 156 lb (70.8 kg)    Ideal Body Weight:  61.8 kg  BMI:  Body mass index is 25.96 kg/m.  Estimated  Nutritional Needs:   Kcal:  0459-9774  Protein:  85-100 grams  Fluid:  1.7-1.9 L  EDUCATION NEEDS:   No education needs identified at this time  Rishit Burkhalter A. Jimmye Norman, RD, LDN, CDE Pager: [phone] After hours Pager: [phone]

## 2016-05-22 NOTE — Progress Notes (Addendum)
PROGRESS NOTE    Bobby Vega  XID:[id number] DOB: 1935-06-14 DOA: 05/13/2016 PCP: Philis Fendt, MD     Brief Narrative:  Patient is a 81 y.o. male with history of prior CVA and mild right-sided deficits, dementia, hypertension, dyslipidemia, history of peripheral vascular disease-status post aortofemoral bypass in 2013 chronic kidney disease stage III brought to the ED after he sustained a syncopal episode at home following which he started vomiting, he was noted to be short of breath. He was found to be hypoxic in the emergency room and was intubated. He was thought to have acute hypoxic respiratory failure from presumed aspiration pneumonia. While in the ED and in the ICU, Foley catheter placement was unsuccessful, subsequently urology was consulted, a cystoscopy was performed which showed a moderate to high-grade bulbar urethral stricture that was dilated, subsequently a Foley catheter was placed. Patient was managed in the intensive care unit, upon stability was transferred to the hospitalist service on 11/26.  Assessment & Plan:   Principal Problem:   Acute hypoxemic respiratory failure (HCC) Active Problems:   GERD (gastroesophageal reflux disease)   Aspiration pneumonia of both lungs (HCC)   Respiratory distress   Acute on chronic renal failure (HCC)   History of CVA with residual deficit   Benign essential HTN   Dementia without behavioral disturbance   PVD (peripheral vascular disease) (HCC)   Diastolic dysfunction   Urethral stricture   Benign prostatic hyperplasia without lower urinary tract symptoms   Leukocytosis   Dysphagia   Acute blood loss anemia   AKI (acute kidney injury) ([facility])   CKD (chronic kidney disease) stage 3, GFR 30-59 ml/min   Acute encephalopathy   Acute on chronic diastolic heart failure (HCC)   Hypernatremia   Syncope   Acute hypoxemic respiratory failure: Secondary to aspiration pneumonia, intubated on 11/18-extubated on 11/24. Currently  doing well on room air.   Aspiration pneumonia: Clinically improved, has completed a course of antibiotics. Afebrile but still has significant leukocytosis. Plans are to continue to monitor off antibiotics and follow clinical course. Blood/sputum cultures negative.  Acute kidney injury on chronic kidney disease stage III: Acute renal failure likely multifactorial-probably some element of obstructive uropathy (urethral stricture dilated by urology on admission) and some element of hemodynamically mediated prerenal azotemia. Creatinine slowly down trending, continue Foley catheter for now, per urology okay to attempt a voiding trial prior to discharge. Will need outpatient follow-up with urology. Monitor electrolytes. Cr slowly improving.   Acute metabolic encephalopathy: Resolved. Secondary to hypoxia and aspiration pneumonia.CT head on admission was negative for acute abnormalities.  Acute on chronic diastolic heart failure: Required several doses of intravenous furosemide while in the intensive care unit-currently euvolemic. Echo on 7/21 showed EF around 55-60%, with grade 1 diastolic dysfunction.  Hypernatremia: Likely secondary to poor oral intake and diuresis-was nothing by mouth for the past several days-as he failed a swallow evaluation. Reevaluated by speech therapy on 11/26, started on a dysphagia 1 diet. Sodium now normal.   Minimal transaminitis: Suspect secondary to rhabdomyolysis, resolving. Recheck periodically.  Rhabdomyolysis: Resolved, CK has now normalized.  Low Back Pain: complains of pain in his lower back, legs-suspect musculoskeletal etiology-no leg weakness-supportive care for now.  Syncope: Occurred prior to being brought to the emergency room-suspect vasovagal-as it occurred in the setting of vomiting. Reviewed telemetry strips-no major arrhythmias seen-except for PVCs, echocardiogram showed preserved EF. Continue telemetry monitoring.  Hypertension: Since oral  intake has been resumed-will start Coreg   History of stroke/peripheral  vascular disease: Resume antiplatelet agents, recheck LFTs-if transaminitis improving-consider restarting statin now the next few days.  GERD: Continue PPI  Urethral stricture: Seen by urology on admission-dilated under cystoscopy-subsequently Foley catheter placed. Recommendations are to proceed with a voiding trial prior to discharge. Please ensure outpatient follow-up with urology  Dysphagia: Probably secondary to generalized weakness/muscle weakness due to acute illness-no sure if he has some amount of dysphagia at baseline even prior history of dementia and CVA. Speech therapy following, started on dysphagia 1 diet-plans are to follow clinically.   Generalized deconditioning/acute on chronic debility: Probably secondary to acute illness-PT evaluation recommending CIR. Evaluation pending.   Probable history of dementia: Currently awake and alert   DVT prophylaxis: subq hep Code Status: full Family Communication: no family at bedside Disposition Plan: CIR vs SNF    Consultants:   PCCM   Urology   Procedures:  ETT 11/18 >>11/24 2PIV 11/18>> Foley 11/18>> OGT 11/18>> TTE 11/21>>EF 55-60%  Antimicrobials:  Anti-infectives    Start     Dose/Rate Route Frequency Ordered Stop   05/17/16 1800  vancomycin (VANCOCIN) IVPB 750 mg/150 ml premix  Status:  Discontinued     750 mg 150 mL/hr over 60 Minutes Intravenous Every 48 hours 05/15/16 1823 05/17/16 0910   05/17/16 1300  Ampicillin-Sulbactam (UNASYN) 3 g in sodium chloride 0.9 % 100 mL IVPB     3 g 200 mL/hr over 30 Minutes Intravenous Every 12 hours 05/17/16 0915 05/20/16 1253   05/16/16 2200  piperacillin-tazobactam (ZOSYN) IVPB 3.375 g  Status:  Discontinued     3.375 g 12.5 mL/hr over 240 Minutes Intravenous Every 8 hours 05/16/16 1530 05/16/16 1538   05/16/16 2200  piperacillin-tazobactam (ZOSYN) IVPB 2.25 g  Status:  Discontinued     2.25  g 100 mL/hr over 30 Minutes Intravenous Every 8 hours 05/16/16 1538 05/17/16 0910   05/15/16 1100  azithromycin (ZITHROMAX) 200 MG/5ML suspension 500 mg     500 mg Per Tube Daily 05/15/16 1031 05/17/16 0944   05/14/16 1800  vancomycin (VANCOCIN) IVPB 750 mg/150 ml premix  Status:  Discontinued     750 mg 150 mL/hr over 60 Minutes Intravenous Every 24 hours 05/13/16 1849 05/15/16 1823   05/14/16 0600  piperacillin-tazobactam (ZOSYN) IVPB 2.25 g  Status:  Discontinued     2.25 g 100 mL/hr over 30 Minutes Intravenous Every 8 hours 05/13/16 1849 05/16/16 1530   05/13/16 1930  vancomycin (VANCOCIN) 1,500 mg in sodium chloride 0.9 % 500 mL IVPB     1,500 mg 250 mL/hr over 120 Minutes Intravenous  Once 05/13/16 1849 05/13/16 2127   05/13/16 1900  piperacillin-tazobactam (ZOSYN) IVPB 3.375 g     3.375 g 100 mL/hr over 30 Minutes Intravenous  Once 05/13/16 1849 05/13/16 2026      Subjective: States he is "terrible" but cannot further tell me any specific symptoms. States breathing is better and denies any chest pain, cough, nausea, vomiting, or pain elsewhere.   Objective: Vitals:   05/21/16 2039 05/22/16 0147 05/22/16 0453 05/22/16 0500  BP: (!) 172/84 (!) 182/74 (!) 191/81   Pulse: 79 81 81   Resp:      Temp: 98.1 F (36.7 C)  98.1 F (36.7 C)   TempSrc: Oral  Oral   SpO2: 94%  96%   Weight:    70.8 kg (156 lb)  Height:        Intake/Output Summary (Last 24 hours) at 05/22/16 0748 Last data filed at 05/22/16 [phone]  Gross per 24 hour  Intake          1352.83 ml  Output             1300 ml  Net            52.83 ml   Filed Weights   05/20/16 0500 05/21/16 0532 05/22/16 0500  Weight: 75.8 kg (167 lb 1.7 oz) 72.4 kg (159 lb 9.6 oz) 70.8 kg (156 lb)    Examination:  General exam: Appears calm and comfortable Respiratory system: Clear to auscultation. Respiratory effort normal. Cardiovascular system: S1 & S2 heard, RRR. No JVD, murmurs, rubs, gallops or clicks. No pedal  edema. Gastrointestinal system: Abdomen is nondistended, soft and nontender. No organomegaly or masses felt. Normal bowel sounds heard. Central nervous system: Alert and oriented, although mildly confused. No focal neurological deficits. Extremities: Symmetric 5 x 5 power. Skin: No rashes, lesions or ulcers Psychiatry: Judgement and insight appear normal. Mood & affect appropriate.   Data Reviewed: I have personally reviewed following labs and imaging studies  CBC:  Recent Labs Lab 05/18/16 0258 05/19/16 0239 05/20/16 0316 05/21/16 0507 05/22/16 0610  WBC 14.6* 12.5* 18.8* 17.5* 16.6*  HGB 10.4* 10.2* 11.6* 12.5* 12.5*  HCT 30.5* 30.0* 35.1* 37.9* 37.3*  MCV 90.8 92.0 93.1 94.8 94.4  PLT 149* 180 243 312 341   Basic Metabolic Panel:  Recent Labs Lab 05/18/16 0258 05/19/16 0239 05/20/16 0316 05/21/16 0507 05/22/16 0610  NA 143 146* 150* 151* 142  K 3.7 3.5 3.8 3.8 3.6  CL 116* 116* 118* 119* 115*  CO2 15* 16* 17* 19* 16*  GLUCOSE 207* 157* 103* 113* 122*  BUN 67* 75* 69* 58* 48*  CREATININE 3.42* 3.30* 3.20* 2.61* 2.39*  CALCIUM 8.4* 8.5* 8.6* 9.0 8.7*  MG 2.3 2.2 2.3 2.5* 2.2  PHOS 3.9 4.9* 5.4* 4.6 4.4   GFR: Estimated Creatinine Clearance: 21.8 mL/min (by C-G formula based on SCr of 2.39 mg/dL (H)). Liver Function Tests:  Recent Labs Lab 05/16/16 0204  AST 72*  ALT 64*  ALKPHOS 76  BILITOT 0.6  PROT 5.9*  ALBUMIN 2.2*   No results for input(s): LIPASE, AMYLASE in the last 168 hours. No results for input(s): AMMONIA in the last 168 hours. Coagulation Profile: No results for input(s): INR, PROTIME in the last 168 hours. Cardiac Enzymes:  Recent Labs Lab 05/15/16 1104 05/21/16 0507  CKTOTAL 1,847* 71   BNP (last 3 results) No results for input(s): PROBNP in the last 8760 hours. HbA1C: No results for input(s): HGBA1C in the last 72 hours. CBG:  Recent Labs Lab 05/17/16 0005 05/17/16 0417 05/17/16 0819 05/17/16 1131 05/17/16 1619  GLUCAP  119* 143* 131* 151* 129*   Lipid Profile: No results for input(s): CHOL, HDL, LDLCALC, TRIG, CHOLHDL, LDLDIRECT in the last 72 hours. Thyroid Function Tests: No results for input(s): TSH, T4TOTAL, FREET4, T3FREE, THYROIDAB in the last 72 hours. Anemia Panel: No results for input(s): VITAMINB12, FOLATE, FERRITIN, TIBC, IRON, RETICCTPCT in the last 72 hours. Sepsis Labs:  Recent Labs Lab 05/15/16 0934 05/15/16 1104 05/16/16 0204 05/16/16 0850 05/16/16 1120 05/17/16 0304  PROCALCITON  --  >175.00 >175.00  --   --  >175.00  LATICACIDVEN 2.3*  --   --  2.3* 2.1*  --     Recent Results (from the past 240 hour(s))  MRSA PCR Screening     Status: None   Collection Time: 05/13/16 11:54 PM  Result Value Ref Range Status   MRSA  by PCR NEGATIVE NEGATIVE Final    Comment:        The GeneXpert MRSA Assay (FDA approved for NASAL specimens only), is one component of a comprehensive MRSA colonization surveillance program. It is not intended to diagnose MRSA infection nor to guide or monitor treatment for MRSA infections.   Culture, blood (routine x 2)     Status: None   Collection Time: 05/15/16 10:57 AM  Result Value Ref Range Status   Specimen Description BLOOD BLOOD LEFT HAND  Final   Special Requests IN PEDIATRIC BOTTLE 3CC  Final   Culture NO GROWTH 5 DAYS  Final   Report Status 05/20/2016 FINAL  Final  Culture, blood (routine x 2)     Status: None   Collection Time: 05/15/16 11:06 AM  Result Value Ref Range Status   Specimen Description BLOOD BLOOD RIGHT HAND  Final   Special Requests IN PEDIATRIC BOTTLE 3CC  Final   Culture NO GROWTH 5 DAYS  Final   Report Status 05/20/2016 FINAL  Final  Culture, respiratory (NON-Expectorated)     Status: None   Collection Time: 05/15/16 11:45 AM  Result Value Ref Range Status   Specimen Description TRACHEAL ASPIRATE  Final   Special Requests NONE  Final   Gram Stain   Final    NO WBC SEEN RARE GRAM POSITIVE COCCI IN PAIRS RARE  YEAST    Culture Consistent with normal respiratory flora.  Final   Report Status 05/17/2016 FINAL  Final  Culture, respiratory (NON-Expectorated)     Status: None   Collection Time: 05/18/16 11:17 AM  Result Value Ref Range Status   Specimen Description TRACHEAL ASPIRATE  Final   Special Requests Normal  Final   Gram Stain   Final    RARE WBC PRESENT, PREDOMINANTLY PMN RARE BUDDING YEAST SEEN    Culture MODERATE CANDIDA ALBICANS  Final   Report Status 05/20/2016 FINAL  Final       Radiology Studies: No results found.    Scheduled Meds: . acetaminophen  1,000 mg Oral Q8H  . carvedilol  6.25 mg Oral BID WC  . clopidogrel  75 mg Oral Q breakfast  . heparin subcutaneous  5,000 Units Subcutaneous Q8H  . mouth rinse  15 mL Mouth Rinse BID  . pantoprazole  40 mg Oral Q1200   Continuous Infusions: . dextrose 40 mL/hr at 05/22/16 0513  . sodium chloride 0.45 % 1,000 mL infusion 10 mL/hr at 05/19/16 0815     LOS: 9 days    Time spent: 40 minutes   Dessa Phi, DO [facility] [email or web address] Password [facility] 05/22/2016, 7:48 AM

## 2016-05-22 NOTE — Consult Note (Signed)
Physical Medicine and Rehabilitation Consult Reason for Consult: Acute hypoxic respiratory failure/multi-medical Referring Physician: [facility]  HPI: Bobby Vega is a 81 y.o. right handed male with history of prior CVA with mild right-sided deficits, dementia, hypertension, PVD maintained on Plavix status post aortofemoral bypass 2013, chronic renal insufficiency with baseline creatinine 2.83-3.13. Per chart review patient lives alone and independent prior to admission. Present 05/14/2016 with respiratory distress question syncope and oxygen saturations in the 80s and required intubation. Daughter had provided additional history of vomiting episodes. Cranial CT scan without acute changes. Renal ultrasound showed no hydronephrosis. Chest x-ray with multifocal opacities suspect pneumonia versus aspiration. Echocardiogram with ejection fraction of 11% grade 1 diastolic dysfunction. Placed on broad-spectrum antibiotics. Blood and sputum cultures currently negative. Hospital course inability to place foley catheter on admission with urology services consulted noting urethral stricture identified with history of BPH and Foley tube was placed and advised to continue to monitor output. Patient was extubated 05/19/2016. Subcutaneous heparin added for DVT prophylaxis. Currently on a dysphagia 1 with thin liquid diet. Physical therapy evaluation completed with recommendations of physical medicine rehabilitation consult.   Review of Systems  Constitutional: Negative for chills and fever.  Eyes: Negative for blurred vision and double vision.  Respiratory: Positive for cough and shortness of breath.   Cardiovascular: Positive for leg swelling. Negative for chest pain and palpitations.  Gastrointestinal: Positive for constipation and vomiting. Negative for nausea.  Genitourinary: Positive for frequency and urgency.  Musculoskeletal: Positive for joint pain and myalgias.  Skin: Negative for rash.    Neurological: Positive for dizziness and weakness. Negative for seizures.  All other systems reviewed and are negative.  Past Medical History:  Diagnosis Date  . Arthritis    hip  . Borderline diabetes   . BPH (benign prostatic hyperplasia)    takes Avodart daily  . Bronchitis    yrs ago  . Cervical spondylosis with myelopathy   . Cervical spondylosis without myelopathy   . Chronic back pain   . Constipation    Metamucil prn  . Diverticulosis   . ED (erectile dysfunction)    takes Cialis as needed as well as Viagra  . Embolism - blood clot    in stomach  . GERD (gastroesophageal reflux disease)    takes Nexium daily  . Hyperlipidemia    takes Zocor nightly  . Hypertension    takes Losartan daily  . Lumbar spondylosis with myelopathy   . Lumbar spondylosis with myelopathy   . Nocturia   . Pain in joint, hand   . Peripheral vascular disease ([place])   . Stroke ([place])   . Urinary urgency    Past Surgical History:  Procedure Laterality Date  . ABDOMINAL AORTAGRAM N/A 07/04/2011   Procedure: ABDOMINAL Maxcine Ham;  Surgeon: Serafina Mitchell, MD;  Location: [facility] CATH LAB;  Service: Cardiovascular;  Laterality: N/A;  . AORTA - BILATERAL FEMORAL ARTERY BYPASS GRAFT  07/13/2011   Procedure: AORTA BIFEMORAL BYPASS GRAFT;  Surgeon: Theotis Burrow, MD;  Location: [facility];  Service: Vascular;  Laterality: Bilateral;  . CYSTOSCOPY  07/13/2011   Procedure: CYSTOSCOPY;  Surgeon: Hanley Ben, MD;  Location: [facility];  Service: Urology;  Laterality: N/A;  cystoscopy,dilatation & insertion of councill foley catheter  . LOWER EXTREMITY ANGIOGRAM Bilateral 07/04/2011   Procedure: LOWER EXTREMITY ANGIOGRAM;  Surgeon: Serafina Mitchell, MD;  Location: [facility] CATH LAB;  Service: Cardiovascular;  Laterality: Bilateral;  . SKIN GRAFT  1983   Family  History  Problem Relation Age of Onset  . Heart disease Mother     Before age 40  . Heart disease Brother     NOT  before age 19  . Heart attack Brother   .  Anesthesia problems Neg Hx   . Hypotension Neg Hx   . Malignant hyperthermia Neg Hx   . Pseudochol deficiency Neg Hx    Social History:  reports that he has been smoking Cigars.  He has smoked for the past 0.50 years. He has never used smokeless tobacco. He reports that he does not drink alcohol or use drugs. Allergies: No Known Allergies Medications Prior to Admission  Medication Sig Dispense Refill  . clopidogrel (PLAVIX) 75 MG tablet Take 1 tablet (75 mg total) by mouth daily with breakfast. 30 tablet 4  . esomeprazole (NEXIUM) 40 MG capsule Take 40 mg by mouth daily before breakfast.     . gabapentin (NEURONTIN) 300 MG capsule Take 300 mg by mouth 3 (three) times daily.    . hydrALAZINE (APRESOLINE) 25 MG tablet Take 1 tablet (25 mg total) by mouth 2 (two) times daily. 60 tablet 3  . Vitamin D, Ergocalciferol, (DRISDOL) 50000 UNITS CAPS capsule Take 50,000 Units by mouth every 7 (seven) days. Take on Friday or Saturday    . [DISCONTINUED] aspirin 81 MG EC tablet Take 1 tablet (81 mg total) by mouth daily. For 3 months with plavix. (Patient not taking: Reported on 05/13/2016) 30 tablet 3  . [DISCONTINUED] atorvastatin (LIPITOR) 20 MG tablet Take 1 tablet (20 mg total) by mouth daily. (Patient not taking: Reported on 05/13/2016) 30 tablet 4  . [DISCONTINUED] ciprofloxacin (CIPRO) 500 MG tablet Take 1 tablet (500 mg total) by mouth 2 (two) times daily. X 6days (Patient not taking: Reported on 05/13/2016) 12 tablet 0    Home: [place] expects to be discharged to:: Private residence Living Arrangements: Alone Available Help at Discharge: Family (unsure how available family is) Type of Home: House Home Access: Stairs to enter Technical brewer of Steps: 3 Home Layout: One level Home Equipment: None  Functional History: Prior Function Level of Independence: Independent Functional Status:  Mobility: Bed Mobility Overal bed mobility: Needs Assistance Bed  Mobility: Rolling, Supine to Sit, Sit to Supine Rolling: Modified independent (Device/Increase time) (used railing) Supine to sit: Mod assist Sit to supine: Min assist General bed mobility comments: assist with shoulders and legs Transfers Overall transfer level: Needs assistance Equipment used: Rolling walker (2 wheeled) Transfers: Sit to/from Stand Sit to Stand: Min assist General transfer comment: assist for safety Ambulation/Gait General Gait Details: unable to ambuate - patient reports he is in too much pain and returns to bed.    ADL:    Cognition: Cognition Overall Cognitive Status: No family/caregiver present to determine baseline cognitive functioning Orientation Level: Disoriented to time Cognition Arousal/Alertness: Awake/alert Behavior During Therapy: Restless Overall Cognitive Status: No family/caregiver present to determine baseline cognitive functioning Area of Impairment: Orientation Orientation Level: Time, Situation  Blood pressure (!) 191/81, pulse 81, temperature 98.1 F (36.7 C), temperature source Oral, resp. rate 20, height 5\' 5"  (1.651 m), weight 70.8 kg (156 lb), SpO2 96 %. Physical Exam  Vitals reviewed. Constitutional: He appears well-developed.  Frail  HENT:  Head: Normocephalic and atraumatic.  Eyes: Conjunctivae and EOM are normal.  Neck: Normal range of motion. Neck supple. No thyromegaly present.  Cardiovascular: Normal rate and regular rhythm.   Respiratory: Effort normal and breath sounds normal. No respiratory distress.  GI: Soft. Bowel sounds are normal. He exhibits no distension.  Musculoskeletal: He exhibits no edema or tenderness.  Neurological: He is alert.  A&Ox2 Followed simple commands. Motor: RUE: Shoulder abduction 3-/5, elbow flex/ext 4-/5, hand grip 4-/5 LUE: 4+/5 proximal to distal B/l LE: HF 3-/5, KE 3/5, ADF/PF 4+/5  Skin: Skin is warm and dry.  Psychiatric: His affect is blunt. His speech is delayed. He is slowed.  Cognition and memory are impaired.    No results found for this or any previous visit (from the past 24 hour(s)). No results found.  Assessment/Plan: Diagnosis:  Acute hypoxic respiratory failure/multi-medical Labs and images independently reviewed.  Records reviewed and summated above.  1. Does the need for close, 24 hr/day medical supervision in concert with the patient's rehab needs make it unreasonable for this patient to be served in a less intensive setting? Potentially  2. Co-Morbidities requiring supervision/potential complications: prior CVA with mild right-sided deficits, dementia, HTN (currently requiring IV meds, monitor and provide prns in accordance with increased physical exertion and pain), PVD (cont meds), chronic renal insufficiency (avoid nephrotoxic meds), grade 1 diastolic dysfunction (monitor weight and signs/symptoms of fluid overload), urethral stricture, BPH (meds, d/c foley per Urology), dysphagia (advance as tolerated), tobacco abuse (counsel), leukocytosis (cont to monitor for signs and symptoms of infection, further workup if indicated) ABLA (transfuse if necessary to ensure appropriate perfusion for increased activity tolerance) 3. Due to bladder management, bowel management, safety, disease management, medication administration and patient education, does the patient require 24 hr/day rehab nursing? Yes 4. Does the patient require coordinated care of a physician, rehab nurse, PT (1-2 hrs/day, 5 days/week), OT (1-2 hrs/day, 5 days/week) and SLP (1-2 hrs/day, 5 days/week) to address physical and functional deficits in the context of the above medical diagnosis(es)? Yes Addressing deficits in the following areas: balance, endurance, locomotion, strength, transferring, bathing, dressing, toileting, cognition, swallowing and psychosocial support 5. Can the patient actively participate in an intensive therapy program of at least 3 hrs of therapy per day at least 5 days per  week? Yes 6. The potential for patient to make measurable gains while on inpatient rehab is good 7. Anticipated functional outcomes upon discharge from inpatient rehab are supervision and min assist  with PT, supervision and min assist with OT, min assist with SLP. 8. Estimated rehab length of stay to reach the above functional goals is: 15-18 days. 9. Does the patient have adequate social supports and living environment to accommodate these discharge functional goals? Potentially 10. Anticipated D/C setting: Home 11. Anticipated post D/C treatments: HH therapy and Home excercise program 12. Overall Rehab/Functional Prognosis: good  RECOMMENDATIONS: This patient's condition is appropriate for continued rehabilitative care in the following setting: Will need to inquire about pt's baseline level of functioning, including cognition.  Pt does not appear to have support at discharge and as such may require SNF if at baseline and family unable to care for patient.   Patient has agreed to participate in recommended program. Potentially Note that insurance prior authorization may be required for reimbursement for recommended care.  Comment: Rehab Admissions Coordinator to follow up.  Delice Lesch, MD, Mellody Drown 05/22/2016

## 2016-05-22 NOTE — NC FL2 (Signed)
[place] MEDICAID FL2 LEVEL OF CARE SCREENING TOOL     IDENTIFICATION  Patient Name: Bobby Vega Birthdate: 01/11/1935 Sex: male Admission Date (Current Location): 05/13/2016  [facility] and [place] Number:  Herbalist and Address:  [facility] [place] [address] [place], [place] [address]      Provider Number: [id number]  Attending Physician Name and Address:  Shon Millet*  Relative Name and Phone Number:       Current Level of Care: Hospital Recommended Level of Care: [place] Prior Approval Number:    Date Approved/Denied:   PASRR Number: [id number]  Discharge Plan: SNF    Current Diagnoses: Patient Active Problem List   Diagnosis Date Noted  . History of CVA with residual deficit   . Benign essential HTN   . Dementia without behavioral disturbance   . PVD (peripheral vascular disease) ([place])   . Diastolic dysfunction   . Urethral stricture   . Benign prostatic hyperplasia without lower urinary tract symptoms   . Leukocytosis   . Dysphagia   . Acute blood loss anemia   . Respiratory distress   . Acute on chronic renal failure ([place])   . Aspiration pneumonia of both lungs ([place]) 05/13/2016  . Acute hypoxemic respiratory failure ([place]) 05/13/2016  . Pain in limb 06/10/2014  . Numbness-Bilateral Shoulder-Hand 06/10/2014  . Hand weakness 06/04/2013  . Left arm weakness 06/04/2013  . CVA (cerebral vascular accident) ([place]) 05/17/2013  . Other and unspecified hyperlipidemia 05/16/2013  . GERD (gastroesophageal reflux disease)   . Hypertension   . Right arm weakness 05/15/2013  . Open wound of abdominal wall, lateral, without mention of complication 38/03/1750  . PVD (peripheral vascular disease) with claudication ([place]) 08/02/2011  . Atherosclerosis of native arteries of the extremities with intermittent claudication 07/10/2011  . Occlusion and stenosis of carotid artery without mention of cerebral  infarction 07/10/2011  . Peripheral vascular disease, unspecified 06/26/2011    Orientation RESPIRATION BLADDER Height & Weight     Self, Place  Normal Incontinent, Indwelling catheter Weight: 156 lb (70.8 kg) Height:  5\' 5"  (165.1 cm)  BEHAVIORAL SYMPTOMS/MOOD NEUROLOGICAL BOWEL NUTRITION STATUS      Incontinent (rectal tube) Diet  AMBULATORY STATUS COMMUNICATION OF NEEDS Skin   Extensive Assist Verbally Normal                       Personal Care Assistance Level of Assistance  Bathing, Dressing Bathing Assistance: Limited assistance   Dressing Assistance: Limited assistance     Functional Limitations Info             SPECIAL CARE FACTORS FREQUENCY  PT (By licensed PT), OT (By licensed OT)     PT Frequency: 5/wk OT Frequency: 5/wk            Contractures      Additional Factors Info  Code Status, Allergies Code Status Info: FULL Allergies Info: NKA           Current Medications (05/22/2016):  This is the current hospital active medication list Current Facility-Administered Medications  Medication Dose Route Frequency Provider Last Rate Last Dose  . acetaminophen (TYLENOL) tablet 1,000 mg  1,000 mg Oral Q8H Shanker Kristeen Mans, MD   1,000 mg at 05/22/16 1302  . carvedilol (COREG) tablet 6.25 mg  6.25 mg Oral BID WC Jonetta Osgood, MD   6.25 mg at 05/22/16 0841  . clopidogrel (PLAVIX) tablet 75 mg  75 mg Oral Q breakfast Jonetta Osgood, MD   75 mg at 05/22/16 0840  . cyclobenzaprine (FLEXERIL) tablet 5 mg  5 mg Oral TID PRN Shon Millet, DO      . heparin injection 5,000 Units  5,000 Units Subcutaneous Q8H Raylene Miyamoto, MD   5,000 Units at 05/22/16 1301  . hydrALAZINE (APRESOLINE) injection 10 mg  10 mg Intravenous Q2H PRN Jose Shirl Harris, MD   10 mg at 05/21/16 2102  . labetalol (NORMODYNE,TRANDATE) injection 10 mg  10 mg Intravenous Q2H PRN Colbert Coyer, MD   10 mg at 05/22/16 0512  . MEDLINE mouth rinse  15 mL  Mouth Rinse BID Raylene Miyamoto, MD   15 mL at 05/21/16 2103  . oxyCODONE (Oxy IR/ROXICODONE) immediate release tablet 5 mg  5 mg Oral Q4H PRN Jonetta Osgood, MD   5 mg at 05/22/16 0512  . pantoprazole (PROTONIX) EC tablet 40 mg  40 mg Oral Q1200 Jonetta Osgood, MD   40 mg at 05/22/16 1302     Discharge Medications: Please see discharge summary for a list of discharge medications.  Relevant Imaging Results:  Relevant Lab Results:   Additional Information SS#: [id number]  Jorge Ny, LCSW

## 2016-05-23 LAB — BASIC METABOLIC PANEL
ANION GAP: 10 (ref 5–15)
BUN: 41 mg/dL — AB (ref 6–20)
CALCIUM: 8.7 mg/dL — AB (ref 8.9–10.3)
CO2: 17 mmol/L — ABNORMAL LOW (ref 22–32)
Chloride: 113 mmol/L — ABNORMAL HIGH (ref 101–111)
Creatinine, Ser: 2.51 mg/dL — ABNORMAL HIGH (ref 0.61–1.24)
GFR calc Af Amer: 26 mL/min — ABNORMAL LOW (ref >60)
GFR, EST NON AFRICAN AMERICAN: 23 mL/min — AB (ref >60)
Glucose, Bld: 81 mg/dL (ref 65–99)
POTASSIUM: 3.5 mmol/L (ref 3.5–5.1)
SODIUM: 140 mmol/L (ref 135–145)

## 2016-05-23 LAB — CBC
HEMATOCRIT: 35.5 % — AB (ref 39.0–52.0)
HEMOGLOBIN: 11.8 g/dL — AB (ref 13.0–17.0)
MCH: 31.4 pg (ref 26.0–34.0)
MCHC: 33.2 g/dL (ref 30.0–36.0)
MCV: 94.4 fL (ref 78.0–100.0)
Platelets: 332 10*3/uL (ref 150–400)
RBC: 3.76 MIL/uL — ABNORMAL LOW (ref 4.22–5.81)
RDW: 15.7 % — AB (ref 11.5–15.5)
WBC: 15.3 10*3/uL — AB (ref 4.0–10.5)

## 2016-05-23 MED ORDER — CARVEDILOL 6.25 MG PO TABS: 6.2500 mg | ORAL_TABLET | Freq: Two times a day (BID) | ORAL | 0 refills | Status: DC

## 2016-05-23 MED ORDER — AMLODIPINE BESYLATE 5 MG PO TABS
5.0000 mg | ORAL_TABLET | Freq: Every day | ORAL | Status: DC
  Administered 2016-05-23: 5 mg via ORAL
  Filled 2016-05-23: qty 1

## 2016-05-23 MED ORDER — AMLODIPINE BESYLATE 5 MG PO TABS: 5.0000 mg | ORAL_TABLET | Freq: Every day | ORAL | 0 refills | Status: DC

## 2016-05-23 NOTE — Care Management Note (Signed)
Case Management Note  Patient Details  Name: Bobby Vega MRN: [id number] Date of Birth: [birth year]  Subjective/Objective:                 Patient admitted after aspirating during syncopal episode at home. Admitted to ICU, intubated for B asp. PNA. Patient lives at home alone, plan to DC to SNF.    Action/Plan:  DC to SNF today as facilitated by CSW.  Expected Discharge Date:                  Expected Discharge Plan:  Skilled Nursing Facility  In-House Referral:  Clinical Social Work  Discharge planning Services  CM Consult  Post Acute Care Choice:  NA Choice offered to:  NA  DME Arranged:    DME Agency:     HH Arranged:    [place] Agency:     Status of Service:  Completed, signed off  If discussed at [employer], dates discussed:    Additional Comments:  Carles Collet, RN 05/23/2016, 11:35 AM

## 2016-05-23 NOTE — Progress Notes (Signed)
Patient will discharge to [facility] Anticipated discharge date: 11/28 Family notified: [facility] by Corey Harold- scheduled for 2:15pm  CSW signing off.  Jorge Ny, LCSW Clinical Social Worker [phone]

## 2016-05-23 NOTE — Discharge Summary (Signed)
Physician Discharge Summary  Bobby Vega IFO:[id number] DOB: July 28, 1934 DOA: 05/13/2016  PCP: Philis Fendt, MD  Admit date: 05/13/2016 Discharge date: 05/23/2016  Admitted From: Home Disposition:  SNF  Recommendations for Outpatient Follow-up:  1. Follow up with PCP in 1 week 2. Follow up with [facility] in 1 week for urethral stricture 3. Please obtain BMP/CBC in 1 week for resolution of leukocytosis and AKI   Home Health: No  Equipment/Devices: None   Discharge Condition: Stable CODE STATUS: Full  Diet recommendation: Dysphagia 1   Brief/Interim Summary: Patient is a 81 y.o.malewith history of prior CVA and mild right-sided deficits, dementia,hypertension, dyslipidemia, history of peripheral vascular disease-status post aortofemoral bypass in 2013 chronic kidney disease stage III brought to the ED after he sustained a syncopal episode at home following which hestarted vomiting, hewas noted to be short of breath. He was found to be hypoxic in the emergency room and was intubated. He was thought to have acute hypoxic respiratory failure from presumed aspiration pneumonia. While in the ED and in the ICU, Foley catheter placement was unsuccessful, subsequently urology was consulted, a cystoscopy was performed which showed a moderate to high-grade bulbar urethral stricture that was dilated, subsequently a Foley catheter was placed. Patient was managed in the intensive care unit, upon stability was transferred to the hospitalist service on 11/26. He was evaluated by speech language pathology and was placed on dysphagia diet. He completed antibiotics for aspiration pneumonia. Foley catheter was removed and patient was able to void on his own prior to discharge. He does need an outpatient follow-up with urology.  Discharge Diagnoses:  Principal Problem:   Acute hypoxemic respiratory failure (HCC) Active Problems:   GERD (gastroesophageal reflux disease)   Aspiration pneumonia  of both lungs (HCC)   Respiratory distress   Acute on chronic renal failure (HCC)   History of CVA with residual deficit   Benign essential HTN   Dementia without behavioral disturbance   PVD (peripheral vascular disease) (HCC)   Diastolic dysfunction   Urethral stricture   Benign prostatic hyperplasia without lower urinary tract symptoms   Leukocytosis   Dysphagia   Acute blood loss anemia   AKI (acute kidney injury) ([place])   CKD (chronic kidney disease) stage 3, GFR 30-59 ml/min   Acute encephalopathy   Acute on chronic diastolic heart failure (HCC)   Hypernatremia   Syncope  Acute hypoxemic respiratory failure: Secondary to aspiration pneumonia, intubated on 11/18-extubated on 11/24. Currently doing well on room air.   Aspiration pneumonia:Clinically improved, has completed a course of antibiotics. Afebrile but still has significant leukocytosis. Plans are to continue to monitor off antibiotics and follow clinical course. Blood/sputum cultures negative.  Acute kidney injury on chronic kidney disease stage MVE:HMCNO renal failure likely multifactorial-probably some element of obstructive uropathy (urethral stricture dilated by urology on admission) and some element of hemodynamically mediated prerenal azotemia. Creatinine slowly down trending. Monitor electrolytes. Cr slowly improving.    Acute metabolic encephalopathy:Resolved. Secondary to hypoxia and aspiration pneumonia. CT head on admission was negative for acute abnormalities.  Acute on chronic diastolic heart failure:Required several doses of intravenous furosemide while in the intensive care unit-currently euvolemic. Echo on 7/21 showed EF around 55-60%, with grade 1 diastolic dysfunction.  Hypernatremia:Likely secondary to poor oral intake and diuresis-was nothing by mouth for the past several days-as he failed a swallow evaluation. Reevaluated by speech therapy on 11/26, started on a dysphagia 1 diet. Sodium now  normal.   Minimal transaminitis:Suspect secondary to  rhabdomyolysis, resolving. LFT now normal.   Rhabdomyolysis:Resolved, CK has now normalized.  Low Back Pain: complains of pain in his lower back, legs-suspect musculoskeletal etiology-no leg weakness-supportive care for now.  Syncope:Occurred prior to being brought to the emergency room-suspect vasovagal-as it occurred in the setting of vomiting. Reviewed telemetry strips-no major arrhythmias seen-except for PVCs, echocardiogram showed preserved EF. Continue telemetry monitoring.  Hypertension: Started on Coreg, Norvasc  History of stroke/peripheral vascular disease: Resume plavix.   Urethral stricture: Seen by urology on admission-dilated under cystoscopy-subsequently Foley catheter placed. Foley has since been removed. Patient has been able to void on his own prior to discharge. He needs to follow up with urology as outpatient.   Dysphagia:Probably secondary to generalized weakness/muscle weakness due to acute illness-no sure if he has some amount of dysphagia at baseline even prior history of dementia and CVA. Speech therapy following, started on dysphagia 1 diet  Generalized deconditioning/acute on chronic debility: Probably secondary to acute illness. Will discharge to SNF for continued therapy.   Probable history of dementia: Currently awake and alert  Discharge Instructions  Discharge Instructions    Increase activity slowly    Complete by:  As directed        Medication List    TAKE these medications   amLODipine 5 MG tablet Commonly known as:  NORVASC Take 1 tablet (5 mg total) by mouth daily. Start taking on:  05/24/2016   carvedilol 6.25 MG tablet Commonly known as:  COREG Take 1 tablet (6.25 mg total) by mouth 2 (two) times daily with a meal.   clopidogrel 75 MG tablet Commonly known as:  PLAVIX Take 1 tablet (75 mg total) by mouth daily with breakfast.   esomeprazole 40 MG capsule Commonly  known as:  NEXIUM Take 40 mg by mouth daily before breakfast.   gabapentin 300 MG capsule Commonly known as:  NEURONTIN Take 300 mg by mouth 3 (three) times daily.   hydrALAZINE 25 MG tablet Commonly known as:  APRESOLINE Take 1 tablet (25 mg total) by mouth 2 (two) times daily.   Vitamin D (Ergocalciferol) 50000 units Caps capsule Commonly known as:  DRISDOL Take 50,000 Units by mouth every 7 (seven) days. Take on Friday or Saturday      Follow-up Information    Claybon Jabs, MD. Schedule an appointment as soon as possible for a visit in 1 week(s).   Specialty:  Urology Contact information: [place] [address] [phone]        Philis Fendt, MD. Schedule an appointment as soon as possible for a visit in 1 week(s).   Specialty:  Internal Medicine Contact information: [place] [address] [phone]          No Known Allergies  Consultations:  PCCM  Urology  Phys Med    Procedures/Studies: Dg Shoulder Right  Result Date: 04/26/2016 CLINICAL DATA:  Chronic pain EXAM: RIGHT SHOULDER - 2+ VIEW COMPARISON:  Nov 01, 2014 FINDINGS: Oblique, Y scapular, and axillary images were obtained. There is no fracture or dislocation. There is moderate osteoarthritic change in the acromioclavicular and glenohumeral joints. No erosive change. Visualized right lung is clear. IMPRESSION: Moderate generalized osteoarthritic change. No fracture or dislocation. Electronically Signed   By: Lowella Grip III M.D.   On: 04/26/2016 13:30   Ct Head Wo Contrast  Result Date: 05/13/2016 CLINICAL DATA:  Syncopal episode with confusion.  Intubation. EXAM: CT HEAD WITHOUT CONTRAST TECHNIQUE: Contiguous axial images were obtained from the  base of the skull through the vertex without intravenous contrast. COMPARISON:  CT head 05/15/2013.  MR head 05/17/2013. FINDINGS: Brain: No evidence for acute infarction, hemorrhage, mass lesion, hydrocephalus,  or extra-axial fluid. Atrophy with chronic microvascular ischemic change. Chronic LEFT parietal and LEFT cerebellar infarcts reflect the findings on previous imaging. Vascular: Mild vascular calcification.  No hyperdense vessel. Skull: Normal. Negative for fracture or focal lesion. Sinuses/Orbits: No acute finding. Other: None. IMPRESSION: Atrophy and small vessel disease. Remote LEFT cerebral and cerebellar infarct. No acute intracranial findings. Electronically Signed   By: Staci Righter M.D.   On: 05/13/2016 20:59   US Renal Port  Result Date: 05/14/2016 CLINICAL DATA:  Acute on chronic renal failure EXAM: RENAL / URINARY TRACT ULTRASOUND COMPLETE COMPARISON:  None. FINDINGS: Right Kidney: Length: 11.5 cm. No hydronephrosis. No calculi. Two cysts, measuring up to 1.6 cm. Increased echogenicity of the renal parenchyma. Left Kidney: Length: 11.4 cm. Lower pole cyst measuring 1.6 cm. Increased echogenicity of the renal parenchyma. Bladder: Decompressed around a Foley catheter. IMPRESSION: No hydronephrosis. There is increased echogenicity of renal parenchyma suggesting medical renal disease. Electronically Signed   By: Andreas Newport M.D.   On: 05/14/2016 06:08   Dg Chest Port 1 View  Result Date: 05/19/2016 CLINICAL DATA:  Respiratory failure. EXAM: PORTABLE CHEST 1 VIEW COMPARISON:  05/18/2016. FINDINGS: Endotracheal tube and NG tube in stable position. Heart size stable. Significant improvement in bilateral aeration with significant improvement of bilateral pulmonary infiltrates. No pleural effusion or pneumothorax. IMPRESSION: 1. Endotracheal tube and NG tube in stable position. 2. Significant improvement bilateral aeration with significant improvement bilateral pulmonary infiltrates. Electronically Signed   By: Marcello Moores  Register   On: 05/19/2016 06:56   Dg Chest Port 1 View  Result Date: 05/18/2016 CLINICAL DATA:  Acute respiratory failure.  Shortness breath. EXAM: PORTABLE CHEST 1 VIEW  COMPARISON:  One-view chest x-ray 05/17/2016 FINDINGS: The endotracheal tube is stable. The NG tube courses off the inferior border of the film. Progressive left lower lobe airspace disease is present. Right middle or lower lobe airspace disease is stable. The visualized soft tissues and bony thorax are unremarkable. IMPRESSION: 1. Progressive left lower lobe pneumonia. 2. Similar appearance of right lower lobe airspace disease, likely representing pneumonia. 3. The support apparatus is stable. Electronically Signed   By: San Morelle M.D.   On: 05/18/2016 07:50   Dg Chest Port 1 View  Result Date: 05/17/2016 CLINICAL DATA:  Acute respiratory failure EXAM: PORTABLE CHEST 1 VIEW COMPARISON:  Portable chest x-ray of 05/16/2016 FINDINGS: The tip of the endotracheal tube is approximately 5.1 cm above the carina. Opacities at the lung bases may have improved slightly with minimal opacity remaining medially at the left lung base suspicious for pneumonia. NG tube extends below the hemidiaphragm. Mild cardiomegaly is stable. IMPRESSION: 1. Little change to slight improvement in basilar opacities left-greater-than-right most consistent with pneumonia. 2. Tip of endotracheal tube 5.1 cm above the carina. Electronically Signed   By: Ivar Drape M.D.   On: 05/17/2016 07:42   Dg Chest Port 1 View  Result Date: 05/16/2016 CLINICAL DATA:  Acute respiratory failure. EXAM: PORTABLE CHEST 1 VIEW COMPARISON:  05/15/2016 FINDINGS: Enteric tube terminates just below the clavicular heads, unchanged and well above the carina. Enteric tube courses into the left upper abdomen with side hole overlying the distal esophagus and tip not imaged. The cardiomediastinal silhouette is unchanged allowing for differences in patient rotation. Opacities in the left mid lung and left greater  than right lung bases have not significantly changed. No sizable pleural effusion or pneumothorax is identified. IMPRESSION: Unchanged appearance  of the chest. Left greater than right lung opacities which may reflect pneumonia. Electronically Signed   By: Logan Bores M.D.   On: 05/16/2016 07:31   Dg Chest Port 1 View  Result Date: 05/15/2016 CLINICAL DATA:  Aspiration pneumonia . EXAM: PORTABLE CHEST 1 VIEW COMPARISON:  05/13/2016 . FINDINGS: Endotracheal tube and NG tube in stable position. Cardiomegaly with normal pulmonary vascularity. Left mid lung and lower lung infiltrate noted. Mild infiltrate right lung base . No change from prior exams . Small left pleural effusion cannot be excluded. No pneumothorax . IMPRESSION: 1. Lines and tubes in stable position. 2. Unchanged left mid and lower lung infiltrate and mild right base infiltrate. Small left pleural effusion . Electronically Signed   By: Marcello Moores  Register   On: 05/15/2016 07:15   Dg Chest Portable 1 View  Result Date: 05/13/2016 CLINICAL DATA:  Endotracheal tube and nasogastric tube placement. EXAM: PORTABLE CHEST 1 VIEW COMPARISON:  05/13/2016 at 1730 hours. FINDINGS: Endotracheal tube terminates approximately 4.8 cm above the carina. Nasogastric tube is followed into the stomach. Trachea is midline. Heart size normal. Patchy airspace opacification is again seen in the left perihilar region with minimal involvement of the right lower lobe. No pleural fluid. IMPRESSION: 1. Satisfactory endotracheal tube and nasogastric tube placement. 2. Patchy bilateral airspace opacification, left greater than right, possibly due to pneumonia. Electronically Signed   By: Lorin Picket M.D.   On: 05/13/2016 18:12   Dg Chest Portable 1 View  Result Date: 05/13/2016 CLINICAL DATA:  Syncope and confusion EXAM: PORTABLE CHEST 1 VIEW COMPARISON:  None. FINDINGS: There is patchy airspace opacity throughout the left mid lower lung zone as well as in the right base. Heart size and pulmonary vascularity are normal. No adenopathy. No bone lesions. IMPRESSION: Multifocal opacities. Suspect either multifocal  pneumonia or aspiration as most likely etiologies. Heart size within normal limits. No appreciable adenopathy. Electronically Signed   By: Lowella Grip III M.D.   On: 05/13/2016 17:41   Dg Abd Portable 1v  Result Date: 05/16/2016 CLINICAL DATA:  Check nasogastric catheter placement EXAM: PORTABLE ABDOMEN - 1 VIEW COMPARISON:  None. FINDINGS: Scattered large and small bowel gas is noted. A the catheter is noted within the bladder. Nasogastric catheter is noted coiled within the stomach. Degenerative change of the lumbar spine is noted. IMPRESSION: Nasogastric catheter within the stomach. Electronically Signed   By: Inez Catalina M.D.   On: 05/16/2016 12:50      Discharge Exam: Vitals:   05/23/16 0508 05/23/16 0951  BP: (!) 168/82 (!) 170/86  Pulse: 80 86  Resp: 18   Temp: 97.9 F (36.6 C)    Vitals:   05/23/16 0500 05/23/16 0508 05/23/16 0700 05/23/16 0951  BP:  (!) 168/82  (!) 170/86  Pulse:  80  86  Resp:  18    Temp:  97.9 F (36.6 C)    TempSrc:  Oral    SpO2:  97% 100%   Weight: 71.7 kg (158 lb)     Height:       General exam: Appears calm and comfortable Respiratory system: Crackles left base, on room air, no respiratory distress  Cardiovascular system: S1 & S2 heard, RRR. No JVD, murmurs, rubs, gallops or clicks. No pedal edema. Gastrointestinal system: Abdomen is nondistended, soft and nontender. No organomegaly or masses felt. Normal bowel sounds heard. Central  nervous system: Alert and oriented, although mildly confused. No focal neurological deficits. Extremities: Symmetric 5 x 5 power. Skin: No rashes, lesions or ulcers   The results of significant diagnostics from this hospitalization (including imaging, microbiology, ancillary and laboratory) are listed below for reference.     Microbiology: Recent Results (from the past 240 hour(s))  MRSA PCR Screening     Status: None   Collection Time: 05/13/16 11:54 PM  Result Value Ref Range Status   MRSA by PCR  NEGATIVE NEGATIVE Final    Comment:        The GeneXpert MRSA Assay (FDA approved for NASAL specimens only), is one component of a comprehensive MRSA colonization surveillance program. It is not intended to diagnose MRSA infection nor to guide or monitor treatment for MRSA infections.   Culture, blood (routine x 2)     Status: None   Collection Time: 05/15/16 10:57 AM  Result Value Ref Range Status   Specimen Description BLOOD BLOOD LEFT HAND  Final   Special Requests IN PEDIATRIC BOTTLE 3CC  Final   Culture NO GROWTH 5 DAYS  Final   Report Status 05/20/2016 FINAL  Final  Culture, blood (routine x 2)     Status: None   Collection Time: 05/15/16 11:06 AM  Result Value Ref Range Status   Specimen Description BLOOD BLOOD RIGHT HAND  Final   Special Requests IN PEDIATRIC BOTTLE 3CC  Final   Culture NO GROWTH 5 DAYS  Final   Report Status 05/20/2016 FINAL  Final  Culture, respiratory (NON-Expectorated)     Status: None   Collection Time: 05/15/16 11:45 AM  Result Value Ref Range Status   Specimen Description TRACHEAL ASPIRATE  Final   Special Requests NONE  Final   Gram Stain   Final    NO WBC SEEN RARE GRAM POSITIVE COCCI IN PAIRS RARE YEAST    Culture Consistent with normal respiratory flora.  Final   Report Status 05/17/2016 FINAL  Final  Culture, respiratory (NON-Expectorated)     Status: None   Collection Time: 05/18/16 11:17 AM  Result Value Ref Range Status   Specimen Description TRACHEAL ASPIRATE  Final   Special Requests Normal  Final   Gram Stain   Final    RARE WBC PRESENT, PREDOMINANTLY PMN RARE BUDDING YEAST SEEN    Culture MODERATE CANDIDA ALBICANS  Final   Report Status 05/20/2016 FINAL  Final     Labs: BNP (last 3 results) No results for input(s): BNP in the last 8760 hours. Basic Metabolic Panel:  Recent Labs Lab 05/18/16 0258 05/19/16 0239 05/20/16 0316 05/21/16 0507 05/22/16 0610 05/23/16 0507  NA 143 146* 150* 151* 142 140  K 3.7 3.5 3.8  3.8 3.6 3.5  CL 116* 116* 118* 119* 115* 113*  CO2 15* 16* 17* 19* 16* 17*  GLUCOSE 207* 157* 103* 113* 122* 81  BUN 67* 75* 69* 58* 48* 41*  CREATININE 3.42* 3.30* 3.20* 2.61* 2.39* 2.51*  CALCIUM 8.4* 8.5* 8.6* 9.0 8.7* 8.7*  MG 2.3 2.2 2.3 2.5* 2.2  --   PHOS 3.9 4.9* 5.4* 4.6 4.4  --    Liver Function Tests:  Recent Labs Lab 05/22/16 0610  AST 19  ALT 27  ALKPHOS 135*  BILITOT 0.7  PROT 6.5  ALBUMIN 2.5*   No results for input(s): LIPASE, AMYLASE in the last 168 hours. No results for input(s): AMMONIA in the last 168 hours. CBC:  Recent Labs Lab 05/19/16 0239 05/20/16 0316 05/21/16 0507  05/22/16 0610 05/23/16 0507  WBC 12.5* 18.8* 17.5* 16.6* 15.3*  HGB 10.2* 11.6* 12.5* 12.5* 11.8*  HCT 30.0* 35.1* 37.9* 37.3* 35.5*  MCV 92.0 93.1 94.8 94.4 94.4  PLT 180 243 312 324 332   Cardiac Enzymes:  Recent Labs Lab 05/21/16 0507  CKTOTAL 71   BNP: Invalid input(s): POCBNP CBG:  Recent Labs Lab 05/17/16 0005 05/17/16 0417 05/17/16 0819 05/17/16 1131 05/17/16 1619  GLUCAP 119* 143* 131* 151* 129*   D-Dimer No results for input(s): DDIMER in the last 72 hours. Hgb A1c No results for input(s): HGBA1C in the last 72 hours. Lipid Profile No results for input(s): CHOL, HDL, LDLCALC, TRIG, CHOLHDL, LDLDIRECT in the last 72 hours. Thyroid function studies No results for input(s): TSH, T4TOTAL, T3FREE, THYROIDAB in the last 72 hours.  Invalid input(s): FREET3 Anemia work up No results for input(s): VITAMINB12, FOLATE, FERRITIN, TIBC, IRON, RETICCTPCT in the last 72 hours. Urinalysis    Component Value Date/Time   COLORURINE YELLOW 05/17/2013 0530   APPEARANCEUR CLOUDY (A) 05/17/2013 0530   LABSPEC 1.012 05/17/2013 0530   PHURINE 5.0 05/17/2013 0530   GLUCOSEU NEGATIVE 05/17/2013 0530   HGBUR NEGATIVE 05/17/2013 0530   BILIRUBINUR NEGATIVE 05/17/2013 0530   KETONESUR NEGATIVE 05/17/2013 0530   PROTEINUR 30 (A) 05/17/2013 0530   UROBILINOGEN 0.2  05/17/2013 0530   NITRITE NEGATIVE 05/17/2013 0530   LEUKOCYTESUR MODERATE (A) 05/17/2013 0530   Sepsis Labs Invalid input(s): PROCALCITONIN,  WBC,  LACTICIDVEN Microbiology Recent Results (from the past 240 hour(s))  MRSA PCR Screening     Status: None   Collection Time: 05/13/16 11:54 PM  Result Value Ref Range Status   MRSA by PCR NEGATIVE NEGATIVE Final    Comment:        The GeneXpert MRSA Assay (FDA approved for NASAL specimens only), is one component of a comprehensive MRSA colonization surveillance program. It is not intended to diagnose MRSA infection nor to guide or monitor treatment for MRSA infections.   Culture, blood (routine x 2)     Status: None   Collection Time: 05/15/16 10:57 AM  Result Value Ref Range Status   Specimen Description BLOOD BLOOD LEFT HAND  Final   Special Requests IN PEDIATRIC BOTTLE 3CC  Final   Culture NO GROWTH 5 DAYS  Final   Report Status 05/20/2016 FINAL  Final  Culture, blood (routine x 2)     Status: None   Collection Time: 05/15/16 11:06 AM  Result Value Ref Range Status   Specimen Description BLOOD BLOOD RIGHT HAND  Final   Special Requests IN PEDIATRIC BOTTLE 3CC  Final   Culture NO GROWTH 5 DAYS  Final   Report Status 05/20/2016 FINAL  Final  Culture, respiratory (NON-Expectorated)     Status: None   Collection Time: 05/15/16 11:45 AM  Result Value Ref Range Status   Specimen Description TRACHEAL ASPIRATE  Final   Special Requests NONE  Final   Gram Stain   Final    NO WBC SEEN RARE GRAM POSITIVE COCCI IN PAIRS RARE YEAST    Culture Consistent with normal respiratory flora.  Final   Report Status 05/17/2016 FINAL  Final  Culture, respiratory (NON-Expectorated)     Status: None   Collection Time: 05/18/16 11:17 AM  Result Value Ref Range Status   Specimen Description TRACHEAL ASPIRATE  Final   Special Requests Normal  Final   Gram Stain   Final    RARE WBC PRESENT, PREDOMINANTLY PMN RARE BUDDING YEAST  SEEN     Culture MODERATE CANDIDA ALBICANS  Final   Report Status 05/20/2016 FINAL  Final     Time coordinating discharge: Over 30 minutes  SIGNED:  Dessa Phi, DO [facility] Pager [phone]  If 7PM-7AM, please contact night-coverage [email or web address] Password [facility] 05/23/2016, 11:58 AM

## 2016-05-23 NOTE — Progress Notes (Signed)
Rehab admissions - Noted SW planning SNF for today and daughter is in agreement.  Rehab MD recommending SNF as well.  I will sign off for inpatient rehab at this time.  Call me for questions.  #[phone]

## 2016-05-23 NOTE — Progress Notes (Signed)
CSW spoke with patient dtr and provided SNF offers- dtr would like [facility]  CSW called facility who is starting [facility] for today  Per MD plan is for DC to SNF today  Jorge Ny, [place] Social Worker [phone]

## 2016-05-23 NOTE — Clinical Social Work Placement (Signed)
   CLINICAL SOCIAL WORK PLACEMENT  NOTE  Date:  05/23/2016  Patient Details  Name: Bobby Vega MRN: [id number] Date of Birth: Jan 04, 1935  Clinical Social Work is seeking post-discharge placement for this patient at the [place] level of care (*CSW will initial, date and re-position this form in  chart as items are completed):  Yes   Patient/family provided with [facility] Work Department's list of facilities offering this level of care within the geographic area requested by the patient (or if unable, by the patient's family).  Yes   Patient/family informed of their freedom to choose among providers that offer the needed level of care, that participate in Medicare, Medicaid or managed care program needed by the patient, have an available bed and are willing to accept the patient.  Yes   Patient/family informed of [facility]'s ownership interest in [facility] and [facility], as well as of the fact that they are under no obligation to receive care at these facilities.  PASRR submitted to EDS on 05/22/16     PASRR number received on 05/22/16     Existing PASRR number confirmed on       FL2 transmitted to all facilities in geographic area requested by pt/family on 05/22/16     FL2 transmitted to all facilities within larger geographic area on       Patient informed that his/her managed care company has contracts with or will negotiate with certain facilities, including the following:            Patient/family informed of bed offers received.  Patient chooses bed at [facility]     Physician recommends and patient chooses bed at      Patient to be transferred to [facility] on 05/23/16.  Patient to be transferred to facility by ptar     Patient family notified on 05/23/16 of transfer.  Name of family member notified:  Ferd Hibbs     PHYSICIAN Please sign FL2     Additional Comment:     _______________________________________________ Jorge Ny, LCSW 05/23/2016, 1:32 PM

## 2016-05-23 NOTE — Progress Notes (Signed)
Speech Language Pathology Treatment: Dysphagia  Patient Details Name: Angus Amini MRN: [id number] DOB: 22-Oct-1934 Today's Date: 05/23/2016 Time: 2426-8341 SLP Time Calculation (min) (ACUTE ONLY): 14 min  Assessment / Plan / Recommendation Clinical Impression  Skilled treatment session focused on addressing dysphagia goals.  SLP set-up Dys.1 textures and thin liquids which patient self-feed with Min verbal cues for portion control and pacing, which effectively prevented overt s/s of aspiration.  Advanced, Dys.2 textures were also trailed, but resulted in prolonged oral mastication/manipulation and clearance due to not having dentures.  Recommend to continue advanced textures trials with dentures present.      HPI HPI: Mr. Thornhill is a 46M with history of dementia, GERD, HLD, HTN, PVD, prior stroke, prior blood clots, and BPH with urgency, who presents via EMS following syncopal episode. En route patient was confused and had increasing shortness of breath. On arrival to ED patient placed on NRB with sats in the 80s; decision made to intubate. Family (daughter) provides additional history that he had some friends over and was vomiting a lot. He got very pale and they called EMS. His oxygen levels were reportedly a little low, but the patient refused to go with them to the hospital. After EMS left, he had another episode of vomiting and EMS was again called and this time was successful in bringing him in for evaluation.  CXR showed bilateral opacities concerning for aspiration. Intubated from 11/18 to 11/24.       SLP Plan  Continue with current plan of care     Recommendations  Diet recommendations: Dysphagia 1 (puree);Thin liquid Liquids provided via: Cup;Straw Medication Administration: Whole meds with puree Supervision: Patient able to self feed;Full supervision/cueing for compensatory strategies Compensations: Slow rate;Small sips/bites Postural Changes and/or Swallow Maneuvers: Seated  upright 90 degrees                Oral Care Recommendations: Oral care BID Follow up Recommendations: Skilled Nursing facility;24 hour supervision/assistance Plan: Continue with current plan of care       GO              Carmelia Roller., CCC-SLP [phone]  [place] 05/23/2016, 1:05 PM

## 2016-05-28 ENCOUNTER — Emergency Department (HOSPITAL_COMMUNITY)
Admission: EM | Admit: 2016-05-28 | Discharge: 2016-05-28 | Disposition: A | Discharge status: Home / Self Care | Payer: Medicare HMO | Attending: Emergency Medicine | Admitting: Emergency Medicine

## 2016-05-28 ENCOUNTER — Emergency Department (HOSPITAL_COMMUNITY): Payer: Medicare HMO

## 2016-05-28 ENCOUNTER — Encounter (HOSPITAL_COMMUNITY): Payer: Self-pay

## 2016-05-28 DIAGNOSIS — W0110XA Fall on same level from slipping, tripping and stumbling with subsequent striking against unspecified object, initial encounter: Secondary | ICD-10-CM | POA: Diagnosis not present

## 2016-05-28 DIAGNOSIS — W19XXXA Unspecified fall, initial encounter: Secondary | ICD-10-CM

## 2016-05-28 DIAGNOSIS — Z8673 Personal history of transient ischemic attack (TIA), and cerebral infarction without residual deficits: Secondary | ICD-10-CM | POA: Diagnosis not present

## 2016-05-28 DIAGNOSIS — F17290 Nicotine dependence, other tobacco product, uncomplicated: Secondary | ICD-10-CM | POA: Diagnosis not present

## 2016-05-28 DIAGNOSIS — Y9389 Activity, other specified: Secondary | ICD-10-CM | POA: Insufficient documentation

## 2016-05-28 DIAGNOSIS — I130 Hypertensive heart and chronic kidney disease with heart failure and stage 1 through stage 4 chronic kidney disease, or unspecified chronic kidney disease: Secondary | ICD-10-CM | POA: Diagnosis not present

## 2016-05-28 DIAGNOSIS — N183 Chronic kidney disease, stage 3 (moderate): Secondary | ICD-10-CM | POA: Insufficient documentation

## 2016-05-28 DIAGNOSIS — I5033 Acute on chronic diastolic (congestive) heart failure: Secondary | ICD-10-CM | POA: Insufficient documentation

## 2016-05-28 DIAGNOSIS — S0083XA Contusion of other part of head, initial encounter: Secondary | ICD-10-CM | POA: Insufficient documentation

## 2016-05-28 DIAGNOSIS — Y92009 Unspecified place in unspecified non-institutional (private) residence as the place of occurrence of the external cause: Secondary | ICD-10-CM | POA: Insufficient documentation

## 2016-05-28 DIAGNOSIS — S0990XA Unspecified injury of head, initial encounter: Secondary | ICD-10-CM

## 2016-05-28 DIAGNOSIS — Y999 Unspecified external cause status: Secondary | ICD-10-CM | POA: Insufficient documentation

## 2016-05-28 NOTE — Discharge Instructions (Signed)
Ice to area of swelling on head for pain relief.  Follow up with your primary care provider for discussion of today's diagnosis.  Return to ER for change in mental status, slurred speech, new or worsening symptoms, any additional concerns.

## 2016-05-28 NOTE — ED Notes (Signed)
Pt given [place] sandwich and juice.  [facility] called for transport back to facility

## 2016-05-28 NOTE — ED Triage Notes (Addendum)
Pt presents via EMS from [facility] after an unwitnessed fall that occurred earlier today. Pt reports he was getting out of bed to use the restroom when he slipped and hit his head on the floor. Pt denies LOC or dizziness before fall. Per EMS, pt has frontal hematoma with swelling and reports head pain. Per EMS, pt is A&O x 4. Pt on Plavix. Hx of HTN. BP 136/84, 81 Hr, CBG 135.

## 2016-05-28 NOTE — ED Notes (Signed)
EDP at bedside  

## 2016-05-28 NOTE — ED Provider Notes (Signed)
[facility] DEPT Provider Note   CSN: [id number] Arrival date & time: 05/28/16  1725     History   Chief Complaint Chief Complaint  Patient presents with  . Fall  . Head Injury    HPI Bobby Vega is a 81 y.o. male.  The history is provided by the patient, a relative and medical records. No language interpreter was used.  Fall  Associated symptoms include headaches. Pertinent negatives include no chest pain, no abdominal pain and no shortness of breath.  Head Injury   Pertinent negatives include no vomiting and no weakness.   Bobby Vega is a 81 y.o. male  with a PMH of prior stroke on Plavix who presents to the Emergency Department For evaluation after a mechanical fall. Patient states that he slipped on the slick bathroom floor and face planted, hitting his forehead on the ground. He denies loss of consciousness or symptoms prior to fall. Patient is complaining of headache at the area of the forehead where he hit his head, but denies any other symptoms. He denies neck pain, chest pain, back pain, abdominal pain in the upper or lower extremity pain, numbness or muscle weakness. Family at bedside state that he has been acting himself and they have not noticed any slurred speech or gait abnormalities.  Past Medical History:  Diagnosis Date  . Arthritis    hip  . Borderline diabetes   . BPH (benign prostatic hyperplasia)    takes Avodart daily  . Bronchitis    yrs ago  . Cervical spondylosis with myelopathy   . Cervical spondylosis without myelopathy   . Chronic back pain   . Constipation    Metamucil prn  . Diverticulosis   . ED (erectile dysfunction)    takes Cialis as needed as well as Viagra  . Embolism - blood clot    in stomach  . GERD (gastroesophageal reflux disease)    takes Nexium daily  . Hyperlipidemia    takes Zocor nightly  . Hypertension    takes Losartan daily  . Lumbar spondylosis with myelopathy   . Lumbar spondylosis with myelopathy   .  Nocturia   . Pain in joint, hand   . Peripheral vascular disease ([place])   . Stroke ([place])   . Urinary urgency     Patient Active Problem List   Diagnosis Date Noted  . AKI (acute kidney injury) ([place]) 05/22/2016  . CKD (chronic kidney disease) stage 3, GFR 30-59 ml/min 05/22/2016  . Acute encephalopathy 05/22/2016  . Acute on chronic diastolic heart failure ([place]) 05/22/2016  . Hypernatremia 05/22/2016  . Syncope 05/22/2016  . History of CVA with residual deficit   . Benign essential HTN   . Dementia without behavioral disturbance   . PVD (peripheral vascular disease) ([place])   . Diastolic dysfunction   . Urethral stricture   . Benign prostatic hyperplasia without lower urinary tract symptoms   . Leukocytosis   . Dysphagia   . Acute blood loss anemia   . Respiratory distress   . Acute on chronic renal failure ([place])   . Aspiration pneumonia of both lungs ([place]) 05/13/2016  . Acute hypoxemic respiratory failure ([place]) 05/13/2016  . Pain in limb 06/10/2014  . Numbness-Bilateral Shoulder-Hand 06/10/2014  . Hand weakness 06/04/2013  . Left arm weakness 06/04/2013  . CVA (cerebral vascular accident) ([place]) 05/17/2013  . Other and unspecified hyperlipidemia 05/16/2013  . GERD (gastroesophageal reflux disease)   . Hypertension   . Right arm weakness 05/15/2013  .  Open wound of abdominal wall, lateral, without mention of complication 65/08/5463  . PVD (peripheral vascular disease) with claudication ([place]) 08/02/2011  . Atherosclerosis of native arteries of the extremities with intermittent claudication 07/10/2011  . Occlusion and stenosis of carotid artery without mention of cerebral infarction 07/10/2011  . Peripheral vascular disease, unspecified 06/26/2011    Past Surgical History:  Procedure Laterality Date  . ABDOMINAL AORTAGRAM N/A 07/04/2011   Procedure: ABDOMINAL Maxcine Ham;  Surgeon: Serafina Mitchell, MD;  Location: [facility] CATH LAB;  Service: Cardiovascular;  Laterality: N/A;  . AORTA  - BILATERAL FEMORAL ARTERY BYPASS GRAFT  07/13/2011   Procedure: AORTA BIFEMORAL BYPASS GRAFT;  Surgeon: Theotis Burrow, MD;  Location: [facility];  Service: Vascular;  Laterality: Bilateral;  . CYSTOSCOPY  07/13/2011   Procedure: CYSTOSCOPY;  Surgeon: Hanley Ben, MD;  Location: [facility];  Service: Urology;  Laterality: N/A;  cystoscopy,dilatation & insertion of councill foley catheter  . LOWER EXTREMITY ANGIOGRAM Bilateral 07/04/2011   Procedure: LOWER EXTREMITY ANGIOGRAM;  Surgeon: Serafina Mitchell, MD;  Location: [facility] CATH LAB;  Service: Cardiovascular;  Laterality: Bilateral;  . SKIN GRAFT  1983       Home Medications    Prior to Admission medications   Medication Sig Start Date End Date Taking? Authorizing Provider  amLODipine (NORVASC) 5 MG tablet Take 1 tablet (5 mg total) by mouth daily. 05/24/16   Jennifer Chahn-Yang Choi, DO  carvedilol (COREG) 6.25 MG tablet Take 1 tablet (6.25 mg total) by mouth 2 (two) times daily with a meal. 05/23/16   Shon Millet, DO  clopidogrel (PLAVIX) 75 MG tablet Take 1 tablet (75 mg total) by mouth daily with breakfast. 05/18/13   [place] Krystal Eaton, MD  esomeprazole (NEXIUM) 40 MG capsule Take 40 mg by mouth daily before breakfast.     Historical Provider, MD  gabapentin (NEURONTIN) 300 MG capsule Take 300 mg by mouth 3 (three) times daily.    Historical Provider, MD  hydrALAZINE (APRESOLINE) 25 MG tablet Take 1 tablet (25 mg total) by mouth 2 (two) times daily. 05/18/13   [place]ude[place] Krystal Eaton, MD  Vitamin D, Ergocalciferol, (DRISDOL) 50000 UNITS CAPS capsule Take 50,000 Units by mouth every 7 (seven) days. Take on Friday or Saturday    Historical Provider, MD    Family History Family History  Problem Relation Age of Onset  . Heart disease Mother     Before age 15  . Heart disease Brother     NOT  before age 8  . Heart attack Brother   . Anesthesia problems Neg Hx   . Hypotension Neg Hx   . Malignant hyperthermia Neg Hx   . Pseudochol deficiency  Neg Hx     Social History Social History  Substance Use Topics  . Smoking status: Current Some Day Smoker    Years: 0.50    Types: Cigars    Last attempt to quit: 04/29/1992  . Smokeless tobacco: Never Used  . Alcohol use No     Allergies   Patient has no known allergies.   Review of Systems Review of Systems  Constitutional: Negative for activity change and fever.  HENT: Negative for congestion.   Eyes: Negative for visual disturbance.  Respiratory: Negative for cough and shortness of breath.   Cardiovascular: Negative for chest pain.  Gastrointestinal: Negative for abdominal pain, nausea and vomiting.  Genitourinary: Negative for dysuria.  Musculoskeletal: Negative for back pain and neck pain.  Skin: Positive for wound (Hematoma).  Neurological:  Positive for headaches. Negative for dizziness, syncope and weakness.     Physical Exam Updated Vital Signs BP 122/68   Pulse 76   Temp 98.6 F (37 C) (Oral)   Resp 17   Ht 5\' 5"  (1.651 m)   Wt 71.7 kg   SpO2 95%   BMI 26.29 kg/m   Physical Exam  Constitutional: He is oriented to person, place, and time. He appears well-developed and well-nourished. No distress.  HENT:  Head: Normocephalic. Head is without raccoon's eyes and without Battle's sign.    Right Ear: No hemotympanum.  Left Ear: No hemotympanum.  Neck:  Full range of motion without pain. No midline or paraspinal tenderness.  Cardiovascular: Normal rate, regular rhythm and normal heart sounds.   No murmur heard. Pulmonary/Chest: Effort normal and breath sounds normal. No respiratory distress.  Abdominal: Soft. He exhibits no distension. There is no tenderness.  Musculoskeletal:  Full range of motion of upper and lower extremities without tenderness. No T or L-spine or paraspinal tenderness. Pelvis stable.   Neurological: He is alert and oriented to person, place, and time.  Alert, oriented, thought content appropriate, able to give a coherent history.  Speech is clear and goal oriented, able to follow commands.  Cranial Nerves:  II:  Peripheral visual fields grossly normal, pupils equal, round, reactive to light III, IV, VI: EOM intact bilaterally, ptosis not present V,VII: smile symmetric, eyes kept closed tightly against resistance, facial light touch sensation equal VIII: hearing grossly normal IX, X: symmetric soft palate movement, uvula elevates symmetrically  XI: bilateral shoulder shrug symmetric and strong XII: midline tongue extension 5/5 muscle strength in upper and lower extremities bilaterally including strong and equal grip strength and dorsiflexion/plantar flexion. Sensory to light touch normal in all four extremities.   Skin: Skin is warm and dry.  Nursing note and vitals reviewed.    ED Treatments / Results  Labs (all labs ordered are listed, but only abnormal results are displayed) Labs Reviewed - No data to display  EKG  EKG Interpretation None       Radiology Ct Head Wo Contrast  Result Date: 05/28/2016 CLINICAL DATA:  [address] and hit head on floor today. Headache and left forehead hematoma. Initial encounter. EXAM: CT HEAD WITHOUT CONTRAST TECHNIQUE: Contiguous axial images were obtained from the base of the skull through the vertex without intravenous contrast. COMPARISON:  05/13/2016 FINDINGS: Brain: No evidence of acute infarction, hemorrhage, hydrocephalus, extra-axial collection or mass lesion/mass effect. Old high left parietal lobe and left cerebellar infarcts are stable in appearance. Mild cerebral atrophy and chronic small vessel disease are also unchanged. Vascular: No hyperdense vessel or unexpected calcification. Skull: No evidence of skull fracture. Mild left frontal scalp hematoma noted. Sinuses/Orbits: No acute finding. Other: None. IMPRESSION: Mild left frontal scalp hematoma. No evidence of skull fracture or acute intracranial abnormality. Old left parietal and cerebellar infarcts. Mild cerebral  atrophy and chronic small vessel disease. Electronically Signed   By: Earle Gell M.D.   On: 05/28/2016 19:13    Procedures Procedures (including critical care time)  Medications Ordered in ED Medications - No data to display   Initial Impression / Assessment and Plan / ED Course  I have reviewed the triage vital signs and the nursing notes.  Pertinent labs & imaging results that were available during my care of the patient were reviewed by me and considered in my medical decision making (see chart for details).  Clinical Course    Bobby Vega is  a 81 y.o. male who presents to ED for Evaluation following mechanical fall. Patient is on Plavix. On exam, he exhibits no focal neurological deficits. He does have a hematoma to the left forehead. No open wounds appreciated. CT head was obtained which shows no acute injury. Evaluation does not show pathology that would require ongoing emergent intervention or inpatient treatment. Patient is hemodynamically stable and mentating appropriately. Stable for discharge.   Patient seen by and discussed with Dr. Laverta Baltimore who agrees with treatment plan.   Final Clinical Impressions(s) / ED Diagnoses   Final diagnoses:  Injury of head, initial encounter  Fall, initial encounter    New Prescriptions New Prescriptions   No medications on file     [facility] Larence Thone, PA-C 05/28/16 1932    Margette Fast, MD 05/29/16 [phone]

## 2016-05-28 NOTE — ED Notes (Signed)
Pt returned from CT °

## 2016-05-28 NOTE — ED Notes (Addendum)
Attempted to call [employer] x 2.  No answer at facility

## 2016-05-28 NOTE — ED Notes (Signed)
Pt transported to CT ?

## 2017-03-14 ENCOUNTER — Other Ambulatory Visit: Payer: Self-pay | Admitting: Nephrology

## 2017-03-14 DIAGNOSIS — N179 Acute kidney failure, unspecified: Secondary | ICD-10-CM

## 2017-03-14 DIAGNOSIS — N184 Chronic kidney disease, stage 4 (severe): Secondary | ICD-10-CM

## 2017-03-20 ENCOUNTER — Other Ambulatory Visit: Payer: Medicare HMO

## 2017-03-22 ENCOUNTER — Ambulatory Visit
Admission: RE | Admit: 2017-03-22 | Discharge: 2017-03-22 | Disposition: A | Discharge status: Home / Self Care | Payer: Medicare HMO | Source: Ambulatory Visit | Attending: Nephrology | Admitting: Nephrology

## 2017-03-22 DIAGNOSIS — N179 Acute kidney failure, unspecified: Secondary | ICD-10-CM

## 2017-03-22 DIAGNOSIS — N184 Chronic kidney disease, stage 4 (severe): Secondary | ICD-10-CM

## 2017-04-09 ENCOUNTER — Ambulatory Visit: Payer: Medicare HMO | Admitting: Family

## 2017-04-09 ENCOUNTER — Encounter (HOSPITAL_COMMUNITY): Payer: Medicare HMO

## 2019-07-15 ENCOUNTER — Other Ambulatory Visit: Payer: Self-pay

## 2019-07-15 ENCOUNTER — Emergency Department (HOSPITAL_COMMUNITY): Payer: Medicare HMO

## 2019-07-15 ENCOUNTER — Inpatient Hospital Stay (HOSPITAL_COMMUNITY)
Admission: EM | Admit: 2019-07-15 | Discharge: 2019-07-23 | DRG: 377 | Disposition: A | Discharge status: Home Health | Payer: Medicare HMO | Attending: Student | Admitting: Student

## 2019-07-15 ENCOUNTER — Inpatient Hospital Stay (HOSPITAL_COMMUNITY): Payer: Medicare HMO

## 2019-07-15 ENCOUNTER — Encounter (HOSPITAL_COMMUNITY): Payer: Self-pay | Admitting: Emergency Medicine

## 2019-07-15 DIAGNOSIS — K922 Gastrointestinal hemorrhage, unspecified: Secondary | ICD-10-CM

## 2019-07-15 DIAGNOSIS — N401 Enlarged prostate with lower urinary tract symptoms: Secondary | ICD-10-CM | POA: Diagnosis present

## 2019-07-15 DIAGNOSIS — R001 Bradycardia, unspecified: Secondary | ICD-10-CM | POA: Diagnosis not present

## 2019-07-15 DIAGNOSIS — E875 Hyperkalemia: Secondary | ICD-10-CM | POA: Diagnosis present

## 2019-07-15 DIAGNOSIS — D62 Acute posthemorrhagic anemia: Secondary | ICD-10-CM | POA: Diagnosis present

## 2019-07-15 DIAGNOSIS — R5381 Other malaise: Secondary | ICD-10-CM | POA: Diagnosis not present

## 2019-07-15 DIAGNOSIS — D696 Thrombocytopenia, unspecified: Secondary | ICD-10-CM | POA: Diagnosis present

## 2019-07-15 DIAGNOSIS — I5021 Acute systolic (congestive) heart failure: Secondary | ICD-10-CM | POA: Diagnosis not present

## 2019-07-15 DIAGNOSIS — K5731 Diverticulosis of large intestine without perforation or abscess with bleeding: Principal | ICD-10-CM | POA: Diagnosis present

## 2019-07-15 DIAGNOSIS — F17290 Nicotine dependence, other tobacco product, uncomplicated: Secondary | ICD-10-CM | POA: Diagnosis present

## 2019-07-15 DIAGNOSIS — E870 Hyperosmolality and hypernatremia: Secondary | ICD-10-CM | POA: Diagnosis not present

## 2019-07-15 DIAGNOSIS — G8929 Other chronic pain: Secondary | ICD-10-CM | POA: Diagnosis present

## 2019-07-15 DIAGNOSIS — E872 Acidosis: Secondary | ICD-10-CM | POA: Diagnosis present

## 2019-07-15 DIAGNOSIS — K921 Melena: Secondary | ICD-10-CM

## 2019-07-15 DIAGNOSIS — I739 Peripheral vascular disease, unspecified: Secondary | ICD-10-CM | POA: Diagnosis present

## 2019-07-15 DIAGNOSIS — N189 Chronic kidney disease, unspecified: Secondary | ICD-10-CM | POA: Diagnosis not present

## 2019-07-15 DIAGNOSIS — Z8673 Personal history of transient ischemic attack (TIA), and cerebral infarction without residual deficits: Secondary | ICD-10-CM | POA: Diagnosis not present

## 2019-07-15 DIAGNOSIS — I493 Ventricular premature depolarization: Secondary | ICD-10-CM | POA: Diagnosis not present

## 2019-07-15 DIAGNOSIS — N184 Chronic kidney disease, stage 4 (severe): Secondary | ICD-10-CM | POA: Diagnosis present

## 2019-07-15 DIAGNOSIS — D7589 Other specified diseases of blood and blood-forming organs: Secondary | ICD-10-CM | POA: Diagnosis present

## 2019-07-15 DIAGNOSIS — I10 Essential (primary) hypertension: Secondary | ICD-10-CM | POA: Diagnosis not present

## 2019-07-15 DIAGNOSIS — I5022 Chronic systolic (congestive) heart failure: Secondary | ICD-10-CM | POA: Diagnosis not present

## 2019-07-15 DIAGNOSIS — D123 Benign neoplasm of transverse colon: Secondary | ICD-10-CM | POA: Diagnosis present

## 2019-07-15 DIAGNOSIS — Z7902 Long term (current) use of antithrombotics/antiplatelets: Secondary | ICD-10-CM | POA: Diagnosis not present

## 2019-07-15 DIAGNOSIS — D631 Anemia in chronic kidney disease: Secondary | ICD-10-CM | POA: Diagnosis present

## 2019-07-15 DIAGNOSIS — N179 Acute kidney failure, unspecified: Secondary | ICD-10-CM

## 2019-07-15 DIAGNOSIS — Z538 Procedure and treatment not carried out for other reasons: Secondary | ICD-10-CM | POA: Diagnosis not present

## 2019-07-15 DIAGNOSIS — I071 Rheumatic tricuspid insufficiency: Secondary | ICD-10-CM | POA: Diagnosis present

## 2019-07-15 DIAGNOSIS — R54 Age-related physical debility: Secondary | ICD-10-CM | POA: Diagnosis present

## 2019-07-15 DIAGNOSIS — I130 Hypertensive heart and chronic kidney disease with heart failure and stage 1 through stage 4 chronic kidney disease, or unspecified chronic kidney disease: Secondary | ICD-10-CM | POA: Diagnosis present

## 2019-07-15 DIAGNOSIS — I420 Dilated cardiomyopathy: Secondary | ICD-10-CM | POA: Diagnosis not present

## 2019-07-15 DIAGNOSIS — N35912 Unspecified bulbous urethral stricture, male: Secondary | ICD-10-CM | POA: Diagnosis present

## 2019-07-15 DIAGNOSIS — K219 Gastro-esophageal reflux disease without esophagitis: Secondary | ICD-10-CM | POA: Diagnosis present

## 2019-07-15 DIAGNOSIS — N138 Other obstructive and reflux uropathy: Secondary | ICD-10-CM | POA: Diagnosis not present

## 2019-07-15 DIAGNOSIS — K648 Other hemorrhoids: Secondary | ICD-10-CM | POA: Diagnosis not present

## 2019-07-15 DIAGNOSIS — D72825 Bandemia: Secondary | ICD-10-CM | POA: Diagnosis not present

## 2019-07-15 DIAGNOSIS — Z20822 Contact with and (suspected) exposure to covid-19: Secondary | ICD-10-CM | POA: Diagnosis present

## 2019-07-15 DIAGNOSIS — Z79899 Other long term (current) drug therapy: Secondary | ICD-10-CM

## 2019-07-15 DIAGNOSIS — N320 Bladder-neck obstruction: Secondary | ICD-10-CM | POA: Diagnosis present

## 2019-07-15 DIAGNOSIS — M549 Dorsalgia, unspecified: Secondary | ICD-10-CM | POA: Diagnosis present

## 2019-07-15 DIAGNOSIS — I6930 Unspecified sequelae of cerebral infarction: Secondary | ICD-10-CM

## 2019-07-15 DIAGNOSIS — D649 Anemia, unspecified: Secondary | ICD-10-CM | POA: Diagnosis not present

## 2019-07-15 DIAGNOSIS — I361 Nonrheumatic tricuspid (valve) insufficiency: Secondary | ICD-10-CM | POA: Diagnosis not present

## 2019-07-15 DIAGNOSIS — N35919 Unspecified urethral stricture, male, unspecified site: Secondary | ICD-10-CM | POA: Diagnosis not present

## 2019-07-15 DIAGNOSIS — I441 Atrioventricular block, second degree: Secondary | ICD-10-CM | POA: Diagnosis present

## 2019-07-15 DIAGNOSIS — R55 Syncope and collapse: Secondary | ICD-10-CM

## 2019-07-15 DIAGNOSIS — Z8249 Family history of ischemic heart disease and other diseases of the circulatory system: Secondary | ICD-10-CM

## 2019-07-15 DIAGNOSIS — Z7982 Long term (current) use of aspirin: Secondary | ICD-10-CM

## 2019-07-15 HISTORY — DX: Chronic kidney disease, stage 3 unspecified: N18.30

## 2019-07-15 LAB — RESPIRATORY PANEL BY RT PCR (FLU A&B, COVID)
Influenza A by PCR: NEGATIVE
Influenza B by PCR: NEGATIVE
SARS Coronavirus 2 by RT PCR: NEGATIVE

## 2019-07-15 LAB — FOLATE: Folate: 7.3 ng/mL (ref >5.9)

## 2019-07-15 LAB — CBC WITH DIFFERENTIAL/PLATELET
Abs Immature Granulocytes: 0.04 10*3/uL (ref 0.00–0.07)
Basophils Absolute: 0.1 10*3/uL (ref 0.0–0.1)
Basophils Relative: 1 %
Eosinophils Absolute: 0 10*3/uL (ref 0.0–0.5)
Eosinophils Relative: 0 %
HCT: 19.2 % — ABNORMAL LOW (ref 39.0–52.0)
Hemoglobin: 5.7 g/dL — CL (ref 13.0–17.0)
Immature Granulocytes: 0 %
Lymphocytes Relative: 17 %
Lymphs Abs: 1.8 10*3/uL (ref 0.7–4.0)
MCH: 31.5 pg (ref 26.0–34.0)
MCHC: 29.7 g/dL — ABNORMAL LOW (ref 30.0–36.0)
MCV: 106.1 fL — ABNORMAL HIGH (ref 80.0–100.0)
Monocytes Absolute: 0.7 10*3/uL (ref 0.1–1.0)
Monocytes Relative: 7 %
Neutro Abs: 7.7 10*3/uL (ref 1.7–7.7)
Neutrophils Relative %: 75 %
Platelets: 182 10*3/uL (ref 150–400)
RBC: 1.81 MIL/uL — ABNORMAL LOW (ref 4.22–5.81)
RDW: 16.9 % — ABNORMAL HIGH (ref 11.5–15.5)
WBC: 10.3 10*3/uL (ref 4.0–10.5)
nRBC: 0.6 % — ABNORMAL HIGH (ref 0.0–0.2)

## 2019-07-15 LAB — BASIC METABOLIC PANEL
Anion gap: 11 (ref 5–15)
BUN: 47 mg/dL — ABNORMAL HIGH (ref 8–23)
CO2: 16 mmol/L — ABNORMAL LOW (ref 22–32)
Calcium: 8.6 mg/dL — ABNORMAL LOW (ref 8.9–10.3)
Chloride: 115 mmol/L — ABNORMAL HIGH (ref 98–111)
Creatinine, Ser: 7.29 mg/dL — ABNORMAL HIGH (ref 0.61–1.24)
GFR calc Af Amer: 7 mL/min — ABNORMAL LOW (ref >60)
GFR calc non Af Amer: 6 mL/min — ABNORMAL LOW (ref >60)
Glucose, Bld: 100 mg/dL — ABNORMAL HIGH (ref 70–99)
Potassium: 5.2 mmol/L — ABNORMAL HIGH (ref 3.5–5.1)
Sodium: 142 mmol/L (ref 135–145)

## 2019-07-15 LAB — RETICULOCYTES
Immature Retic Fract: 36.7 % — ABNORMAL HIGH (ref 2.3–15.9)
RBC.: 3 MIL/uL — ABNORMAL LOW (ref 4.22–5.81)
Retic Count, Absolute: 79.8 10*3/uL (ref 19.0–186.0)
Retic Ct Pct: 2.7 % (ref 0.4–3.1)

## 2019-07-15 LAB — COMPREHENSIVE METABOLIC PANEL
ALT: 11 U/L (ref 0–44)
AST: 13 U/L — ABNORMAL LOW (ref 15–41)
Albumin: 3.6 g/dL (ref 3.5–5.0)
Alkaline Phosphatase: 104 U/L (ref 38–126)
Anion gap: 12 (ref 5–15)
BUN: 46 mg/dL — ABNORMAL HIGH (ref 8–23)
CO2: 17 mmol/L — ABNORMAL LOW (ref 22–32)
Calcium: 8.9 mg/dL (ref 8.9–10.3)
Chloride: 115 mmol/L — ABNORMAL HIGH (ref 98–111)
Creatinine, Ser: 6.06 mg/dL — ABNORMAL HIGH (ref 0.61–1.24)
GFR calc Af Amer: 9 mL/min — ABNORMAL LOW (ref >60)
GFR calc non Af Amer: 8 mL/min — ABNORMAL LOW (ref >60)
Glucose, Bld: 114 mg/dL — ABNORMAL HIGH (ref 70–99)
Potassium: 5.3 mmol/L — ABNORMAL HIGH (ref 3.5–5.1)
Sodium: 144 mmol/L (ref 135–145)
Total Bilirubin: 0.6 mg/dL (ref 0.3–1.2)
Total Protein: 6.5 g/dL (ref 6.5–8.1)

## 2019-07-15 LAB — PROTIME-INR
INR: 1.3 — ABNORMAL HIGH (ref 0.8–1.2)
Prothrombin Time: 16.2 seconds — ABNORMAL HIGH (ref 11.4–15.2)

## 2019-07-15 LAB — HEMOGLOBIN AND HEMATOCRIT, BLOOD
HCT: 27.1 % — ABNORMAL LOW (ref 39.0–52.0)
Hemoglobin: 8.4 g/dL — ABNORMAL LOW (ref 13.0–17.0)

## 2019-07-15 LAB — IRON AND TIBC
Iron: 61 ug/dL (ref 45–182)
Saturation Ratios: 19 % (ref 17.9–39.5)
TIBC: 315 ug/dL (ref 250–450)
UIBC: 254 ug/dL

## 2019-07-15 LAB — FERRITIN: Ferritin: 24 ng/mL (ref 24–336)

## 2019-07-15 LAB — PREPARE RBC (CROSSMATCH)

## 2019-07-15 LAB — LIPASE, BLOOD: Lipase: 50 U/L (ref 11–51)

## 2019-07-15 LAB — VITAMIN B12: Vitamin B-12: 262 pg/mL (ref 180–914)

## 2019-07-15 MED ORDER — ALBUTEROL SULFATE (2.5 MG/3ML) 0.083% IN NEBU: 2.5000 mg | INHALATION_SOLUTION | RESPIRATORY_TRACT | Status: DC | PRN

## 2019-07-15 MED ORDER — TIZANIDINE HCL 4 MG PO TABS
4.0000 mg | ORAL_TABLET | Freq: Two times a day (BID) | ORAL | Status: DC
  Administered 2019-07-15 – 2019-07-16 (×2): 4 mg via ORAL
  Filled 2019-07-15 (×3): qty 1

## 2019-07-15 MED ORDER — ONDANSETRON HCL 4 MG PO TABS: 4.0000 mg | ORAL_TABLET | Freq: Four times a day (QID) | ORAL | Status: DC | PRN

## 2019-07-15 MED ORDER — SODIUM CHLORIDE 0.9 % IV SOLN
8.0000 mg/h | INTRAVENOUS | Status: DC
  Administered 2019-07-15: 8 mg/h via INTRAVENOUS
  Filled 2019-07-15: qty 80

## 2019-07-15 MED ORDER — ACETAMINOPHEN 325 MG PO TABS
650.0000 mg | ORAL_TABLET | Freq: Four times a day (QID) | ORAL | Status: DC | PRN
  Administered 2019-07-15 – 2019-07-18 (×2): 650 mg via ORAL
  Filled 2019-07-15 (×2): qty 2

## 2019-07-15 MED ORDER — SODIUM CHLORIDE 0.9 % IV SOLN: INTRAVENOUS | Status: DC

## 2019-07-15 MED ORDER — ACETAMINOPHEN 650 MG RE SUPP: 650.0000 mg | Freq: Four times a day (QID) | RECTAL | Status: DC | PRN

## 2019-07-15 MED ORDER — ATORVASTATIN CALCIUM 10 MG PO TABS
20.0000 mg | ORAL_TABLET | Freq: Every day | ORAL | Status: DC
  Administered 2019-07-16 – 2019-07-23 (×7): 20 mg via ORAL
  Filled 2019-07-15 (×9): qty 2

## 2019-07-15 MED ORDER — SODIUM CHLORIDE 0.9 % IV SOLN
80.0000 mg | Freq: Once | INTRAVENOUS | Status: AC
  Administered 2019-07-15: 80 mg via INTRAVENOUS
  Filled 2019-07-15: qty 80

## 2019-07-15 MED ORDER — ASPIRIN EC 81 MG PO TBEC
81.0000 mg | DELAYED_RELEASE_TABLET | Freq: Every day | ORAL | Status: DC
  Administered 2019-07-16 – 2019-07-23 (×7): 81 mg via ORAL
  Filled 2019-07-15 (×8): qty 1

## 2019-07-15 MED ORDER — FAMOTIDINE 20 MG PO TABS: 20.0000 mg | ORAL_TABLET | Freq: Every day | ORAL | Status: DC

## 2019-07-15 MED ORDER — FUROSEMIDE 10 MG/ML IJ SOLN
20.0000 mg | Freq: Once | INTRAMUSCULAR | Status: AC
  Administered 2019-07-15: 20 mg via INTRAVENOUS
  Filled 2019-07-15: qty 2

## 2019-07-15 MED ORDER — LIDOCAINE HCL URETHRAL/MUCOSAL 2 % EX GEL
1.0000 "application " | Freq: Once | CUTANEOUS | Status: DC
  Filled 2019-07-15: qty 20

## 2019-07-15 MED ORDER — ONDANSETRON HCL 4 MG/2ML IJ SOLN: 4.0000 mg | Freq: Four times a day (QID) | INTRAMUSCULAR | Status: DC | PRN

## 2019-07-15 MED ORDER — SODIUM CHLORIDE 0.9 % IV SOLN
10.0000 mL/h | Freq: Once | INTRAVENOUS | Status: AC
  Administered 2019-07-15: 10 mL/h via INTRAVENOUS

## 2019-07-15 MED ORDER — SODIUM BICARBONATE 650 MG PO TABS
1300.0000 mg | ORAL_TABLET | Freq: Two times a day (BID) | ORAL | Status: DC
  Administered 2019-07-15 – 2019-07-23 (×15): 1300 mg via ORAL
  Filled 2019-07-15 (×16): qty 2

## 2019-07-15 MED ORDER — VITAMIN D (ERGOCALCIFEROL) 1.25 MG (50000 UNIT) PO CAPS
50000.0000 [IU] | ORAL_CAPSULE | ORAL | Status: DC
  Filled 2019-07-15: qty 1

## 2019-07-15 MED ORDER — PREGABALIN 75 MG PO CAPS
75.0000 mg | ORAL_CAPSULE | Freq: Every day | ORAL | Status: DC
  Administered 2019-07-16 – 2019-07-23 (×7): 75 mg via ORAL
  Filled 2019-07-15 (×8): qty 1

## 2019-07-15 MED ORDER — PANTOPRAZOLE SODIUM 40 MG PO TBEC
40.0000 mg | DELAYED_RELEASE_TABLET | Freq: Every day | ORAL | Status: DC
  Administered 2019-07-15 – 2019-07-23 (×8): 40 mg via ORAL
  Filled 2019-07-15 (×9): qty 1

## 2019-07-15 MED ORDER — HYDRALAZINE HCL 25 MG PO TABS: 25.0000 mg | ORAL_TABLET | Freq: Four times a day (QID) | ORAL | Status: DC | PRN

## 2019-07-15 NOTE — Consult Note (Addendum)
Bobby Vega: 2:10 PM 07/15/2019  LOS: 0 days    Referring Provider: Dr Bobby Vega in ED  Primary Care Physician:  Bobby Landsman, MD Primary Gastroenterologist:  Dr. Benson Vega, [facility].  Unassigned.     Reason for Consultation: Painless hematochezia.   HPI: Bobby Vega is a 85 y.o. male.  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  BPH.  CVA .  Htn.  PVD.  aorto-bifem BPG 2013.   CKD 4 in 2017.  Urethral stricture , dilated at cystoscopy 2017.  Aspiration PNA, dysphagia 04/2016.   Meds include Plavix and 81 ASA.   e.  2005 Colonoscopy.  Dr Bobby Vega:  Area of compression and possible mass in ascending colon, marked compression in this area.  Biopsies obtained.  Diverticulosis.  External hemorrhoids.  Path: polypoid fragments w lymphoid aggregates, no adenomatous change.  Some HP polyps.   Colonic diverticulae per 04/2000 Barium enema, CT 2004.  Marland Kitchen     Rectal bleeding for last 2 weeks.  Episodes of hematochezia 2 or 3 times a day.  No abdominal pain.  No nausea.  Appetite chronically poor and probable weight loss but patient is not able to quantify.  Progressive weakness, lethargy and that is starting from a generally sedentary activity level..  No chest or abd pain.   Hgb  5.7.  1.8 in 04/2016.  MCV 106.  INR 1.3.   PRBCs x 2 ordered.   AKI vs progression to stage 5 CKD,  Creat elevation >> than BUN elevation a 46/6.   +++ bacteria in urine w leukocytes, 7-10 WBCs.   covid 19 negative.        Past Medical History:  Diagnosis Date  . Arthritis    hip  . Borderline diabetes   . BPH (benign prostatic hyperplasia)    takes Avodart daily  . Bronchitis    yrs ago  . Cervical spondylosis with myelopathy   . Cervical spondylosis without myelopathy   . Chronic back pain   . Constipation    Metamucil prn  . Diverticulosis    . ED (erectile dysfunction)    takes Cialis as needed as well as Viagra  . Embolism - blood clot    in stomach  . GERD (gastroesophageal reflux disease)    takes Nexium daily  . Hyperlipidemia    takes Zocor nightly  . Hypertension    takes Losartan daily  . Lumbar spondylosis with myelopathy   . Nocturia   . Pain in joint, hand   . Peripheral vascular disease (Bobby Vega)   . Stroke (Bobby Vega)   . Urinary urgency     Past Surgical History:  Procedure Laterality Date  . ABDOMINAL AORTAGRAM N/A 07/04/2011   Procedure: ABDOMINAL Bobby Vega;  Surgeon: Bobby Mitchell, MD;  Location[facility] CATH LAB;  Service: Cardiovascular;  Laterality: N/A;  . AORTA - BILATERAL FEMORAL ARTERY BYPASS GRAFT  07/13/2011   Procedure: AORTA BIFEMORAL BYPASS GRAFT;  Surgeon: Bobby Burrow, MD;  Location: [place];  Service: Vascular;  Laterality: Bilateral;  . CYSTOSCOPY  07/13/2011   Procedure: CYSTOSCOPY;  Surgeon: Bobby Ben, MD;  Location: [place];  Service: Urology;  Laterality: N/A;  cystoscopy,dilatation & insertion of councill foley catheter  . LOWER EXTREMITY ANGIOGRAM Bilateral 07/04/2011   Procedure: LOWER EXTREMITY ANGIOGRAM;  Surgeon: Bobby Mitchell, MD;  Location: [facility] CATH LAB;  Service: Cardiovascular;  Laterality: Bilateral;  . SKIN GRAFT  1983    Prior to Admission medications   Medication Sig Start Date End Date Taking? Authorizing Provider  albuterol (VENTOLIN HFA) 108 (90 Base) MCG/ACT inhaler Inhale 1-2 puffs into the lungs every 4 (four) hours as needed for shortness of breath or wheezing. 05/29/19  Yes [provider]  amLODipine (NORVASC) 10 MG tablet Take 10 mg by mouth daily. 04/25/19  Yes [provider]  aspirin EC 81 MG tablet Take 81 mg by mouth daily.   Yes [provider]  atorvastatin (LIPITOR) 20 MG tablet Take 20 mg by mouth daily.   Yes [provider]  clopidogrel (PLAVIX) 75 MG tablet Take 1 tablet (75 mg total) by mouth daily with breakfast.  05/18/13  Yes Rai, Ripudeep K, MD  famotidine (PEPCID) 20 MG tablet Take 20 mg by mouth daily.   Yes [provider]  hydrALAZINE (APRESOLINE) 25 MG tablet Take 1 tablet (25 mg total) by mouth 2 (two) times daily. 05/18/13  Yes Rai, Ripudeep K, MD  pregabalin (LYRICA) 75 MG capsule Take 75 mg by mouth daily. 03/21/19  Yes [provider]  tiZANidine (ZANAFLEX) 4 MG tablet Take 4 mg by mouth 2 (two) times daily. 07/07/19  Yes [provider]  Vitamin D, Ergocalciferol, (DRISDOL) 50000 UNITS CAPS capsule Take 50,000 Units by mouth every Sunday.    Yes [provider]  carvedilol (COREG) 6.25 MG tablet Take 1 tablet (6.25 mg total) by mouth 2 (two) times daily with a meal. Patient not taking: Reported on 07/15/2019 05/23/16   Bobby Phi, DO    Scheduled Meds:  Infusions: . sodium chloride 125 mL/hr at 07/15/19 1035  . pantoprozole (PROTONIX) infusion 8 mg/hr (07/15/19 1137)   PRN Meds:    Allergies as of 07/15/2019  . (No Known Allergies)    Family History  Problem Relation Age of Onset  . Heart disease Mother        Before age 1  . Heart disease Brother        NOT  before age 82  . Heart attack Brother   . Anesthesia problems Neg Hx   . Hypotension Neg Hx   . Malignant hyperthermia Neg Hx   . Pseudochol deficiency Neg Hx     Social History   Socioeconomic History  . Marital status: Widowed    Spouse name: Not on file  . Number of children: Not on file  . Years of education: Not on file  . Highest education level: Not on file  Occupational History  . Not on file  Tobacco Use  . Smoking status: Current Some Day Smoker    Years: 0.50    Types: Cigars    Last attempt to quit: 04/29/1992    Years since quitting: 27.2  . Smokeless tobacco: Never Used  Substance and Sexual Activity  . Alcohol use: No  . Drug use: No    Types: Marijuana    Comment: Hx  . Sexual activity: Not on file  Other Topics Concern  . Not on file  Social  History Narrative  . Not on file   Social Determinants of Health   Financial Resource Strain:   .  Difficulty of Paying Living Expenses: Not on file  Food Insecurity:   . Worried About Charity fundraiser in the Last Year: Not on file  . Ran Out of Food in the Last Year: Not on file  Transportation Needs:   . Lack of Transportation (Medical): Not on file  . Lack of Transportation (Non-Medical): Not on file  Physical Activity:   . Days of Exercise per Week: Not on file  . Minutes of Exercise per Session: Not on file  Stress:   . Feeling of Stress : Not on file  Social Connections:   . Frequency of Communication with Friends and Family: Not on file  . Frequency of Social Gatherings with Friends and Family: Not on file  . Attends Religious Services: Not on file  . Active Member of Clubs or Organizations: Not on file  . Attends Archivist Meetings: Not on file  . Marital Status: Not on file  Intimate Partner Violence:   . Fear of Current or Ex-Partner: Not on file  . Emotionally Abused: Not on file  . Physically Abused: Not on file  . Sexually Abused: Not on file    REVIEW OF SYSTEMS: Constitutional: Weakness. ENT:  No nose bleeds Pulm: No dyspnea.  No cough. CV:  No palpitations, no LE edema.  No chest pain, no angina. GU: Difficulty voiding but no pain on voiding.  Gets up a lot during the night and day in order to urinate, not always able to void. GI: No dysphagia.  See HPI. Heme: Other than the rectal bleeding, patient denies unusual or excessive bleeding. Transfusions: Previous transfusions Neuro: No syncope.  No seizures.  No headaches, no peripheral tingling or numbness.  Chronic right upper extremity weakness. Derm:  No itching, no rash or sores.  Endocrine:  No sweats or chills.  No polyuria or dysuria Immunization: Not queried.  Last documented shots were flu shot and Pneumovax in 2017.   PHYSICAL EXAM: Vital signs in last 24 hours: Vitals:   07/15/19  1300 07/15/19 1339  BP: (!) 148/81   Pulse: 86   Resp: (!) 22   Temp:  97.6 F (36.4 C)  SpO2: 97%    Wt Readings from Last 3 Encounters:  05/28/16 71.7 kg  05/23/16 71.7 kg  04/04/16 70.3 kg    General: Frail, chronically ill, somewhat confused and difficult to understand AAM. Head: No facial asymmetry or swelling.  No signs of head trauma. Eyes: No scleral icterus.  No conjunctival pallor. Ears: Not hard of hearing Nose: No discharge or congestion Mouth: Somewhat dry oral mucosa.  Tongue midline.  Full dentures in place, not removed for exam. Neck: No JVD, no masses, no thyromegaly. Lungs: No labored breathing.  No cough.  Lungs clear bilaterally Heart: RRR.  No MRG.  S1, S2 present.  Heart sounds are distant. Abdomen: Soft.  Not tender, not distended.  No HSM, masses, bruits, hernias.  Well-healed long midline scar.  No HSM, masses, bruits, hernias..   Rectal: No masses, no red blood.  Stool present and is medium to light brown, soft.  On DRE earlier by the ED physician, there was gross red blood. Musc/Skeltl: No joint redness or swelling. Extremities: No CCE. Neurologic: Alert.  Appropriate, follows commands.  Oriented to self.  Not to year or place but does know he is in the hospital Skin: No rash, no sores, no telangiectasia Tattoos: None observed Nodes: No cervical adenopathy Psych: Calm, pleasant, cooperative  Intake/Output from previous day:  No intake/output data recorded. Intake/Output this shift: Total I/O In: 415 [Blood:315; IV Piggyback:100] Out: -   LAB RESULTS: Recent Labs    07/15/19 1003  WBC 10.3  HGB 5.7*  HCT 19.2*  PLT 182   BMET Lab Results  Component Value Date   NA 144 07/15/2019   NA 140 05/23/2016   NA 142 05/22/2016   Vega 5.3 (H) 07/15/2019   Vega 3.5 05/23/2016   Vega 3.6 05/22/2016   CL 115 (H) 07/15/2019   CL 113 (H) 05/23/2016   CL 115 (H) 05/22/2016   CO2 17 (L) 07/15/2019   CO2 17 (L) 05/23/2016   CO2 16 (L) 05/22/2016   GLUCOSE  114 (H) 07/15/2019   GLUCOSE 81 05/23/2016   GLUCOSE 122 (H) 05/22/2016   BUN 46 (H) 07/15/2019   BUN 41 (H) 05/23/2016   BUN 48 (H) 05/22/2016   CREATININE 6.06 (H) 07/15/2019   CREATININE 2.51 (H) 05/23/2016   CREATININE 2.39 (H) 05/22/2016   CALCIUM 8.9 07/15/2019   CALCIUM 8.7 (L) 05/23/2016   CALCIUM 8.7 (L) 05/22/2016   LFT Recent Labs    07/15/19 1003  PROT 6.5  ALBUMIN 3.6  AST 13*  ALT 11  ALKPHOS 104  BILITOT 0.6   PT/INR Lab Results  Component Value Date   INR 1.3 (H) 07/15/2019   INR 1.10 05/13/2016   INR 1.08 05/16/2013   Hepatitis Panel No results for input(s): HEPBSAG, HCVAB, HEPAIGM, HEPBIGM in the last 72 hours. C-Diff No components found for: CDIFF Lipase     Component Value Date/Time   LIPASE 50 07/15/2019 1003    Drugs of Abuse     Component Value Date/Time   LABOPIA NONE DETECTED 05/17/2013 0530   COCAINSCRNUR NONE DETECTED 05/17/2013 0530   LABBENZ NONE DETECTED 05/17/2013 0530   AMPHETMU NONE DETECTED 05/17/2013 0530   THCU POSITIVE (A) 05/17/2013 0530   LABBARB NONE DETECTED 05/17/2013 0530     RADIOLOGY STUDIES: DG Chest Port 1 View  Result Date: 07/15/2019 CLINICAL DATA:  Recent syncopal episode EXAM: PORTABLE CHEST 1 VIEW COMPARISON:  05/19/2016 FINDINGS: Cardiac shadow is mildly enlarged but accentuated by the portable technique. Small right-sided pleural effusion is noted. There is likely underlying basilar atelectasis. No other focal infiltrate is seen. No bony abnormality is noted. IMPRESSION: Small right pleural effusion with underlying basilar atelectasis. Electronically Signed   By: Inez Catalina M.D.   On: 07/15/2019 10:54     IMPRESSION:   *   Severe anemia. Macrocytic   *   Rectal bleeding for 2 weeks.  Question diverticular source.  Question mass.  Question AVMs.  *   CKD stage 5 vs AKI.  Previously stage 4 CKD 2017.    *    Hyperkalemia.  *   Hx CVA.  Chronic Plavix and 81 ASA.  Date of last Plavix was this  morning, 1/19   PLAN:     *    Hold Plavix.?  Pursue colonoscopy once Plavix has washed out (5 days)? Follow-up CBC after completing transfusion.  *   No need for n.p.o., ordered a soft diet.  *    Ordered anemia panel.  *    I do not see indication for Protonix drip so I have discontinued this. Begin empiric Protonix 40 mg daily tomorrow.   Azucena Freed  07/15/2019, 2:10 PM Phone [phone]   Attending physician's note   I have taken a history, examined the patient and reviewed the chart.  I agree with the Advanced Practitioner's note, impression and recommendations.  85 year old male with CKD, SMA thrombosis, peripheral vascular disease s/p aorto femoral bypass aspirin and Plavix admitted with severe anemia and hematochezia for past 2 weeks Patient states he has been having on and off bleeding per rectum for past year  Abdomen soft with no rebound or tenderness  Hemoglobin 5.7 on admission, getting PRBC Monitor hemoglobin and transfuse as needed to greater than 7  Hold Plavix  Will need to exclude neoplastic lesion/malignancy.  Differential also includes hemorrhoidal hemorrhage, diverticular hemorrhage or less likely ischemic colitis  Plan for colonoscopy +/-EGD (less likely upper GI bleed given his presentation but will need to exclude brisk upper bleed)  Continue Protonix twice daily  Clear liquid diet  GI will continue to follow   Vega. Denzil Magnuson , MD [phone]

## 2019-07-15 NOTE — Progress Notes (Signed)
Spoke with daugther via telephone

## 2019-07-15 NOTE — ED Notes (Signed)
Patient transported to US 

## 2019-07-15 NOTE — Procedures (Signed)
Urology procedure note:  85 year old male with reported history of BPH, hypertension, stroke, PVD, on aspirin and Plavix who was admitted with rectal bleeding.  Anemic to 5.2.  Creatinine 7.2 from a baseline closer to 2.  Patient with low urine output since admission (although appears to be voiding small volumes).  Bladder scan greater than 450 cc.  Nursing attempted catheterization x3 and were unsuccessful with coude  I discussed the case with the hospitalist who stated he needed a catheter for accurate ins and outs given need for aggressive resuscitation in this medically complex patient.  After sterile prepping and draping a Urojet was injected into the urethra.  An 62 French coud catheter was then inserted into the penis but resistance was met and bulbar urethra.  Catheter was removed.  We then performed cystoscopy which showed a pinpoint urethral stricture in the bulbar urethra.  We able to pass a sensor wire through the scope past the stricture.    At this point we paused the procedure to update the patient's daughter.  She agreed with proceeding with urethral dilation at the bedside as the patient was tolerating the above procedure well.  The patient also agreed but at this point was some not somnolent.    A 24 French NephroMax balloon was then advanced over the wire past the stricture under direct guidance of the cystoscope.  The balloon was inflated to 22 PSI for 30 seconds.  Balloon was deflated.  There is no bleeding.  Scope and balloon were removed.  A 16 French council tip catheter was then advanced over the wire without resistance to the hub.  Light yellow urine returned indicating proper placement and 10 cc of sterile water were placed in the balloon.   The catheter was attached to a drainage bag.  Plan: -Do not remove the catheter without speaking to urology first  - ceftriaxone x3 days for prophylaxis given instrumentation -Keep Foley catheter in place for approximately 5 days.   07/21/2019.  This will allow for urethral dilation to heal.  Urology will then place a trial of void order -We will request outpatient follow-up for urethral stricture   Urology will continue to follow along.

## 2019-07-15 NOTE — H&P (Signed)
History and Physical    Bobby Vega RKY:[id number] DOB: 16-Mar-1935 DOA: 07/15/2019  PCP: Lin Landsman, MD   Patient coming from: Home  I have personally briefly reviewed patient's old medical records in [place]  Chief Complaint: GI bleed  HPI: Bobby Vega is a 85 y.o. male with medical history significant of HTN, Stroke, BPH,PVD with aorta-femoral bypass on ASA and plavix presented with rectal bleeding for 2 weeks.  Episodes of bright red bowel movement 2 to 3 times a day.  No abdominal pain.  No nausea. Also has progressive weakness, and feeling sleepy all day. This morning, pt had another hematochezia episode, associated with subsequent feeling of lightheadedness and presyncope, no chest or abd pain.  He is aware his kidneys "are not great" but never seen by any specialist.  ED Course: Hemoglobin 5.2, patient is to start PRBC transfusion x2.  Review of Systems: As per HPI otherwise 10 point review of systems negative.    Past Medical History:  Diagnosis Date  . Arthritis    hip  . Borderline diabetes   . BPH (benign prostatic hyperplasia)    takes Avodart daily  . Bronchitis    yrs ago  . Cervical spondylosis with myelopathy   . Cervical spondylosis without myelopathy   . Chronic back pain   . Constipation    Metamucil prn  . Diverticulosis   . ED (erectile dysfunction)    takes Cialis as needed as well as Viagra  . Embolism - blood clot    in stomach  . GERD (gastroesophageal reflux disease)    takes Nexium daily  . Hyperlipidemia    takes Zocor nightly  . Hypertension    takes Losartan daily  . Lumbar spondylosis with myelopathy   . Nocturia   . Pain in joint, hand   . Peripheral vascular disease ([place])   . Stroke ([place])   . Urinary urgency     Past Surgical History:  Procedure Laterality Date  . ABDOMINAL AORTAGRAM N/A 07/04/2011   Procedure: ABDOMINAL Maxcine Ham;  Surgeon: Serafina Mitchell, MD;  Location: [facility] CATH LAB;  Service: Cardiovascular;   Laterality: N/A;  . AORTA - BILATERAL FEMORAL ARTERY BYPASS GRAFT  07/13/2011   Procedure: AORTA BIFEMORAL BYPASS GRAFT;  Surgeon: Theotis Burrow, MD;  Location: [place];  Service: Vascular;  Laterality: Bilateral;  . CYSTOSCOPY  07/13/2011   Procedure: CYSTOSCOPY;  Surgeon: Hanley Ben, MD;  Location: [place];  Service: Urology;  Laterality: N/A;  cystoscopy,dilatation & insertion of councill foley catheter  . LOWER EXTREMITY ANGIOGRAM Bilateral 07/04/2011   Procedure: LOWER EXTREMITY ANGIOGRAM;  Surgeon: Serafina Mitchell, MD;  Location: [facility] CATH LAB;  Service: Cardiovascular;  Laterality: Bilateral;  . SKIN GRAFT  1983     reports that he has been smoking cigars. He has smoked for the past 0.50 years. He has never used smokeless tobacco. He reports that he does not drink alcohol or use drugs.  No Known Allergies  Family History  Problem Relation Age of Onset  . Heart disease Mother        Before age 31  . Heart disease Brother        NOT  before age 57  . Heart attack Brother   . Anesthesia problems Neg Hx   . Hypotension Neg Hx   . Malignant hyperthermia Neg Hx   . Pseudochol deficiency Neg Hx      Prior to Admission medications   Medication Sig Start Date End Date  Taking? Authorizing Provider  albuterol (VENTOLIN HFA) 108 (90 Base) MCG/ACT inhaler Inhale 1-2 puffs into the lungs every 4 (four) hours as needed for shortness of breath or wheezing. 05/29/19  Yes [provider]  amLODipine (NORVASC) 10 MG tablet Take 10 mg by mouth daily. 04/25/19  Yes [provider]  aspirin EC 81 MG tablet Take 81 mg by mouth daily.   Yes [provider]  atorvastatin (LIPITOR) 20 MG tablet Take 20 mg by mouth daily.   Yes [provider]  clopidogrel (PLAVIX) 75 MG tablet Take 1 tablet (75 mg total) by mouth daily with breakfast. 05/18/13  Yes Rai, Ripudeep K, MD  famotidine (PEPCID) 20 MG tablet Take 20 mg by mouth daily.   Yes [provider]    hydrALAZINE (APRESOLINE) 25 MG tablet Take 1 tablet (25 mg total) by mouth 2 (two) times daily. 05/18/13  Yes Rai, Ripudeep K, MD  pregabalin (LYRICA) 75 MG capsule Take 75 mg by mouth daily. 03/21/19  Yes [provider]  tiZANidine (ZANAFLEX) 4 MG tablet Take 4 mg by mouth 2 (two) times daily. 07/07/19  Yes [provider]  Vitamin D, Ergocalciferol, (DRISDOL) 50000 UNITS CAPS capsule Take 50,000 Units by mouth every Sunday.    Yes [provider]  carvedilol (COREG) 6.25 MG tablet Take 1 tablet (6.25 mg total) by mouth 2 (two) times daily with a meal. Patient not taking: Reported on 07/15/2019 05/23/16   Dessa Phi, DO    Physical Exam: Vitals:   07/15/19 1330 07/15/19 1339 07/15/19 1400 07/15/19 1430  BP: (!) 147/80  (!) 146/81 (!) 149/84  Pulse: (!) 51  87 95  Resp: 20  20 (!) 23  Temp:  97.6 F (36.4 C)    TempSrc:  Oral    SpO2: 94%  93% 97%    Constitutional: NAD, calm, comfortable Vitals:   07/15/19 1330 07/15/19 1339 07/15/19 1400 07/15/19 1430  BP: (!) 147/80  (!) 146/81 (!) 149/84  Pulse: (!) 51  87 95  Resp: 20  20 (!) 23  Temp:  97.6 F (36.4 C)    TempSrc:  Oral    SpO2: 94%  93% 97%   Eyes: PERRL, lids and conjunctivae normal ENMT: Pale looking. Posterior pharynx clear of any exudate or lesions.Normal dentition.  Neck: normal, supple, no masses, no thyromegaly Respiratory: clear to auscultation bilaterally, crackles to the mid level bilateral lungs. Some what increased respiratory effort. No accessory muscle use.  Cardiovascular: Regular rate and rhythm, no murmurs / rubs / gallops. No extremity edema. 2+ pedal pulses. No carotid bruits.  Abdomen: no tenderness, no masses palpated. No hepatosplenomegaly. Bowel sounds positive.  Musculoskeletal: no clubbing / cyanosis. No joint deformity upper and lower extremities. Good ROM, no contractures. Normal muscle tone.  Skin: no rashes, lesions, ulcers. No induration Neurologic: CN 2-12  grossly intact. Sensation intact, DTR normal. Strength 5/5 in all 4.  Psychiatric: Normal judgment and insight. Alert and oriented x 3. Normal mood.    Labs on Admission: I have personally reviewed following labs and imaging studies  CBC: Recent Labs  Lab 07/15/19 1003  WBC 10.3  NEUTROABS 7.7  HGB 5.7*  HCT 19.2*  MCV 106.1*  PLT 409   Basic Metabolic Panel: Recent Labs  Lab 07/15/19 1003  NA 144  K 5.3*  CL 115*  CO2 17*  GLUCOSE 114*  BUN 46*  CREATININE 6.06*  CALCIUM 8.9   GFR: CrCl cannot be calculated (Unknown ideal weight.).  Liver Function Tests: Recent Labs  Lab 07/15/19 1003  AST 13*  ALT 11  ALKPHOS 104  BILITOT 0.6  PROT 6.5  ALBUMIN 3.6   Recent Labs  Lab 07/15/19 1003  LIPASE 50   No results for input(s): AMMONIA in the last 168 hours. Coagulation Profile: Recent Labs  Lab 07/15/19 1003  INR 1.3*   Cardiac Enzymes: No results for input(s): CKTOTAL, CKMB, CKMBINDEX, TROPONINI in the last 168 hours. BNP (last 3 results) No results for input(s): PROBNP in the last 8760 hours. HbA1C: No results for input(s): HGBA1C in the last 72 hours. CBG: No results for input(s): GLUCAP in the last 168 hours. Lipid Profile: No results for input(s): CHOL, HDL, LDLCALC, TRIG, CHOLHDL, LDLDIRECT in the last 72 hours. Thyroid Function Tests: No results for input(s): TSH, T4TOTAL, FREET4, T3FREE, THYROIDAB in the last 72 hours. Anemia Panel: No results for input(s): VITAMINB12, FOLATE, FERRITIN, TIBC, IRON, RETICCTPCT in the last 72 hours. Urine analysis:    Component Value Date/Time   COLORURINE YELLOW 05/17/2013 0530   APPEARANCEUR CLOUDY (A) 05/17/2013 0530   LABSPEC 1.012 05/17/2013 0530   PHURINE 5.0 05/17/2013 0530   GLUCOSEU NEGATIVE 05/17/2013 0530   HGBUR NEGATIVE 05/17/2013 0530   BILIRUBINUR NEGATIVE 05/17/2013 0530   KETONESUR NEGATIVE 05/17/2013 0530   PROTEINUR 30 (A) 05/17/2013 0530   UROBILINOGEN 0.2 05/17/2013 0530   NITRITE  NEGATIVE 05/17/2013 0530   LEUKOCYTESUR MODERATE (A) 05/17/2013 0530    Radiological Exams on Admission: DG Chest Port 1 View  Result Date: 07/15/2019 CLINICAL DATA:  Recent syncopal episode EXAM: PORTABLE CHEST 1 VIEW COMPARISON:  05/19/2016 FINDINGS: Cardiac shadow is mildly enlarged but accentuated by the portable technique. Small right-sided pleural effusion is noted. There is likely underlying basilar atelectasis. No other focal infiltrate is seen. No bony abnormality is noted. IMPRESSION: Small right pleural effusion with underlying basilar atelectasis. Electronically Signed   By: Inez Catalina M.D.   On: 07/15/2019 10:54    EKG: Independently reviewed.   Assessment/Plan Active Problems:   GI bleed   Lower GI bleed  Lower GI bleed Hold Plavix, continue aspirin as per GI.   CT scan in 2004 showed diverticulosis probably is the source for bleeding this time. Discussed with GI, recommend colonoscopy once patient stabilized and of Plavix for 5 days. H&H every 6 hours. 1 dose of 20 mg Lasix between first and second PRBCs.  Respiratory distress, physical exam showed bilateral crackles, chest x-ray showed bilateral lung congestion, discussed with ED nurse, cut down Maintenance Fluid while Doing Transfusion, 1 dose of Lasix during transfusion.  Re- evaluate volume status after transfusion.  Echo ordered.  AKI on CKD stage III, probably has baseline hypertensive nephropathy, discussed with on-call nephrologist who will see the patient in the ED.  Borderline hyperkalemia, likely related to AKI, IV fluid and recheck.  Non-anion gap metabolic acidosis, related to CKD, will start bicarb p.o..  ABD with history of aorta femoral bypass, hold Plavix, continue aspirin.  Hypertension, hold all his BP meds, and PRN hydralazine for now.   DVT prophylaxis: SCD Code Status: Full Family Communication: [name] the daughter Disposition Plan: Home Consults called: GI and Nephro Admission status:  Tele admission   Lequita Halt MD [facility] Pager [phone]  If 7PM-7AM, please contact night-coverage [email or web address] Password [facility]  07/15/2019, 3:08 PM

## 2019-07-15 NOTE — Progress Notes (Signed)
2 RNs attempted to insert a foley catheter into pt without success. RN reported issue to MD. Order placed for urology consult within the next 12-24 hours. MD placed it as urgent. RN will continue to monitor output and report findings to night shift RN.

## 2019-07-15 NOTE — Consult Note (Signed)
Bobby Vega Admit Date: 07/15/2019 07/15/2019 Rexene Agent Requesting Physician:  Roosevelt Locks MD  Reason for Consult:  Renal Failure, Anemia HPI:  85 year old male with significant medical history including PVD with history of aortobifemoral bypass on DAPT, history of CVA with residual right paresis, hypertension, BPH, history of urethral stricture requiring dilatation, and diastolic heart failure who presented after developing presyncope in the context of subacute intermittent hematochezia.  Upon presentation had a hemoglobin of 5.7, is being transfused at the current time.  GI is following with plans for evaluation during this admission.  He has followed historically at [place] with Dr. Marval Regal, last visit 12/2017; lost to follow-up since that time.  His presenting creatinine today is 6.0.  His creatinine in 2019 was 3.3 when seen in our office.  Labs today also notable for K5.3, HCO3 17, BUN 46, anion gap of 12.  Renal ultrasound pending.  No UA yet.  Denies any LUTS.  He is a poor historian of his medications but thinks he might be taking Advil on a daily basis.  No rashes, lesions, gross hematuria, epistaxis.  He denies nausea/vomiting.  Not sure how good his diet is.  He denies dysgeusia.  Spoke with daughter, updated.  He lives alone with home health coming in 6 days a week.  Creatinine, Ser (mg/dL)  Date Value  07/15/2019 6.06 (H)  05/23/2016 2.51 (H)  05/22/2016 2.39 (H)  05/21/2016 2.61 (H)  05/20/2016 3.20 (H)  05/19/2016 3.30 (H)  05/18/2016 3.42 (H)  05/17/2016 3.33 (H)  05/16/2016 3.34 (H)  05/15/2016 3.68 (H)  ] I/Os:  ROS NSAIDS: Question if using prior to admission? IV Contrast no exposure TMP/SMX no identified exposure Hypotension not present Balance of 12 systems is negative w/ exceptions as above  PMH  Past Medical History:  Diagnosis Date  . Arthritis    hip  . Borderline diabetes   . BPH (benign prostatic hyperplasia)    takes Avodart daily  .  Bronchitis    yrs ago  . Cervical spondylosis with myelopathy   . Cervical spondylosis without myelopathy   . Chronic back pain   . Constipation    Metamucil prn  . Diverticulosis   . ED (erectile dysfunction)    takes Cialis as needed as well as Viagra  . Embolism - blood clot    in stomach  . GERD (gastroesophageal reflux disease)    takes Nexium daily  . Hyperlipidemia    takes Zocor nightly  . Hypertension    takes Losartan daily  . Lumbar spondylosis with myelopathy   . Nocturia   . Pain in joint, hand   . Peripheral vascular disease ([place])   . Stroke ([place])   . Urinary urgency    PSH  Past Surgical History:  Procedure Laterality Date  . ABDOMINAL AORTAGRAM N/A 07/04/2011   Procedure: ABDOMINAL Maxcine Ham;  Surgeon: Serafina Mitchell, MD;  Location: [facility] CATH LAB;  Service: Cardiovascular;  Laterality: N/A;  . AORTA - BILATERAL FEMORAL ARTERY BYPASS GRAFT  07/13/2011   Procedure: AORTA BIFEMORAL BYPASS GRAFT;  Surgeon: Theotis Burrow, MD;  Location: [place];  Service: Vascular;  Laterality: Bilateral;  . CYSTOSCOPY  07/13/2011   Procedure: CYSTOSCOPY;  Surgeon: Hanley Ben, MD;  Location: [place];  Service: Urology;  Laterality: N/A;  cystoscopy,dilatation & insertion of councill foley catheter  . LOWER EXTREMITY ANGIOGRAM Bilateral 07/04/2011   Procedure: LOWER EXTREMITY ANGIOGRAM;  Surgeon: Serafina Mitchell, MD;  Location: [facility] CATH LAB;  Service: Cardiovascular;  Laterality: Bilateral;  . SKIN GRAFT  1983   FH  Family History  Problem Relation Age of Onset  . Heart disease Mother        Before age 83  . Heart disease Brother        NOT  before age 61  . Heart attack Brother   . Anesthesia problems Neg Hx   . Hypotension Neg Hx   . Malignant hyperthermia Neg Hx   . Pseudochol deficiency Neg Hx    SH  reports that he has been smoking cigars. He has smoked for the past 0.50 years. He has never used smokeless tobacco. He reports that he does not drink alcohol or use  drugs. Allergies No Known Allergies Home medications Prior to Admission medications   Medication Sig Start Date End Date Taking? Authorizing Provider  albuterol (VENTOLIN HFA) 108 (90 Base) MCG/ACT inhaler Inhale 1-2 puffs into the lungs every 4 (four) hours as needed for shortness of breath or wheezing. 05/29/19  Yes [provider]  amLODipine (NORVASC) 10 MG tablet Take 10 mg by mouth daily. 04/25/19  Yes [provider]  aspirin EC 81 MG tablet Take 81 mg by mouth daily.   Yes [provider]  atorvastatin (LIPITOR) 20 MG tablet Take 20 mg by mouth daily.   Yes [provider]  clopidogrel (PLAVIX) 75 MG tablet Take 1 tablet (75 mg total) by mouth daily with breakfast. 05/18/13  Yes Rai, Ripudeep K, MD  famotidine (PEPCID) 20 MG tablet Take 20 mg by mouth daily.   Yes [provider]  hydrALAZINE (APRESOLINE) 25 MG tablet Take 1 tablet (25 mg total) by mouth 2 (two) times daily. 05/18/13  Yes Rai, Ripudeep K, MD  pregabalin (LYRICA) 75 MG capsule Take 75 mg by mouth daily. 03/21/19  Yes [provider]  tiZANidine (ZANAFLEX) 4 MG tablet Take 4 mg by mouth 2 (two) times daily. 07/07/19  Yes [provider]  Vitamin D, Ergocalciferol, (DRISDOL) 50000 UNITS CAPS capsule Take 50,000 Units by mouth every Sunday.    Yes [provider]  carvedilol (COREG) 6.25 MG tablet Take 1 tablet (6.25 mg total) by mouth 2 (two) times daily with a meal. Patient not taking: Reported on 07/15/2019 05/23/16   Dessa Phi, DO    Current Medications Scheduled Meds: . Derrill Memo ON 07/16/2019] aspirin EC  81 mg Oral Daily  . [START ON 07/16/2019] atorvastatin  20 mg Oral Daily  . furosemide  20 mg Intravenous Once  . [START ON 07/16/2019] pantoprazole  40 mg Oral Q0600  . [START ON 07/16/2019] pregabalin  75 mg Oral Daily  . tiZANidine  4 mg Oral BID  . [START ON 07/20/2019] Vitamin D (Ergocalciferol)  50,000 Units Oral Q Sun   Continuous  Infusions: PRN Meds:.acetaminophen **OR** acetaminophen, albuterol, hydrALAZINE, ondansetron **OR** ondansetron (ZOFRAN) IV  CBC Recent Labs  Lab 07/15/19 1003  WBC 10.3  NEUTROABS 7.7  HGB 5.7*  HCT 19.2*  MCV 106.1*  PLT 702   Basic Metabolic Panel Recent Labs  Lab 07/15/19 1003  NA 144  K 5.3*  CL 115*  CO2 17*  GLUCOSE 114*  BUN 46*  CREATININE 6.06*  CALCIUM 8.9    Physical Exam  Blood pressure (!) 158/80, pulse 89, temperature 97.6 F (36.4 C), temperature source Oral, resp. rate (!) 21, SpO2 95 %. GEN: Elderly male, lying in bed, chronically ill-appearing ENT: NCAT EYES: EOMI, glasses on CV: Regular, normal S1 and S2 PULM: Clear bilaterally,  normal work of breathing ABD: No significant tenderness, soft SKIN: No rashes or lesions EXT: No peripheral edema  Assessment 52M with AoCKD4 (or more likely, progressive CKD5), symptomatic anemia with hematochezia  1. Renal failure, favor progressive CKD, potentially having an acute component with his severe anemia.  He does not appear overtly uremic at the current time.  Previously followed at [place] with Dr. Marval Regal not seen since 2019 2. ABLA, likely exacerbated by #1 3. LGIB, GI following 4. PVD on DAPT history of aortobifemoral bypass 5. History of CVA ago 6. History of diastolic heart failure 7. History of BPH and urethral stricture requiring dilatation, renal ultrasound pending 8. Hypertension, mildly elevated currently 9. Metabolic acidosis  Plan 1. Agree with transfusion, supportive care for the next 24 hours. 2. We will see if he improves his GFR over this timeframe 3. F/u kidney US 4. If not, or if it worsens, we will have to discuss dialysis, but I do not believe him to be a very good candidate for long-term therapy based upon my initial assessment. 5. Hopefully we will be able to involve family as well. 6. Check UA 7. Daily weights, Daily Renal Panel, Strict I/Os, Avoid nephrotoxins (NSAIDs,  judicious IV Contrast)    Rexene Agent  [phone] 07/15/2019, 4:29 PM

## 2019-07-15 NOTE — ED Triage Notes (Signed)
Pt arrives to ED from home with complaints of bright red rectal bleeding for the past two weeks. No complaints of abdominal or chest pain. Patient states he's become more weak and lethargic.

## 2019-07-15 NOTE — Treatment Plan (Signed)
Urology Treatment Plan Note:  Patient with reported history of BPH.  After reviewing the chart he does not appear to have any prior urologic intervention.  Appropriate for catheterization attempted with 18 French coud catheter by RN team  Plan: - 29 French coud catheter (ordered) - Urojet (aka viscous lidocaine per urethra) immediately PRIOR to foley placement  -When supplies are at bedside, call [facility] at [facility].  [phone] and ask charge nurse to send down a coude certified nurse to try catheter placement. Must use urojet and put penis on stretch when inserting  If still unsuccessful, please page urology on-call

## 2019-07-15 NOTE — ED Provider Notes (Signed)
[facility] EMERGENCY DEPARTMENT Provider Note   CSN: [id number] Arrival date & time: 07/15/19  5035     History Chief Complaint  Patient presents with  . Rectal Bleeding    Bobby Vega is a 85 y.o. male.  HPI   Patient presents with concern of weakness and lightheadedness in the context of ongoing rectal bleeding. Patient has multiple medical problems, though he has difficulty with recall of all of them. My chart review is clear the patient has a history of aorto femoral bypass.  He is taking Plavix, which he confirms. Over the past 2 weeks he has had bright red blood mixed in his stool, with weakness. He denies pain during this episode, including abdominal or chest discomfort.  Today, after his daughter brought him for evaluation he had 1 episode of bloody stool in the waiting room, subsequently had near syncope.  By the time of my evaluation he has no lightheadedness, but does state that he feels poorly in general.  He denies recent medication change, diet change, activity change.  Past Medical History:  Diagnosis Date  . Arthritis    hip  . Borderline diabetes   . BPH (benign prostatic hyperplasia)    takes Avodart daily  . Bronchitis    yrs ago  . Cervical spondylosis with myelopathy   . Cervical spondylosis without myelopathy   . Chronic back pain   . Constipation    Metamucil prn  . Diverticulosis   . ED (erectile dysfunction)    takes Cialis as needed as well as Viagra  . Embolism - blood clot    in stomach  . GERD (gastroesophageal reflux disease)    takes Nexium daily  . Hyperlipidemia    takes Zocor nightly  . Hypertension    takes Losartan daily  . Lumbar spondylosis with myelopathy   . Lumbar spondylosis with myelopathy   . Nocturia   . Pain in joint, hand   . Peripheral vascular disease ([place])   . Stroke ([place])   . Urinary urgency     Patient Active Problem List   Diagnosis Date Noted  . AKI (acute kidney injury) ([place])  05/22/2016  . CKD (chronic kidney disease) stage 3, GFR 30-59 ml/min 05/22/2016  . Acute encephalopathy 05/22/2016  . Acute on chronic diastolic heart failure ([place]) 05/22/2016  . Hypernatremia 05/22/2016  . Syncope 05/22/2016  . History of CVA with residual deficit   . Benign essential HTN   . Dementia without behavioral disturbance ([place])   . PVD (peripheral vascular disease) ([place])   . Diastolic dysfunction   . Urethral stricture   . Benign prostatic hyperplasia without lower urinary tract symptoms   . Leukocytosis   . Dysphagia   . Acute blood loss anemia   . Respiratory distress   . Acute on chronic renal failure ([place])   . Aspiration pneumonia of both lungs ([place]) 05/13/2016  . Acute hypoxemic respiratory failure ([place]) 05/13/2016  . Pain in limb 06/10/2014  . Numbness-Bilateral Shoulder-Hand 06/10/2014  . Hand weakness 06/04/2013  . Left arm weakness 06/04/2013  . CVA (cerebral vascular accident) ([place]) 05/17/2013  . Other and unspecified hyperlipidemia 05/16/2013  . GERD (gastroesophageal reflux disease)   . Hypertension   . Right arm weakness 05/15/2013  . Open wound of abdominal wall, lateral, without mention of complication 46/56/8127  . PVD (peripheral vascular disease) with claudication ([place]) 08/02/2011  . Atherosclerosis of native arteries of the extremities with intermittent claudication 07/10/2011  . Occlusion and  stenosis of carotid artery without mention of cerebral infarction 07/10/2011  . Peripheral vascular disease, unspecified ([place]) 06/26/2011    Past Surgical History:  Procedure Laterality Date  . ABDOMINAL AORTAGRAM N/A 07/04/2011   Procedure: ABDOMINAL Maxcine Ham;  Surgeon: Serafina Mitchell, MD;  Location: [facility] CATH LAB;  Service: Cardiovascular;  Laterality: N/A;  . AORTA - BILATERAL FEMORAL ARTERY BYPASS GRAFT  07/13/2011   Procedure: AORTA BIFEMORAL BYPASS GRAFT;  Surgeon: Theotis Burrow, MD;  Location: [place];  Service: Vascular;  Laterality: Bilateral;  .  CYSTOSCOPY  07/13/2011   Procedure: CYSTOSCOPY;  Surgeon: Hanley Ben, MD;  Location: [place];  Service: Urology;  Laterality: N/A;  cystoscopy,dilatation & insertion of councill foley catheter  . LOWER EXTREMITY ANGIOGRAM Bilateral 07/04/2011   Procedure: LOWER EXTREMITY ANGIOGRAM;  Surgeon: Serafina Mitchell, MD;  Location: [facility] CATH LAB;  Service: Cardiovascular;  Laterality: Bilateral;  . SKIN GRAFT  1983       Family History  Problem Relation Age of Onset  . Heart disease Mother        Before age 62  . Heart disease Brother        NOT  before age 78  . Heart attack Brother   . Anesthesia problems Neg Hx   . Hypotension Neg Hx   . Malignant hyperthermia Neg Hx   . Pseudochol deficiency Neg Hx     Social History   Tobacco Use  . Smoking status: Current Some Day Smoker    Years: 0.50    Types: Cigars    Last attempt to quit: 04/29/1992    Years since quitting: 27.2  . Smokeless tobacco: Never Used  Substance Use Topics  . Alcohol use: No  . Drug use: No    Types: Marijuana    Comment: Hx    Home Medications Prior to Admission medications   Medication Sig Start Date End Date Taking? Authorizing Provider  albuterol (VENTOLIN HFA) 108 (90 Base) MCG/ACT inhaler Inhale 1-2 puffs into the lungs every 4 (four) hours as needed for shortness of breath or wheezing. 05/29/19  Yes [provider]  amLODipine (NORVASC) 10 MG tablet Take 10 mg by mouth daily. 04/25/19  Yes [provider]  aspirin EC 81 MG tablet Take 81 mg by mouth daily.   Yes [provider]  atorvastatin (LIPITOR) 20 MG tablet Take 20 mg by mouth daily.   Yes [provider]  clopidogrel (PLAVIX) 75 MG tablet Take 1 tablet (75 mg total) by mouth daily with breakfast. 05/18/13  Yes Rai, Ripudeep K, MD  famotidine (PEPCID) 20 MG tablet Take 20 mg by mouth daily.   Yes [provider]  hydrALAZINE (APRESOLINE) 25 MG tablet Take 1 tablet (25 mg total) by mouth 2 (two) times  daily. 05/18/13  Yes Rai, Ripudeep K, MD  pregabalin (LYRICA) 75 MG capsule Take 75 mg by mouth daily. 03/21/19  Yes [provider]  tiZANidine (ZANAFLEX) 4 MG tablet Take 4 mg by mouth 2 (two) times daily. 07/07/19  Yes [provider]  Vitamin D, Ergocalciferol, (DRISDOL) 50000 UNITS CAPS capsule Take 50,000 Units by mouth every Sunday.    Yes [provider]  carvedilol (COREG) 6.25 MG tablet Take 1 tablet (6.25 mg total) by mouth 2 (two) times daily with a meal. Patient not taking: Reported on 07/15/2019 05/23/16   Dessa Phi, DO    Allergies    Patient has no known allergies.  Review of Systems   Review of  Systems  Constitutional:       Per HPI, otherwise negative  HENT:       Per HPI, otherwise negative  Respiratory:       Per HPI, otherwise negative  Cardiovascular:       Per HPI, otherwise negative  Gastrointestinal: Positive for blood in stool. Negative for abdominal pain and vomiting.  Endocrine:       Negative aside from HPI  Genitourinary:       Neg aside from HPI   Musculoskeletal:       Per HPI, otherwise negative  Skin: Negative.   Neurological: Positive for weakness and light-headedness. Negative for syncope.    Physical Exam Updated Vital Signs BP 132/63   Pulse 96   Temp (!) 97.5 F (36.4 C) (Oral)   Resp (!) 24   SpO2 98%   Physical Exam Vitals and nursing note reviewed.  Constitutional:      General: He is not in acute distress.    Appearance: He is well-developed.  HENT:     Head: Normocephalic and atraumatic.  Eyes:     Conjunctiva/sclera: Conjunctivae normal.  Cardiovascular:     Rate and Rhythm: Regular rhythm. Tachycardia present.  Pulmonary:     Effort: Pulmonary effort is normal. Tachypnea present. No respiratory distress.     Breath sounds: No stridor.  Abdominal:     General: There is no distension.     Tenderness: There is no abdominal tenderness. There is no guarding.  Skin:    General: Skin is warm  and dry.  Neurological:     Mental Status: He is alert and oriented to person, place, and time.   Per tech report bright red blood in the toilet bowl with a bowel movement.  Initial cardiac monitor rate 95, sinus rhythm, unremarkable  ED Results / Procedures / Treatments   Labs (all labs ordered are listed, but only abnormal results are displayed) Labs Reviewed  COMPREHENSIVE METABOLIC PANEL - Abnormal; Notable for the following components:      Result Value   Potassium 5.3 (*)    Chloride 115 (*)    CO2 17 (*)    Glucose, Bld 114 (*)    BUN 46 (*)    Creatinine, Ser 6.06 (*)    AST 13 (*)    GFR calc non Af Amer 8 (*)    GFR calc Af Amer 9 (*)    All other components within normal limits  CBC WITH DIFFERENTIAL/PLATELET - Abnormal; Notable for the following components:   RBC 1.81 (*)    Hemoglobin 5.7 (*)    HCT 19.2 (*)    MCV 106.1 (*)    MCHC 29.7 (*)    RDW 16.9 (*)    nRBC 0.6 (*)    All other components within normal limits  PROTIME-INR - Abnormal; Notable for the following components:   Prothrombin Time 16.2 (*)    INR 1.3 (*)    All other components within normal limits  RESPIRATORY PANEL BY RT PCR (FLU A&B, COVID)  LIPASE, BLOOD  TYPE AND SCREEN  PREPARE RBC (CROSSMATCH)    EKG EKG Interpretation  Date/Time:  Tuesday July 15 2019 09:25:04 EST Ventricular Rate:  79 PR Interval:    QRS Duration: 85 QT Interval:  391 QTC Calculation: 449 R Axis:   71 Text Interpretation: Sinus rhythm Probable left atrial enlargement Probable anteroseptal infarct, recent Baseline wander in lead(s) V6 Abnormal ECG Confirmed by Carmin Muskrat [phone]) on 07/15/2019 9:28:51  AM   Radiology DG Chest Port 1 View  Result Date: 07/15/2019 CLINICAL DATA:  Recent syncopal episode EXAM: PORTABLE CHEST 1 VIEW COMPARISON:  05/19/2016 FINDINGS: Cardiac shadow is mildly enlarged but accentuated by the portable technique. Small right-sided pleural effusion is noted. There is likely  underlying basilar atelectasis. No other focal infiltrate is seen. No bony abnormality is noted. IMPRESSION: Small right pleural effusion with underlying basilar atelectasis. Electronically Signed   By: Inez Catalina M.D.   On: 07/15/2019 10:54    Procedures .Critical Care Performed by: Carmin Muskrat, MD Authorized by: Carmin Muskrat, MD   Critical care provider statement:    Critical care time (minutes):  35   Critical care was time spent personally by me on the following activities:  Discussions with consultants, evaluation of patient's response to treatment, examination of patient, ordering and performing treatments and interventions, ordering and review of laboratory studies, re-evaluation of patient's condition, obtaining history from patient or surrogate, review of old charts and development of treatment plan with patient or surrogate   (including critical care time)  Medications Ordered in ED Medications  pantoprazole (PROTONIX) 80 mg in sodium chloride 0.9 % 250 mL (0.32 mg/mL) infusion (has no administration in time range)  0.9 %  sodium chloride infusion ( Intravenous New Bag/Given 07/15/19 1035)  0.9 %  sodium chloride infusion (has no administration in time range)  pantoprazole (PROTONIX) 80 mg in sodium chloride 0.9 % 100 mL IVPB (80 mg Intravenous New Bag/Given 07/15/19 1036)    ED Course  I have reviewed the triage vital signs and the nursing notes.  Pertinent labs & imaging results that were available during my care of the patient were reviewed by me and considered in my medical decision making (see chart for details).    MDM Rules/Calculators/A&P                      11:03 AM I reviewed GEN the patient is awake and alert.  Cardiac monitor 90 sinus rhythm, unremarkable  Patient is aware of findings, though it is unclear if he grasps the significance of substantial anemia.  Patient requests his daughter to be present, but due to pandemic restrictions, visitors not  currently allowed. With concern for ongoing bleeding, in the context of Plavix therapy, the patient will require transfusion.  He is agreeable to this, and it has been ordered. Given his critically abnormal hemoglobin value, as well as evidence for new renal dysfunction, he will require admission, transfusion, further management, monitoring.  I have discussed patient's case with our gastroenterology team on call, they will follow as a consulting team. Final Clinical Impression(s) / ED Diagnoses Final diagnoses:  Near syncope  Acute GI bleeding  AKI (acute kidney injury) ([place])     Carmin Muskrat, MD 07/15/19 [phone]

## 2019-07-15 NOTE — Progress Notes (Signed)
Floor nurse reported patient has not voided during ED stay, start bladder scan showed about 200 mL-Oligouria (<30 ml/HR), Foley to accurately calculate input and output.

## 2019-07-16 ENCOUNTER — Inpatient Hospital Stay (HOSPITAL_COMMUNITY): Payer: Medicare HMO

## 2019-07-16 DIAGNOSIS — R5381 Other malaise: Secondary | ICD-10-CM

## 2019-07-16 DIAGNOSIS — N35919 Unspecified urethral stricture, male, unspecified site: Secondary | ICD-10-CM

## 2019-07-16 DIAGNOSIS — N179 Acute kidney failure, unspecified: Secondary | ICD-10-CM

## 2019-07-16 DIAGNOSIS — I5022 Chronic systolic (congestive) heart failure: Secondary | ICD-10-CM

## 2019-07-16 DIAGNOSIS — I361 Nonrheumatic tricuspid (valve) insufficiency: Secondary | ICD-10-CM

## 2019-07-16 DIAGNOSIS — E872 Acidosis: Secondary | ICD-10-CM

## 2019-07-16 DIAGNOSIS — E875 Hyperkalemia: Secondary | ICD-10-CM

## 2019-07-16 DIAGNOSIS — N189 Chronic kidney disease, unspecified: Secondary | ICD-10-CM

## 2019-07-16 LAB — TYPE AND SCREEN
ABO/RH(D): O POS
Antibody Screen: NEGATIVE
Unit division: 0
Unit division: 0

## 2019-07-16 LAB — HEMOGLOBIN AND HEMATOCRIT, BLOOD
HCT: 25.1 % — ABNORMAL LOW (ref 39.0–52.0)
HCT: 26.4 % — ABNORMAL LOW (ref 39.0–52.0)
HCT: 29 % — ABNORMAL LOW (ref 39.0–52.0)
Hemoglobin: 7.9 g/dL — ABNORMAL LOW (ref 13.0–17.0)
Hemoglobin: 8.1 g/dL — ABNORMAL LOW (ref 13.0–17.0)
Hemoglobin: 9 g/dL — ABNORMAL LOW (ref 13.0–17.0)

## 2019-07-16 LAB — BASIC METABOLIC PANEL
Anion gap: 10 (ref 5–15)
BUN: 47 mg/dL — ABNORMAL HIGH (ref 8–23)
CO2: 17 mmol/L — ABNORMAL LOW (ref 22–32)
Calcium: 8.2 mg/dL — ABNORMAL LOW (ref 8.9–10.3)
Chloride: 114 mmol/L — ABNORMAL HIGH (ref 98–111)
Creatinine, Ser: 5.94 mg/dL — ABNORMAL HIGH (ref 0.61–1.24)
GFR calc Af Amer: 9 mL/min — ABNORMAL LOW (ref >60)
GFR calc non Af Amer: 8 mL/min — ABNORMAL LOW (ref >60)
Glucose, Bld: 95 mg/dL (ref 70–99)
Potassium: 5.1 mmol/L (ref 3.5–5.1)
Sodium: 141 mmol/L (ref 135–145)

## 2019-07-16 LAB — BPAM RBC
Blood Product Expiration Date: 202102212359
Blood Product Expiration Date: 202102212359
ISSUE DATE / TIME: 202101191154
ISSUE DATE / TIME: 202101191436
Unit Type and Rh: 5100
Unit Type and Rh: 5100

## 2019-07-16 LAB — ECHOCARDIOGRAM COMPLETE: Weight: 2504.43 oz

## 2019-07-16 MED ORDER — SODIUM CHLORIDE 0.9 % IV SOLN
1.0000 g | INTRAVENOUS | Status: AC
  Administered 2019-07-16 – 2019-07-18 (×3): 1 g via INTRAVENOUS
  Filled 2019-07-16: qty 10
  Filled 2019-07-16: qty 1
  Filled 2019-07-16: qty 10
  Filled 2019-07-16: qty 1

## 2019-07-16 MED ORDER — SODIUM CHLORIDE 0.9 % IV SOLN
510.0000 mg | Freq: Once | INTRAVENOUS | Status: AC
  Administered 2019-07-16: 510 mg via INTRAVENOUS
  Filled 2019-07-16: qty 17

## 2019-07-16 MED ORDER — OXYBUTYNIN CHLORIDE 5 MG PO TABS
5.0000 mg | ORAL_TABLET | Freq: Once | ORAL | Status: AC
  Administered 2019-07-16: 5 mg via ORAL
  Filled 2019-07-16: qty 1

## 2019-07-16 MED ORDER — TIZANIDINE HCL 2 MG PO TABS
2.0000 mg | ORAL_TABLET | Freq: Two times a day (BID) | ORAL | Status: DC
  Administered 2019-07-16 – 2019-07-18 (×4): 2 mg via ORAL
  Filled 2019-07-16 (×4): qty 1

## 2019-07-16 MED ORDER — CHLORHEXIDINE GLUCONATE CLOTH 2 % EX PADS
6.0000 | MEDICATED_PAD | Freq: Every day | CUTANEOUS | Status: DC
  Administered 2019-07-16 – 2019-07-23 (×8): 6 via TOPICAL

## 2019-07-16 MED ORDER — DARBEPOETIN ALFA 60 MCG/0.3ML IJ SOSY
60.0000 ug | PREFILLED_SYRINGE | Freq: Once | INTRAMUSCULAR | Status: AC
  Administered 2019-07-16: 60 ug via SUBCUTANEOUS
  Filled 2019-07-16: qty 0.3

## 2019-07-16 NOTE — Plan of Care (Signed)
  Problem: Education: Goal: Knowledge of General Education information will improve Description: Including pain rating scale, medication(s)/side effects and non-pharmacologic comfort measures Outcome: Not Progressing   Problem: Health Behavior/Discharge Planning: Goal: Ability to manage health-related needs will improve Outcome: Not Progressing   Problem: Clinical Measurements: Goal: Ability to maintain clinical measurements within normal limits will improve Outcome: Not Progressing Goal: Will remain free from infection Outcome: Progressing Note: Prophylactic IV abx therapy initiated  Goal: Diagnostic test results will improve Outcome: Progressing Goal: Respiratory complications will improve Outcome: Progressing Goal: Cardiovascular complication will be avoided Outcome: Progressing   Problem: Activity: Goal: Risk for activity intolerance will decrease Outcome: Progressing   Problem: Nutrition: Goal: Adequate nutrition will be maintained Outcome: Progressing   Problem: Coping: Goal: Level of anxiety will decrease Outcome: Progressing   Problem: Elimination: Goal: Will not experience complications related to bowel motility Outcome: Progressing Goal: Will not experience complications related to urinary retention Outcome: Progressing

## 2019-07-16 NOTE — Progress Notes (Signed)
Admit: 07/15/2019 LOS: 1  83M with AoCKD4 (or more likely, progressive CKD5), symptomatic anemia with hematochezia  Subjective:  . Req urology for foley cath . Transfused 2u, appropriate inc in Hb . Much more alert, awake today, in chair, dtr [place] visiting . SCr with some slight improvement to 5.9 . Discussed that with his age HD might be difficult, explored this and non-HD mgmt of uremia/ESRD; no decisions made . Only 0.2L UOP reported thus far  . Renal US w/o HN  01/19 0701 - 01/20 0700 In: 835.5 [P.O.:237; I.V.:83.5; Blood:315; IV Piggyback:200] Out: 200 [Urine:200]  Filed Weights   07/16/19 0440  Weight: 71 kg    Scheduled Meds: . aspirin EC  81 mg Oral Daily  . atorvastatin  20 mg Oral Daily  . Chlorhexidine Gluconate Cloth  6 each Topical Daily  . lidocaine  1 application Urethral Once  . pantoprazole  40 mg Oral Q0600  . pregabalin  75 mg Oral Daily  . sodium bicarbonate  1,300 mg Oral BID  . tiZANidine  4 mg Oral BID  . [START ON 07/20/2019] Vitamin D (Ergocalciferol)  50,000 Units Oral Q Sun   Continuous Infusions: . cefTRIAXone (ROCEPHIN)  IV 1 g (07/16/19 0145)   PRN Meds:.acetaminophen **OR** acetaminophen, albuterol, hydrALAZINE, ondansetron **OR** ondansetron (ZOFRAN) IV  Current Labs: reviewed  Results for OM, LIZOTTE (MRN [id number]) as of 07/16/2019 13:46  Ref. Range 07/15/2019 20:58  Saturation Ratios Latest Ref Range: 17.9 - 39.5 % 19  Ferritin Latest Ref Range: 24 - 336 ng/mL 24    Physical Exam:  Blood pressure (!) 144/62, pulse 82, temperature 97.6 F (36.4 C), temperature source Oral, resp. rate 20, weight 71 kg, SpO2 95 %. GEN: Elderly male, lying in bed, chronically ill-appearing ENT: NCAT EYES: EOMI, glasses on CV: Regular, normal S1 and S2 PULM: Clear bilaterally, normal work of breathing ABD: No significant tenderness, soft SKIN: No rashes or lesions EXT: No peripheral edema  A 1. Renal failure, favor progressive CKD, potentially  having an acute component with his severe anemia.  Somewhat improved over past 24h.  Not uremic and no immed indication for HD.  Exploring options of RRT and conservative care.  Previously followed at [facility] with Dr. Marval Regal not seen since 2019 2. ABLA, likely exacerbated by #1, improved; alsow ith IDA 3. LGIB, GI following 4. PVD on DAPT history of aortobifemoral bypass 5. History of CVA ago 6. History of diastolic heart failure 7. History of BPH and urethral stricture requiring dilatation, req Foley with urology 8. Hypertension, mildly elevated currently 9. Metabolic acidosis on NaHCO3  P . Cont supportive care for another 24h . Will cont to discuss mgmt of late stage CKD, potential HD Will givee ESA and IV Fe today . Daily weights, Daily Renal Panel, Strict I/Os, Avoid nephrotoxins (NSAIDs, judicious IV Contrast)   Pearson Grippe MD 07/16/2019, 1:41 PM  Recent Labs  Lab 07/15/19 1003 07/15/19 2058 07/16/19 0234  NA 144 142 141  K 5.3* 5.2* 5.1  CL 115* 115* 114*  CO2 17* 16* 17*  GLUCOSE 114* 100* 95  BUN 46* 47* 47*  CREATININE 6.06* 7.29* 5.94*  CALCIUM 8.9 8.6* 8.2*   Recent Labs  Lab 07/15/19 1003 07/15/19 1003 07/15/19 2058 07/16/19 0234 07/16/19 0738  WBC 10.3  --   --   --   --   NEUTROABS 7.7  --   --   --   --   HGB 5.7*   < > 8.4* 7.9*  8.1*  HCT 19.2*   < > 27.1* 25.1* 26.4*  MCV 106.1*  --   --   --   --   PLT 182  --   --   --   --    < > = values in this interval not displayed.

## 2019-07-16 NOTE — Consult Note (Signed)
H&P Physician requesting consult: Wynetta Fines  Chief Complaint: Difficult Foley catheter placement  History of Present Illness: 85 year old male currently admitted for rectal bleeding.  He has a reported history of BPH, stroke, peripheral vascular disease.  He is on aspirin and Plavix.  Creatinine is 7.2 from a baseline closer to 2.  Bladder scan showed greater than 450 cc.  Renal ultrasound was performed and this revealed no hydronephrosis bilaterally.  Nursing attempted Foley catheter placement and this failed.  Overnight, the resident physician on call performed urethral dilation with catheter placement for a urethral stricture.  Patient currently resting comfortably in bed.  Catheter draining well.  Past Medical History:  Diagnosis Date  . Arthritis    hip  . Borderline diabetes   . BPH (benign prostatic hyperplasia)    takes Avodart daily  . Bronchitis    yrs ago  . Cervical spondylosis with myelopathy   . Cervical spondylosis without myelopathy   . Chronic back pain   . Constipation    Metamucil prn  . Diverticulosis   . ED (erectile dysfunction)    takes Cialis as needed as well as Viagra  . Embolism - blood clot    in stomach  . GERD (gastroesophageal reflux disease)    takes Nexium daily  . Hyperlipidemia    takes Zocor nightly  . Hypertension    takes Losartan daily  . Lumbar spondylosis with myelopathy   . Nocturia   . Pain in joint, hand   . Peripheral vascular disease ([place])   . Stroke ([place])   . Urinary urgency    Past Surgical History:  Procedure Laterality Date  . ABDOMINAL AORTAGRAM N/A 07/04/2011   Procedure: ABDOMINAL Maxcine Ham;  Surgeon: Serafina Mitchell, MD;  Location: [facility] CATH LAB;  Service: Cardiovascular;  Laterality: N/A;  . AORTA - BILATERAL FEMORAL ARTERY BYPASS GRAFT  07/13/2011   Procedure: AORTA BIFEMORAL BYPASS GRAFT;  Surgeon: Theotis Burrow, MD;  Location: [place];  Service: Vascular;  Laterality: Bilateral;  . CYSTOSCOPY  07/13/2011   Procedure:  CYSTOSCOPY;  Surgeon: Hanley Ben, MD;  Location: [place];  Service: Urology;  Laterality: N/A;  cystoscopy,dilatation & insertion of councill foley catheter  . LOWER EXTREMITY ANGIOGRAM Bilateral 07/04/2011   Procedure: LOWER EXTREMITY ANGIOGRAM;  Surgeon: Serafina Mitchell, MD;  Location: [facility] CATH LAB;  Service: Cardiovascular;  Laterality: Bilateral;  . SKIN GRAFT  1983    Home Medications:  Medications Prior to Admission  Medication Sig Dispense Refill Last Dose  . albuterol (VENTOLIN HFA) 108 (90 Base) MCG/ACT inhaler Inhale 1-2 puffs into the lungs every 4 (four) hours as needed for shortness of breath or wheezing.   Past Week at Unknown time  . amLODipine (NORVASC) 10 MG tablet Take 10 mg by mouth daily.   07/15/2019 at Unknown time  . aspirin EC 81 MG tablet Take 81 mg by mouth daily.   07/15/2019 at Unknown time  . atorvastatin (LIPITOR) 20 MG tablet Take 20 mg by mouth daily.   07/15/2019 at Unknown time  . clopidogrel (PLAVIX) 75 MG tablet Take 1 tablet (75 mg total) by mouth daily with breakfast. 30 tablet 4 07/15/2019 at Unknown time  . famotidine (PEPCID) 20 MG tablet Take 20 mg by mouth daily.   07/15/2019 at Unknown time  . hydrALAZINE (APRESOLINE) 25 MG tablet Take 1 tablet (25 mg total) by mouth 2 (two) times daily. 60 tablet 3 07/15/2019 at Unknown time  . pregabalin (LYRICA) 75 MG capsule Take  75 mg by mouth daily.   07/15/2019 at Unknown time  . tiZANidine (ZANAFLEX) 4 MG tablet Take 4 mg by mouth 2 (two) times daily.   07/15/2019 at Unknown time  . Vitamin D, Ergocalciferol, (DRISDOL) 50000 UNITS CAPS capsule Take 50,000 Units by mouth every Sunday.    07/13/2019  . carvedilol (COREG) 6.25 MG tablet Take 1 tablet (6.25 mg total) by mouth 2 (two) times daily with a meal. (Patient not taking: Reported on 07/15/2019) 60 tablet 0 Not Taking at Unknown time   Allergies: No Known Allergies  Family History  Problem Relation Age of Onset  . Heart disease Mother        Before age 84  .  Heart disease Brother        NOT  before age 73  . Heart attack Brother   . Anesthesia problems Neg Hx   . Hypotension Neg Hx   . Malignant hyperthermia Neg Hx   . Pseudochol deficiency Neg Hx    Social History:  reports that he has been smoking cigars. He has smoked for the past 0.50 years. He has never used smokeless tobacco. He reports that he does not drink alcohol or use drugs.  ROS: A complete review of systems was performed.  All systems are negative except for pertinent findings as noted. ROS   Physical Exam:  Vital signs in last 24 hours: Temp:  [97.3 F (36.3 C)-98.6 F (37 C)] 97.7 F (36.5 C) (01/20 0545) Pulse Rate:  [51-96] 90 (01/19 2304) Resp:  [15-29] 20 (01/20 0545) BP: (132-161)/(60-86) 153/66 (01/20 0545) SpO2:  [93 %-98 %] 95 % (01/20 0545) Weight:  [71 kg] 71 kg (01/20 0440) General:  Alert and oriented, No acute distress HEENT: Normocephalic, atraumatic Neck: No JVD or lymphadenopathy Cardiovascular: Regular rate and rhythm Lungs: Regular rate and effort Abdomen: Soft, nontender, nondistended, no abdominal masses Back: No CVA tenderness Genitourinary: Foley catheter draining clear yellow urine Extremities: No edema   Laboratory Data:  Results for orders placed or performed during the hospital encounter of 07/15/19 (from the past 24 hour(s))  Comprehensive metabolic panel     Status: Abnormal   Collection Time: 07/15/19 10:03 AM  Result Value Ref Range   Sodium 144 135 - 145 mmol/L   Potassium 5.3 (H) 3.5 - 5.1 mmol/L   Chloride 115 (H) 98 - 111 mmol/L   CO2 17 (L) 22 - 32 mmol/L   Glucose, Bld 114 (H) 70 - 99 mg/dL   BUN 46 (H) 8 - 23 mg/dL   Creatinine, Ser 6.06 (H) 0.61 - 1.24 mg/dL   Calcium 8.9 8.9 - 10.3 mg/dL   Total Protein 6.5 6.5 - 8.1 g/dL   Albumin 3.6 3.5 - 5.0 g/dL   AST 13 (L) 15 - 41 U/L   ALT 11 0 - 44 U/L   Alkaline Phosphatase 104 38 - 126 U/L   Total Bilirubin 0.6 0.3 - 1.2 mg/dL   GFR calc non Af Amer 8 (L) >60 mL/min    GFR calc Af Amer 9 (L) >60 mL/min   Anion gap 12 5 - 15  Lipase, blood     Status: None   Collection Time: 07/15/19 10:03 AM  Result Value Ref Range   Lipase 50 11 - 51 U/L  CBC WITH DIFFERENTIAL     Status: Abnormal   Collection Time: 07/15/19 10:03 AM  Result Value Ref Range   WBC 10.3 4.0 - 10.5 K/uL   RBC 1.81 (L) 4.22 -  5.81 MIL/uL   Hemoglobin 5.7 (LL) 13.0 - 17.0 g/dL   HCT 19.2 (L) 39.0 - 52.0 %   MCV 106.1 (H) 80.0 - 100.0 fL   MCH 31.5 26.0 - 34.0 pg   MCHC 29.7 (L) 30.0 - 36.0 g/dL   RDW 16.9 (H) 11.5 - 15.5 %   Platelets 182 150 - 400 K/uL   nRBC 0.6 (H) 0.0 - 0.2 %   Neutrophils Relative % 75 %   Neutro Abs 7.7 1.7 - 7.7 K/uL   Lymphocytes Relative 17 %   Lymphs Abs 1.8 0.7 - 4.0 K/uL   Monocytes Relative 7 %   Monocytes Absolute 0.7 0.1 - 1.0 K/uL   Eosinophils Relative 0 %   Eosinophils Absolute 0.0 0.0 - 0.5 K/uL   Basophils Relative 1 %   Basophils Absolute 0.1 0.0 - 0.1 K/uL   Immature Granulocytes 0 %   Abs Immature Granulocytes 0.04 0.00 - 0.07 K/uL  Protime-INR     Status: Abnormal   Collection Time: 07/15/19 10:03 AM  Result Value Ref Range   Prothrombin Time 16.2 (H) 11.4 - 15.2 seconds   INR 1.3 (H) 0.8 - 1.2  Type and screen     Status: None (Preliminary result)   Collection Time: 07/15/19 10:11 AM  Result Value Ref Range   ABO/RH(D) O POS    Antibody Screen NEG    Sample Expiration 07/18/2019,2359    Unit Number [id number]    Blood Component Type RED CELLS,LR    Unit division 00    Status of Unit ISSUED    Transfusion Status OK TO TRANSFUSE    Crossmatch Result Compatible    Unit Number [id number]    Blood Component Type RED CELLS,LR    Unit division 00    Status of Unit ISSUED    Transfusion Status OK TO TRANSFUSE    Crossmatch Result      Compatible Performed at [facility], [address], [place], [place] [address]   Respiratory Panel by RT PCR (Flu A&B, Covid) - Nasopharyngeal Swab     Status: None   Collection  Time: 07/15/19 10:38 AM   Specimen: Nasopharyngeal Swab  Result Value Ref Range   SARS Coronavirus 2 by RT PCR NEGATIVE NEGATIVE   Influenza A by PCR NEGATIVE NEGATIVE   Influenza B by PCR NEGATIVE NEGATIVE  Prepare RBC     Status: None   Collection Time: 07/15/19 10:55 AM  Result Value Ref Range   Order Confirmation      ORDER PROCESSED BY BLOOD BANK Performed at [facility], [place] [address], [place], [place] [address]   Vitamin B12     Status: None   Collection Time: 07/15/19  8:58 PM  Result Value Ref Range   Vitamin B-12 262 180 - 914 pg/mL  Folate     Status: None   Collection Time: 07/15/19  8:58 PM  Result Value Ref Range   Folate 7.3 >5.9 ng/mL  Iron and TIBC     Status: None   Collection Time: 07/15/19  8:58 PM  Result Value Ref Range   Iron 61 45 - 182 ug/dL   TIBC 315 250 - 450 ug/dL   Saturation Ratios 19 17.9 - 39.5 %   UIBC 254 ug/dL  Ferritin     Status: None   Collection Time: 07/15/19  8:58 PM  Result Value Ref Range   Ferritin 24 24 - 336 ng/mL  Reticulocytes  Status: Abnormal   Collection Time: 07/15/19  8:58 PM  Result Value Ref Range   Retic Ct Pct 2.7 0.4 - 3.1 %   RBC. 3.00 (L) 4.22 - 5.81 MIL/uL   Retic Count, Absolute 79.8 19.0 - 186.0 K/uL   Immature Retic Fract 36.7 (H) 2.3 - 15.9 %  Hemoglobin and hematocrit, blood     Status: Abnormal   Collection Time: 07/15/19  8:58 PM  Result Value Ref Range   Hemoglobin 8.4 (L) 13.0 - 17.0 g/dL   HCT 27.1 (L) 39.0 - 37.6 %  Basic metabolic panel     Status: Abnormal   Collection Time: 07/15/19  8:58 PM  Result Value Ref Range   Sodium 142 135 - 145 mmol/L   Potassium 5.2 (H) 3.5 - 5.1 mmol/L   Chloride 115 (H) 98 - 111 mmol/L   CO2 16 (L) 22 - 32 mmol/L   Glucose, Bld 100 (H) 70 - 99 mg/dL   BUN 47 (H) 8 - 23 mg/dL   Creatinine, Ser 7.29 (H) 0.61 - 1.24 mg/dL   Calcium 8.6 (L) 8.9 - 10.3 mg/dL   GFR calc non Af Amer 6 (L) >60 mL/min   GFR calc Af Amer 7 (L) >60 mL/min   Anion gap 11 5  - 15  Hemoglobin and hematocrit, blood     Status: Abnormal   Collection Time: 07/16/19  2:34 AM  Result Value Ref Range   Hemoglobin 7.9 (L) 13.0 - 17.0 g/dL   HCT 25.1 (L) 39.0 - 28.3 %  Basic metabolic panel     Status: Abnormal   Collection Time: 07/16/19  2:34 AM  Result Value Ref Range   Sodium 141 135 - 145 mmol/L   Potassium 5.1 3.5 - 5.1 mmol/L   Chloride 114 (H) 98 - 111 mmol/L   CO2 17 (L) 22 - 32 mmol/L   Glucose, Bld 95 70 - 99 mg/dL   BUN 47 (H) 8 - 23 mg/dL   Creatinine, Ser 5.94 (H) 0.61 - 1.24 mg/dL   Calcium 8.2 (L) 8.9 - 10.3 mg/dL   GFR calc non Af Amer 8 (L) >60 mL/min   GFR calc Af Amer 9 (L) >60 mL/min   Anion gap 10 5 - 15   Recent Results (from the past 240 hour(s))  Respiratory Panel by RT PCR (Flu A&B, Covid) - Nasopharyngeal Swab     Status: None   Collection Time: 07/15/19 10:38 AM   Specimen: Nasopharyngeal Swab  Result Value Ref Range Status   SARS Coronavirus 2 by RT PCR NEGATIVE NEGATIVE Final    Comment: (NOTE) SARS-CoV-2 target nucleic acids are NOT DETECTED. The SARS-CoV-2 RNA is generally detectable in upper respiratoy specimens during the acute phase of infection. The lowest concentration of SARS-CoV-2 viral copies this assay can detect is 131 copies/mL. A negative result does not preclude SARS-Cov-2 infection and should not be used as the sole basis for treatment or other patient management decisions. A negative result may occur with  improper specimen collection/handling, submission of specimen other than nasopharyngeal swab, presence of viral mutation(s) within the areas targeted by this assay, and inadequate number of viral copies (<131 copies/mL). A negative result must be combined with clinical observations, patient history, and epidemiological information. The expected result is Negative. Fact Sheet for Patients:  PinkCheek.be Fact Sheet for Healthcare Providers:   GravelBags.it This test is not yet ap proved or cleared by the [place] FDA and  has been authorized for detection  and/or diagnosis of SARS-CoV-2 by FDA under an Emergency Use Authorization (EUA). This EUA will remain  in effect (meaning this test can be used) for the duration of the COVID-19 declaration under Section 564(b)(1) of the Act, 21 U.S.C. section 360bbb-3(b)(1), unless the authorization is terminated or revoked sooner.    Influenza A by PCR NEGATIVE NEGATIVE Final   Influenza B by PCR NEGATIVE NEGATIVE Final    Comment: (NOTE) The Xpert Xpress SARS-CoV-2/FLU/RSV assay is intended as an aid in  the diagnosis of influenza from Nasopharyngeal swab specimens and  should not be used as a sole basis for treatment. Nasal washings and  aspirates are unacceptable for Xpert Xpress SARS-CoV-2/FLU/RSV  testing. Fact Sheet for Patients: PinkCheek.be Fact Sheet for Healthcare Providers: GravelBags.it This test is not yet approved or cleared by the [place] FDA and  has been authorized for detection and/or diagnosis of SARS-CoV-2 by  FDA under an Emergency Use Authorization (EUA). This EUA will remain  in effect (meaning this test can be used) for the duration of the  Covid-19 declaration under Section 564(b)(1) of the Act, 21  U.S.C. section 360bbb-3(b)(1), unless the authorization is  terminated or revoked. Performed at [facility], [place] [address], [place], [place] [address]    Creatinine: Recent Labs    07/15/19 1003 07/15/19 2058 07/16/19 0234  CREATININE 6.06* 7.29* 5.94*   Renal ultrasound personally reviewed and is detailed in history of present illness  Impression/Assessment:  Bulbar urethral stricture Urinary retention Acute renal insufficiency on top of CKD stage III  Plan:  Maintain Foley catheter for at least 1 week.  Agree with prophylactic antibiotic given  multiple instrumentation.  Remove Foley once he is more ambulatory and creatinine improved and improved from a medical standpoint.  If this occurs before 1 week, do not remove it.  If he is discharged before 1 week, we can arrange for follow-up.  Please call before discharge if this occurs.  Marton Redwood, III 07/16/2019, 7:03 AM

## 2019-07-16 NOTE — Progress Notes (Addendum)
Daily Rounding Note  07/16/2019, 8:45 AM  LOS: 1 day   SUBJECTIVE:   Chief complaint: Hematochezia without abdominal pain.  Severe anemia.     No stools or bleeding overnight or this morning. Note intermittent spells of bradycardia into 30s, rebounding to 70s and 80s.  No hypotension.  Patient says he feels weak but not dizzy.  Has not noticed any irregular heartbeat nor chest pain.  OBJECTIVE:         Vital signs in last 24 hours:    Temp:  [97.3 F (36.3 C)-98.6 F (37 C)] 97.6 F (36.4 C) (01/20 0729) Pulse Rate:  [51-96] 82 (01/20 0729) Resp:  [15-29] 20 (01/20 0545) BP: (132-161)/(60-86) 144/62 (01/20 0729) SpO2:  [93 %-98 %] 95 % (01/20 0729) Weight:  [71 kg] 71 kg (01/20 0440) Last BM Date: 07/15/19 Filed Weights   07/16/19 0440  Weight: 71 kg   General: Frail, comfortable, aged Heart: Irregularly irregular.  Rate in the 60s. Chest: Diminished overall but clear.  No cough.  No labored breathing. Abdomen: Soft.  Not tender or distended.  Active bowel sounds. Extremities: No CCE. Neuro/Psych: Alert.  Follows commands.  Moves all 4 limbs.  Right upper extremity weakness.  Intake/Output from previous day: 01/19 0701 - 01/20 0700 In: 835.5 [P.O.:237; I.V.:83.5; Blood:315; IV Piggyback:200] Out: 200 [Urine:200]  Intake/Output this shift: No intake/output data recorded.  Lab Results: Recent Labs    07/15/19 1003 07/15/19 1003 07/15/19 2058 07/16/19 0234 07/16/19 0738  WBC 10.3  --   --   --   --   HGB 5.7*   < > 8.4* 7.9* 8.1*  HCT 19.2*   < > 27.1* 25.1* 26.4*  PLT 182  --   --   --   --    < > = values in this interval not displayed.   BMET Recent Labs    07/15/19 1003 07/15/19 2058 07/16/19 0234  NA 144 142 141  K 5.3* 5.2* 5.1  CL 115* 115* 114*  CO2 17* 16* 17*  GLUCOSE 114* 100* 95  BUN 46* 47* 47*  CREATININE 6.06* 7.29* 5.94*  CALCIUM 8.9 8.6* 8.2*   LFT Recent Labs   07/15/19 1003  PROT 6.5  ALBUMIN 3.6  AST 13*  ALT 11  ALKPHOS 104  BILITOT 0.6   PT/INR Recent Labs    07/15/19 1003  LABPROT 16.2*  INR 1.3*   Hepatitis Panel No results for input(s): HEPBSAG, HCVAB, HEPAIGM, HEPBIGM in the last 72 hours.  Studies/Results: US RENAL  Result Date: 07/15/2019 CLINICAL DATA:  Acute kidney injury. EXAM: RENAL / URINARY TRACT ULTRASOUND COMPLETE COMPARISON:  March 22, 2017. FINDINGS: Right Kidney: Renal measurements: 10.7 x 5.3 x 4.2 cm = volume: 123 mL. Increased echogenicity of renal parenchyma is noted. Two simple cysts are noted, the largest measuring 1.8 cm. No mass or hydronephrosis visualized. Left Kidney: Renal measurements: 8.2 x 4.5 x 4.0 cm = volume: 76 mL. Increased echogenicity of renal parenchyma is noted. 1.2 cm simple cyst is noted. No mass or hydronephrosis visualized. Bladder: Appears normal for degree of bladder distention. Other: None. IMPRESSION: Increased echogenicity of renal parenchyma is noted bilaterally suggesting medical renal disease. Small bilateral renal cysts are noted. No hydronephrosis or renal obstruction is noted. Electronically Signed   By: Marijo Conception M.D.   On: 07/15/2019 16:23   DG Chest Port 1 View  Result Date: 07/15/2019 CLINICAL DATA:  Recent syncopal  episode EXAM: PORTABLE CHEST 1 VIEW COMPARISON:  05/19/2016 FINDINGS: Cardiac shadow is mildly enlarged but accentuated by the portable technique. Small right-sided pleural effusion is noted. There is likely underlying basilar atelectasis. No other focal infiltrate is seen. No bony abnormality is noted. IMPRESSION: Small right pleural effusion with underlying basilar atelectasis. Electronically Signed   By: Inez Catalina M.D.   On: 07/15/2019 10:54    ASSESMENT:   *  Severe anemia, macrocytosis.  Ferritin 24, iron 61.  B12 and folate wnl but labs drawn after PRBCs.    Hgb 5.7 >> 2 PRBCs >> 8.1.    *   Rectal bleeding x 2 weeks PTA.    *    Advance CKD,  stage 5.  certainly contributing to anemia.    *   BPH with bladder outlet obstruction.  Urology required to place foley yesterday. Hx urethral stricture.  Urology ordered 3 d of Rocephin.    *   Hyperkalemia. K now 5.1, upper limits normal.    *   Chronic Plavix for hx CVA.  Last dose 1/19 AM, on hold.    *   Episodic bradycardia.     PLAN   *   Colonoscopy/possible EGD after Plavix washout.      Azucena Freed  07/16/2019, 8:45 AM Phone [phone]  Will consider colonoscopy and EGD before Plavix washout Discuss tomorrow  Gatha Mayer, MD, [facility] 07/16/2019 5:53 PM

## 2019-07-16 NOTE — Progress Notes (Signed)
  Echocardiogram 2D Echocardiogram has been performed.  Bobby Vega 07/16/2019, 10:51 AM

## 2019-07-16 NOTE — Progress Notes (Signed)
PROGRESS NOTE  Bobby Vega KGM:[id number] DOB: 31-May-1935   PCP: Lin Landsman, MD  Patient is from: home  DOA: 07/15/2019 LOS: 1  Brief Narrative / Interim history: 85 year old male with history of HTN, CVA, PVD with aortofemoral bypass on DAPT and BPH presenting with rectal bleed/hematochezia with progressive weakness for about 2 weeks.  Had presyncope on the day of presentation.  In ED, COVID-19 and influenza PCR negative.  RR 24.  HDS.  98% on RA.  Hgb 5.2.  MCV 106.  K5.3. Cr 6.06.  BUN 46.  Bicarb 17.  Anemia panel normal.  CXR not impressive.  Two units of pRBC ordered.  GI consulted.  Per admitting provider, plan for colonoscopy after Plavix washout.  Nephrology consulted for AKI/CKD.  Patient had urethral dilation and Foley catheter placement by urology overnight.  Subjective: Had Foley catheter placed by urology last night.  No complaint this morning.  He is somewhat drowsy but oriented to self and place.  Denies chest pain, dyspnea or abdominal pain.  Objective: Vitals:   07/15/19 2304 07/16/19 0440 07/16/19 0545 07/16/19 0729  BP: 133/75  (!) 153/66 (!) 144/62  Pulse: 90   82  Resp:   20   Temp: 97.7 F (36.5 C)  97.7 F (36.5 C) 97.6 F (36.4 C)  TempSrc: Oral  Oral Oral  SpO2: 94%  95% 95%  Weight:  71 kg      Intake/Output Summary (Last 24 hours) at 07/16/2019 1541 Last data filed at 07/16/2019 0549 Gross per 24 hour  Intake 420.47 ml  Output 200 ml  Net 220.47 ml   Filed Weights   07/16/19 0440  Weight: 71 kg    Examination:  GENERAL: Sitting on bedside chair.  Somewhat drowsy. HEENT: MMM.  Vision and hearing grossly intact.  NECK: Supple.  No apparent JVD.  RESP:  No IWOB. Good air movement bilaterally. CVS:  RRR. Heart sounds normal.  ABD/GI/GU: Bowel sounds present. Soft. Non tender.  Foley catheter in place. MSK/EXT:  Moves extremities. No apparent deformity. No edema.  SKIN: no apparent skin lesion or wound NEURO: Somewhat sleepy but wakes to  voice.  Sitting on bedside chair.  No apparent focal neuro deficit. PSYCH: Calm. Normal affect.   Procedures:  1/19-indwelling Foley catheter by urology.  Assessment & Plan: Acute blood loss anemia due to GI bleed: presents with painless hematochezia concerning for diverticular bleed.  Anemia panel normal.  Hemodynamically stable now. -Hgb 11.8 (04/2016)> 5.7 (admit)>2u> 8.4> 8.1 -IV iron per nephrology. -Monitor H&H. -GI planning EGD/colonoscopy after Plavix washout.  Acute on CKD-4: Cr 2.51 (04/2016)> 6.06 (admit)> 7.29> 5.94.  Renal ultrasound consistent with CKD but no hydro or renal obstruction. -Nephrology managing. -Continue monitoring -Avoid nephrotoxic meds.  BPH/urethral stricture: Foley catheter placed by urology on 1/19. -Plan to continue Foley catheter for 1 week from 1/19 -Continue CTX prophylactically.  Presyncope: Likely due to #1.  Echo as below. -Monitor anemia as above  Acute systolic CHF: Echo with EF of 40 to 45%, G2DD, RWMA, mod LAE and RVSP to 47.  Not on diuretics at home.  Appears euvolemic on exam. -Continue monitoring fluid status -May consider involving cardiology once renal function improves.  History of CVA: Stable. -Continue home aspirin and a statin.  History of PVD with or for femoral bypass on DAPT -Plavix on hold for GI work-up -Continue aspirin and statin.  Hyperkalemia: Likely due to renal failure.  Resolved.  Acute metabolic acidosis: Likely due to renal failure.  Stable. -  Per nephrology  Debility/physical deconditioning -PT/OT eval               DVT prophylaxis: SCD in setting of GI bleed Code Status: Full code Family Communication: Patient and/or RN. Available if any question. Disposition Plan: Remains inpatient due to GI bleed and AKI Consultants: GI, nephrology, urology   Microbiology summarized: 1/19-influenza PCR negative. 1/19-COVID-19 negative.  Sch Meds:  Scheduled Meds: . aspirin EC  81 mg Oral Daily  .  atorvastatin  20 mg Oral Daily  . Chlorhexidine Gluconate Cloth  6 each Topical Daily  . darbepoetin (ARANESP) injection - NON-DIALYSIS  60 mcg Subcutaneous Once  . lidocaine  1 application Urethral Once  . pantoprazole  40 mg Oral Q0600  . pregabalin  75 mg Oral Daily  . sodium bicarbonate  1,300 mg Oral BID  . tiZANidine  4 mg Oral BID  . [START ON 07/20/2019] Vitamin D (Ergocalciferol)  50,000 Units Oral Q Sun   Continuous Infusions: . cefTRIAXone (ROCEPHIN)  IV 1 g (07/16/19 0145)  . ferumoxytol     PRN Meds:.acetaminophen **OR** acetaminophen, albuterol, hydrALAZINE, ondansetron **OR** ondansetron (ZOFRAN) IV  Antimicrobials: Anti-infectives (From admission, onward)   Start     Dose/Rate Route Frequency Ordered Stop   07/16/19 0100  cefTRIAXone (ROCEPHIN) 1 g in sodium chloride 0.9 % 100 mL IVPB     1 g 200 mL/hr over 30 Minutes Intravenous Every 24 hours 07/16/19 0048 07/19/19 0059       I have personally reviewed the following labs and images: CBC: Recent Labs  Lab 07/15/19 1003 07/15/19 2058 07/16/19 0234 07/16/19 0738  WBC 10.3  --   --   --   NEUTROABS 7.7  --   --   --   HGB 5.7* 8.4* 7.9* 8.1*  HCT 19.2* 27.1* 25.1* 26.4*  MCV 106.1*  --   --   --   PLT 182  --   --   --    BMP &GFR Recent Labs  Lab 07/15/19 1003 07/15/19 2058 07/16/19 0234  NA 144 142 141  K 5.3* 5.2* 5.1  CL 115* 115* 114*  CO2 17* 16* 17*  GLUCOSE 114* 100* 95  BUN 46* 47* 47*  CREATININE 6.06* 7.29* 5.94*  CALCIUM 8.9 8.6* 8.2*   CrCl cannot be calculated (Unknown ideal weight.). Liver & Pancreas: Recent Labs  Lab 07/15/19 1003  AST 13*  ALT 11  ALKPHOS 104  BILITOT 0.6  PROT 6.5  ALBUMIN 3.6   Recent Labs  Lab 07/15/19 1003  LIPASE 50   No results for input(s): AMMONIA in the last 168 hours. Diabetic: No results for input(s): HGBA1C in the last 72 hours. No results for input(s): GLUCAP in the last 168 hours. Cardiac Enzymes: No results for input(s):  CKTOTAL, CKMB, CKMBINDEX, TROPONINI in the last 168 hours. No results for input(s): PROBNP in the last 8760 hours. Coagulation Profile: Recent Labs  Lab 07/15/19 1003  INR 1.3*   Thyroid Function Tests: No results for input(s): TSH, T4TOTAL, FREET4, T3FREE, THYROIDAB in the last 72 hours. Lipid Profile: No results for input(s): CHOL, HDL, LDLCALC, TRIG, CHOLHDL, LDLDIRECT in the last 72 hours. Anemia Panel: Recent Labs    07/15/19 2058  VITAMINB12 262  FOLATE 7.3  FERRITIN 24  TIBC 315  IRON 61  RETICCTPCT 2.7   Urine analysis:    Component Value Date/Time   COLORURINE YELLOW 05/17/2013 0530   APPEARANCEUR CLOUDY (A) 05/17/2013 0530   LABSPEC 1.012 05/17/2013  0530   PHURINE 5.0 05/17/2013 0530   GLUCOSEU NEGATIVE 05/17/2013 0530   HGBUR NEGATIVE 05/17/2013 0530   BILIRUBINUR NEGATIVE 05/17/2013 0530   KETONESUR NEGATIVE 05/17/2013 0530   PROTEINUR 30 (A) 05/17/2013 0530   UROBILINOGEN 0.2 05/17/2013 0530   NITRITE NEGATIVE 05/17/2013 0530   LEUKOCYTESUR MODERATE (A) 05/17/2013 0530   Sepsis Labs: Invalid input(s): PROCALCITONIN, [place]  Microbiology: Recent Results (from the past 240 hour(s))  Respiratory Panel by RT PCR (Flu A&B, Covid) - Nasopharyngeal Swab     Status: None   Collection Time: 07/15/19 10:38 AM   Specimen: Nasopharyngeal Swab  Result Value Ref Range Status   SARS Coronavirus 2 by RT PCR NEGATIVE NEGATIVE Final    Comment: (NOTE) SARS-CoV-2 target nucleic acids are NOT DETECTED. The SARS-CoV-2 RNA is generally detectable in upper respiratoy specimens during the acute phase of infection. The lowest concentration of SARS-CoV-2 viral copies this assay can detect is 131 copies/mL. A negative result does not preclude SARS-Cov-2 infection and should not be used as the sole basis for treatment or other patient management decisions. A negative result may occur with  improper specimen collection/handling, submission of specimen other than  nasopharyngeal swab, presence of viral mutation(s) within the areas targeted by this assay, and inadequate number of viral copies (<131 copies/mL). A negative result must be combined with clinical observations, patient history, and epidemiological information. The expected result is Negative. Fact Sheet for Patients:  PinkCheek.be Fact Sheet for Healthcare Providers:  GravelBags.it This test is not yet ap proved or cleared by the [place] FDA and  has been authorized for detection and/or diagnosis of SARS-CoV-2 by FDA under an Emergency Use Authorization (EUA). This EUA will remain  in effect (meaning this test can be used) for the duration of the COVID-19 declaration under Section 564(b)(1) of the Act, 21 U.S.C. section 360bbb-3(b)(1), unless the authorization is terminated or revoked sooner.    Influenza A by PCR NEGATIVE NEGATIVE Final   Influenza B by PCR NEGATIVE NEGATIVE Final    Comment: (NOTE) The Xpert Xpress SARS-CoV-2/FLU/RSV assay is intended as an aid in  the diagnosis of influenza from Nasopharyngeal swab specimens and  should not be used as a sole basis for treatment. Nasal washings and  aspirates are unacceptable for Xpert Xpress SARS-CoV-2/FLU/RSV  testing. Fact Sheet for Patients: PinkCheek.be Fact Sheet for Healthcare Providers: GravelBags.it This test is not yet approved or cleared by the [place] FDA and  has been authorized for detection and/or diagnosis of SARS-CoV-2 by  FDA under an Emergency Use Authorization (EUA). This EUA will remain  in effect (meaning this test can be used) for the duration of the  Covid-19 declaration under Section 564(b)(1) of the Act, 21  U.S.C. section 360bbb-3(b)(1), unless the authorization is  terminated or revoked. Performed at [facility], [place] [address] [place], [place] [address]      Radiology Studies: US RENAL  Result Date: 07/15/2019 CLINICAL DATA:  Acute kidney injury. EXAM: RENAL / URINARY TRACT ULTRASOUND COMPLETE COMPARISON:  March 22, 2017. FINDINGS: Right Kidney: Renal measurements: 10.7 x 5.3 x 4.2 cm = volume: 123 mL. Increased echogenicity of renal parenchyma is noted. Two simple cysts are noted, the largest measuring 1.8 cm. No mass or hydronephrosis visualized. Left Kidney: Renal measurements: 8.2 x 4.5 x 4.0 cm = volume: 76 mL. Increased echogenicity of renal parenchyma is noted. 1.2 cm simple cyst is noted. No mass or hydronephrosis visualized. Bladder: Appears normal for degree of bladder  distention. Other: None. IMPRESSION: Increased echogenicity of renal parenchyma is noted bilaterally suggesting medical renal disease. Small bilateral renal cysts are noted. No hydronephrosis or renal obstruction is noted. Electronically Signed   By: Marijo Conception M.D.   On: 07/15/2019 16:23   ECHOCARDIOGRAM COMPLETE  Result Date: 07/16/2019   ECHOCARDIOGRAM REPORT   Patient Name:   Bobby Vega Date of Exam: 07/16/2019 Medical Rec #:  [id number]      Height:       65.0 in Accession #:    [id number]     Weight:       156.5 lb Date of Birth:  03-22-1935      BSA:          1.78 m Patient Age:    83 years       BP:           144/62 mmHg Patient Gender: M              HR:           82 bpm. Exam Location:  Inpatient Procedure: 2D Echo Indications:    Dyspnea 786.09 / R06.00  History:        Patient has prior history of Echocardiogram examinations, most                 recent 05/16/2016. CHF, Stroke; Risk Factors:Hypertension. AKI                 (acute kidney injury)                 CKD (chronic kidney disease).  Sonographer:    Vikki Ports Turrentine Referring Phys: [id number] [place]  1. Left ventricular ejection fraction, by visual estimation, is 40 to 45%. The left ventricle has mild to moderately decreased function. There is no left ventricular hypertrophy.  2. Basal  inferolateral segment is abnormal.  3. Left ventricular diastolic parameters are consistent with Grade II diastolic dysfunction (pseudonormalization).  4. The left ventricle demonstrates regional wall motion abnormalities.  5. Global right ventricle has normal systolic function.The right ventricular size is normal. No increase in right ventricular wall thickness.  6. Left atrial size was moderately dilated.  7. Right atrial size was mildly dilated.  8. Moderate pleural effusion in the left lateral region.  9. The mitral valve is normal in structure. Mild mitral valve regurgitation. No evidence of mitral stenosis. 10. The tricuspid valve is normal in structure. 11. The tricuspid valve is normal in structure. Tricuspid valve regurgitation is mild. 12. The aortic valve is normal in structure. Aortic valve regurgitation is not visualized. No evidence of aortic valve sclerosis or stenosis. 13. The pulmonic valve was normal in structure. Pulmonic valve regurgitation is not visualized. 14. Moderately elevated pulmonary artery systolic pressure. 15. The tricuspid regurgitant velocity is 2.84 m/s, and with an assumed right atrial pressure of 15 mmHg, the estimated right ventricular systolic pressure is moderately elevated at 47.3 mmHg. 16. The inferior vena cava is dilated in size with <50% respiratory variability, suggesting right atrial pressure of 15 mmHg. FINDINGS  Left Ventricle: Left ventricular ejection fraction, by visual estimation, is 40 to 45%. The left ventricle has mild to moderately decreased function. The left ventricle demonstrates regional wall motion abnormalities. There is no left ventricular hypertrophy. Left ventricular diastolic parameters are consistent with Grade II diastolic dysfunction (pseudonormalization). Normal left atrial pressure.  LV Wall Scoring: The basal inferolateral segment is akinetic. Right Ventricle: The right ventricular size  is normal. No increase in right ventricular wall  thickness. Global RV systolic function is has normal systolic function. The tricuspid regurgitant velocity is 2.84 m/s, and with an assumed right atrial pressure  of 15 mmHg, the estimated right ventricular systolic pressure is moderately elevated at 47.3 mmHg. Left Atrium: Left atrial size was moderately dilated. Right Atrium: Right atrial size was mildly dilated Pericardium: There is no evidence of pericardial effusion. There is a moderate pleural effusion in the left lateral region. Mitral Valve: The mitral valve is normal in structure. Mild mitral valve regurgitation. No evidence of mitral valve stenosis by observation. Tricuspid Valve: The tricuspid valve is normal in structure. Tricuspid valve regurgitation is mild. Aortic Valve: The aortic valve is normal in structure. Aortic valve regurgitation is not visualized. The aortic valve is structurally normal, with no evidence of sclerosis or stenosis. Pulmonic Valve: The pulmonic valve was normal in structure. Pulmonic valve regurgitation is not visualized. Pulmonic regurgitation is not visualized. Aorta: The aortic root, ascending aorta and aortic arch are all structurally normal, with no evidence of dilitation or obstruction. Venous: The inferior vena cava is dilated in size with less than 50% respiratory variability, suggesting right atrial pressure of 15 mmHg. IAS/Shunts: No atrial level shunt detected by color flow Doppler. There is no evidence of a patent foramen ovale. No ventricular septal defect is seen or detected. There is no evidence of an atrial septal defect.  LEFT VENTRICLE PLAX 2D LVIDd:         4.00 cm  Diastology LVIDs:         3.30 cm  LV e' lateral:   5.33 cm/s LV PW:         1.00 cm  LV E/e' lateral: 19.5 LV IVS:        1.00 cm  LV e' medial:    3.53 cm/s LVOT diam:     2.10 cm  LV E/e' medial:  29.5 LV SV:         26 ml LV SV Index:   14.22 LVOT Area:     3.46 cm  RIGHT VENTRICLE RV S prime:     12.20 cm/s TAPSE (M-mode): 2.0 cm LEFT ATRIUM              Index       RIGHT ATRIUM           Index LA diam:        3.80 cm 2.13 cm/m  RA Area:     21.00 cm LA Vol (A2C):   64.3 ml 36.07 ml/m RA Volume:   66.30 ml  37.20 ml/m LA Vol (A4C):   88.5 ml 49.65 ml/m LA Biplane Vol: 82.2 ml 46.12 ml/m  AORTIC VALVE LVOT Vmax:   60.10 cm/s LVOT Vmean:  41.400 cm/s LVOT VTI:    0.106 m  AORTA Ao Root diam: 2.80 cm MITRAL VALVE                         TRICUSPID VALVE MV Area (PHT): 3.68 cm              TR Peak grad:   32.3 mmHg MV PHT:        59.74 msec            TR Vmax:        284.00 cm/s MV Decel Time: 206 msec MV E velocity: 104.00 cm/s 103 cm/s  SHUNTS MV A velocity: 60.00 cm/s  70.3 cm/s Systemic VTI:  0.11 m MV E/A ratio:  1.73        1.5       Systemic Diam: 2.10 cm  Candee Furbish MD Electronically signed by Candee Furbish MD Signature Date/Time: 07/16/2019/1:55:27 PM    Final     35 minutes with more than 50% spent in reviewing records, counseling patient/family and coordinating care.   Ledon Weihe T. [place]  If 7PM-7AM, please contact night-coverage [email or web address] Password [facility] 07/16/2019, 3:41 PM

## 2019-07-17 DIAGNOSIS — E870 Hyperosmolality and hypernatremia: Secondary | ICD-10-CM

## 2019-07-17 LAB — CBC
HCT: 24.9 % — ABNORMAL LOW (ref 39.0–52.0)
Hemoglobin: 7.5 g/dL — ABNORMAL LOW (ref 13.0–17.0)
MCH: 27.8 pg (ref 26.0–34.0)
MCHC: 30.1 g/dL (ref 30.0–36.0)
MCV: 92.2 fL (ref 80.0–100.0)
Platelets: 114 10*3/uL — ABNORMAL LOW (ref 150–400)
RBC: 2.7 MIL/uL — ABNORMAL LOW (ref 4.22–5.81)
RDW: 19.9 % — ABNORMAL HIGH (ref 11.5–15.5)
WBC: 12.3 10*3/uL — ABNORMAL HIGH (ref 4.0–10.5)
nRBC: 0.7 % — ABNORMAL HIGH (ref 0.0–0.2)

## 2019-07-17 LAB — RENAL FUNCTION PANEL
Albumin: 3.1 g/dL — ABNORMAL LOW (ref 3.5–5.0)
Anion gap: 12 (ref 5–15)
BUN: 48 mg/dL — ABNORMAL HIGH (ref 8–23)
CO2: 19 mmol/L — ABNORMAL LOW (ref 22–32)
Calcium: 8.4 mg/dL — ABNORMAL LOW (ref 8.9–10.3)
Chloride: 115 mmol/L — ABNORMAL HIGH (ref 98–111)
Creatinine, Ser: 5.76 mg/dL — ABNORMAL HIGH (ref 0.61–1.24)
GFR calc Af Amer: 10 mL/min — ABNORMAL LOW (ref >60)
GFR calc non Af Amer: 8 mL/min — ABNORMAL LOW (ref >60)
Glucose, Bld: 94 mg/dL (ref 70–99)
Phosphorus: 5.5 mg/dL — ABNORMAL HIGH (ref 2.5–4.6)
Potassium: 4.9 mmol/L (ref 3.5–5.1)
Sodium: 146 mmol/L — ABNORMAL HIGH (ref 135–145)

## 2019-07-17 LAB — HEMOGLOBIN AND HEMATOCRIT, BLOOD
HCT: 23.3 % — ABNORMAL LOW (ref 39.0–52.0)
Hemoglobin: 7.4 g/dL — ABNORMAL LOW (ref 13.0–17.0)

## 2019-07-17 LAB — MAGNESIUM: Magnesium: 2.4 mg/dL (ref 1.7–2.4)

## 2019-07-17 MED ORDER — DEXTROSE 5 % IV SOLN: INTRAVENOUS | Status: DC

## 2019-07-17 NOTE — Progress Notes (Signed)
PROGRESS NOTE  Bobby Vega TKZ:[id number] DOB: April 01, 1935   PCP: Lin Landsman, MD  Patient is from: home  DOA: 07/15/2019 LOS: 2  Brief Narrative / Interim history: 85 year old male with history of HTN, CVA, PVD with aortofemoral bypass on DAPT and BPH presenting with rectal bleed/hematochezia with progressive weakness for about 2 weeks.  Had presyncope on the day of presentation.  In ED, COVID-19 and influenza PCR negative.  RR 24.  HDS.  98% on RA.  Hgb 5.2.  MCV 106.  K5.3. Cr 6.06.  BUN 46.  Bicarb 17.  Anemia panel normal.  CXR not impressive.  Two units of pRBC ordered.  GI consulted for GI bleed.  Nephrology consulted for AKI/CKD.  Patient had urethral dilation and Foley catheter placement by urology on 07/14/2018.  Subjective: No major events overnight of this morning.  No complaints other than lack of sleep.  Sitting on bedside chair.  He denies pain, shortness of breath, nausea, vomiting or abdominal pain.  Objective: Vitals:   07/16/19 1545 07/16/19 2005 07/17/19 0424 07/17/19 0727  BP: 132/67 140/70 133/71 134/75  Pulse: 81 63 77 75  Resp:  18 16 16   Temp: 98.4 F (36.9 C) 98 F (36.7 C) 97.9 F (36.6 C) 98.3 F (36.8 C)  TempSrc: Oral Oral Axillary Oral  SpO2: 97% 97% 97% 97%  Weight:        Intake/Output Summary (Last 24 hours) at 07/17/2019 1506 Last data filed at 07/17/2019 0427 Gross per 24 hour  Intake --  Output 675 ml  Net -675 ml   Filed Weights   07/16/19 0440  Weight: 71 kg    Examination:  GENERAL: Sitting on bedside chair. HEENT: MMM.  Vision and hearing grossly intact.  NECK: Supple.  No apparent JVD.  RESP:  No IWOB. Good air movement bilaterally. CVS:  RRR. Heart sounds normal.  ABD/GI/GU: Bowel sounds present. Soft. Non tender.  Indwelling Foley cath MSK/EXT:  Moves extremities. No apparent deformity. No edema.  SKIN: no apparent skin lesion or wound NEURO: Awake, alert and oriented x4 - date and months.  No apparent focal neuro  deficit. PSYCH: Calm. Normal affect.  Procedures:  1/19-indwelling Foley catheter by urology.  Assessment & Plan: Acute blood loss anemia due to GI bleed: presents with painless hematochezia concerning for diverticular bleed.  Anemia panel normal.  Hemodynamically stable now. -Hgb 11.8 (04/2016)> 5.7 (admit)>2u> 8.4> 8.1> 7.5 -Received IV Feraheme and ESA on 1/20. -Monitor H&H. -GI following-plan for colonoscopy 1/22.  Acute on CKD-4: Cr 2.51 (04/2016)> 6.06 (admit)> 7.29> 5.94> 5.76.  Renal ultrasound consistent with CKD but no hydro or renal obstruction.  I & O incomplete but 675 overnight. -Nephrology managing. -Continue monitoring -Avoid nephrotoxic meds.  Hypernatremia: Na 146. -Start D5  Non-anion gap metabolic acidosis: Likely due to renal failure. -Per nephrology.  BPH/urethral stricture: Foley catheter placed by urology on 1/19. -Plan to continue Foley catheter for 1 week from 1/19 -Continue CTX prophylactically.  Presyncope: Likely due to #1.  Echo as below. -Monitor anemia as above  Acute systolic CHF: Echo with EF of 40 to 45%, G2DD, RWMA, mod LAE and RVSP to 47.  Not on diuretics at home.  Appears euvolemic on exam. -Continue monitoring fluid status -May consider involving cardiology once renal function improves.  History of CVA: Stable. -Continue home aspirin and a statin.  History of PVD with or for femoral bypass on DAPT -Plavix on hold for GI work-up -Continue aspirin and statin.  Hyperkalemia: Likely due  to renal failure.  Resolved.  Thrombocytopenia: too early for HIT. -Continue monitoring.  Debility/physical deconditioning -PT/OT eval               DVT prophylaxis: SCD in setting of GI bleed Code Status: Full code Family Communication: Updated patient's daughter over the phone. Disposition Plan: Remains inpatient due to GI bleed and AKI Consultants: GI, nephrology, urology   Microbiology summarized: 1/19-influenza PCR  negative. 1/19-COVID-19 negative.  Sch Meds:  Scheduled Meds: . aspirin EC  81 mg Oral Daily  . atorvastatin  20 mg Oral Daily  . Chlorhexidine Gluconate Cloth  6 each Topical Daily  . lidocaine  1 application Urethral Once  . pantoprazole  40 mg Oral Q0600  . pregabalin  75 mg Oral Daily  . sodium bicarbonate  1,300 mg Oral BID  . tiZANidine  2 mg Oral BID  . [START ON 07/20/2019] Vitamin D (Ergocalciferol)  50,000 Units Oral Q Sun   Continuous Infusions: . cefTRIAXone (ROCEPHIN)  IV Stopped (07/17/19 0730)  . dextrose 75 mL/hr at 07/17/19 0823   PRN Meds:.acetaminophen **OR** acetaminophen, albuterol, hydrALAZINE, ondansetron **OR** ondansetron (ZOFRAN) IV  Antimicrobials: Anti-infectives (From admission, onward)   Start     Dose/Rate Route Frequency Ordered Stop   07/16/19 0100  cefTRIAXone (ROCEPHIN) 1 g in sodium chloride 0.9 % 100 mL IVPB     1 g 200 mL/hr over 30 Minutes Intravenous Every 24 hours 07/16/19 0048 07/19/19 0059       I have personally reviewed the following labs and images: CBC: Recent Labs  Lab 07/15/19 1003 07/15/19 1003 07/15/19 2058 07/16/19 0234 07/16/19 0738 07/16/19 1647 07/17/19 0246  WBC 10.3  --   --   --   --   --  12.3*  NEUTROABS 7.7  --   --   --   --   --   --   HGB 5.7*   < > 8.4* 7.9* 8.1* 9.0* 7.5*  HCT 19.2*   < > 27.1* 25.1* 26.4* 29.0* 24.9*  MCV 106.1*  --   --   --   --   --  92.2  PLT 182  --   --   --   --   --  114*   < > = values in this interval not displayed.   BMP &GFR Recent Labs  Lab 07/15/19 1003 07/15/19 2058 07/16/19 0234 07/17/19 0246  NA 144 142 141 146*  K 5.3* 5.2* 5.1 4.9  CL 115* 115* 114* 115*  CO2 17* 16* 17* 19*  GLUCOSE 114* 100* 95 94  BUN 46* 47* 47* 48*  CREATININE 6.06* 7.29* 5.94* 5.76*  CALCIUM 8.9 8.6* 8.2* 8.4*  MG  --   --   --  2.4  PHOS  --   --   --  5.5*   CrCl cannot be calculated (Unknown ideal weight.). Liver & Pancreas: Recent Labs  Lab 07/15/19 1003 07/17/19 0246   AST 13*  --   ALT 11  --   ALKPHOS 104  --   BILITOT 0.6  --   PROT 6.5  --   ALBUMIN 3.6 3.1*   Recent Labs  Lab 07/15/19 1003  LIPASE 50   No results for input(s): AMMONIA in the last 168 hours. Diabetic: No results for input(s): HGBA1C in the last 72 hours. No results for input(s): GLUCAP in the last 168 hours. Cardiac Enzymes: No results for input(s): CKTOTAL, CKMB, CKMBINDEX, TROPONINI in the last 168 hours. No  results for input(s): PROBNP in the last 8760 hours. Coagulation Profile: Recent Labs  Lab 07/15/19 1003  INR 1.3*   Thyroid Function Tests: No results for input(s): TSH, T4TOTAL, FREET4, T3FREE, THYROIDAB in the last 72 hours. Lipid Profile: No results for input(s): CHOL, HDL, LDLCALC, TRIG, CHOLHDL, LDLDIRECT in the last 72 hours. Anemia Panel: Recent Labs    07/15/19 2058  VITAMINB12 262  FOLATE 7.3  FERRITIN 24  TIBC 315  IRON 61  RETICCTPCT 2.7   Urine analysis:    Component Value Date/Time   COLORURINE YELLOW 05/17/2013 0530   APPEARANCEUR CLOUDY (A) 05/17/2013 0530   LABSPEC 1.012 05/17/2013 0530   PHURINE 5.0 05/17/2013 0530   GLUCOSEU NEGATIVE 05/17/2013 0530   HGBUR NEGATIVE 05/17/2013 0530   BILIRUBINUR NEGATIVE 05/17/2013 0530   KETONESUR NEGATIVE 05/17/2013 0530   PROTEINUR 30 (A) 05/17/2013 0530   UROBILINOGEN 0.2 05/17/2013 0530   NITRITE NEGATIVE 05/17/2013 0530   LEUKOCYTESUR MODERATE (A) 05/17/2013 0530   Sepsis Labs: Invalid input(s): PROCALCITONIN, [place]  Microbiology: Recent Results (from the past 240 hour(s))  Respiratory Panel by RT PCR (Flu A&B, Covid) - Nasopharyngeal Swab     Status: None   Collection Time: 07/15/19 10:38 AM   Specimen: Nasopharyngeal Swab  Result Value Ref Range Status   SARS Coronavirus 2 by RT PCR NEGATIVE NEGATIVE Final    Comment: (NOTE) SARS-CoV-2 target nucleic acids are NOT DETECTED. The SARS-CoV-2 RNA is generally detectable in upper respiratoy specimens during the acute phase  of infection. The lowest concentration of SARS-CoV-2 viral copies this assay can detect is 131 copies/mL. A negative result does not preclude SARS-Cov-2 infection and should not be used as the sole basis for treatment or other patient management decisions. A negative result may occur with  improper specimen collection/handling, submission of specimen other than nasopharyngeal swab, presence of viral mutation(s) within the areas targeted by this assay, and inadequate number of viral copies (<131 copies/mL). A negative result must be combined with clinical observations, patient history, and epidemiological information. The expected result is Negative. Fact Sheet for Patients:  PinkCheek.be Fact Sheet for Healthcare Providers:  GravelBags.it This test is not yet ap proved or cleared by the [place] FDA and  has been authorized for detection and/or diagnosis of SARS-CoV-2 by FDA under an Emergency Use Authorization (EUA). This EUA will remain  in effect (meaning this test can be used) for the duration of the COVID-19 declaration under Section 564(b)(1) of the Act, 21 U.S.C. section 360bbb-3(b)(1), unless the authorization is terminated or revoked sooner.    Influenza A by PCR NEGATIVE NEGATIVE Final   Influenza B by PCR NEGATIVE NEGATIVE Final    Comment: (NOTE) The Xpert Xpress SARS-CoV-2/FLU/RSV assay is intended as an aid in  the diagnosis of influenza from Nasopharyngeal swab specimens and  should not be used as a sole basis for treatment. Nasal washings and  aspirates are unacceptable for Xpert Xpress SARS-CoV-2/FLU/RSV  testing. Fact Sheet for Patients: PinkCheek.be Fact Sheet for Healthcare Providers: GravelBags.it This test is not yet approved or cleared by the [place] FDA and  has been authorized for detection and/or diagnosis of SARS-CoV-2 by  FDA  under an Emergency Use Authorization (EUA). This EUA will remain  in effect (meaning this test can be used) for the duration of the  Covid-19 declaration under Section 564(b)(1) of the Act, 21  U.S.C. section 360bbb-3(b)(1), unless the authorization is  terminated or revoked. Performed at [facility], [place]  [address] [place], [place] [address]     Radiology Studies: No results found.    Tammela Bales T. Hull  If 7PM-7AM, please contact night-coverage [email or web address] Password [facility] 07/17/2019, 3:06 PM

## 2019-07-17 NOTE — Progress Notes (Addendum)
Daily Rounding Note  07/17/2019, 10:17 AM  LOS: 2 days   SUBJECTIVE:   Chief complaint:     Got winded after walking ~ 40 feet in hall, had to sit down.  HR to 108 but quick return to 80s w rest.  He never walks this far at home.  Brown stool this AM Ok with plan for colonoscopy, possible EGD tmrw.    OBJECTIVE:         Vital signs in last 24 hours:    Temp:  [97.9 F (36.6 C)-98.4 F (36.9 C)] 98.3 F (36.8 C) (01/21 0727) Pulse Rate:  [63-81] 75 (01/21 0727) Resp:  [16-18] 16 (01/21 0727) BP: (132-140)/(67-75) 134/75 (01/21 0727) SpO2:  [97 %] 97 % (01/21 0727) Last BM Date: 07/15/19 Filed Weights   07/16/19 0440  Weight: 71 kg   General: pleasant, alert, somewhat frail.     Heart: RRR Chest: clear bil.   Abdomen: soft, NT, ND.  Active BS  Extremities: no CCE, thin legs and arms Neuro/Psych:  Appropriate.  Slight RUE weakness.    Intake/Output from previous day: 01/20 0701 - 01/21 0700 In: -  Out: 675 [Urine:675]  Intake/Output this shift: No intake/output data recorded.  Lab Results: Recent Labs    07/15/19 1003 07/15/19 2058 07/16/19 0738 07/16/19 1647 07/17/19 0246  WBC 10.3  --   --   --  12.3*  HGB 5.7*   < > 8.1* 9.0* 7.5*  HCT 19.2*   < > 26.4* 29.0* 24.9*  PLT 182  --   --   --  114*   < > = values in this interval not displayed.   BMET Recent Labs    07/15/19 2058 07/16/19 0234 07/17/19 0246  NA 142 141 146*  K 5.2* 5.1 4.9  CL 115* 114* 115*  CO2 16* 17* 19*  GLUCOSE 100* 95 94  BUN 47* 47* 48*  CREATININE 7.29* 5.94* 5.76*  CALCIUM 8.6* 8.2* 8.4*   LFT Recent Labs    07/15/19 1003 07/17/19 0246  PROT 6.5  --   ALBUMIN 3.6 3.1*  AST 13*  --   ALT 11  --   ALKPHOS 104  --   BILITOT 0.6  --    PT/INR Recent Labs    07/15/19 1003  LABPROT 16.2*  INR 1.3*    Studies/Results: US RENAL  Result Date: 07/15/2019 CLINICAL DATA:  Acute kidney injury. EXAM: RENAL  / URINARY TRACT ULTRASOUND COMPLETE COMPARISON:  March 22, 2017. FINDINGS: Right Kidney: Renal measurements: 10.7 x 5.3 x 4.2 cm = volume: 123 mL. Increased echogenicity of renal parenchyma is noted. Two simple cysts are noted, the largest measuring 1.8 cm. No mass or hydronephrosis visualized. Left Kidney: Renal measurements: 8.2 x 4.5 x 4.0 cm = volume: 76 mL. Increased echogenicity of renal parenchyma is noted. 1.2 cm simple cyst is noted. No mass or hydronephrosis visualized. Bladder: Appears normal for degree of bladder distention. Other: None. IMPRESSION: Increased echogenicity of renal parenchyma is noted bilaterally suggesting medical renal disease. Small bilateral renal cysts are noted. No hydronephrosis or renal obstruction is noted. Electronically Signed   By: Marijo Conception M.D.   On: 07/15/2019 16:23   DG Chest Port 1 View  Result Date: 07/15/2019 CLINICAL DATA:  Recent syncopal episode EXAM: PORTABLE CHEST 1 VIEW COMPARISON:  05/19/2016 FINDINGS: Cardiac shadow is mildly enlarged but accentuated by the portable technique. Small right-sided pleural effusion is noted.  There is likely underlying basilar atelectasis. No other focal infiltrate is seen. No bony abnormality is noted. IMPRESSION: Small right pleural effusion with underlying basilar atelectasis. Electronically Signed   By: Inez Catalina M.D.   On: 07/15/2019 10:54   ECHOCARDIOGRAM COMPLETE  Result Date: 07/16/2019   ECHOCARDIOGRAM REPORT   Patient Name:   Bobby Vega Date of Exam: 07/16/2019 Medical Rec #:  [id number]      Height:       65.0 in Accession #:    [id number]     Weight:       156.5 lb Date of Birth:  83-30-1936      BSA:          1.78 m Patient Age:    85 years       BP:           144/62 mmHg Patient Gender: M              HR:           82 bpm. Exam Location:  Inpatient Procedure: 2D Echo Indications:    Dyspnea 786.09 / R06.00  History:        Patient has prior history of Echocardiogram examinations, most                  recent 05/16/2016. CHF, Stroke; Risk Factors:Hypertension. AKI                 (acute kidney injury)                 CKD (chronic kidney disease).  Sonographer:    Vikki Ports Turrentine Referring Phys: [id number] [place]  1. Left ventricular ejection fraction, by visual estimation, is 40 to 45%. The left ventricle has mild to moderately decreased function. There is no left ventricular hypertrophy.  2. Basal inferolateral segment is abnormal.  3. Left ventricular diastolic parameters are consistent with Grade II diastolic dysfunction (pseudonormalization).  4. The left ventricle demonstrates regional wall motion abnormalities.  5. Global right ventricle has normal systolic function.The right ventricular size is normal. No increase in right ventricular wall thickness.  6. Left atrial size was moderately dilated.  7. Right atrial size was mildly dilated.  8. Moderate pleural effusion in the left lateral region.  9. The mitral valve is normal in structure. Mild mitral valve regurgitation. No evidence of mitral stenosis. 10. The tricuspid valve is normal in structure. 11. The tricuspid valve is normal in structure. Tricuspid valve regurgitation is mild. 12. The aortic valve is normal in structure. Aortic valve regurgitation is not visualized. No evidence of aortic valve sclerosis or stenosis. 13. The pulmonic valve was normal in structure. Pulmonic valve regurgitation is not visualized. 14. Moderately elevated pulmonary artery systolic pressure. 15. The tricuspid regurgitant velocity is 2.84 m/s, and with an assumed right atrial pressure of 15 mmHg, the estimated right ventricular systolic pressure is moderately elevated at 47.3 mmHg. 16. The inferior vena cava is dilated in size with <50% respiratory variability, suggesting right atrial pressure of 15 mmHg. FINDINGS  Left Ventricle: Left ventricular ejection fraction, by visual estimation, is 40 to 45%. The left ventricle has mild to moderately decreased  function. The left ventricle demonstrates regional wall motion abnormalities. There is no left ventricular hypertrophy. Left ventricular diastolic parameters are consistent with Grade II diastolic dysfunction (pseudonormalization). Normal left atrial pressure.  LV Wall Scoring: The basal inferolateral segment is akinetic. Right Ventricle: The right ventricular size is normal. No  increase in right ventricular wall thickness. Global RV systolic function is has normal systolic function. The tricuspid regurgitant velocity is 2.84 m/s, and with an assumed right atrial pressure  of 15 mmHg, the estimated right ventricular systolic pressure is moderately elevated at 47.3 mmHg. Left Atrium: Left atrial size was moderately dilated. Right Atrium: Right atrial size was mildly dilated Pericardium: There is no evidence of pericardial effusion. There is a moderate pleural effusion in the left lateral region. Mitral Valve: The mitral valve is normal in structure. Mild mitral valve regurgitation. No evidence of mitral valve stenosis by observation. Tricuspid Valve: The tricuspid valve is normal in structure. Tricuspid valve regurgitation is mild. Aortic Valve: The aortic valve is normal in structure. Aortic valve regurgitation is not visualized. The aortic valve is structurally normal, with no evidence of sclerosis or stenosis. Pulmonic Valve: The pulmonic valve was normal in structure. Pulmonic valve regurgitation is not visualized. Pulmonic regurgitation is not visualized. Aorta: The aortic root, ascending aorta and aortic arch are all structurally normal, with no evidence of dilitation or obstruction. Venous: The inferior vena cava is dilated in size with less than 50% respiratory variability, suggesting right atrial pressure of 15 mmHg. IAS/Shunts: No atrial level shunt detected by color flow Doppler. There is no evidence of a patent foramen ovale. No ventricular septal defect is seen or detected. There is no evidence of an  atrial septal defect.  LEFT VENTRICLE PLAX 2D LVIDd:         4.00 cm  Diastology LVIDs:         3.30 cm  LV e' lateral:   5.33 cm/s LV PW:         1.00 cm  LV E/e' lateral: 19.5 LV IVS:        1.00 cm  LV e' medial:    3.53 cm/s LVOT diam:     2.10 cm  LV E/e' medial:  29.5 LV SV:         26 ml LV SV Index:   14.22 LVOT Area:     3.46 cm  RIGHT VENTRICLE RV S prime:     12.20 cm/s TAPSE (M-mode): 2.0 cm LEFT ATRIUM             Index       RIGHT ATRIUM           Index LA diam:        3.80 cm 2.13 cm/m  RA Area:     21.00 cm LA Vol (A2C):   64.3 ml 36.07 ml/m RA Volume:   66.30 ml  37.20 ml/m LA Vol (A4C):   88.5 ml 49.65 ml/m LA Biplane Vol: 82.2 ml 46.12 ml/m  AORTIC VALVE LVOT Vmax:   60.10 cm/s LVOT Vmean:  41.400 cm/s LVOT VTI:    0.106 m  AORTA Ao Root diam: 2.80 cm MITRAL VALVE                         TRICUSPID VALVE MV Area (PHT): 3.68 cm              TR Peak grad:   32.3 mmHg MV PHT:        59.74 msec            TR Vmax:        284.00 cm/s MV Decel Time: 206 msec MV E velocity: 104.00 cm/s 103 cm/s  SHUNTS MV A velocity: 60.00 cm/s  70.3 cm/s Systemic VTI:  0.11 m MV E/A ratio:  1.73        1.5       Systemic Diam: 2.10 cm  Candee Furbish MD Electronically signed by Candee Furbish MD Signature Date/Time: 07/16/2019/1:55:27 PM    Final     ASSESMENT:   *  Severe anemia, macrocytosis.  Ferritin 24, iron 61.  B12 and folate wnl but labs drawn after PRBCs.    Hgb 5.7 >> 2 PRBCs >> 9 >> 7.5.     *   Rectal bleeding x 2 weeks PTA.    *    Advance CKD, stage 5.  certainly contributing to anemia.    *   BPH with bladder outlet obstruction.  Urology required to place foley yesterday. Hx urethral stricture.  Urology ordered 3 d of Rocephin.    *   Hyperkalemia. K now 5.1, upper limits normal.    *   Chronic Plavix for hx CVA.  Last dose 1/19 AM, on hold.    *   Episodic bradycardia.      PLAN   *   Colonoscopy/possible EGD tmrw? Begin clears at lunch if procedures planned for tmrw.   . Hgb/crit 5 PM, CBC in AM.      Azucena Freed  07/17/2019, 10:17 AM Phone [place] Attending   I have taken an interval history, reviewed the chart and examined the patient. I agree with the Advanced Practitioner's note, impression and recommendations.   He is mildly tachypneic in bed with head up Repeat Hgb pending   I am not sure he is ready for sedation and endoscopic evaluation  I moved up timing of recheck Hgb and will see   In the interest of understanding his problems and to expedite care I think it is reasonable to scope before 5 d washout of Plavix but again not sure he is ready otherwise   Will follow up  Gatha Mayer, MD, [facility] 07/17/2019 3:34 PM  [phone]

## 2019-07-17 NOTE — Progress Notes (Signed)
Admit: 07/15/2019 LOS: 2  78M with AoCKD4 (or more likely, progressive CKD5), symptomatic anemia with hematochezia  Subjective:  . Poor appetite, not eating much of his food . Potentially for colonoscopy tomorrow . Creatinine slightly improved to 5.8, K4.9, bicarbonate increased to 19 . 0.7 L UOP with Foley catheter . Denies nausea/vomiting . Started ESA and received Feraheme yesterday  01/20 0701 - 01/21 0700 In: -  Out: [place] Weights   07/16/19 0440  Weight: 71 kg    Scheduled Meds: . aspirin EC  81 mg Oral Daily  . atorvastatin  20 mg Oral Daily  . Chlorhexidine Gluconate Cloth  6 each Topical Daily  . lidocaine  1 application Urethral Once  . pantoprazole  40 mg Oral Q0600  . pregabalin  75 mg Oral Daily  . sodium bicarbonate  1,300 mg Oral BID  . tiZANidine  2 mg Oral BID  . [START ON 07/20/2019] Vitamin D (Ergocalciferol)  50,000 Units Oral Q Sun   Continuous Infusions: . cefTRIAXone (ROCEPHIN)  IV Stopped (07/17/19 0730)  . dextrose 75 mL/hr at 07/17/19 0823   PRN Meds:.acetaminophen **OR** acetaminophen, albuterol, hydrALAZINE, ondansetron **OR** ondansetron (ZOFRAN) IV  Current Labs: reviewed  Results for KAITLIN, ARDITO (MRN [id number]) as of 07/16/2019 13:46  Ref. Range 07/15/2019 20:58  Saturation Ratios Latest Ref Range: 17.9 - 39.5 % 19  Ferritin Latest Ref Range: 24 - 336 ng/mL 24    Physical Exam:  Blood pressure 134/75, pulse 75, temperature 98.3 F (36.8 C), temperature source Oral, resp. rate 16, weight 71 kg, SpO2 97 %. GEN: Elderly male, lying in bed, chronically ill-appearing ENT: NCAT EYES: EOMI, glasses on CV: Regular, normal S1 and S2 PULM: Clear bilaterally, normal work of breathing ABD: No significant tenderness, soft SKIN: No rashes or lesions EXT: No peripheral edema  A 1. Renal failure, favor progressive CKD, potentially having an acute component with his severe anemia.    Slowly improving from admission but remains  with very low GFR.  h.  Not uremic and no immed indication for HD.  Hopefully he can stabilize and we can follow-up in the office and see how he does.  Exploring options of RRT and conservative care.  Previously followed at [facility] with Dr. Marval Regal not seen since 2019 2. ABLA, likely exacerbated by #1, improved; alsow ith IDA; status post ESA and Feraheme 1/20 3. LGIB, GI following, for colonoscopy 1/22 4. PVD on DAPT history of aortobifemoral bypass 5. History of CVA ago 6. History of diastolic heart failure 7. History of BPH and urethral stricture requiring dilatation, req Foley with urology 8. Hypertension, mildly elevated currently 9. Metabolic acidosis on NaHCO3, slowly improving  P . Cont supportive care . Will cont to discuss mgmt of late stage CKD, potential HD Daily weights, Daily Renal Panel, Strict I/Os, Avoid nephrotoxins (NSAIDs, judicious IV Contrast)   Pearson Grippe MD 07/17/2019, 11:43 AM  Recent Labs  Lab 07/15/19 2058 07/16/19 0234 07/17/19 0246  NA 142 141 146*  K 5.2* 5.1 4.9  CL 115* 114* 115*  CO2 16* 17* 19*  GLUCOSE 100* 95 94  BUN 47* 47* 48*  CREATININE 7.29* 5.94* 5.76*  CALCIUM 8.6* 8.2* 8.4*  PHOS  --   --  5.5*   Recent Labs  Lab 07/15/19 1003 07/15/19 2058 07/16/19 0738 07/16/19 1647 07/17/19 0246  WBC 10.3  --   --   --  12.3*  NEUTROABS 7.7  --   --   --   --  HGB 5.7*   < > 8.1* 9.0* 7.5*  HCT 19.2*   < > 26.4* 29.0* 24.9*  MCV 106.1*  --   --   --  92.2  PLT 182  --   --   --  114*   < > = values in this interval not displayed.

## 2019-07-17 NOTE — Evaluation (Signed)
Physical Therapy Evaluation Patient Details Name: Bobby Vega MRN: [id number] DOB: [birth year] Today's Date: 07/17/2019   History of Present Illness  Pt is an 85 y.o. male admitted 07/15/19 with rectal bleed, progressive weakness and presyncope; concern for diverticular bleed. Also with AKI on CKD4. (-) COVID-19. Plan for possible colonoscopy and EGD 1/22. PMH includes CHF, CVA, PVD, cervical spondylosis, arthritis.    Clinical Impression  Pt presents with an overall decrease in functional mobility secondary to above. PTA, pt independent household ambulator, has daily aide assist for ADLs and household tasks; reports primarily sedentary, only walks household distances. Today, pt trialled gait training with and without RW, stability improved with RW; pt required seated rest break due to SOB with minimal activity. SpO2 88-92% on RA, HR 80-102. Pt would benefit from continued acute PT services to maximize functional mobility and independence prior to d/c with HHPT services if agreeable.     Follow Up Recommendations Home health PT;Supervision - Intermittent(increased aide support)    Equipment Recommendations  None recommended by PT    Recommendations for Other Services       Precautions / Restrictions Precautions Precautions: Fall Restrictions Weight Bearing Restrictions: No      Mobility  Bed Mobility Overal bed mobility: Needs Assistance Bed Mobility: Supine to Sit     Supine to sit: Min assist     General bed mobility comments: Received sitting in recliner  Transfers Overall transfer level: Needs assistance Equipment used: Rolling walker (2 wheeled);None Transfers: Sit to/from Stand Sit to Stand: Supervision Stand pivot transfers: Min assist;Min guard       General transfer comment: Cues for hand placement standing to RW, able to stand from multiple surface heights to RW with supervision for safety. Stood from [facility] (over toilet) without DME at supervision-level due to  instability and fall risk  Ambulation/Gait Ambulation/Gait assistance: Min guard Gait Distance (Feet): 220 Feet Assistive device: Rolling walker (2 wheeled);None Gait Pattern/deviations: Step-through pattern;Decreased stride length;Trunk flexed   Gait velocity interpretation: >2.62 ft/sec, indicative of community ambulatory General Gait Details: Hallway gait training with RW, pt moving fast and quick to get SOB requiring seated rest break, HR up to 102, SpO2 92% on RA. Additional gait in room without DME, min guard for balance, 1x self-correct instability with turning. Pt with poor safety awareness requiring cues for safety with lines and decreased gait speed  Stairs            Wheelchair Mobility    Modified Rankin (Stroke Patients Only)       Balance Overall balance assessment: Needs assistance Sitting-balance support: Bilateral upper extremity supported;Feet supported Sitting balance-Leahy Scale: Fair     Standing balance support: Bilateral upper extremity supported;During functional activity Standing balance-Leahy Scale: Fair Standing balance comment: Can static stand without UE support to perform posterior pericare; ambulatory without UE support and intermittent min guard                             Pertinent Vitals/Pain Pain Assessment: Faces Faces Pain Scale: Hurts a little bit Pain Location: Abdomen discomfort attempting to have bowel movement Pain Descriptors / Indicators: Moaning Pain Intervention(s): Monitored during session    Home Living Family/patient expects to be discharged to:: Private residence Living Arrangements: Alone Available Help at Discharge: Family;Personal care attendant;Available PRN/intermittently Type of Home: House Home Access: Stairs to enter     Home Layout: One level Home Equipment: Environmental consultant - 2 wheels;Cane - single point;Shower  seat Additional Comments: Aide assists daily with household tasks and shower transfer/bathing     Prior Function Level of Independence: Needs assistance   Gait / Transfers Assistance Needed: Reports independent, intermittent use of SPC. Primarily sedentary, mainly sits and watches tv all day, does not leave house. "My family gets on me that I don't walk more"  ADL's / Homemaking Assistance Needed: aide asssits withshower transfer and bathing tasks        Hand Dominance   Dominant Hand: Right    Extremity/Trunk Assessment   Upper Extremity Assessment Upper Extremity Assessment: Generalized weakness    Lower Extremity Assessment Lower Extremity Assessment: Generalized weakness    Cervical / Trunk Assessment Cervical / Trunk Assessment: Kyphotic  Communication   Communication: No difficulties  Cognition Arousal/Alertness: Awake/alert Behavior During Therapy: WFL for tasks assessed/performed Overall Cognitive Status: No family/caregiver present to determine baseline cognitive functioning                                 General Comments: Some poor insight into current deficits and safety awareness. Following commands and interacting appropriately      General Comments General comments (skin integrity, edema, etc.): SpO2 88-92% on RA    Exercises     Assessment/Plan    PT Assessment Patient needs continued PT services  PT Problem List Decreased strength;Decreased activity tolerance;Decreased balance;Decreased mobility;Decreased cognition;Decreased knowledge of use of DME;Decreased safety awareness;Cardiopulmonary status limiting activity       PT Treatment Interventions DME instruction;Gait training;Stair training;Functional mobility training;Therapeutic activities;Therapeutic exercise;Balance training;Patient/family education    PT Goals (Current goals can be found in the Care Plan section)  Acute Rehab PT Goals Patient Stated Goal: Return home, increased strength PT Goal Formulation: With patient Time For Goal Achievement: 07/31/19 Potential  to Achieve Goals: Good    Frequency Min 3X/week   Barriers to discharge Decreased caregiver support      Co-evaluation               AM-PAC PT "6 Clicks" Mobility  Outcome Measure Help needed turning from your back to your side while in a flat bed without using bedrails?: None Help needed moving from lying on your back to sitting on the side of a flat bed without using bedrails?: None Help needed moving to and from a bed to a chair (including a wheelchair)?: A Little Help needed standing up from a chair using your arms (e.g., wheelchair or bedside chair)?: A Little Help needed to walk in hospital room?: A Little Help needed climbing 3-5 steps with a railing? : A Little 6 Click Score: 20    End of Session Equipment Utilized During Treatment: Gait belt Activity Tolerance: Patient tolerated treatment well Patient left: in chair;with call bell/phone within reach;with chair alarm set Nurse Communication: Mobility status PT Visit Diagnosis: Other abnormalities of gait and mobility (R26.89)    Time: 9147-8295 PT Time Calculation (min) (ACUTE ONLY): 28 min   Charges:   PT Evaluation $PT Eval Moderate Complexity: 1 Mod PT Treatments $Gait Training: 8-22 mins   Mabeline Caras, PT, DPT Acute Rehabilitation Services  Pager [phone] Office [place] 07/17/2019, 11:48 AM

## 2019-07-17 NOTE — Evaluation (Signed)
Occupational Therapy Evaluation Patient Details Name: Bobby Vega MRN: [id number] DOB: 01-04-1935 Today's Date: 07/17/2019    History of Present Illness Pt is an 85 y.o. male admitted 07/15/19 with rectal bleed, progressive weakness and presyncope; concern for diverticular bleed. Also with AKI on CKD4. (-) COVID-19. PMH includes CHF, CVA, PVD, cervical spondylosis, arthritis.   Clinical Impression   Pt admitted with the above diagnoses and presents with below problem list. Pt will benefit from continued acute OT to address the below listed deficits and maximize independence with basic ADLs prior to d/c home. Pt reports he lives alone and has an aide who assist during the week with bathing and household tasks. Pt with some decreased safety awareness, no family present to determine baseline with cognition. Currently needing min -mod A with LB ADLs and pericare in sit<>stand, mostly min guard with functional mobility.      Follow Up Recommendations  Home health OT;Supervision - Intermittent;Other (comment)(increase home aide hours)    Equipment Recommendations  None recommended by OT    Recommendations for Other Services PT consult     Precautions / Restrictions Precautions Precautions: Fall Restrictions Weight Bearing Restrictions: No      Mobility Bed Mobility Overal bed mobility: Needs Assistance Bed Mobility: Supine to Sit     Supine to sit: Min assist     General bed mobility comments: Pt reaching for therapist arm to pull up on to powerup to EOB  Transfers Overall transfer level: Needs assistance Equipment used: Rolling walker (2 wheeled) Transfers: Sit to/from [facility] Sit to Stand: Min guard;From elevated surface Stand pivot transfers: Min assist;Min guard       General transfer comment: to/from EOB, BSC over toilet, and recliner. Improved balance with time.     Balance Overall balance assessment: Needs assistance Sitting-balance support:  Bilateral upper extremity supported;Feet supported Sitting balance-Leahy Scale: Fair     Standing balance support: Bilateral upper extremity supported;During functional activity Standing balance-Leahy Scale: Poor                             ADL either performed or assessed with clinical judgement   ADL Overall ADL's : Needs assistance/impaired Eating/Feeding: Set up;Sitting   Grooming: Set up;Sitting   Upper Body Bathing: Minimal assistance;Sitting   Lower Body Bathing: Sit to/from stand;Moderate assistance   Upper Body Dressing : Set up;Sitting   Lower Body Dressing: Minimal assistance;Sit to/from stand;Moderate assistance   Toilet Transfer: Min guard;Minimal assistance;Ambulation;RW(3n1 over toilet)   Toileting- Clothing Manipulation and Hygiene: Moderate assistance;Sit to/from stand Toileting - Clothing Manipulation Details (indicate cue type and reason): pt sruggling with posterior perciare in standing with rw. Therapist finished pericare while pt stood with rw and min guard assist Tub/ Shower Transfer: Minimal assistance;Ambulation;3 in 1;Rolling walker   Functional mobility during ADLs: Min guard;Minimal assistance;Rolling walker General ADL Comments: Pt with unsteadiness and decreased safety awareness on intial OOB walk to bathroom. Pt bumping into bathroom door and needing cues to realize this and fix it. Pt seems internally distracted at times.      Vision Baseline Vision/History: Wears glasses       Perception     Praxis      Pertinent Vitals/Pain Pain Assessment: Faces Faces Pain Scale: Hurts even more Pain Location: penis at site of catheter insertion, moaning/grimacing for prolonged timewhile having bowelmovement Pain Descriptors / Indicators: Grimacing;Moaning Pain Intervention(s): Monitored during session;Other (comment)(NT provided catheter care)  Hand Dominance Right   Extremity/Trunk Assessment Upper Extremity Assessment Upper  Extremity Assessment: Generalized weakness   Lower Extremity Assessment Lower Extremity Assessment: Defer to PT evaluation       Communication Communication Communication: No difficulties   Cognition Arousal/Alertness: Lethargic;Awake/alert Behavior During Therapy: Flat affect;Impulsive;WFL for tasks assessed/performed Overall Cognitive Status: No family/caregiver present to determine baseline cognitive functioning                                 General Comments: Lethargic upon arrival, more alert once EOB/OOB. Tangential responses at times. Decreased safety awareness while ambulating to bathroom.    General Comments       Exercises     Shoulder Instructions      Home Living Family/patient expects to be discharged to:: Private residence Living Arrangements: Alone Available Help at Discharge: Family Type of Home: House Home Access: Stairs to enter     [place]: One level     Bathroom Shower/Tub: Tub/shower unit         Home Equipment: Environmental consultant - 2 wheels;Cane - single point;Shower seat   Additional Comments: Aide assist with household tasks and shower transfer/bathing      Prior Functioning/Environment Level of Independence: Needs assistance  Gait / Transfers Assistance Needed: occasionaluse of cane ADL's / Homemaking Assistance Needed: aide asssits withshower transfer and bathingtasks            OT Problem List: Decreased strength;Decreased activity tolerance;Impaired balance (sitting and/or standing);Decreased cognition;Decreased safety awareness;Decreased knowledge of use of DME or AE;Decreased knowledge of precautions;Pain      OT Treatment/Interventions: Self-care/ADL training;DME and/or AE instruction;Therapeutic activities;Cognitive remediation/compensation;Patient/family education;Balance training    OT Goals(Current goals can be found in the care plan section) Acute Rehab OT Goals Patient Stated Goal: reduce pain, be able to "wipe  myself" (toileting) OT Goal Formulation: With patient Time For Goal Achievement: 07/31/19 Potential to Achieve Goals: Good ADL Goals Pt Will Perform Lower Body Bathing: with min guard assist;sit to/from stand Pt Will Perform Lower Body Dressing: with supervision;sit to/from stand Pt Will Transfer to Toilet: with modified independence;ambulating Pt Will Perform Toileting - Clothing Manipulation and hygiene: with modified independence;sit to/from stand  OT Frequency: Min 2X/week   Barriers to D/C:    from home alone, aide comes in to assist, unclear how many hours/days he has aide help       Co-evaluation              AM-PAC OT "6 Clicks" Daily Activity     Outcome Measure Help from another person eating meals?: None Help from another person taking care of personal grooming?: None Help from another person toileting, which includes using toliet, bedpan, or urinal?: A Lot Help from another person bathing (including washing, rinsing, drying)?: A Lot Help from another person to put on and taking off regular upper body clothing?: A Little Help from another person to put on and taking off regular lower body clothing?: A Lot 6 Click Score: 17   End of Session Equipment Utilized During Treatment: Rolling walker;Gait belt;Oxygen(transitioned to RA during session)  Activity Tolerance: Patient tolerated treatment well;Patient limited by fatigue Patient left: in chair;with call bell/phone within reach;with nursing/sitter in room(NT in room providing cath care and bathing)  OT Visit Diagnosis: Unsteadiness on feet (R26.81);Muscle weakness (generalized) (M62.81);Other symptoms and signs involving cognitive function;Pain  Time: 0925-1000 OT Time Calculation (min): 35 min Charges:  OT General Charges $OT Visit: 1 Visit OT Evaluation $OT Eval Low Complexity: 1 Low OT Treatments $Self Care/Home Management : 8-22 mins  Tyrone Schimke, OT Acute Rehabilitation  Services Pager: [phone] Office: [phone]   Hortencia Pilar 07/17/2019, 11:06 AM

## 2019-07-18 LAB — CBC
HCT: 25 % — ABNORMAL LOW (ref 39.0–52.0)
Hemoglobin: 7.6 g/dL — ABNORMAL LOW (ref 13.0–17.0)
MCH: 27.9 pg (ref 26.0–34.0)
MCHC: 30.4 g/dL (ref 30.0–36.0)
MCV: 91.9 fL (ref 80.0–100.0)
Platelets: 128 10*3/uL — ABNORMAL LOW (ref 150–400)
RBC: 2.72 MIL/uL — ABNORMAL LOW (ref 4.22–5.81)
RDW: 20 % — ABNORMAL HIGH (ref 11.5–15.5)
WBC: 11.8 10*3/uL — ABNORMAL HIGH (ref 4.0–10.5)
nRBC: 1.4 % — ABNORMAL HIGH (ref 0.0–0.2)

## 2019-07-18 LAB — RENAL FUNCTION PANEL
Albumin: 2.9 g/dL — ABNORMAL LOW (ref 3.5–5.0)
Anion gap: 9 (ref 5–15)
BUN: 43 mg/dL — ABNORMAL HIGH (ref 8–23)
CO2: 22 mmol/L (ref 22–32)
Calcium: 8.1 mg/dL — ABNORMAL LOW (ref 8.9–10.3)
Chloride: 107 mmol/L (ref 98–111)
Creatinine, Ser: 5.51 mg/dL — ABNORMAL HIGH (ref 0.61–1.24)
GFR calc Af Amer: 10 mL/min — ABNORMAL LOW (ref >60)
GFR calc non Af Amer: 9 mL/min — ABNORMAL LOW (ref >60)
Glucose, Bld: 103 mg/dL — ABNORMAL HIGH (ref 70–99)
Phosphorus: 4.9 mg/dL — ABNORMAL HIGH (ref 2.5–4.6)
Potassium: 4.5 mmol/L (ref 3.5–5.1)
Sodium: 138 mmol/L (ref 135–145)

## 2019-07-18 LAB — MAGNESIUM: Magnesium: 2.4 mg/dL (ref 1.7–2.4)

## 2019-07-18 MED ORDER — PEG-KCL-NACL-NASULF-NA ASC-C 100 G PO SOLR: 1.0000 | Freq: Once | ORAL | Status: DC

## 2019-07-18 MED ORDER — PEG-KCL-NACL-NASULF-NA ASC-C 100 G PO SOLR
0.5000 | Freq: Once | ORAL | Status: AC
  Administered 2019-07-18: 100 g via ORAL
  Filled 2019-07-18: qty 1

## 2019-07-18 MED ORDER — PEG-KCL-NACL-NASULF-NA ASC-C 100 G PO SOLR
0.5000 | Freq: Once | ORAL | Status: AC
  Administered 2019-07-19: 100 g via ORAL
  Filled 2019-07-18: qty 1

## 2019-07-18 NOTE — Progress Notes (Signed)
     Progress Note    ASSESSMENT AND PLAN:   85 yo male with pmh significant for CKD, HTN, SMA thrombosis, CVA, PVD s/p aorto femoral bypass on asa and plavix   1. Painless hematochezia / profound anemia with hgb of 5.7.  -hgb stable in 7 range following 2 u blood. Last colonoscopy in 2005.  -Needs colonoscopy. He looks okay today, says he feels up to a prep and colonoscopy tomorrow.   The risks and benefits of colonoscopy with possible polypectomy / biopsies were discussed and the patient agrees to proceed.    2. Respiratory distress ( on admission), probably combination of volume overload ( acute systolic heart failure EF 40-45%) and anemia. OT in room now. 02 sats 95% on RA after transferring from chair to bed. Not in any respiratory distress now  AKI on CKD. Cr 5.21 ( improving)    SUBJECTIVE   Feels okay. He had bright red blood in BM again today. Working with OT right now.   OBJECTIVE:     Vital signs in last 24 hours: Temp:  [97.4 F (36.3 C)-98.7 F (37.1 C)] 97.4 F (36.3 C) (01/22 0713) Pulse Rate:  [57-89] 57 (01/22 0713) Resp:  [16-20] 16 (01/22 0713) BP: (132-164)/(70-78) 132/78 (01/22 0713) SpO2:  [90 %-99 %] 99 % (01/22 0713) Last BM Date: 07/18/19 General:   Alert, well-developed male in NAD EENT:  Normal hearing, non icteric sclera   Heart:  Regular rate and rhythm;  No lower extremity edema   Pulm: Normal respiratory effort . Diminished breath sounds at bilateral bases. A few bibasilar scattered crackles  Abdomen:  Soft, nondistended, nontender.  Normal bowel sounds.          Neurologic:  Alert and  oriented x4;  grossly normal neurologically. Psych:  Pleasant, cooperative.  Normal mood and affect.   Intake/Output from previous day: 01/21 0701 - 01/22 0700 In: 592.9 [I.V.:492.9; IV Piggyback:100] Out: 750 [Urine:750] Intake/Output this shift: No intake/output data recorded.  Lab Results: Recent Labs    07/17/19 0246 07/17/19 1629  07/18/19 0208  WBC 12.3*  --  11.8*  HGB 7.5* 7.4* 7.6*  HCT 24.9* 23.3* 25.0*  PLT 114*  --  128*   BMET Recent Labs    07/16/19 0234 07/17/19 0246 07/18/19 0208  NA 141 146* 138  K 5.1 4.9 4.5  CL 114* 115* 107  CO2 17* 19* 22  GLUCOSE 95 94 103*  BUN 47* 48* 43*  CREATININE 5.94* 5.76* 5.51*  CALCIUM 8.2* 8.4* 8.1*   LFT Recent Labs    07/18/19 0208  ALBUMIN 2.9*     Active Problems:   Acute GI bleeding   Lower GI bleed     LOS: 3 days   Tye Savoy ,NP 07/18/2019, 11:29 AM

## 2019-07-18 NOTE — Progress Notes (Signed)
RN made consult for IV team due to pt removing PIV in attempts to get to [facility]. RN had conversation with IV team - since pt is to have bowel prep for colonoscopy throughout the night. Night shift RN to place another IV consult for pt's 0730 colonscopy in attempts to have PIV not come out prior to procedure. RN reported finding to [place].

## 2019-07-18 NOTE — Progress Notes (Signed)
Physical Therapy Treatment Patient Details Name: Bobby Vega MRN: [id number] DOB: Mar 30, 1935 Today's Date: 07/18/2019    History of Present Illness Pt is an 85 y.o. male admitted 07/15/19 with rectal bleed, progressive weakness and presyncope; concern for diverticular bleed. Also with AKI on CKD4. (-) COVID-19. Plan for possible colonoscopy and EGD 1/22. PMH includes CHF, CVA, PVD, cervical spondylosis, arthritis.   PT Comments    Pt with increased fatigue this morning. Able to perform standing mobility with and without DME, min guard for balance due to instability. 1x bloody bowel movement during session, pt reports feeling constipated. Pt remains limited by generalized weakness and decreased activity tolerance.  BP 129/69 SpO2 92-94% on RA HR 78    Follow Up Recommendations  Home health PT;Supervision - Intermittent(increased aide support)     Equipment Recommendations  None recommended by PT    Recommendations for Other Services       Precautions / Restrictions Precautions Precautions: Fall Restrictions Weight Bearing Restrictions: No    Mobility  Bed Mobility Overal bed mobility: Modified Independent Bed Mobility: Supine to Sit           General bed mobility comments: HOB slightly elevated  Transfers Overall transfer level: Needs assistance Equipment used: Rolling walker (2 wheeled);None Transfers: Sit to/from Stand Sit to Stand: Supervision;Min guard         General transfer comment: Cues for hand placement standing to RW, able to stand from chair and EOB to RW at supervision-level. Stood from [facility] (over toilet) without DME at supervision-level due to instability and fall risk  Ambulation/Gait Ambulation/Gait assistance: Min guard Gait Distance (Feet): 60 Feet Assistive device: Rolling walker (2 wheeled);None     Gait velocity interpretation: 1.31 - 2.62 ft/sec, indicative of limited community ambulator General Gait Details: Gait training in room due  to reported fatigue. Slow gait with RW and intermittent min guard for balance, 1x seated rest break due to fatigue; additional gait without DME and pt requiring closer guarding due to instability, no overt LOB. Poor safety awareness with lines. Pt denies dizziness; post-ambulation BP 129/69, SpO2 92-95% on RA   Stairs             Wheelchair Mobility    Modified Rankin (Stroke Patients Only)       Balance Overall balance assessment: Needs assistance Sitting-balance support: Bilateral upper extremity supported;Feet supported Sitting balance-Leahy Scale: Fair     Standing balance support: Bilateral upper extremity supported;During functional activity;No upper extremity supported Standing balance-Leahy Scale: Fair Standing balance comment: Can static stand without UE support to perform posterior pericare; ambulatory without UE support and intermittent min guard                            Cognition Arousal/Alertness: Awake/alert Behavior During Therapy: WFL for tasks assessed/performed;Flat affect Overall Cognitive Status: No family/caregiver present to determine baseline cognitive functioning                                 General Comments: More fatigued than yesterday's session. Some poor insight into current deficits and safety awareness. Following commands and interacting appropriately      Exercises      General Comments General comments (skin integrity, edema, etc.): SpO2 92-94% on RA, BP 129/69, HR 78; pt with blood stools during session (NT notified of this and catheter leaking)      Pertinent Vitals/Pain  Pain Assessment: Faces Faces Pain Scale: Hurts a little bit Pain Location: Abdomen discomfort attempting to have bowel movement Pain Descriptors / Indicators: Moaning Pain Intervention(s): Monitored during session    Home Living                      Prior Function            PT Goals (current goals can now be found in  the care plan section) Acute Rehab PT Goals Patient Stated Goal: Return home, increased strength PT Goal Formulation: With patient Time For Goal Achievement: 07/31/19 Potential to Achieve Goals: Good Progress towards PT goals: Progressing toward goals    Frequency    Min 3X/week      PT Plan Current plan remains appropriate    Co-evaluation              AM-PAC PT "6 Clicks" Mobility   Outcome Measure  Help needed turning from your back to your side while in a flat bed without using bedrails?: None Help needed moving from lying on your back to sitting on the side of a flat bed without using bedrails?: None Help needed moving to and from a bed to a chair (including a wheelchair)?: A Little Help needed standing up from a chair using your arms (e.g., wheelchair or bedside chair)?: None Help needed to walk in hospital room?: A Little Help needed climbing 3-5 steps with a railing? : A Little 6 Click Score: 21    End of Session   Activity Tolerance: Patient tolerated treatment well;Patient limited by fatigue Patient left: in chair;with call bell/phone within reach;with chair alarm set Nurse Communication: Mobility status PT Visit Diagnosis: Other abnormalities of gait and mobility (R26.89)     Time: 1020-1045 PT Time Calculation (min) (ACUTE ONLY): 25 min  Charges:  $Therapeutic Exercise: 8-22 mins $Therapeutic Activity: 8-22 mins                     Mabeline Caras, PT, DPT Acute Rehabilitation Services  Pager [phone] Office [place] 07/18/2019, 12:18 PM

## 2019-07-18 NOTE — Progress Notes (Signed)
PROGRESS NOTE  Bobby Vega VOJ:[id number] DOB: 1934/12/20   PCP: Lin Landsman, MD  Patient is from: home  DOA: 07/15/2019 LOS: 3  Brief Narrative / Interim history: 85 year old male with history of HTN, CVA, PVD with aortofemoral bypass on DAPT and BPH presenting with rectal bleed/hematochezia with progressive weakness for about 2 weeks.  Had presyncope on the day of presentation.  In ED, COVID-19 and influenza PCR negative.  RR 24.  HDS.  98% on RA.  Hgb 5.2.  MCV 106.  K5.3. Cr 6.06.  BUN 46.  Bicarb 17.  Anemia panel normal.  CXR not impressive.  Two units of pRBC ordered.  GI consulted for GI bleed.  Nephrology consulted for AKI/CKD.  Patient had urethral dilation and Foley catheter placement by urology on 07/14/2018.  Subjective: Reportedly had 2 bloody bowel movements overnight.  Somewhat sleepy this morning but no complaints.  He denies chest pain, dyspnea, dizziness, GI or UTI symptoms.  Objective: Vitals:   07/17/19 0727 07/17/19 1555 07/17/19 2216 07/18/19 0713  BP: 134/75 (!) 141/70 (!) 164/76 132/78  Pulse: 75 82 89 (!) 57  Resp: 16 16 20 16   Temp: 98.3 F (36.8 C) 98.7 F (37.1 C) 98.5 F (36.9 C) (!) 97.4 F (36.3 C)  TempSrc: Oral Oral Oral Oral  SpO2: 97% 90% 92% 99%  Weight:        Intake/Output Summary (Last 24 hours) at 07/18/2019 1140 Last data filed at 07/18/2019 0500 Gross per 24 hour  Intake 592.91 ml  Output 750 ml  Net -157.09 ml   Filed Weights   07/16/19 0440  Weight: 71 kg    Examination:  GENERAL: No apparent distress.  Sleepy but awakes to voice easily. HEENT: MMM.  Vision and hearing grossly intact.  NECK: Supple.  No apparent JVD.  RESP:  No IWOB. Good air movement bilaterally. CVS:  RRR. Heart sounds normal.  ABD/GI/GU: Bowel sounds present. Soft. Non tender.  MSK/EXT:  Moves extremities. No apparent deformity. No edema.  SKIN: no apparent skin lesion or wound NEURO: Sleepy but wakes to voice easily.  Fairly oriented.  No  apparent focal neuro deficit. PSYCH: Calm. Normal affect.   Procedures:  1/19-indwelling Foley catheter by urology.  Assessment & Plan: Acute blood loss anemia due to GI bleed: presents with painless hematochezia concerning for diverticular bleed.  Anemia panel normal.  Hemodynamically stable now. -Hgb 11.8 (04/2016)> 5.7 (admit)>2u> 8.4> 8.1> 7.5> 7.6 -Received IV Feraheme and ESA on 1/20. -Monitor H&H. -GI following  Acute on CKD-4: Cr 2.51 (04/2016)> 6.06 (admit)> 7.29>> 5.51.  Renal ultrasound consistent with CKD but no hydro or renal obstruction.  I and O incomplete.  About 750 cc UOP charted overnight. -Nephrology managing. -Continue monitoring -Avoid nephrotoxic meds.  Hypernatremia: Resolved. -Discontinue D5.  Non-anion gap metabolic acidosis: Likely due to renal failure. -Per nephrology.  BPH/urethral stricture: Foley catheter placed by urology on 1/19. -Plan to continue Foley catheter for 1 week from 1/19 -Received CTX prophylactically for 3 days.  Presyncope: Likely due to #1.  Echo as below. -Monitor anemia as above  Acute systolic CHF: Echo with EF of 40 to 45%, G2DD, RWMA, mod LAE and RVSP to 47.  Not on diuretics at home.  Appears euvolemic on exam.  About 750 cc UOP overnight. -Continue monitoring fluid status -May consider involving cardiology once renal function improves.  History of CVA: Stable. -Continue home aspirin and a statin.  Essential hypertension: Normotensive for most part. -Continue holding amlodipine  History of  PVD with or for femoral bypass on DAPT -Plavix on hold for possible GI work-up/scope -Continue aspirin and statin.  Hyperkalemia: Likely due to renal failure.  Resolved.  Thrombocytopenia: too early for HIT.  Improving. -Continue monitoring.  Debility/physical deconditioning -PT/OT eval  Mild leukocytosis/bandemia: Likely demargination. -Continue monitoring.  Chronic pain: -Continue low-dose Lyrica. -Discontinue  Zanaflex.               DVT prophylaxis: SCD in setting of GI bleed Code Status: Full code Family Communication: Updated patient's daughter over the phone on 1/21.Marland Kitchen Disposition Plan: Remains inpatient due to GI bleed and AKI Consultants: GI, nephrology, urology   Microbiology summarized: 1/19-influenza PCR negative. 1/19-COVID-19 negative.  Sch Meds:  Scheduled Meds: . aspirin EC  81 mg Oral Daily  . atorvastatin  20 mg Oral Daily  . Chlorhexidine Gluconate Cloth  6 each Topical Daily  . lidocaine  1 application Urethral Once  . pantoprazole  40 mg Oral Q0600  . pregabalin  75 mg Oral Daily  . sodium bicarbonate  1,300 mg Oral BID  . tiZANidine  2 mg Oral BID  . [START ON 07/20/2019] Vitamin D (Ergocalciferol)  50,000 Units Oral Q Sun   Continuous Infusions: . dextrose 75 mL/hr at 07/17/19 2237   PRN Meds:.acetaminophen **OR** acetaminophen, albuterol, hydrALAZINE, ondansetron **OR** ondansetron (ZOFRAN) IV  Antimicrobials: Anti-infectives (From admission, onward)   Start     Dose/Rate Route Frequency Ordered Stop   07/16/19 0100  cefTRIAXone (ROCEPHIN) 1 g in sodium chloride 0.9 % 100 mL IVPB     1 g 200 mL/hr over 30 Minutes Intravenous Every 24 hours 07/16/19 0048 07/18/19 0245       I have personally reviewed the following labs and images: CBC: Recent Labs  Lab 07/15/19 1003 07/15/19 2058 07/16/19 0738 07/16/19 1647 07/17/19 0246 07/17/19 1629 07/18/19 0208  WBC 10.3  --   --   --  12.3*  --  11.8*  NEUTROABS 7.7  --   --   --   --   --   --   HGB 5.7*   < > 8.1* 9.0* 7.5* 7.4* 7.6*  HCT 19.2*   < > 26.4* 29.0* 24.9* 23.3* 25.0*  MCV 106.1*  --   --   --  92.2  --  91.9  PLT 182  --   --   --  114*  --  128*   < > = values in this interval not displayed.   BMP &GFR Recent Labs  Lab 07/15/19 1003 07/15/19 2058 07/16/19 0234 07/17/19 0246 07/18/19 0208  NA 144 142 141 146* 138  K 5.3* 5.2* 5.1 4.9 4.5  CL 115* 115* 114* 115* 107  CO2 17*  16* 17* 19* 22  GLUCOSE 114* 100* 95 94 103*  BUN 46* 47* 47* 48* 43*  CREATININE 6.06* 7.29* 5.94* 5.76* 5.51*  CALCIUM 8.9 8.6* 8.2* 8.4* 8.1*  MG  --   --   --  2.4 2.4  PHOS  --   --   --  5.5* 4.9*   CrCl cannot be calculated (Unknown ideal weight.). Liver & Pancreas: Recent Labs  Lab 07/15/19 1003 07/17/19 0246 07/18/19 0208  AST 13*  --   --   ALT 11  --   --   ALKPHOS 104  --   --   BILITOT 0.6  --   --   PROT 6.5  --   --   ALBUMIN 3.6 3.1* 2.9*   Recent  Labs  Lab 07/15/19 1003  LIPASE 50   No results for input(s): AMMONIA in the last 168 hours. Diabetic: No results for input(s): HGBA1C in the last 72 hours. No results for input(s): GLUCAP in the last 168 hours. Cardiac Enzymes: No results for input(s): CKTOTAL, CKMB, CKMBINDEX, TROPONINI in the last 168 hours. No results for input(s): PROBNP in the last 8760 hours. Coagulation Profile: Recent Labs  Lab 07/15/19 1003  INR 1.3*   Thyroid Function Tests: No results for input(s): TSH, T4TOTAL, FREET4, T3FREE, THYROIDAB in the last 72 hours. Lipid Profile: No results for input(s): CHOL, HDL, LDLCALC, TRIG, CHOLHDL, LDLDIRECT in the last 72 hours. Anemia Panel: Recent Labs    07/15/19 2058  VITAMINB12 262  FOLATE 7.3  FERRITIN 24  TIBC 315  IRON 61  RETICCTPCT 2.7   Urine analysis:    Component Value Date/Time   COLORURINE YELLOW 05/17/2013 0530   APPEARANCEUR CLOUDY (A) 05/17/2013 0530   LABSPEC 1.012 05/17/2013 0530   PHURINE 5.0 05/17/2013 0530   GLUCOSEU NEGATIVE 05/17/2013 0530   HGBUR NEGATIVE 05/17/2013 0530   BILIRUBINUR NEGATIVE 05/17/2013 0530   KETONESUR NEGATIVE 05/17/2013 0530   PROTEINUR 30 (A) 05/17/2013 0530   UROBILINOGEN 0.2 05/17/2013 0530   NITRITE NEGATIVE 05/17/2013 0530   LEUKOCYTESUR MODERATE (A) 05/17/2013 0530   Sepsis Labs: Invalid input(s): PROCALCITONIN, [place]  Microbiology: Recent Results (from the past 240 hour(s))  Respiratory Panel by RT PCR (Flu  A&B, Covid) - Nasopharyngeal Swab     Status: None   Collection Time: 07/15/19 10:38 AM   Specimen: Nasopharyngeal Swab  Result Value Ref Range Status   SARS Coronavirus 2 by RT PCR NEGATIVE NEGATIVE Final    Comment: (NOTE) SARS-CoV-2 target nucleic acids are NOT DETECTED. The SARS-CoV-2 RNA is generally detectable in upper respiratoy specimens during the acute phase of infection. The lowest concentration of SARS-CoV-2 viral copies this assay can detect is 131 copies/mL. A negative result does not preclude SARS-Cov-2 infection and should not be used as the sole basis for treatment or other patient management decisions. A negative result may occur with  improper specimen collection/handling, submission of specimen other than nasopharyngeal swab, presence of viral mutation(s) within the areas targeted by this assay, and inadequate number of viral copies (<131 copies/mL). A negative result must be combined with clinical observations, patient history, and epidemiological information. The expected result is Negative. Fact Sheet for Patients:  PinkCheek.be Fact Sheet for Healthcare Providers:  GravelBags.it This test is not yet ap proved or cleared by the [place] FDA and  has been authorized for detection and/or diagnosis of SARS-CoV-2 by FDA under an Emergency Use Authorization (EUA). This EUA will remain  in effect (meaning this test can be used) for the duration of the COVID-19 declaration under Section 564(b)(1) of the Act, 21 U.S.C. section 360bbb-3(b)(1), unless the authorization is terminated or revoked sooner.    Influenza A by PCR NEGATIVE NEGATIVE Final   Influenza B by PCR NEGATIVE NEGATIVE Final    Comment: (NOTE) The Xpert Xpress SARS-CoV-2/FLU/RSV assay is intended as an aid in  the diagnosis of influenza from Nasopharyngeal swab specimens and  should not be used as a sole basis for treatment. Nasal washings  and  aspirates are unacceptable for Xpert Xpress SARS-CoV-2/FLU/RSV  testing. Fact Sheet for Patients: PinkCheek.be Fact Sheet for Healthcare Providers: GravelBags.it This test is not yet approved or cleared by the [place] FDA and  has been authorized for detection and/or diagnosis of SARS-CoV-2  by  FDA under an Emergency Use Authorization (EUA). This EUA will remain  in effect (meaning this test can be used) for the duration of the  Covid-19 declaration under Section 564(b)(1) of the Act, 21  U.S.C. section 360bbb-3(b)(1), unless the authorization is  terminated or revoked. Performed at [facility], [address] [place], [place] [address]     Radiology Studies: No results found.    Jalessa Peyser T. Gentry  If 7PM-7AM, please contact night-coverage [email or web address] Password [facility] 07/18/2019, 11:40 AM

## 2019-07-18 NOTE — Progress Notes (Signed)
Occupational Therapy Treatment Patient Details Name: Bobby Vega MRN: [id number] DOB: [birth year] Today's Date: 07/18/2019    History of present illness Pt is an 85 y.o. male admitted 07/15/19 with rectal bleed, progressive weakness and presyncope; concern for diverticular bleed. Also with AKI on CKD4. (-) COVID-19. Plan for possible colonoscopy and EGD 1/22. PMH includes CHF, CVA, PVD, cervical spondylosis, arthritis.   OT comments  Patient completing mobility and transfers in room with min guard to min assist, but fatigues easily requiring multiple seated rest breaks. Min guard for grooming and min assist for toileting.  Patient slightly impulsive during session, requires cueing for safety awareness and pacing. Decreased recall noted during session as well.  Noted on RA with SpO2 84% after limited mobility in room, PLB techniques increased to 88% and requires 2L [place] for O2 to increase to 92-94%.  Bloody bowel movement during session.  Recommend further cognitive assessment. Will follow.    Follow Up Recommendations  Home health OT;Supervision - Intermittent;Other (comment)(increased HH aide hours )    Equipment Recommendations  None recommended by OT    Recommendations for Other Services      Precautions / Restrictions Precautions Precautions: Fall Restrictions Weight Bearing Restrictions: No       Mobility Bed Mobility Overal bed mobility: Modified Independent Bed Mobility: Supine to Sit           General bed mobility comments: OOB in recliner upon entry   Transfers Overall transfer level: Needs assistance Equipment used: Rolling walker (2 wheeled) Transfers: Sit to/from Stand Sit to Stand: Min guard         General transfer comment: cueing for hand placement and safety with poor recall, min guard for balance    Balance Overall balance assessment: Needs assistance Sitting-balance support: Feet supported;No upper extremity supported Sitting balance-Leahy Scale:  Fair     Standing balance support: Bilateral upper extremity supported;During functional activity;No upper extremity supported Standing balance-Leahy Scale: Fair Standing balance comment: able to stand statically without UE support, but preference to support                            ADL either performed or assessed with clinical judgement   ADL Overall ADL's : Needs assistance/impaired     Grooming: Min guard;Sitting Grooming Details (indicate cue type and reason): able to wash hands at sink, seated rest break after completion due to fatigue                  Toilet Transfer: Min Engineer, site Details (indicate cue type and reason): 3:1 over commode  Toileting- Clothing Manipulation and Hygiene: Minimal assistance;Sit to/from stand Toileting - Clothing Manipulation Details (indicate cue type and reason): clothing mgmt and safety     Functional mobility during ADLs: Min guard;Minimal assistance;Rolling walker;Cueing for safety;Cueing for sequencing General ADL Comments: patient with decreased awarenes and safety; cueing for RW mgmt and pacing; pt fatigues easily      Vision       Perception     Praxis      Cognition Arousal/Alertness: Awake/alert Behavior During Therapy: WFL for tasks assessed/performed;Flat affect Overall Cognitive Status: Impaired/Different from baseline Area of Impairment: Safety/judgement;Awareness;Problem solving;Memory                     Memory: Decreased short-term memory   Safety/Judgement: Decreased awareness of safety;Decreased awareness of deficits Awareness: Emergent Problem Solving: Slow processing;Decreased initiation;Difficulty sequencing;Requires verbal cues General Comments:  patient with decreased awareness to safety, slightly impulsive with mobility, and poor insight to deficits; no recall of PT session this AM         Exercises     Shoulder Instructions       General  Comments pt on RA upon entry, after mobility SpO2 checked 84% with PLB recovered to 88% and replaced O2 at 2L to increased to 92-94%; HR 80-90    Pertinent Vitals/ Pain       Pain Assessment: Faces Faces Pain Scale: Hurts a little bit Pain Location: generalized  Pain Descriptors / Indicators: Discomfort Pain Intervention(s): Monitored during session;Repositioned  Home Living                                          Prior Functioning/Environment              Frequency  Min 2X/week        Progress Toward Goals  OT Goals(current goals can now be found in the care plan section)  Progress towards OT goals: Progressing toward goals  Acute Rehab OT Goals Patient Stated Goal: Return home, increased strength OT Goal Formulation: With patient  Plan Discharge plan remains appropriate;Frequency remains appropriate    Co-evaluation                 AM-PAC OT "6 Clicks" Daily Activity     Outcome Measure   Help from another person eating meals?: None Help from another person taking care of personal grooming?: None Help from another person toileting, which includes using toliet, bedpan, or urinal?: A Lot Help from another person bathing (including washing, rinsing, drying)?: A Lot Help from another person to put on and taking off regular upper body clothing?: A Little Help from another person to put on and taking off regular lower body clothing?: A Lot 6 Click Score: 17    End of Session Equipment Utilized During Treatment: Rolling walker  OT Visit Diagnosis: Unsteadiness on feet (R26.81);Muscle weakness (generalized) (M62.81);Other symptoms and signs involving cognitive function;Pain Pain - part of body: (generalized )   Activity Tolerance Patient tolerated treatment well   Patient Left in chair;with call bell/phone within reach;with chair alarm set   Nurse Communication Mobility status        Time: 1130-1159 OT Time Calculation (min): 29  min  Charges: OT General Charges $OT Visit: 1 Visit OT Treatments $Self Care/Home Management : 23-37 mins  [place] Pager [phone] Office [phone]    Delight Stare 07/18/2019, 1:41 PM

## 2019-07-18 NOTE — Progress Notes (Signed)
Night shift RN reported pt had 3 bloody bowel movements overnight. RN will continue to monitor pt.

## 2019-07-18 NOTE — Progress Notes (Signed)
Admit: 07/15/2019 LOS: 3  69M with AoCKD4 (or more likely, progressive CKD5), symptomatic anemia with hematochezia  Subjective:  . Still having intermittent hematochezia, colonoscopy being planned . Working with physical therapy . Creatinine continues to slowly improve, K4.5, hemoglobin 7.6 . 0.8 L urine output using Foley catheter  01/21 0701 - 01/22 0700 In: 592.9 [I.V.:492.9; IV Piggyback:100] Out: 750 [Urine:750]  Filed Weights   07/16/19 0440  Weight: 71 kg    Scheduled Meds: . aspirin EC  81 mg Oral Daily  . atorvastatin  20 mg Oral Daily  . Chlorhexidine Gluconate Cloth  6 each Topical Daily  . lidocaine  1 application Urethral Once  . pantoprazole  40 mg Oral Q0600  . pregabalin  75 mg Oral Daily  . sodium bicarbonate  1,300 mg Oral BID  . tiZANidine  2 mg Oral BID  . [START ON 07/20/2019] Vitamin D (Ergocalciferol)  50,000 Units Oral Q Sun   Continuous Infusions: . dextrose 75 mL/hr at 07/17/19 2237   PRN Meds:.acetaminophen **OR** acetaminophen, albuterol, hydrALAZINE, ondansetron **OR** ondansetron (ZOFRAN) IV  Current Labs: reviewed  Results for Bobby Vega, Bobby Vega (MRN [id number]) as of 07/16/2019 13:46  Ref. Range 07/15/2019 20:58  Saturation Ratios Latest Ref Range: 17.9 - 39.5 % 19  Ferritin Latest Ref Range: 24 - 336 ng/mL 24    Physical Exam:  Blood pressure 132/78, pulse (!) 57, temperature (!) 97.4 F (36.3 C), temperature source Oral, resp. rate 16, weight 71 kg, SpO2 99 %. GEN: Elderly male, lying in bed, chronically ill-appearing ENT: NCAT EYES: EOMI, glasses on CV: Regular, normal S1 and S2 PULM: Clear bilaterally, normal work of breathing ABD: No significant tenderness, soft SKIN: No rashes or lesions EXT: No peripheral edema  A 1. Renal failure, favor progressive CKD, potentially having an acute component with his severe anemia.    Slowly improving from admission but remains with very low GFR.    Not uremic and no immed indication for HD.  Given  improvements in GFR from admission I think he is best suited by close follow-up in our office where we can see how he settles and further explore potential dialysis.  No further suggestions at the current time.  I will arrange close follow-up with Dr. Marval Regal.   2. ABLA, likely exacerbated by #1, improved; alsow ith IDA; status post ESA and Feraheme 1/20  3. LGIB, GI following, for colonoscopy 1/22 4. PVD on DAPT at admission history of aortobifemoral bypass 5. History of CVA ago 6. History of diastolic heart failure 7. History of BPH and urethral stricture requiring dilatation, req Foley with urology 8. Hypertension, mildly elevated currently 9. Metabolic acidosis on NaHCO3, improving  P . As above . Will arrange f/u at our office. Will s/o for now . Daily weights, Daily Renal Panel, Strict I/Os, Avoid nephrotoxins (NSAIDs, judicious IV Contrast)   Pearson Grippe MD 07/18/2019, 11:06 AM  Recent Labs  Lab 07/16/19 0234 07/17/19 0246 07/18/19 0208  NA 141 146* 138  K 5.1 4.9 4.5  CL 114* 115* 107  CO2 17* 19* 22  GLUCOSE 95 94 103*  BUN 47* 48* 43*  CREATININE 5.94* 5.76* 5.51*  CALCIUM 8.2* 8.4* 8.1*  PHOS  --  5.5* 4.9*   Recent Labs  Lab 07/15/19 1003 07/15/19 2058 07/17/19 0246 07/17/19 1629 07/18/19 0208  WBC 10.3  --  12.3*  --  11.8*  NEUTROABS 7.7  --   --   --   --   HGB 5.7*   < >  7.5* 7.4* 7.6*  HCT 19.2*   < > 24.9* 23.3* 25.0*  MCV 106.1*  --  92.2  --  91.9  PLT 182  --  114*  --  128*   < > = values in this interval not displayed.

## 2019-07-19 ENCOUNTER — Encounter (HOSPITAL_COMMUNITY)
Admission: EM | Disposition: A | Discharge status: Home Health | Payer: Self-pay | Source: Home / Self Care | Attending: Student

## 2019-07-19 ENCOUNTER — Encounter (HOSPITAL_COMMUNITY): Payer: Self-pay | Admitting: Anesthesiology

## 2019-07-19 LAB — CBC
HCT: 24.4 % — ABNORMAL LOW (ref 39.0–52.0)
Hemoglobin: 7.5 g/dL — ABNORMAL LOW (ref 13.0–17.0)
MCH: 28.4 pg (ref 26.0–34.0)
MCHC: 30.7 g/dL (ref 30.0–36.0)
MCV: 92.4 fL (ref 80.0–100.0)
Platelets: 138 10*3/uL — ABNORMAL LOW (ref 150–400)
RBC: 2.64 MIL/uL — ABNORMAL LOW (ref 4.22–5.81)
RDW: 19.8 % — ABNORMAL HIGH (ref 11.5–15.5)
WBC: 11.4 10*3/uL — ABNORMAL HIGH (ref 4.0–10.5)
nRBC: 1 % — ABNORMAL HIGH (ref 0.0–0.2)

## 2019-07-19 LAB — RENAL FUNCTION PANEL
Albumin: 3 g/dL — ABNORMAL LOW (ref 3.5–5.0)
Anion gap: 11 (ref 5–15)
BUN: 37 mg/dL — ABNORMAL HIGH (ref 8–23)
CO2: 21 mmol/L — ABNORMAL LOW (ref 22–32)
Calcium: 8.3 mg/dL — ABNORMAL LOW (ref 8.9–10.3)
Chloride: 107 mmol/L (ref 98–111)
Creatinine, Ser: 5.34 mg/dL — ABNORMAL HIGH (ref 0.61–1.24)
GFR calc Af Amer: 11 mL/min — ABNORMAL LOW (ref >60)
GFR calc non Af Amer: 9 mL/min — ABNORMAL LOW (ref >60)
Glucose, Bld: 86 mg/dL (ref 70–99)
Phosphorus: 5.5 mg/dL — ABNORMAL HIGH (ref 2.5–4.6)
Potassium: 4.4 mmol/L (ref 3.5–5.1)
Sodium: 139 mmol/L (ref 135–145)

## 2019-07-19 LAB — MAGNESIUM: Magnesium: 2.3 mg/dL (ref 1.7–2.4)

## 2019-07-19 SURGERY — CANCELLED PROCEDURE
Anesthesia: Monitor Anesthesia Care

## 2019-07-19 MED ORDER — POLYETHYLENE GLYCOL 3350 17 G PO PACK
102.0000 g | PACK | ORAL | Status: AC
  Administered 2019-07-19: 102 g via ORAL
  Filled 2019-07-19: qty 6

## 2019-07-19 SURGICAL SUPPLY — 21 items

## 2019-07-19 NOTE — Progress Notes (Signed)
Patient brought down to endoscopy for rescheduled colonoscopy, bowel movements brown soft and grainy, no liquid bowel movement noted. Discussed with Dr. Collene Mares, colonoscopy cancelled for today. Rescheduled for tomorrow, patient will need to drink golytely prep overnight, Dr. Collene Mares will place orders. Primary RN notified of this. Patient notified of this as well.

## 2019-07-19 NOTE — Progress Notes (Signed)
Patient drank 1/2 of moviprep bowel prep for colonoscopy 07/19/2019 at 0930. Bowel movements liquid and brown. Discussed this with Dr. Collene Mares. Verbal orders received for miralax 6- 17g doses this AM for additional bowel prep and colonoscopy rescheduled for 1200 today. Primary RN notified of this and need for patient to complete this by 1000.

## 2019-07-19 NOTE — Anesthesia Preprocedure Evaluation (Deleted)
Anesthesia Evaluation    Reviewed: Allergy & Precautions, [name] Chart, lab work & pertinent test results  History of Anesthesia Complications (+) PONV  Airway        Dental   Pulmonary neg pulmonary ROS, Current Smoker,           Cardiovascular hypertension, Pt. on medications and Pt. on home beta blockers + Peripheral Vascular Disease    IMPRESSIONS    1. Left ventricular ejection fraction, by visual estimation, is 40 to 45%. The left ventricle has mild to moderately decreased function. There is no left ventricular hypertrophy.  2. Basal inferolateral segment is abnormal.  3. Left ventricular diastolic parameters are consistent with Grade II diastolic dysfunction (pseudonormalization).  4. The left ventricle demonstrates regional wall motion abnormalities.  5. Global right ventricle has normal systolic function.The right ventricular size is normal. No increase in right ventricular wall thickness.  6. Left atrial size was moderately dilated.  7. Right atrial size was mildly dilated.  8. Moderate pleural effusion in the left lateral region.  9. The mitral valve is normal in structure. Mild mitral valve regurgitation. No evidence of mitral stenosis. 10. The tricuspid valve is normal in structure. 11. The tricuspid valve is normal in structure. Tricuspid valve regurgitation is mild. 12. The aortic valve is normal in structure. Aortic valve regurgitation is not visualized. No evidence of aortic valve sclerosis or stenosis. 13. The pulmonic valve was normal in structure. Pulmonic valve regurgitation is not visualized. 14. Moderately elevated pulmonary artery systolic pressure. 15. The tricuspid regurgitant velocity is 2.84 m/s, and with an assumed right atrial pressure of 15 mmHg, the estimated right ventricular systolic pressure is moderately elevated at 47.3 mmHg. 16. The inferior vena cava is dilated in size with <50% respiratory  variability, suggesting right atrial pressure of 15 mmHg.   Neuro/Psych Dementia CVA    GI/Hepatic Neg liver ROS, GERD  ,  Endo/Other  negative endocrine ROS  Renal/GU Renal disease     Musculoskeletal negative musculoskeletal ROS (+)   Abdominal   Peds  Hematology negative hematology ROS (+) anemia ,   Anesthesia Other Findings Day of surgery medications reviewed with the patient.  Reproductive/Obstetrics                             Anesthesia Physical Anesthesia Plan  ASA: III  Anesthesia Plan: MAC   Post-op Pain Management:    Induction:   PONV Risk Score and Plan: Ondansetron and Propofol infusion  Airway Management Planned: Natural Airway  Additional Equipment:   Intra-op Plan:   Post-operative Plan:   Informed Consent:   Plan Discussed with:   Anesthesia Plan Comments:         Anesthesia Quick Evaluation

## 2019-07-19 NOTE — Progress Notes (Signed)
Pt has colonoscopy scheduled for 8am tomorrow morning. Per Endo note, he needs to drink go lightly overnight. No orders in chart. Pt did have some Miralax this morning and 1/2 dose of moviprep last night. [place], NP notified.

## 2019-07-19 NOTE — Progress Notes (Signed)
Cross cover [facility] Subjective: Bobby Vega is a 85 year old male with a history of hypertension CVA peripheral vascular disease and aortofemoral bypass on DAPT and BPH who came to the hospital with anemia and progressive weakness. He was brought down to the endoscopy in today for a colonoscopy but there was solid stool noted after he had a BM and the procedure was therefore postponed till tomorrow so that we could give him some more time for a better cleanout  Objective: Vital signs in last 24 hours: Temp:  [97.4 F (36.3 C)-98.2 F (36.8 C)] 97.5 F (36.4 C) (01/23 1620) Pulse Rate:  [71-79] 78 (01/23 1620) Resp:  [14-18] 14 (01/23 1620) BP: (113-147)/(61-75) 132/75 (01/23 1620) SpO2:  [92 %-96 %] 92 % (01/23 1620) Last BM Date: 07/19/19  Intake/Output from previous day: 01/22 0701 - 01/23 0700 In: -  Out: 600 [Urine:600] Intake/Output this shift: No intake/output data recorded.  General appearance: cooperative, fatigued and no distress Resp: clear to auscultation bilaterally Cardio: regular rate and rhythm, S1, S2 normal, no murmur, click, rub or gallop GI: soft, non-tender; bowel sounds normal; no masses,  no organomegaly Extremities: extremities normal, atraumatic, no cyanosis or edema  Lab Results: Recent Labs    07/17/19 0246 07/17/19 0246 07/17/19 1629 07/18/19 0208 07/19/19 0540  WBC 12.3*  --   --  11.8* 11.4*  HGB 7.5*   < > 7.4* 7.6* 7.5*  HCT 24.9*   < > 23.3* 25.0* 24.4*  PLT 114*  --   --  128* 138*   < > = values in this interval not displayed.   BMET Recent Labs    07/17/19 0246 07/18/19 0208 07/19/19 0540  NA 146* 138 139  K 4.9 4.5 4.4  CL 115* 107 107  CO2 19* 22 21*  GLUCOSE 94 103* 86  BUN 48* 43* 37*  CREATININE 5.76* 5.51* 5.34*  CALCIUM 8.4* 8.1* 8.3*   LFT Recent Labs    07/19/19 0540  ALBUMIN 3.0*   Medications: I have reviewed the patient's current medications.  Assessment/Plan: 1) Acute blood loss anemia with  painless rectal bleeding ?diverticular disease.  Colonoscopy has been postponed till tomorrow for the medical recommendation made thereafter. 2) Acute on chronic kidney disease. 3) BPH/urethral stricture 4) Acute systolic congestive heart failure. 5) HTN/history of CVA/History of PVD   LOS: 4 days   [name] 07/19/2019, 8:09 PM

## 2019-07-19 NOTE — Progress Notes (Signed)
PROGRESS NOTE  Bobby Vega LXB:[id number] DOB: 10/12/1934   PCP: Lin Landsman, MD  Patient is from: home  DOA: 07/15/2019 LOS: 4  Brief Narrative / Interim history: 85 year old male with history of HTN, CVA, PVD with aortofemoral bypass on DAPT and BPH presenting with rectal bleed/hematochezia with progressive weakness for about 2 weeks.  Had presyncope on the day of presentation.  In ED, COVID-19 and influenza PCR negative.  RR 24.  HDS.  98% on RA.  Hgb 5.2.  MCV 106.  K5.3. Cr 6.06.  BUN 46.  Bicarb 17.  Anemia panel normal.  CXR not impressive.  Two units of pRBC ordered.  GI consulted for GI bleed.  Nephrology consulted for AKI/CKD.  Patient had urethral dilation and Foley catheter placement by urology on 07/14/2018.   Subjective: No major events overnight or this morning.  Sitting on bedside commode.  Reports pain from Foley catheter.  Denies chest pain or dyspnea.  Objective: Vitals:   07/18/19 0713 07/18/19 1521 07/18/19 2255 07/19/19 0833  BP: 132/78 128/69 113/61 (!) 147/62  Pulse: (!) 57 72 79 71  Resp: 16 16 18 16   Temp: (!) 97.4 F (36.3 C) (!) 97.5 F (36.4 C) 98.2 F (36.8 C) (!) 97.4 F (36.3 C)  TempSrc: Oral Oral Oral Oral  SpO2: 99% 97% 95% 96%  Weight:        Intake/Output Summary (Last 24 hours) at 07/19/2019 1210 Last data filed at 07/18/2019 2300 Gross per 24 hour  Intake --  Output 600 ml  Net -600 ml   Filed Weights   07/16/19 0440  Weight: 71 kg    Examination:  GENERAL: No acute distress.  Appears well.  HEENT: MMM.  Vision and hearing grossly intact.  NECK: Supple.  No apparent JVD.  RESP:  No IWOB. Good air movement bilaterally. CVS:  RRR. Heart sounds normal.  ABD/GI/GU: Bowel sounds present. Soft. Non tender.  Indwelling Foley.  Clear urine in urine bag. MSK/EXT:  Moves extremities. No apparent deformity. No edema.  SKIN: no apparent skin lesion or wound NEURO: Awake, alert and oriented fairly.  No apparent focal neuro  deficit. PSYCH: Calm. Normal affect.   Procedures:  1/19-indwelling Foley catheter by urology.  Assessment & Plan: Acute blood loss anemia due to GI bleed: presents with painless hematochezia concerning for diverticular bleed.  Anemia panel normal.  Hemodynamically stable now. -Hgb 11.8 (04/2016)> 5.7 (admit)>2u> 8.4> 8.1> 7.5 -Received IV Feraheme and ESA on 1/20. -Monitor H&H. -GI following-plan for colonoscopy today  Acute on CKD-4: Cr 2.51 (04/2016)> 6.06 (admit)> 7.29>> 5.34.  Renal ultrasound consistent with CKD but no hydro or renal obstruction.  I and O incomplete, 600 cc overnight with 1 unmeasured void..  -Nephrology managing. -Continue monitoring -Avoid nephrotoxic meds.  Hypernatremia: Resolved.  Non-anion gap metabolic acidosis: Likely due to renal failure. -Per nephrology.  BPH/urethral stricture: Foley catheter placed by urology on 1/19. -Plan to continue Foley catheter for 1 week from 1/19 -Received CTX prophylactically for 3 days.  Presyncope: Likely due to #1.  Echo as below. -Monitor anemia as above  Acute systolic CHF: Echo with EF of 40 to 45%, G2DD, RWMA, mod LAE and RVSP to 47.  Not on diuretics at home.  Appears euvolemic on exam.  I&O incomplete. -Continue monitoring fluid status -May consider involving cardiology once renal function improves.  History of CVA: Stable. -Continue home aspirin and a statin.  Essential hypertension: Normotensive for most part. -Continue holding amlodipine  History of PVD with  or for femoral bypass on DAPT -Plavix on hold for possible GI work-up/scope -Continue aspirin and statin.  Hyperkalemia: Likely due to renal failure.  Resolved.  Thrombocytopenia: too early for HIT.  Improving. -Continue monitoring.  Debility/physical deconditioning -PT/OT eval  Mild leukocytosis/bandemia: Likely demargination.  Stable. -Continue monitoring.  Chronic pain: -Continue low-dose Lyrica. -Discontinued Zanaflex.                DVT prophylaxis: SCD in setting of GI bleed Code Status: Full code Family Communication: Updated patient's daughter over the phone on 1/21. Disposition Plan: Remains inpatient due to GI bleed and AKI Consultants: GI, nephrology, urology   Microbiology summarized: 1/19-influenza PCR negative. 1/19-COVID-19 negative.  Sch Meds:  Scheduled Meds: . aspirin EC  81 mg Oral Daily  . atorvastatin  20 mg Oral Daily  . Chlorhexidine Gluconate Cloth  6 each Topical Daily  . lidocaine  1 application Urethral Once  . pantoprazole  40 mg Oral Q0600  . pregabalin  75 mg Oral Daily  . sodium bicarbonate  1,300 mg Oral BID  . [START ON 07/20/2019] Vitamin D (Ergocalciferol)  50,000 Units Oral Q Sun   Continuous Infusions:  PRN Meds:.acetaminophen **OR** acetaminophen, albuterol, hydrALAZINE, ondansetron **OR** ondansetron (ZOFRAN) IV  Antimicrobials: Anti-infectives (From admission, onward)   Start     Dose/Rate Route Frequency Ordered Stop   07/16/19 0100  cefTRIAXone (ROCEPHIN) 1 g in sodium chloride 0.9 % 100 mL IVPB     1 g 200 mL/hr over 30 Minutes Intravenous Every 24 hours 07/16/19 0048 07/18/19 0245       I have personally reviewed the following labs and images: CBC: Recent Labs  Lab 07/15/19 1003 07/15/19 2058 07/16/19 1647 07/17/19 0246 07/17/19 1629 07/18/19 0208 07/19/19 0540  WBC 10.3  --   --  12.3*  --  11.8* 11.4*  NEUTROABS 7.7  --   --   --   --   --   --   HGB 5.7*   < > 9.0* 7.5* 7.4* 7.6* 7.5*  HCT 19.2*   < > 29.0* 24.9* 23.3* 25.0* 24.4*  MCV 106.1*  --   --  92.2  --  91.9 92.4  PLT 182  --   --  114*  --  128* 138*   < > = values in this interval not displayed.   BMP &GFR Recent Labs  Lab 07/15/19 2058 07/16/19 0234 07/17/19 0246 07/18/19 0208 07/19/19 0540  NA 142 141 146* 138 139  K 5.2* 5.1 4.9 4.5 4.4  CL 115* 114* 115* 107 107  CO2 16* 17* 19* 22 21*  GLUCOSE 100* 95 94 103* 86  BUN 47* 47* 48* 43* 37*  CREATININE 7.29*  5.94* 5.76* 5.51* 5.34*  CALCIUM 8.6* 8.2* 8.4* 8.1* 8.3*  MG  --   --  2.4 2.4 2.3  PHOS  --   --  5.5* 4.9* 5.5*   CrCl cannot be calculated (Unknown ideal weight.). Liver & Pancreas: Recent Labs  Lab 07/15/19 1003 07/17/19 0246 07/18/19 0208 07/19/19 0540  AST 13*  --   --   --   ALT 11  --   --   --   ALKPHOS 104  --   --   --   BILITOT 0.6  --   --   --   PROT 6.5  --   --   --   ALBUMIN 3.6 3.1* 2.9* 3.0*   Recent Labs  Lab 07/15/19 1003  LIPASE 50  No results for input(s): AMMONIA in the last 168 hours. Diabetic: No results for input(s): HGBA1C in the last 72 hours. No results for input(s): GLUCAP in the last 168 hours. Cardiac Enzymes: No results for input(s): CKTOTAL, CKMB, CKMBINDEX, TROPONINI in the last 168 hours. No results for input(s): PROBNP in the last 8760 hours. Coagulation Profile: Recent Labs  Lab 07/15/19 1003  INR 1.3*   Thyroid Function Tests: No results for input(s): TSH, T4TOTAL, FREET4, T3FREE, THYROIDAB in the last 72 hours. Lipid Profile: No results for input(s): CHOL, HDL, LDLCALC, TRIG, CHOLHDL, LDLDIRECT in the last 72 hours. Anemia Panel: No results for input(s): VITAMINB12, FOLATE, FERRITIN, TIBC, IRON, RETICCTPCT in the last 72 hours. Urine analysis:    Component Value Date/Time   COLORURINE YELLOW 05/17/2013 0530   APPEARANCEUR CLOUDY (A) 05/17/2013 0530   LABSPEC 1.012 05/17/2013 0530   PHURINE 5.0 05/17/2013 0530   GLUCOSEU NEGATIVE 05/17/2013 0530   HGBUR NEGATIVE 05/17/2013 0530   BILIRUBINUR NEGATIVE 05/17/2013 0530   KETONESUR NEGATIVE 05/17/2013 0530   PROTEINUR 30 (A) 05/17/2013 0530   UROBILINOGEN 0.2 05/17/2013 0530   NITRITE NEGATIVE 05/17/2013 0530   LEUKOCYTESUR MODERATE (A) 05/17/2013 0530   Sepsis Labs: Invalid input(s): PROCALCITONIN, [place]  Microbiology: Recent Results (from the past 240 hour(s))  Respiratory Panel by RT PCR (Flu A&B, Covid) - Nasopharyngeal Swab     Status: None   Collection  Time: 07/15/19 10:38 AM   Specimen: Nasopharyngeal Swab  Result Value Ref Range Status   SARS Coronavirus 2 by RT PCR NEGATIVE NEGATIVE Final    Comment: (NOTE) SARS-CoV-2 target nucleic acids are NOT DETECTED. The SARS-CoV-2 RNA is generally detectable in upper respiratoy specimens during the acute phase of infection. The lowest concentration of SARS-CoV-2 viral copies this assay can detect is 131 copies/mL. A negative result does not preclude SARS-Cov-2 infection and should not be used as the sole basis for treatment or other patient management decisions. A negative result may occur with  improper specimen collection/handling, submission of specimen other than nasopharyngeal swab, presence of viral mutation(s) within the areas targeted by this assay, and inadequate number of viral copies (<131 copies/mL). A negative result must be combined with clinical observations, patient history, and epidemiological information. The expected result is Negative. Fact Sheet for Patients:  PinkCheek.be Fact Sheet for Healthcare Providers:  GravelBags.it This test is not yet ap proved or cleared by the [place] FDA and  has been authorized for detection and/or diagnosis of SARS-CoV-2 by FDA under an Emergency Use Authorization (EUA). This EUA will remain  in effect (meaning this test can be used) for the duration of the COVID-19 declaration under Section 564(b)(1) of the Act, 21 U.S.C. section 360bbb-3(b)(1), unless the authorization is terminated or revoked sooner.    Influenza A by PCR NEGATIVE NEGATIVE Final   Influenza B by PCR NEGATIVE NEGATIVE Final    Comment: (NOTE) The Xpert Xpress SARS-CoV-2/FLU/RSV assay is intended as an aid in  the diagnosis of influenza from Nasopharyngeal swab specimens and  should not be used as a sole basis for treatment. Nasal washings and  aspirates are unacceptable for Xpert Xpress  SARS-CoV-2/FLU/RSV  testing. Fact Sheet for Patients: PinkCheek.be Fact Sheet for Healthcare Providers: GravelBags.it This test is not yet approved or cleared by the [place] FDA and  has been authorized for detection and/or diagnosis of SARS-CoV-2 by  FDA under an Emergency Use Authorization (EUA). This EUA will remain  in effect (meaning this test can be used)  for the duration of the  Covid-19 declaration under Section 564(b)(1) of the Act, 21  U.S.C. section 360bbb-3(b)(1), unless the authorization is  terminated or revoked. Performed at [facility], [place] [address], [place], [place] [address]     Radiology Studies: No results found.    Anshi Jalloh T. Celeste  If 7PM-7AM, please contact night-coverage [email or web address] Password [facility] 07/19/2019, 12:10 PM

## 2019-07-19 NOTE — Progress Notes (Signed)
Spoke with bedside RN about pt prep after additional order for Miralax.  RN stated stool clear with some sediment.  Pt brought down to Endoscopy for rescheduled colonoscopy.

## 2019-07-20 ENCOUNTER — Encounter (HOSPITAL_COMMUNITY)
Admission: EM | Disposition: A | Discharge status: Home Health | Payer: Self-pay | Source: Home / Self Care | Attending: Student

## 2019-07-20 ENCOUNTER — Inpatient Hospital Stay (HOSPITAL_COMMUNITY): Payer: Medicare HMO | Admitting: Anesthesiology

## 2019-07-20 ENCOUNTER — Encounter (HOSPITAL_COMMUNITY): Payer: Self-pay | Admitting: Internal Medicine

## 2019-07-20 HISTORY — PX: BIOPSY: SHX5522

## 2019-07-20 HISTORY — PX: COLONOSCOPY WITH PROPOFOL: SHX5780

## 2019-07-20 LAB — CBC
HCT: 25.4 % — ABNORMAL LOW (ref 39.0–52.0)
Hemoglobin: 8 g/dL — ABNORMAL LOW (ref 13.0–17.0)
MCH: 29.3 pg (ref 26.0–34.0)
MCHC: 31.5 g/dL (ref 30.0–36.0)
MCV: 93 fL (ref 80.0–100.0)
Platelets: 147 10*3/uL — ABNORMAL LOW (ref 150–400)
RBC: 2.73 MIL/uL — ABNORMAL LOW (ref 4.22–5.81)
RDW: 20 % — ABNORMAL HIGH (ref 11.5–15.5)
WBC: 9.8 10*3/uL (ref 4.0–10.5)
nRBC: 1 % — ABNORMAL HIGH (ref 0.0–0.2)

## 2019-07-20 LAB — RENAL FUNCTION PANEL
Albumin: 3 g/dL — ABNORMAL LOW (ref 3.5–5.0)
Anion gap: 11 (ref 5–15)
BUN: 35 mg/dL — ABNORMAL HIGH (ref 8–23)
CO2: 20 mmol/L — ABNORMAL LOW (ref 22–32)
Calcium: 8.5 mg/dL — ABNORMAL LOW (ref 8.9–10.3)
Chloride: 109 mmol/L (ref 98–111)
Creatinine, Ser: 5.01 mg/dL — ABNORMAL HIGH (ref 0.61–1.24)
GFR calc Af Amer: 12 mL/min — ABNORMAL LOW (ref >60)
GFR calc non Af Amer: 10 mL/min — ABNORMAL LOW (ref >60)
Glucose, Bld: 78 mg/dL (ref 70–99)
Phosphorus: 5.1 mg/dL — ABNORMAL HIGH (ref 2.5–4.6)
Potassium: 4.1 mmol/L (ref 3.5–5.1)
Sodium: 140 mmol/L (ref 135–145)

## 2019-07-20 LAB — MAGNESIUM: Magnesium: 2.2 mg/dL (ref 1.7–2.4)

## 2019-07-20 SURGERY — COLONOSCOPY WITH PROPOFOL
Anesthesia: Monitor Anesthesia Care

## 2019-07-20 MED ORDER — PROPOFOL 500 MG/50ML IV EMUL
INTRAVENOUS | Status: DC | PRN
  Administered 2019-07-20: 100 ug/kg/min via INTRAVENOUS

## 2019-07-20 MED ORDER — POLYETHYLENE GLYCOL 3350 17 G PO PACK
17.0000 g | PACK | ORAL | Status: AC
  Administered 2019-07-20 (×3): 17 g via ORAL
  Filled 2019-07-20: qty 1

## 2019-07-20 MED ORDER — PROPOFOL 10 MG/ML IV BOLUS
INTRAVENOUS | Status: DC | PRN
  Administered 2019-07-20: 20 mg via INTRAVENOUS

## 2019-07-20 MED ORDER — POLYETHYLENE GLYCOL 3350 17 G PO PACK
17.0000 g | PACK | ORAL | Status: DC
  Administered 2019-07-20: 17 g via ORAL
  Filled 2019-07-20: qty 1

## 2019-07-20 MED ORDER — SODIUM CHLORIDE 0.9 % IV SOLN
INTRAVENOUS | Status: AC | PRN
  Administered 2019-07-20: 1000 mL via INTRAVENOUS

## 2019-07-20 SURGICAL SUPPLY — 22 items

## 2019-07-20 NOTE — Transfer of Care (Signed)
Immediate Anesthesia Transfer of Care Note  Patient: Bobby Vega  Procedure(s) Performed: COLONOSCOPY WITH PROPOFOL (N/A )  Patient Location: Endoscopy Unit  Anesthesia Type:MAC  Level of Consciousness: awake  Airway & Oxygen Therapy: Patient Spontanous Breathing and Patient connected to nasal cannula oxygen  Post-op Assessment: Report given to RN and Post -op Vital signs reviewed and stable  Post vital signs: Reviewed and stable  Last Vitals:  Vitals Value Taken Time  BP    Temp    Pulse    Resp    SpO2      Last Pain:  Vitals:   07/20/19 1250  TempSrc: Oral  PainSc: 0-No pain      Patients Stated Pain Goal: 0 (30/09/23 3007)  Complications: No apparent anesthesia complications

## 2019-07-20 NOTE — Anesthesia Preprocedure Evaluation (Signed)
Anesthesia Evaluation  Patient identified by MRN, date of birth, ID band Patient awake    Reviewed: Allergy & Precautions, NPO status , [name] Chart, lab work & pertinent test results  Airway Mallampati: I  TM Distance: >3 FB Neck ROM: Full    Dental   Pulmonary Current Smoker,    Pulmonary exam normal        Cardiovascular hypertension, Pt. on medications Normal cardiovascular exam     Neuro/Psych Dementia CVA    GI/Hepatic GERD  Controlled and Medicated,  Endo/Other    Renal/GU Renal InsufficiencyRenal disease     Musculoskeletal   Abdominal   Peds  Hematology   Anesthesia Other Findings   Reproductive/Obstetrics                             Anesthesia Physical Anesthesia Plan  ASA: III  Anesthesia Plan: MAC   Post-op Pain Management:    Induction: Intravenous  PONV Risk Score and Plan: 0 and Treatment may vary due to age or medical condition  Airway Management Planned: Nasal Cannula  Additional Equipment:   Intra-op Plan:   Post-operative Plan:   Informed Consent: I have reviewed the patients History and Physical, chart, labs and discussed the procedure including the risks, benefits and alternatives for the proposed anesthesia with the patient or authorized representative who has indicated his/her understanding and acceptance.       Plan Discussed with: CRNA and Surgeon  Anesthesia Plan Comments:         Anesthesia Quick Evaluation

## 2019-07-20 NOTE — Progress Notes (Signed)
Last BM at 0241 was clear liquid no solids in stool.

## 2019-07-20 NOTE — TOC Initial Note (Signed)
Transition of Care [facility]) - Initial/Assessment Note    Patient Details  Name: Bobby Vega MRN: [id number] Date of Birth: [birth year]  [facility] CM/SW Contact:    Bobby Collet, RN Phone Number: 07/20/2019, 3:48 PM  Clinical Narrative:              Bobby Vega w patient to discuss DC plan, he deferred to daughter. Spoke w daughter Bobby Vega who would like [facility]. Patient has HHA through [place]/ Medicaid. Discussed [facility] providers and ratings, she would like to use [facility]. Referral placed to liaison and accepted. Patient has DME RW, Kasandra Knudsen, Civil engineer, contracting at home.       Expected Discharge Plan: [place] Barriers to Discharge: Continued Medical Work up   Patient Goals and CMS Choice Patient states their goals for this hospitalization and ongoing recovery are:: to go home CMS Medicare.gov Compare Post Acute Care list provided to:: Other (Comment Required) Choice offered to / list presented to : Adult Children  Expected Discharge Plan and Services Expected Discharge Plan: [place]     Post Acute Care Choice: Home Health                             HH Arranged: PT, OT, RN [facility] Agency: [place] Date [facility] Agency Contacted: 07/20/19 Time HH Agency Contacted: 61 Representative spoke with at [place]: Bobby Vega  Prior Living Arrangements/Services                       Activities of Daily Living      Permission Sought/Granted                  Emotional Assessment              Admission diagnosis:  Acute GI bleeding [K92.2] Lower GI bleed [K92.2] AKI (acute kidney injury) ([place]) [N17.9] Near syncope [R55] Patient Active Problem List   Diagnosis Date Noted  . Acute GI bleeding 07/15/2019  . Lower GI bleed 07/15/2019  . AKI (acute kidney injury) ([place]) 05/22/2016  . CKD (chronic kidney disease) stage 3, GFR 30-59 ml/min 05/22/2016  . Acute encephalopathy 05/22/2016  . Acute on chronic diastolic heart  failure ([place]) 05/22/2016  . Hypernatremia 05/22/2016  . Syncope 05/22/2016  . History of CVA with residual deficit   . Benign essential HTN   . Dementia without behavioral disturbance ([place])   . PVD (peripheral vascular disease) ([place])   . Diastolic dysfunction   . Urethral stricture   . Benign prostatic hyperplasia without lower urinary tract symptoms   . Leukocytosis   . Dysphagia   . Acute blood loss anemia   . Respiratory distress   . Acute on chronic renal failure ([place])   . Aspiration pneumonia of both lungs ([place]) 05/13/2016  . Acute hypoxemic respiratory failure ([place]) 05/13/2016  . Pain in limb 06/10/2014  . Numbness-Bilateral Shoulder-Hand 06/10/2014  . Hand weakness 06/04/2013  . Left arm weakness 06/04/2013  . CVA (cerebral vascular accident) ([place]) 05/17/2013  . Other and unspecified hyperlipidemia 05/16/2013  . GERD (gastroesophageal reflux disease)   . Hypertension   . Right arm weakness 05/15/2013  . Open wound of abdominal wall, lateral, without mention of complication 29/79/8921  . PVD (peripheral vascular disease) with claudication ([place]) 08/02/2011  . Atherosclerosis of native arteries of the extremities with intermittent claudication 07/10/2011  . Occlusion and stenosis of carotid  artery without mention of cerebral infarction 07/10/2011  . Peripheral vascular disease, unspecified ([place]) 06/26/2011   PCP:  Bobby Landsman, MD Pharmacy:   [facility] [place], [place] [address] [place] [address] Phone: [phone] Fax: [phone]     Social Determinants of Health (SDOH) Interventions    Readmission Risk Interventions No flowsheet data found.

## 2019-07-20 NOTE — H&P (View-Only) (Signed)
Per Collene Mares, MD give pt Miralax 17 gr every 30 min x 4, and make pt NPO afterwards.

## 2019-07-20 NOTE — Progress Notes (Signed)
Per Collene Mares, MD give pt Miralax 17 gr every 30 min x 4, and make pt NPO afterwards.

## 2019-07-20 NOTE — Interval H&P Note (Signed)
History and Physical Interval Note:  07/20/2019 1:07 PM  Bobby Vega  has presented today for surgery, with the diagnosis of Evaluate for rectal bleeding.  The various methods of treatment have been discussed with the patient and family. After consideration of risks, benefits and other options for treatment, the patient has consented to  Procedure(s): COLONOSCOPY WITH PROPOFOL (N/A) as a surgical intervention.  The patient's history has been reviewed, patient examined, no change in status, stable for surgery.  I have reviewed the patient's chart and labs.  Questions were answered to the patient's satisfaction.     [name]

## 2019-07-20 NOTE — Op Note (Signed)
[facility] Patient Name: Bobby Vega Procedure Date : 07/20/2019 MRN: [id number] Attending MD: [name] , MD Date of Birth: [birth year] CSN: [id number] Age: 85 Admit Type: Inpatient Procedure:                Colonoscopy with biopsies. Indications:              Rectal bleeding , ABLA, CRC screening for                            colorectal malignant neoplasm. Providers:                [name], MD, Carlyn Reichert, RN, Janeece Agee,                            Technician, Clearnce Sorrel, CRNA Referring MD:             THP Medicines:                Monitored Anesthesia Care Complications:            No immediate complications. Estimated Blood Loss:     Estimated blood loss was minimal. Procedure:                Pre-Anesthesia Assessment: - Prior to the                            procedure, a history and physical was performed,                            and patient medications and allergies were                            reviewed. The patient's tolerance of previous                            anesthesia was also reviewed. The risks and                            benefits of the procedure and the sedation options                            and risks were discussed with the patient. All                            questions were answered, and informed consent was                            obtained. Prior Anticoagulants: The patient has                            taken Plavix (clopidogrel), last dose was 5 days                            prior to procedure. ASA Grade Assessment: III - A  patient with severe systemic disease. After                            reviewing the risks and benefits, the patient was                            deemed in satisfactory condition to undergo the                            procedure. After obtaining informed consent, the                            colonoscope was passed under direct vision.     Throughout the procedure, the patient's blood                            pressure, pulse, and oxygen saturations were                            monitored continuously. The procedure was aborted                            in the transverse colon as the scope could not be                            advanced beyond the transverse colon. The                            colonoscopy was extremely difficult due to a                            redundant colon and abdominal pressure was applied.                            The patient tolerated the procedure well. The                            quality of the bowel preparation was adequate. The                            bowel preparation used was NulYTELY via split dose                            instruction. Scope In: 1:14:01 PM Scope Out: 2:00:44 PM Total Procedure Duration: 0 hours 46 minutes 43 seconds  Findings:      Multiple small and large-mouthed diverticula were found in the entire       colon; the colon was very redundant..      A large 12 mm sessile polyp was found in the distal transverse colon and       this was biopsied for pathology; I do not think this is causing the       patients blood in stool.      Prominent internal hemorrhoids were noted on retroflexion.      Inspite of  changing the patient's position from the left lateral to the       supine and again to the right lateral position, with application of       abdominal presuure, I was unable to intubate the right colon Impression:               - Diverticulosis in the entire examined colon.                           - One large flat polyp in the distal transverse                            colon-biopsied.                           - Prominent internal hemorrhoids.                           - Right colon and cecum not visualized.                           - Very redundant colon. Moderate Sedation:      MAC used. Recommendation:           - Clear liquid diet today.                            - Monitor serial CBC's.                           - Dr. Tarri Glenn to re-evaluate in AM. Procedure Code(s):        --- Professional ---                           [phone], Colonoscopy, flexible; with biopsy, single                            or multiple Diagnosis Code(s):        --- Professional ---                           K92.1, Melena (includes Hematochezia)                           D50.9, Iron deficiency anemia, unspecified                           K63.5, Polyp of colon                           Z12.11, Encounter for screening for malignant                            neoplasm of colon                           K57.30, Diverticulosis of large intestine without  perforation or abscess without bleeding CPT copyright 2019 American [employer]. All rights reserved. The codes documented in this report are preliminary and upon coder review may  be revised to meet current compliance requirements. [name], MD [name], MD 07/20/2019 2:22:42 PM This report has been signed electronically. Number of Addenda: 0

## 2019-07-20 NOTE — Progress Notes (Signed)
PROGRESS NOTE  Bobby Vega EUM:[id number] DOB: 1934/09/29   PCP: Lin Landsman, MD  Patient is from: home  DOA: 07/15/2019 LOS: 5  Brief Narrative / Interim history: 85 year old male with history of HTN, CVA, PVD with aortofemoral bypass on DAPT and BPH presenting with rectal bleed/hematochezia with progressive weakness for about 2 weeks.  Had presyncope on the day of presentation.  In ED, COVID-19 and influenza PCR negative.  RR 24.  HDS.  98% on RA.  Hgb 5.2.  MCV 106.  K5.3. Cr 6.06.  BUN 46.  Bicarb 17.  Anemia panel normal.  CXR not impressive.  Two units of pRBC ordered.  GI consulted for GI bleed.  Nephrology consulted for AKI/CKD.  Patient had urethral dilation and Foley catheter placement by urology on 07/14/2018.   Subjective: No major events overnight or this morning.  He complains of abdominal pain from drinking bowel prep.  No other complaints.  He denies chest pain or dyspnea.  Objective: Vitals:   07/19/19 2030 07/19/19 2343 07/20/19 0500 07/20/19 0806  BP: 121/68 139/72  (!) 148/74  Pulse: 71 (!) 56  64  Resp: 18 14 19 17   Temp: 97.6 F (36.4 C) 97.8 F (36.6 C)  97.9 F (36.6 C)  TempSrc: Oral     SpO2: 94% 93%  94%  Weight:        Intake/Output Summary (Last 24 hours) at 07/20/2019 1210 Last data filed at 07/19/2019 2300 Gross per 24 hour  Intake 240 ml  Output 550 ml  Net -310 ml   Filed Weights   07/16/19 0440  Weight: 71 kg    Examination:  GENERAL: No apparent distress.  Nontoxic. HEENT: MMM.  Vision and hearing grossly intact.  NECK: Supple.  No apparent JVD.  RESP:  No IWOB. Good air movement bilaterally. CVS:  RRR. Heart sounds normal.  ABD/GI/GU: Bowel sounds present. Soft. Non tender.  Indwelling Foley. MSK/EXT:  Moves extremities. No apparent deformity. No edema.  SKIN: no apparent skin lesion or wound NEURO: Awake, alert and oriented appropriately.  No apparent focal neuro deficit. PSYCH: Calm. Normal affect.  Procedures:    1/19-indwelling Foley catheter by urology.  Assessment & Plan: Acute blood loss anemia due to GI bleed: presents with painless hematochezia concerning for diverticular bleed.  Anemia panel normal.  Hemodynamically stable now. -Hgb 11.8 (04/2016)> 5.7 (admit)>2u> 8.4> 8.1> 7.5> 8.0 -Received IV Feraheme and ESA on 1/20. -Monitor H&H. -GI following-plan for colonoscopy today  Acute on CKD-4: Cr 2.51 (04/2016)> 6.06 (admit)> 7.29>> 5.01.  Renal ultrasound consistent with CKD but no hydro or renal obstruction.  I and O incomplete, 550 cc overnight with 1 unmeasured void. -Nephrology signed off-will arrange outpatient follow-up. -Continue monitoring -Avoid nephrotoxic meds.  Hypernatremia: Resolved.  Non-anion gap metabolic acidosis: Likely due to renal failure. -Per nephrology.  BPH/urethral stricture: Foley catheter placed by urology on 1/19. -Plan to continue Foley catheter for 1 week from 1/19 -Received CTX prophylactically for 3 days.  Presyncope: Likely due to #1.  Echo as below. -Monitor anemia as above  Acute systolic CHF: Echo with EF of 40 to 45%, G2DD, RWMA, mod LAE and RVSP to 47.  Not on diuretics at home.  Appears euvolemic on exam.  I&O incomplete. -Continue monitoring fluid status -May consider involving cardiology once renal function improves.  History of CVA: Stable. -Continue home aspirin and a statin.  Essential hypertension: Normotensive for most part. -Continue holding amlodipine  History of PVD with or for femoral bypass on  DAPT -Plavix on hold for possible GI work-up/scope -Continue aspirin and statin.  Hyperkalemia: Likely due to renal failure.  Resolved.  Thrombocytopenia: too early for HIT.  Improving. -Continue monitoring.  Debility/physical deconditioning -PT/OT eval  Mild leukocytosis/bandemia: Likely demargination.  Resolved. -Continue monitoring.  Chronic pain: -Continue low-dose Lyrica. -Discontinued Zanaflex.                DVT prophylaxis: SCD in setting of GI bleed Code Status: Full code Family Communication: Patient and RN.  Available if any question. Disposition Plan: Remains inpatient due to GI bleed and AKI Consultants: GI, nephrology, urology   Microbiology summarized: 1/19-influenza PCR negative. 1/19-COVID-19 negative.  Sch Meds:  Scheduled Meds: . aspirin EC  81 mg Oral Daily  . atorvastatin  20 mg Oral Daily  . Chlorhexidine Gluconate Cloth  6 each Topical Daily  . lidocaine  1 application Urethral Once  . pantoprazole  40 mg Oral Q0600  . pregabalin  75 mg Oral Daily  . sodium bicarbonate  1,300 mg Oral BID  . Vitamin D (Ergocalciferol)  50,000 Units Oral Q Sun   Continuous Infusions:  PRN Meds:.acetaminophen **OR** acetaminophen, albuterol, hydrALAZINE, ondansetron **OR** ondansetron (ZOFRAN) IV  Antimicrobials: Anti-infectives (From admission, onward)   Start     Dose/Rate Route Frequency Ordered Stop   07/16/19 0100  cefTRIAXone (ROCEPHIN) 1 g in sodium chloride 0.9 % 100 mL IVPB     1 g 200 mL/hr over 30 Minutes Intravenous Every 24 hours 07/16/19 0048 07/18/19 0245       I have personally reviewed the following labs and images: CBC: Recent Labs  Lab 07/15/19 1003 07/15/19 2058 07/17/19 0246 07/17/19 1629 07/18/19 0208 07/19/19 0540 07/20/19 0444  WBC 10.3  --  12.3*  --  11.8* 11.4* 9.8  NEUTROABS 7.7  --   --   --   --   --   --   HGB 5.7*   < > 7.5* 7.4* 7.6* 7.5* 8.0*  HCT 19.2*   < > 24.9* 23.3* 25.0* 24.4* 25.4*  MCV 106.1*  --  92.2  --  91.9 92.4 93.0  PLT 182  --  114*  --  128* 138* 147*   < > = values in this interval not displayed.   BMP &GFR Recent Labs  Lab 07/16/19 0234 07/17/19 0246 07/18/19 0208 07/19/19 0540 07/20/19 0444  NA 141 146* 138 139 140  K 5.1 4.9 4.5 4.4 4.1  CL 114* 115* 107 107 109  CO2 17* 19* 22 21* 20*  GLUCOSE 95 94 103* 86 78  BUN 47* 48* 43* 37* 35*  CREATININE 5.94* 5.76* 5.51* 5.34* 5.01*  CALCIUM 8.2* 8.4*  8.1* 8.3* 8.5*  MG  --  2.4 2.4 2.3 2.2  PHOS  --  5.5* 4.9* 5.5* 5.1*   CrCl cannot be calculated (Unknown ideal weight.). Liver & Pancreas: Recent Labs  Lab 07/15/19 1003 07/17/19 0246 07/18/19 0208 07/19/19 0540 07/20/19 0444  AST 13*  --   --   --   --   ALT 11  --   --   --   --   ALKPHOS 104  --   --   --   --   BILITOT 0.6  --   --   --   --   PROT 6.5  --   --   --   --   ALBUMIN 3.6 3.1* 2.9* 3.0* 3.0*   Recent Labs  Lab 07/15/19 1003  LIPASE 50  No results for input(s): AMMONIA in the last 168 hours. Diabetic: No results for input(s): HGBA1C in the last 72 hours. No results for input(s): GLUCAP in the last 168 hours. Cardiac Enzymes: No results for input(s): CKTOTAL, CKMB, CKMBINDEX, TROPONINI in the last 168 hours. No results for input(s): PROBNP in the last 8760 hours. Coagulation Profile: Recent Labs  Lab 07/15/19 1003  INR 1.3*   Thyroid Function Tests: No results for input(s): TSH, T4TOTAL, FREET4, T3FREE, THYROIDAB in the last 72 hours. Lipid Profile: No results for input(s): CHOL, HDL, LDLCALC, TRIG, CHOLHDL, LDLDIRECT in the last 72 hours. Anemia Panel: No results for input(s): VITAMINB12, FOLATE, FERRITIN, TIBC, IRON, RETICCTPCT in the last 72 hours. Urine analysis:    Component Value Date/Time   COLORURINE YELLOW 05/17/2013 0530   APPEARANCEUR CLOUDY (A) 05/17/2013 0530   LABSPEC 1.012 05/17/2013 0530   PHURINE 5.0 05/17/2013 0530   GLUCOSEU NEGATIVE 05/17/2013 0530   HGBUR NEGATIVE 05/17/2013 0530   BILIRUBINUR NEGATIVE 05/17/2013 0530   KETONESUR NEGATIVE 05/17/2013 0530   PROTEINUR 30 (A) 05/17/2013 0530   UROBILINOGEN 0.2 05/17/2013 0530   NITRITE NEGATIVE 05/17/2013 0530   LEUKOCYTESUR MODERATE (A) 05/17/2013 0530   Sepsis Labs: Invalid input(s): PROCALCITONIN, [place]  Microbiology: Recent Results (from the past 240 hour(s))  Respiratory Panel by RT PCR (Flu A&B, Covid) - Nasopharyngeal Swab     Status: None   Collection  Time: 07/15/19 10:38 AM   Specimen: Nasopharyngeal Swab  Result Value Ref Range Status   SARS Coronavirus 2 by RT PCR NEGATIVE NEGATIVE Final    Comment: (NOTE) SARS-CoV-2 target nucleic acids are NOT DETECTED. The SARS-CoV-2 RNA is generally detectable in upper respiratoy specimens during the acute phase of infection. The lowest concentration of SARS-CoV-2 viral copies this assay can detect is 131 copies/mL. A negative result does not preclude SARS-Cov-2 infection and should not be used as the sole basis for treatment or other patient management decisions. A negative result may occur with  improper specimen collection/handling, submission of specimen other than nasopharyngeal swab, presence of viral mutation(s) within the areas targeted by this assay, and inadequate number of viral copies (<131 copies/mL). A negative result must be combined with clinical observations, patient history, and epidemiological information. The expected result is Negative. Fact Sheet for Patients:  PinkCheek.be Fact Sheet for Healthcare Providers:  GravelBags.it This test is not yet ap proved or cleared by the [place] FDA and  has been authorized for detection and/or diagnosis of SARS-CoV-2 by FDA under an Emergency Use Authorization (EUA). This EUA will remain  in effect (meaning this test can be used) for the duration of the COVID-19 declaration under Section 564(b)(1) of the Act, 21 U.S.C. section 360bbb-3(b)(1), unless the authorization is terminated or revoked sooner.    Influenza A by PCR NEGATIVE NEGATIVE Final   Influenza B by PCR NEGATIVE NEGATIVE Final    Comment: (NOTE) The Xpert Xpress SARS-CoV-2/FLU/RSV assay is intended as an aid in  the diagnosis of influenza from Nasopharyngeal swab specimens and  should not be used as a sole basis for treatment. Nasal washings and  aspirates are unacceptable for Xpert Xpress  SARS-CoV-2/FLU/RSV  testing. Fact Sheet for Patients: PinkCheek.be Fact Sheet for Healthcare Providers: GravelBags.it This test is not yet approved or cleared by the [place] FDA and  has been authorized for detection and/or diagnosis of SARS-CoV-2 by  FDA under an Emergency Use Authorization (EUA). This EUA will remain  in effect (meaning this test can be used)  for the duration of the  Covid-19 declaration under Section 564(b)(1) of the Act, 21  U.S.C. section 360bbb-3(b)(1), unless the authorization is  terminated or revoked. Performed at [facility], [place] [address] [place], [place] [address]     Radiology Studies: No results found.    Ngoc Daughtridge T. [place]  If 7PM-7AM, please contact night-coverage [email or web address] Password [facility] 07/20/2019, 12:10 PM

## 2019-07-21 ENCOUNTER — Encounter: Payer: Self-pay | Admitting: *Deleted

## 2019-07-21 DIAGNOSIS — K5731 Diverticulosis of large intestine without perforation or abscess with bleeding: Secondary | ICD-10-CM

## 2019-07-21 LAB — CBC
HCT: 27.2 % — ABNORMAL LOW (ref 39.0–52.0)
Hemoglobin: 8.5 g/dL — ABNORMAL LOW (ref 13.0–17.0)
MCH: 28.8 pg (ref 26.0–34.0)
MCHC: 31.3 g/dL (ref 30.0–36.0)
MCV: 92.2 fL (ref 80.0–100.0)
Platelets: 167 10*3/uL (ref 150–400)
RBC: 2.95 MIL/uL — ABNORMAL LOW (ref 4.22–5.81)
RDW: 20.3 % — ABNORMAL HIGH (ref 11.5–15.5)
WBC: 9.5 10*3/uL (ref 4.0–10.5)
nRBC: 0.9 % — ABNORMAL HIGH (ref 0.0–0.2)

## 2019-07-21 LAB — RENAL FUNCTION PANEL
Albumin: 3.1 g/dL — ABNORMAL LOW (ref 3.5–5.0)
Anion gap: 12 (ref 5–15)
BUN: 31 mg/dL — ABNORMAL HIGH (ref 8–23)
CO2: 19 mmol/L — ABNORMAL LOW (ref 22–32)
Calcium: 8.7 mg/dL — ABNORMAL LOW (ref 8.9–10.3)
Chloride: 109 mmol/L (ref 98–111)
Creatinine, Ser: 4.49 mg/dL — ABNORMAL HIGH (ref 0.61–1.24)
GFR calc Af Amer: 13 mL/min — ABNORMAL LOW (ref >60)
GFR calc non Af Amer: 11 mL/min — ABNORMAL LOW (ref >60)
Glucose, Bld: 75 mg/dL (ref 70–99)
Phosphorus: 4.8 mg/dL — ABNORMAL HIGH (ref 2.5–4.6)
Potassium: 4.1 mmol/L (ref 3.5–5.1)
Sodium: 140 mmol/L (ref 135–145)

## 2019-07-21 LAB — MAGNESIUM: Magnesium: 2.1 mg/dL (ref 1.7–2.4)

## 2019-07-21 MED ORDER — CARVEDILOL 3.125 MG PO TABS
3.1250 mg | ORAL_TABLET | Freq: Two times a day (BID) | ORAL | Status: DC
  Administered 2019-07-21: 3.125 mg via ORAL
  Filled 2019-07-21: qty 1

## 2019-07-21 NOTE — Plan of Care (Signed)

## 2019-07-21 NOTE — Progress Notes (Signed)
PROGRESS NOTE  Bobby Vega MHD:[id number] DOB: 01-12-1935   PCP: Lin Landsman, MD  Patient is from: home  DOA: 07/15/2019 LOS: 6  Brief Narrative / Interim history: 85 year old male with history of HTN, CVA, PVD with aortofemoral bypass on DAPT and BPH presenting with rectal bleed/hematochezia with progressive weakness for about 2 weeks.  Had presyncope on the day of presentation.  In ED, COVID-19 and influenza PCR negative.  RR 24.  HDS.  98% on RA.  Hgb 5.2.  MCV 106.  K5.3. Cr 6.06.  BUN 46.  Bicarb 17.  Anemia panel normal.  CXR not impressive.  Two units of pRBC ordered.  GI consulted for GI bleed.  Nephrology consulted for AKI/CKD.  Patient had urethral dilation and Foley catheter placement by urology on 07/14/2018.  Patient had colonoscopy on 1/24 that revealed diverticulosis in the entire examined colon although not able to visualize the right colon and cecum.  Colonoscopy also revealed prominent internal hemorrhoids and a large flat polyp in the distal transverse colon which has been biopsied.  Pathology pending.   Subjective: Was bradycardic to 30s and 40s overnight but not symptomatic.  He says he does not feel good but could not be specific.  He chest pain, shortness of breath, nausea, vomiting or abdominal pain.  He is not happy about clear liquid diet.  Objective: Vitals:   07/20/19 1442 07/20/19 2309 07/21/19 0121 07/21/19 0842  BP: (!) 143/52 131/73  (!) 148/57  Pulse:  76  (!) 41  Resp: 19 20 18 20   Temp:  98.2 F (36.8 C)  (!) 97.4 F (36.3 C)  TempSrc:      SpO2: 94% 96%  97%  Weight:      Height:        Intake/Output Summary (Last 24 hours) at 07/21/2019 1358 Last data filed at 07/21/2019 0500 Gross per 24 hour  Intake 200 ml  Output 900 ml  Net -700 ml   Filed Weights   07/16/19 0440 07/20/19 1250  Weight: 71 kg 71 kg    Examination:  GENERAL: No apparent distress.  Nontoxic. HEENT: MMM.  Vision and hearing grossly intact.  NECK: Supple.  No  apparent JVD.  RESP:  No IWOB. Good air movement bilaterally. CVS:  RRR. Heart sounds normal.  ABD/GI/GU: Bowel sounds present. Soft. Non tender.  Indwelling Foley MSK/EXT:  Moves extremities. No apparent deformity. No edema.  SKIN: no apparent skin lesion or wound NEURO: Awake, alert and oriented self, place and family name.  No apparent focal neuro deficit. PSYCH: Calm. Normal affect.   Procedures:  1/19-indwelling Foley catheter by urology.  Assessment & Plan: Acute blood loss anemia due to GI bleed: presents with painless hematochezia concerning for diverticular bleed.  Anemia panel normal.  Hemodynamically stable now. -Colonoscopy on 1/24 with diverticulosis in the visualized colon, colonic polyps and internal hemorrhoid. -Hgb 11.8 (04/2016)> 5.7 (admit)>2u> 8.4> 8.1> 7.5> 8.5 -Received IV Feraheme and ESA on 1/20. -Monitor H&H.  Acute on CKD-4: Cr 2.51 (04/2016)> 6.06 (admit)> 7.29>> 5.01> 4.49.  Renal ultrasound consistent with CKD but no hydro or renal obstruction.  I and O incomplete but 900 cc overnight.  -Nephrology signed off-will arrange outpatient follow-up. -Continue monitoring -Avoid nephrotoxic meds.  Hypernatremia: Resolved.  Non-anion gap metabolic acidosis: Likely due to renal failure.  Stable.  BPH/urethral stricture: Foley catheter placed by urology on 1/19. -Plan to continue Foley catheter for 1 week from 1/19 -Received CTX prophylactically for 3 days. -Voiding trial 1/26.  Presyncope:  Likely due to #1.  Echo as below. -Monitor anemia as above  Acute systolic CHF: Echo with EF of 40 to 45%, G2DD, RWMA, mod LAE and RVSP to 47.  Not on diuretics at home.  Appears euvolemic on exam.  I&O incomplete but 900 cc overnight. -Continue monitoring fluid status -May consider involving cardiology once renal function improves.  History of left parietal and occipital cortex CVA in 2014: Stable. History of PVD with or for femoral bypass in 2013.  He has been on  DAPT -Plavix on hold due to GI bleed. -Continue home aspirin and a statin.  Essential hypertension: Normotensive for most part. -Continue holding amlodipine  Sinus bradycardia: HR in 30s and 40s overnight.  Now in the 61s.  Telemetry reveals disease and bigeminy's. -Resume his home Coreg at low-dose.  Hyperkalemia: Likely due to renal failure.  Resolved.  Thrombocytopenia: too early for HIT.  Improving. -Continue monitoring.  Debility/physical deconditioning -PT/OT eval  Mild leukocytosis/bandemia: Likely demargination.  Resolved. -Continue monitoring.  Chronic pain: -Continue low-dose Lyrica. -Discontinued Zanaflex.               DVT prophylaxis: SCD in setting of GI bleed Code Status: Full code Family Communication: Patient and RN.  Available if any question. Disposition Plan: Remains inpatient due to GI bleed pending clearance by GI.  Consultants: GI, nephrology (off), urology   Microbiology summarized: 1/19-influenza PCR negative. 1/19-COVID-19 negative.  Sch Meds:  Scheduled Meds: . aspirin EC  81 mg Oral Daily  . atorvastatin  20 mg Oral Daily  . carvedilol  3.125 mg Oral BID WC  . Chlorhexidine Gluconate Cloth  6 each Topical Daily  . lidocaine  1 application Urethral Once  . pantoprazole  40 mg Oral Q0600  . pregabalin  75 mg Oral Daily  . sodium bicarbonate  1,300 mg Oral BID  . Vitamin D (Ergocalciferol)  50,000 Units Oral Q Sun   Continuous Infusions:  PRN Meds:.acetaminophen **OR** acetaminophen, albuterol, hydrALAZINE, ondansetron **OR** ondansetron (ZOFRAN) IV  Antimicrobials: Anti-infectives (From admission, onward)   Start     Dose/Rate Route Frequency Ordered Stop   07/16/19 0100  cefTRIAXone (ROCEPHIN) 1 g in sodium chloride 0.9 % 100 mL IVPB     1 g 200 mL/hr over 30 Minutes Intravenous Every 24 hours 07/16/19 0048 07/18/19 0245       I have personally reviewed the following labs and images: CBC: Recent Labs  Lab  07/15/19 1003 07/15/19 2058 07/17/19 0246 07/17/19 0246 07/17/19 1629 07/18/19 0208 07/19/19 0540 07/20/19 0444 07/21/19 0326  WBC 10.3  --  12.3*  --   --  11.8* 11.4* 9.8 9.5  NEUTROABS 7.7  --   --   --   --   --   --   --   --   HGB 5.7*   < > 7.5*   < > 7.4* 7.6* 7.5* 8.0* 8.5*  HCT 19.2*   < > 24.9*   < > 23.3* 25.0* 24.4* 25.4* 27.2*  MCV 106.1*  --  92.2  --   --  91.9 92.4 93.0 92.2  PLT 182  --  114*  --   --  128* 138* 147* 167   < > = values in this interval not displayed.   BMP &GFR Recent Labs  Lab 07/17/19 0246 07/18/19 0208 07/19/19 0540 07/20/19 0444 07/21/19 0326  NA 146* 138 139 140 140  K 4.9 4.5 4.4 4.1 4.1  CL 115* 107 107 109 109  CO2 19* 22 21* 20* 19*  GLUCOSE 94 103* 86 78 75  BUN 48* 43* 37* 35* 31*  CREATININE 5.76* 5.51* 5.34* 5.01* 4.49*  CALCIUM 8.4* 8.1* 8.3* 8.5* 8.7*  MG 2.4 2.4 2.3 2.2 2.1  PHOS 5.5* 4.9* 5.5* 5.1* 4.8*   Estimated Creatinine Clearance: 11.4 mL/min (A) (by C-G formula based on SCr of 4.49 mg/dL (H)). Liver & Pancreas: Recent Labs  Lab 07/15/19 1003 07/15/19 1003 07/17/19 0246 07/18/19 0208 07/19/19 0540 07/20/19 0444 07/21/19 0326  AST 13*  --   --   --   --   --   --   ALT 11  --   --   --   --   --   --   ALKPHOS 104  --   --   --   --   --   --   BILITOT 0.6  --   --   --   --   --   --   PROT 6.5  --   --   --   --   --   --   ALBUMIN 3.6   < > 3.1* 2.9* 3.0* 3.0* 3.1*   < > = values in this interval not displayed.   Recent Labs  Lab 07/15/19 1003  LIPASE 50   No results for input(s): AMMONIA in the last 168 hours. Diabetic: No results for input(s): HGBA1C in the last 72 hours. No results for input(s): GLUCAP in the last 168 hours. Cardiac Enzymes: No results for input(s): CKTOTAL, CKMB, CKMBINDEX, TROPONINI in the last 168 hours. No results for input(s): PROBNP in the last 8760 hours. Coagulation Profile: Recent Labs  Lab 07/15/19 1003  INR 1.3*   Thyroid Function Tests: No results for  input(s): TSH, T4TOTAL, FREET4, T3FREE, THYROIDAB in the last 72 hours. Lipid Profile: No results for input(s): CHOL, HDL, LDLCALC, TRIG, CHOLHDL, LDLDIRECT in the last 72 hours. Anemia Panel: No results for input(s): VITAMINB12, FOLATE, FERRITIN, TIBC, IRON, RETICCTPCT in the last 72 hours. Urine analysis:    Component Value Date/Time   COLORURINE YELLOW 05/17/2013 0530   APPEARANCEUR CLOUDY (A) 05/17/2013 0530   LABSPEC 1.012 05/17/2013 0530   PHURINE 5.0 05/17/2013 0530   GLUCOSEU NEGATIVE 05/17/2013 0530   HGBUR NEGATIVE 05/17/2013 0530   BILIRUBINUR NEGATIVE 05/17/2013 0530   KETONESUR NEGATIVE 05/17/2013 0530   PROTEINUR 30 (A) 05/17/2013 0530   UROBILINOGEN 0.2 05/17/2013 0530   NITRITE NEGATIVE 05/17/2013 0530   LEUKOCYTESUR MODERATE (A) 05/17/2013 0530   Sepsis Labs: Invalid input(s): PROCALCITONIN, [place]  Microbiology: Recent Results (from the past 240 hour(s))  Respiratory Panel by RT PCR (Flu A&B, Covid) - Nasopharyngeal Swab     Status: None   Collection Time: 07/15/19 10:38 AM   Specimen: Nasopharyngeal Swab  Result Value Ref Range Status   SARS Coronavirus 2 by RT PCR NEGATIVE NEGATIVE Final    Comment: (NOTE) SARS-CoV-2 target nucleic acids are NOT DETECTED. The SARS-CoV-2 RNA is generally detectable in upper respiratoy specimens during the acute phase of infection. The lowest concentration of SARS-CoV-2 viral copies this assay can detect is 131 copies/mL. A negative result does not preclude SARS-Cov-2 infection and should not be used as the sole basis for treatment or other patient management decisions. A negative result may occur with  improper specimen collection/handling, submission of specimen other than nasopharyngeal swab, presence of viral mutation(s) within the areas targeted by this assay, and inadequate number of viral copies (<131 copies/mL). A negative  result must be combined with clinical observations, patient history, and epidemiological  information. The expected result is Negative. Fact Sheet for Patients:  PinkCheek.be Fact Sheet for Healthcare Providers:  GravelBags.it This test is not yet ap proved or cleared by the [place] FDA and  has been authorized for detection and/or diagnosis of SARS-CoV-2 by FDA under an Emergency Use Authorization (EUA). This EUA will remain  in effect (meaning this test can be used) for the duration of the COVID-19 declaration under Section 564(b)(1) of the Act, 21 U.S.C. section 360bbb-3(b)(1), unless the authorization is terminated or revoked sooner.    Influenza A by PCR NEGATIVE NEGATIVE Final   Influenza B by PCR NEGATIVE NEGATIVE Final    Comment: (NOTE) The Xpert Xpress SARS-CoV-2/FLU/RSV assay is intended as an aid in  the diagnosis of influenza from Nasopharyngeal swab specimens and  should not be used as a sole basis for treatment. Nasal washings and  aspirates are unacceptable for Xpert Xpress SARS-CoV-2/FLU/RSV  testing. Fact Sheet for Patients: PinkCheek.be Fact Sheet for Healthcare Providers: GravelBags.it This test is not yet approved or cleared by the [place] FDA and  has been authorized for detection and/or diagnosis of SARS-CoV-2 by  FDA under an Emergency Use Authorization (EUA). This EUA will remain  in effect (meaning this test can be used) for the duration of the  Covid-19 declaration under Section 564(b)(1) of the Act, 21  U.S.C. section 360bbb-3(b)(1), unless the authorization is  terminated or revoked. Performed at [facility], [place] [address] [place], [place] [address]     Radiology Studies: No results found.    Shade Rivenbark T. [place]  If 7PM-7AM, please contact night-coverage [email or web address] Password [facility] 07/21/2019, 1:58 PM

## 2019-07-21 NOTE — Anesthesia Postprocedure Evaluation (Signed)
Anesthesia Post Note  Patient: Bobby Vega  Procedure(s) Performed: COLONOSCOPY WITH PROPOFOL (N/A )     Patient location during evaluation: PACU Anesthesia Type: MAC Level of consciousness: awake and alert Pain management: pain level controlled Vital Signs Assessment: post-procedure vital signs reviewed and stable Respiratory status: spontaneous breathing, nonlabored ventilation, respiratory function stable and patient connected to nasal cannula oxygen Cardiovascular status: stable and blood pressure returned to baseline Postop Assessment: no apparent nausea or vomiting Anesthetic complications: no    Last Vitals:  Vitals:   07/20/19 2309 07/21/19 0121  BP: 131/73   Pulse: 76   Resp: 20 18  Temp: 36.8 C   SpO2: 96%     Last Pain:  Vitals:   07/20/19 1442  TempSrc:   PainSc: 0-No pain                 Shelden Raborn DAVID

## 2019-07-21 NOTE — Progress Notes (Signed)
Daily Rounding Note  07/21/2019, 11:58 AM  LOS: 6 days   SUBJECTIVE:   Chief complaint: rectal bleeding. Anemia.        Hungry  OBJECTIVE:         Vital signs in last 24 hours:    Temp:  [97.4 F (36.3 C)-98.2 F (36.8 C)] 97.4 F (36.3 C) (01/25 0842) Pulse Rate:  [41-86] 41 (01/25 0842) Resp:  [11-20] 20 (01/25 0842) BP: (93-176)/(27-83) 148/57 (01/25 0842) SpO2:  [94 %-98 %] 97 % (01/25 0842) Weight:  [71 kg] 71 kg (01/24 1250) Last BM Date: 07/21/19 Filed Weights   07/16/19 0440 07/20/19 1250  Weight: 71 kg 71 kg   General: thin, pleasant, comfortable.  Frail and chronically ill looking   Heart: RRR Chest: clear but overall diminished BS.   Abdomen:  NT, ND.  Active BS Extremities: no CCE Neuro/Psych:  Alert, appropriate, no tremors or gross weakness.    Intake/Output from previous day: 01/24 0701 - 01/25 0700 In: 200 [I.V.:200] Out: 900 [Urine:900]  Intake/Output this shift: No intake/output data recorded.  Lab Results: Recent Labs    07/19/19 0540 07/20/19 0444 07/21/19 0326  WBC 11.4* 9.8 9.5  HGB 7.5* 8.0* 8.5*  HCT 24.4* 25.4* 27.2*  PLT 138* 147* 167   BMET Recent Labs    07/19/19 0540 07/20/19 0444 07/21/19 0326  NA 139 140 140  K 4.4 4.1 4.1  CL 107 109 109  CO2 21* 20* 19*  GLUCOSE 86 78 75  BUN 37* 35* 31*  CREATININE 5.34* 5.01* 4.49*  CALCIUM 8.3* 8.5* 8.7*   LFT Recent Labs    07/19/19 0540 07/20/19 0444 07/21/19 0326  ALBUMIN 3.0* 3.0* 3.1*   PT/INR No results for input(s): LABPROT, INR in the last 72 hours. Hepatitis Panel No results for input(s): HEPBSAG, HCVAB, HEPAIGM, HEPBIGM in the last 72 hours.  Studies/Results: No results found.   Scheduled Meds: . aspirin EC  81 mg Oral Daily  . atorvastatin  20 mg Oral Daily  . Chlorhexidine Gluconate Cloth  6 each Topical Daily  . lidocaine  1 application Urethral Once  . pantoprazole  40 mg Oral Q0600  .  pregabalin  75 mg Oral Daily  . sodium bicarbonate  1,300 mg Oral BID  . Vitamin D (Ergocalciferol)  50,000 Units Oral Q Sun   Continuous Infusions: PRN Meds:.acetaminophen **OR** acetaminophen, albuterol, hydrALAZINE, ondansetron **OR** ondansetron (ZOFRAN) IV   ASSESMENT:   *   Painless hematochezia x 2 weeks PTA.  Severe anemia.  Not iron, b12 or folate deficient.   Hgb 5.7 >> 2 PRBCs >> 8.5.  Feraheme on 07/16/19.   1/24 colonoscopy: 12 mm distal transverse polyp, biopsied but not removed.  Pan diverticulosis.  Internal rrhoids (suspect these are source of bleeding).  Unable to reach right colon, ileum due to redundant colon   *   Stage 5 CKD, contributing to anemia.  BUN/creat improved but still stage 5.   Started ESA per renal.  Renal plans close office fup w Dr Arty Baumgartner.    *   Chronic Plavix, last dose 1/19.    *   Urinary bladder outlet obstruction.    *   Thrombocytopenia, resolved.     PLAN   *   Await colon polyp pathology.  *   If recurrent rectal bleeding, add anusol HC supp PR  *   ? Should Plavix be permanently discontinued?  Azucena Freed  07/21/2019, 11:58 AM Phone [phone]

## 2019-07-21 NOTE — Progress Notes (Signed)
Occupational Therapy Treatment Patient Details Name: Bobby Vega MRN: [id number] DOB: [birth year] Today's Date: 07/21/2019    History of present illness Pt is an 85 y.o. male admitted 07/15/19 with rectal bleed, progressive weakness and presyncope; concern for diverticular bleed. Also with AKI on CKD4. (-) COVID-19. Plan for possible colonoscopy and EGD 1/22. PMH includes CHF, CVA, PVD, cervical spondylosis, arthritis.   OT comments  Patient progressing towards goals slowly.  Limited today by decreased activity tolerance.  Completing grooming at sink mainly seated with supervision, attempted initiating tasks standing but unable to tolerate >15 seconds standing before needing to sit.  Patient disoriented to time, difficulty sequencing and poor safety awareness; updated dc recommendations to 24/7 support as I question patient safety with dc home alone.  Will follow.    Follow Up Recommendations  Home health OT;Supervision/Assistance - 24 hour    Equipment Recommendations  None recommended by OT    Recommendations for Other Services      Precautions / Restrictions Precautions Precautions: Fall Restrictions Weight Bearing Restrictions: No       Mobility Bed Mobility Overal bed mobility: Needs Assistance Bed Mobility: Supine to Sit;Sit to Supine   Sidelying to sit: Min guard Supine to sit: Supervision Sit to supine: Supervision Sit to sidelying: Min guard General bed mobility comments: for safety, increased time and effort  Transfers Overall transfer level: Needs assistance Equipment used: Rolling walker (2 wheeled) Transfers: Sit to/from Stand Sit to Stand: Min guard         General transfer comment: increased time, cueing for hand placement (with poor carryover) and safety    Balance Overall balance assessment: Needs assistance Sitting-balance support: Feet supported;No upper extremity supported Sitting balance-Leahy Scale: Fair     Standing balance support:  Bilateral upper extremity supported;No upper extremity supported;During functional activity Standing balance-Leahy Scale: Fair Standing balance comment: relaint on BUE support dynamically, preference to UE suppor standing during grooming                           ADL either performed or assessed with clinical judgement   ADL Overall ADL's : Needs assistance/impaired     Grooming: Supervision/safety;Sitting;Min guard;Standing Grooming Details (indicate cue type and reason): initated standing but quickly transitioned to stiting due to fatiigue; min guard in standing                  Toilet Transfer: Min guard;Ambulation;RW;BSC   Toileting- Water quality scientist and Hygiene: Min guard;Sit to/from stand       Functional mobility during ADLs: Min guard;Supervision/safety;Rolling walker;Cueing for sequencing;Cueing for safety General ADL Comments: pt signifincantly limited by decreased activity tolerance and questionable cognition (attempted short blessed test, but patient unable to engage)      Vision       Perception     Praxis      Cognition Arousal/Alertness: Awake/alert Behavior During Therapy: WFL for tasks assessed/performed Overall Cognitive Status: Impaired/Different from baseline Area of Impairment: Orientation;Attention;Following commands;Memory;Safety/judgement;Awareness;Problem solving                 Orientation Level: Disoriented to;Time Current Attention Level: Sustained Memory: Decreased short-term memory Following Commands: Follows one step commands consistently;Follows one step commands inconsistently;Follows multi-step commands inconsistently Safety/Judgement: Decreased awareness of safety;Decreased awareness of deficits Awareness: Emergent Problem Solving: Slow processing;Decreased initiation;Difficulty sequencing;Requires verbal cues General Comments: patient disoriented to time, follows 1 step commands with increaed time  butdifficulty sequencing multple step commands; poor safety awareness  Exercises     Shoulder Instructions       General Comments RA, VSS    Pertinent Vitals/ Pain       Pain Assessment: Faces Faces Pain Scale: No hurt  Home Living                                          Prior Functioning/Environment              Frequency  Min 2X/week        Progress Toward Goals  OT Goals(current goals can now be found in the care plan section)  Progress towards OT goals: Progressing toward goals  Acute Rehab OT Goals Patient Stated Goal: Return home, increased strength OT Goal Formulation: With patient  Plan Discharge plan remains appropriate;Frequency remains appropriate    Co-evaluation                 AM-PAC OT "6 Clicks" Daily Activity     Outcome Measure   Help from another person eating meals?: None Help from another person taking care of personal grooming?: None Help from another person toileting, which includes using toliet, bedpan, or urinal?: A Little Help from another person bathing (including washing, rinsing, drying)?: A Lot Help from another person to put on and taking off regular upper body clothing?: A Little Help from another person to put on and taking off regular lower body clothing?: A Lot 6 Click Score: 18    End of Session Equipment Utilized During Treatment: Rolling walker  OT Visit Diagnosis: Unsteadiness on feet (R26.81);Muscle weakness (generalized) (M62.81);Other symptoms and signs involving cognitive function;Pain   Activity Tolerance Patient limited by fatigue   Patient Left in bed;with call bell/phone within reach;with bed alarm set   Nurse Communication Mobility status        Time: 7076-1518 OT Time Calculation (min): 25 min  Charges: OT General Charges $OT Visit: 1 Visit OT Treatments $Self Care/Home Management : 23-37 mins  [place] Pager  [phone] Office [place] 07/21/2019, 10:43 AM

## 2019-07-21 NOTE — Progress Notes (Signed)
Physical Therapy Treatment Patient Details Name: Bobby Vega MRN: [id number] DOB: 1934-06-28 Today's Date: 07/21/2019    History of Present Illness Pt is an 85 y.o. male admitted 07/15/19 with rectal bleed, progressive weakness and presyncope; concern for diverticular bleed. Also with AKI on CKD4. (-) COVID-19. Plan for possible colonoscopy and EGD 1/22. PMH includes CHF, CVA, PVD, cervical spondylosis, arthritis.    PT Comments    Pt progressing well towards goals.  Pt with increased ambulation tolerance however continues to report 3/4 DOE/SOB with activity requiring seated/sidelying rest break. Pt unsafe to be home alone due to decreased safety awareness, increased falls risk, and impaired ability to care for safe safely.   Follow Up Recommendations  Home health PT;Supervision/Assistance - 24 hour     Equipment Recommendations  None recommended by PT    Recommendations for Other Services       Precautions / Restrictions Precautions Precautions: Fall Restrictions Weight Bearing Restrictions: No    Mobility  Bed Mobility Overal bed mobility: Modified Independent Bed Mobility: Sidelying to Sit;Sit to Sidelying   Sidelying to sit: Min guard     Sit to sidelying: Min guard General bed mobility comments: pt in R sidelying with LEs off EOB with c/o "I"m so tired"  Transfers Overall transfer level: Needs assistance Equipment used: Rolling walker (2 wheeled) Transfers: Sit to/from Stand Sit to Stand: Min guard         General transfer comment: increased time, verbal cues to push up from bed, pt steady  Ambulation/Gait Ambulation/Gait assistance: Min guard Gait Distance (Feet): 120 Feet Assistive device: Rolling walker (2 wheeled) Gait Pattern/deviations: Step-through pattern;Decreased stride length;Trunk flexed Gait velocity: dec Gait velocity interpretation: 1.31 - 2.62 ft/sec, indicative of limited community ambulator General Gait Details: pt agreed to hallway  ambulation, pt with 3/4 DOE, pt c/o SOB, pt with progressive trunk flexion and UE support/leaning onto walker. HR 103   Stairs             Wheelchair Mobility    Modified Rankin (Stroke Patients Only)       Balance Overall balance assessment: Needs assistance Sitting-balance support: Feet supported;No upper extremity supported Sitting balance-Leahy Scale: Fair     Standing balance support: Bilateral upper extremity supported;During functional activity;No upper extremity supported Standing balance-Leahy Scale: Fair Standing balance comment: able to stand statically without UE support, but preference to support                             Cognition Arousal/Alertness: Awake/alert Behavior During Therapy: WFL for tasks assessed/performed Overall Cognitive Status: Impaired/Different from baseline Area of Impairment: Safety/judgement;Awareness;Problem solving;Memory                     Memory: Decreased short-term memory   Safety/Judgement: Decreased awareness of safety;Decreased awareness of deficits   Problem Solving: Slow processing;Decreased initiation;Difficulty sequencing;Requires verbal cues General Comments: pt requiring max directional verbal cues as pt with no initiation      Exercises      General Comments General comments (skin integrity, edema, etc.): pt on RA, VSS      Pertinent Vitals/Pain Pain Assessment: Faces Faces Pain Scale: No hurt    Home Living                      Prior Function            PT Goals (current goals can now be found in  the care plan section) Acute Rehab PT Goals Patient Stated Goal: Return home, increased strength Progress towards PT goals: Progressing toward goals    Frequency    Min 3X/week      PT Plan Current plan remains appropriate    Co-evaluation              AM-PAC PT "6 Clicks" Mobility   Outcome Measure  Help needed turning from your back to your side while in  a flat bed without using bedrails?: None Help needed moving from lying on your back to sitting on the side of a flat bed without using bedrails?: None Help needed moving to and from a bed to a chair (including a wheelchair)?: A Little Help needed standing up from a chair using your arms (e.g., wheelchair or bedside chair)?: A Little Help needed to walk in hospital room?: A Little Help needed climbing 3-5 steps with a railing? : A Little 6 Click Score: 20    End of Session Equipment Utilized During Treatment: Gait belt Activity Tolerance: Patient tolerated treatment well;Patient limited by fatigue Patient left: in chair;with call bell/phone within reach;with chair alarm set Nurse Communication: Mobility status PT Visit Diagnosis: Other abnormalities of gait and mobility (R26.89)     Time: 6886-4847 PT Time Calculation (min) (ACUTE ONLY): 14 min  Charges:  $Gait Training: 8-22 mins                     Kittie Plater, PT, DPT Acute Rehabilitation Services Pager #: [phone] Office #: [phone]    Berline Lopes 07/21/2019, 10:16 AM

## 2019-07-22 ENCOUNTER — Encounter (HOSPITAL_COMMUNITY): Payer: Self-pay | Admitting: Internal Medicine

## 2019-07-22 ENCOUNTER — Other Ambulatory Visit: Payer: Self-pay | Admitting: Physician Assistant

## 2019-07-22 DIAGNOSIS — I420 Dilated cardiomyopathy: Secondary | ICD-10-CM

## 2019-07-22 DIAGNOSIS — R001 Bradycardia, unspecified: Secondary | ICD-10-CM

## 2019-07-22 DIAGNOSIS — I10 Essential (primary) hypertension: Secondary | ICD-10-CM

## 2019-07-22 DIAGNOSIS — I493 Ventricular premature depolarization: Secondary | ICD-10-CM

## 2019-07-22 LAB — RENAL FUNCTION PANEL
Albumin: 3.2 g/dL — ABNORMAL LOW (ref 3.5–5.0)
Anion gap: 11 (ref 5–15)
BUN: 28 mg/dL — ABNORMAL HIGH (ref 8–23)
CO2: 22 mmol/L (ref 22–32)
Calcium: 8.8 mg/dL — ABNORMAL LOW (ref 8.9–10.3)
Chloride: 110 mmol/L (ref 98–111)
Creatinine, Ser: 4.47 mg/dL — ABNORMAL HIGH (ref 0.61–1.24)
GFR calc Af Amer: 13 mL/min — ABNORMAL LOW (ref >60)
GFR calc non Af Amer: 11 mL/min — ABNORMAL LOW (ref >60)
Glucose, Bld: 101 mg/dL — ABNORMAL HIGH (ref 70–99)
Phosphorus: 4.1 mg/dL (ref 2.5–4.6)
Potassium: 4.1 mmol/L (ref 3.5–5.1)
Sodium: 143 mmol/L (ref 135–145)

## 2019-07-22 LAB — CBC
HCT: 28.1 % — ABNORMAL LOW (ref 39.0–52.0)
Hemoglobin: 8.8 g/dL — ABNORMAL LOW (ref 13.0–17.0)
MCH: 29 pg (ref 26.0–34.0)
MCHC: 31.3 g/dL (ref 30.0–36.0)
MCV: 92.7 fL (ref 80.0–100.0)
Platelets: 185 10*3/uL (ref 150–400)
RBC: 3.03 MIL/uL — ABNORMAL LOW (ref 4.22–5.81)
RDW: 20.9 % — ABNORMAL HIGH (ref 11.5–15.5)
WBC: 9.9 10*3/uL (ref 4.0–10.5)
nRBC: 1.1 % — ABNORMAL HIGH (ref 0.0–0.2)

## 2019-07-22 LAB — MAGNESIUM: Magnesium: 2.3 mg/dL (ref 1.7–2.4)

## 2019-07-22 MED ORDER — DARBEPOETIN ALFA 60 MCG/0.3ML IJ SOSY
60.0000 ug | PREFILLED_SYRINGE | INTRAMUSCULAR | Status: DC
  Filled 2019-07-22: qty 0.3

## 2019-07-22 MED ORDER — AMLODIPINE BESYLATE 10 MG PO TABS
10.0000 mg | ORAL_TABLET | Freq: Every day | ORAL | Status: DC
  Administered 2019-07-22 – 2019-07-23 (×2): 10 mg via ORAL
  Filled 2019-07-22 (×2): qty 1

## 2019-07-22 MED ORDER — CLOPIDOGREL BISULFATE 75 MG PO TABS
75.0000 mg | ORAL_TABLET | Freq: Every day | ORAL | Status: DC
  Administered 2019-07-22 – 2019-07-23 (×2): 75 mg via ORAL
  Filled 2019-07-22 (×2): qty 1

## 2019-07-22 NOTE — Progress Notes (Signed)
Pt to have a 30-day event monitor and then f/u with Dr Radford Pax.  Monitor ordered and message sent to the office to schedule it.  Rosaria Ferries, PA-C 07/22/2019 9:06 PM

## 2019-07-22 NOTE — Progress Notes (Signed)
Foley discontinued per MD order

## 2019-07-22 NOTE — Progress Notes (Signed)
Physical Therapy Treatment Patient Details Name: Bobby Vega MRN: [id number] DOB: 1934-08-06 Today's Date: 07/22/2019    History of Present Illness Pt is an 85 y.o. male admitted 07/15/19 with rectal bleed, progressive weakness and presyncope; concern for diverticular bleed. Also with AKI on CKD4. (-) COVID-19. Plan for possible colonoscopy and EGD 1/22. PMH includes CHF, CVA, PVD, cervical spondylosis, arthritis.    PT Comments    Pt with improved ambulation tolerance today but continues to have generalized weakness and poor endurance. Pt also with poor memory, decreased insight to deficits, and impaired problem solving. Continue to recommend 24/7 supervision due to increased falls risk and impaired cognition. Acute PT to cont to follow.    Follow Up Recommendations  Home health PT;Supervision/Assistance - 24 hour     Equipment Recommendations  None recommended by PT    Recommendations for Other Services       Precautions / Restrictions Precautions Precautions: Fall Restrictions Weight Bearing Restrictions: No    Mobility  Bed Mobility Overal bed mobility: Needs Assistance Bed Mobility: Supine to Sit     Supine to sit: Supervision     General bed mobility comments: HOB elevated, labored effort, increased time  Transfers Overall transfer level: Needs assistance Equipment used: Rolling walker (2 wheeled) Transfers: Sit to/from Stand Sit to Stand: Min guard         General transfer comment: increased time, rocked to use use momentum to get up  Ambulation/Gait Ambulation/Gait assistance: Min guard Gait Distance (Feet): 170 Feet Assistive device: Rolling walker (2 wheeled) Gait Pattern/deviations: Step-through pattern;Decreased stride length;Trunk flexed Gait velocity: impulsively fast Gait velocity interpretation: 1.31 - 2.62 ft/sec, indicative of limited community ambulator General Gait Details: verbal cues to slow down pace to conserve energy, pt with  progressive UE dependence and trunk flexion, pt stopped at 120' for standing rest break stating "I am exhausted" Pt offered chair and he declined. Pts SpO2 at 93% on RA upon completion of amb, returned to SpO2 of 98% s/p 1 min of rest. pt stated 4/4 of DOE.    Stairs             Wheelchair Mobility    Modified Rankin (Stroke Patients Only)       Balance Overall balance assessment: Needs assistance Sitting-balance support: Feet supported;No upper extremity supported Sitting balance-Leahy Scale: Good     Standing balance support: Bilateral upper extremity supported;No upper extremity supported;During functional activity Standing balance-Leahy Scale: Fair Standing balance comment: relaint on BUE support dynamically, preference to UE suppor standing during grooming                            Cognition Arousal/Alertness: Awake/alert Behavior During Therapy: WFL for tasks assessed/performed Overall Cognitive Status: Impaired/Different from baseline Area of Impairment: Memory                     Memory: Decreased short-term memory   Safety/Judgement: Decreased awareness of safety     General Comments: pt with impaired memory, difficulty guaging level of activity he can tolerate      Exercises      General Comments General comments (skin integrity, edema, etc.): RA, VSS      Pertinent Vitals/Pain Pain Assessment: No/denies pain    Home Living                      Prior Function  PT Goals (current goals can now be found in the care plan section) Acute Rehab PT Goals Patient Stated Goal: Return home, increased strength Progress towards PT goals: Progressing toward goals    Frequency    Min 3X/week      PT Plan Current plan remains appropriate    Co-evaluation              AM-PAC PT "6 Clicks" Mobility   Outcome Measure  Help needed turning from your back to your side while in a flat bed without using  bedrails?: None Help needed moving from lying on your back to sitting on the side of a flat bed without using bedrails?: None Help needed moving to and from a bed to a chair (including a wheelchair)?: A Little Help needed standing up from a chair using your arms (e.g., wheelchair or bedside chair)?: A Little Help needed to walk in hospital room?: A Little Help needed climbing 3-5 steps with a railing? : A Little 6 Click Score: 20    End of Session Equipment Utilized During Treatment: Gait belt Activity Tolerance: Patient tolerated treatment well;Patient limited by fatigue Patient left: in chair;with call bell/phone within reach;with chair alarm set Nurse Communication: Mobility status PT Visit Diagnosis: Other abnormalities of gait and mobility (R26.89)     Time: 1050-1107 PT Time Calculation (min) (ACUTE ONLY): 17 min  Charges:  $Gait Training: 8-22 mins                     Kittie Plater, PT, DPT Acute Rehabilitation Services Pager #: [phone] Office #: [phone]    Berline Lopes 07/22/2019, 11:40 AM

## 2019-07-22 NOTE — Consult Note (Signed)
Cardiology Consultation:   Patient ID: Bobby Vega; [id number]; 03/25/1935   Admit date: 07/15/2019 Date of Consult: 07/22/2019  Primary Care Provider: Lin Landsman, MD Primary Cardiologist: No primary care provider on file. New Primary Electrophysiologist:  None   Patient Profile:   Bobby Vega is a 85 y.o. male with a hx of borderline DM, HTN, HLD, CVA, BPH, GERD, PAD s/p AoBiFem, CKD V, who is being seen today for the evaluation of bradycardia at the request of Dr Cyndia Skeeters.  History of Present Illness:   Bobby Vega was admitted 01/19 with rectal bleeding x 2 weeks. Hgb 5.2>>transfused & GI consult. Cr 6.0>>Nephrology consult. Urology saw to insert Coude catheter.   Colonoscopy on 01/24, diverticulosis seen, polyp biopsied, redundant colon, ++hemorrhoids, R colon not evaluated. GI saw 01/25, ?stuttering diverticular bleed>>tagged RBC scan if additional bleeding.   01/25, pt noted to be bradycardic in the 30s-40s overnight, no associated sx. ?Mobitz II heart block. Cards asked to see.   [name] is oriented to name and place.  He answers questions as well as he is able.  He denies any history of loss of consciousness.  He has not fallen.    He has had problems with dizziness at times. It is unclear if this is recent enough to be secondary to his anemia, or if it is more longstanding.    He also describes dyspnea on exertion, and states that just prior to admission, he was not able to walk 20 feet to the mailbox.  He says this has been going on a long time, but is not able to give me any kind of timeline on worsening symptoms.  He denies any history of lower extremity edema, orthopnea, or PND.  He does very little around the house.  He has a daughter that helps with grocery shopping and errands.  He has someone coming into clean.  He admits that he does very little, and walks very little.  At least part of this is because of dyspnea on exertion.   Past Medical History:   Diagnosis Date  . Arthritis    hip  . Borderline diabetes   . BPH (benign prostatic hyperplasia)    takes Avodart daily  . Bronchitis    yrs ago  . Cervical spondylosis with myelopathy   . Cervical spondylosis without myelopathy   . Chronic back pain   . Constipation    Metamucil prn  . Diverticulosis   . ED (erectile dysfunction)    takes Cialis as needed as well as Viagra  . Embolism - blood clot    in stomach  . GERD (gastroesophageal reflux disease)    takes Nexium daily  . Hyperlipidemia    takes Zocor nightly  . Hypertension    takes Losartan daily  . Lumbar spondylosis with myelopathy   . Nocturia   . Pain in joint, hand   . Peripheral vascular disease ([place])   . Stroke ([place])   . Urinary urgency     Past Surgical History:  Procedure Laterality Date  . ABDOMINAL AORTAGRAM N/A 07/04/2011   Procedure: ABDOMINAL Maxcine Ham;  Surgeon: Serafina Mitchell, MD;  Location: [facility] CATH LAB;  Service: Cardiovascular;  Laterality: N/A;  . AORTA - BILATERAL FEMORAL ARTERY BYPASS GRAFT  07/13/2011   Procedure: AORTA BIFEMORAL BYPASS GRAFT;  Surgeon: Theotis Burrow, MD;  Location: [place];  Service: Vascular;  Laterality: Bilateral;  . BIOPSY  07/20/2019   Procedure: BIOPSY;  Surgeon: [name], MD;  Location: [facility]  ENDOSCOPY;  Service: Endoscopy;;  . COLONOSCOPY WITH PROPOFOL N/A 07/20/2019   Procedure: COLONOSCOPY WITH PROPOFOL;  Surgeon: [name], MD;  Location: [facility] ENDOSCOPY;  Service: Endoscopy;  Laterality: N/A;  . CYSTOSCOPY  07/13/2011   Procedure: CYSTOSCOPY;  Surgeon: Hanley Ben, MD;  Location: [place];  Service: Urology;  Laterality: N/A;  cystoscopy,dilatation & insertion of councill foley catheter  . LOWER EXTREMITY ANGIOGRAM Bilateral 07/04/2011   Procedure: LOWER EXTREMITY ANGIOGRAM;  Surgeon: Serafina Mitchell, MD;  Location: [facility] CATH LAB;  Service: Cardiovascular;  Laterality: Bilateral;  . SKIN GRAFT  1983     Prior to Admission medications   Medication Sig Start Date  End Date Taking? Authorizing Provider  albuterol (VENTOLIN HFA) 108 (90 Base) MCG/ACT inhaler Inhale 1-2 puffs into the lungs every 4 (four) hours as needed for shortness of breath or wheezing. 05/29/19  Yes [provider]  amLODipine (NORVASC) 10 MG tablet Take 10 mg by mouth daily. 04/25/19  Yes [provider]  aspirin EC 81 MG tablet Take 81 mg by mouth daily.   Yes [provider]  atorvastatin (LIPITOR) 20 MG tablet Take 20 mg by mouth daily.   Yes [provider]  clopidogrel (PLAVIX) 75 MG tablet Take 1 tablet (75 mg total) by mouth daily with breakfast. 05/18/13  Yes Rai, Ripudeep K, MD  famotidine (PEPCID) 20 MG tablet Take 20 mg by mouth daily.   Yes [provider]  hydrALAZINE (APRESOLINE) 25 MG tablet Take 1 tablet (25 mg total) by mouth 2 (two) times daily. 05/18/13  Yes Rai, Ripudeep K, MD  pregabalin (LYRICA) 75 MG capsule Take 75 mg by mouth daily. 03/21/19  Yes [provider]  tiZANidine (ZANAFLEX) 4 MG tablet Take 4 mg by mouth 2 (two) times daily. 07/07/19  Yes [provider]  Vitamin D, Ergocalciferol, (DRISDOL) 50000 UNITS CAPS capsule Take 50,000 Units by mouth every Sunday.    Yes [provider]  carvedilol (COREG) 6.25 MG tablet Take 1 tablet (6.25 mg total) by mouth 2 (two) times daily with a meal. Patient not taking: Reported on 07/15/2019 05/23/16   Dessa Phi, DO    Inpatient Medications: Scheduled Meds: . amLODipine  10 mg Oral Daily  . aspirin EC  81 mg Oral Daily  . atorvastatin  20 mg Oral Daily  . Chlorhexidine Gluconate Cloth  6 each Topical Daily  . clopidogrel  75 mg Oral Daily  . [START ON 07/23/2019] darbepoetin (ARANESP) injection - NON-DIALYSIS  60 mcg Subcutaneous Q Wed-1800  . lidocaine  1 application Urethral Once  . pantoprazole  40 mg Oral Q0600  . pregabalin  75 mg Oral Daily  . sodium bicarbonate  1,300 mg Oral BID  . Vitamin D (Ergocalciferol)  50,000 Units Oral Q  Sun   Continuous Infusions:  PRN Meds: acetaminophen **OR** acetaminophen, albuterol, hydrALAZINE, ondansetron **OR** ondansetron (ZOFRAN) IV  Allergies:   No Known Allergies  Social History:   Social History   Socioeconomic History  . Marital status: Widowed    Spouse name: Not on file  . Number of children: Not on file  . Years of education: Not on file  . Highest education level: Not on file  Occupational History  . Not on file  Tobacco Use  . Smoking status: Current Some Day Smoker    Years: 0.50    Types: Cigars    Last attempt to quit: 04/29/1992    Years since quitting: 27.2  . Smokeless tobacco: Never  Used  Substance and Sexual Activity  . Alcohol use: No  . Drug use: No    Types: Marijuana    Comment: Hx  . Sexual activity: Not on file  Other Topics Concern  . Not on file  Social History Narrative  . Not on file   Social Determinants of Health   Financial Resource Strain:   . Difficulty of Paying Living Expenses: Not on file  Food Insecurity:   . Worried About Charity fundraiser in the Last Year: Not on file  . Ran Out of Food in the Last Year: Not on file  Transportation Needs:   . Lack of Transportation (Medical): Not on file  . Lack of Transportation (Non-Medical): Not on file  Physical Activity:   . Days of Exercise per Week: Not on file  . Minutes of Exercise per Session: Not on file  Stress:   . Feeling of Stress : Not on file  Social Connections:   . Frequency of Communication with Friends and Family: Not on file  . Frequency of Social Gatherings with Friends and Family: Not on file  . Attends Religious Services: Not on file  . Active Member of Clubs or Organizations: Not on file  . Attends Archivist Meetings: Not on file  . Marital Status: Not on file  Intimate Partner Violence:   . Fear of Current or Ex-Partner: Not on file  . Emotionally Abused: Not on file  . Physically Abused: Not on file  . Sexually Abused: Not on file     Family History:   Family History  Problem Relation Age of Onset  . Heart disease Mother        Before age 66  . Heart disease Brother        NOT  before age 66  . Heart attack Brother   . Anesthesia problems Neg Hx   . Hypotension Neg Hx   . Malignant hyperthermia Neg Hx   . Pseudochol deficiency Neg Hx    Family Status:  Family Status  Relation Name Status  . Mother  Deceased at age 80  . Brother  Deceased  . Father  Deceased at age 53  . Sister  Alive  . Sister  Deceased  . Neg Hx  (Not Specified)    ROS:  Please see the history of present illness.  All other ROS reviewed and negative.     Physical Exam/Data:   Vitals:   07/21/19 0121 07/21/19 0842 07/21/19 2334 07/22/19 0839  BP:  (!) 148/57 (!) 152/79 (!) 170/78  Pulse:  (!) 41 (!) 40 63  Resp: 18 20 20 16   Temp:  (!) 97.4 F (36.3 C) 98.4 F (36.9 C) 98.3 F (36.8 C)  TempSrc:      SpO2:  97% 96% 97%  Weight:      Height:        Intake/Output Summary (Last 24 hours) at 07/22/2019 1258 Last data filed at 07/22/2019 0537 Gross per 24 hour  Intake -  Output 700 ml  Net -700 ml   Filed Weights   07/16/19 0440 07/20/19 1250  Weight: 71 kg 71 kg   Body mass index is 25.26 kg/m.  General:  Well nourished, well developed, male in no acute distress HEENT: normal Lymph: no adenopathy Neck: JVD -8-9 cm Endocrine:  No thryomegaly Vascular: No carotid bruits; upper extremity pulses 2+, lower extremity pulses palpable but slightly decreased, good femoral pulses with left femoral bruit Cardiac:  normal S1, S2; slightly irregular rate and rhythm; no murmur Lungs: Decreased breath sounds bases bilaterally, no wheezing, rhonchi or rales  Abd: Distended, slightly firm, tender, no hepatomegaly, + bowel sounds Ext: no edema Musculoskeletal:  No deformities, BUE and BLE strength weak but equal Skin: warm and dry  Neuro:  CNs 2-12 intact, no focal abnormalities noted Psych:  Normal affect   EKG:  The EKG was  personally reviewed and demonstrates: 1/26 ECG is sinus rhythm, heart rate 86, frequent PVCs with bigeminy at times, Q waves V1-4 Telemetry:  Telemetry was personally reviewed and demonstrates:  SR, Sinus brady, frequent PVCs, bigeminy at times, ?sinus arrhythmia with rhythmic rate changes. HR drops into the high 30s for a few beats, but not sustained < 50.    CV studies:   ECHO: 07/16/2019  1. Left ventricular ejection fraction, by visual estimation, is 40 to 45%. The left ventricle has mild to moderately decreased function. There is no left ventricular hypertrophy.  2. Basal inferolateral segment is abnormal.  3. Left ventricular diastolic parameters are consistent with Grade II diastolic dysfunction (pseudonormalization).  4. The left ventricle demonstrates regional wall motion abnormalities.  5. Global right ventricle has normal systolic function.The right ventricular size is normal. No increase in right ventricular wall thickness.  6. Left atrial size was moderately dilated.  7. Right atrial size was mildly dilated.  8. Moderate pleural effusion in the left lateral region.  9. The mitral valve is normal in structure. Mild mitral valve regurgitation. No evidence of mitral stenosis. 10. The tricuspid valve is normal in structure. 11. The tricuspid valve is normal in structure. Tricuspid valve regurgitation is mild. 12. The aortic valve is normal in structure. Aortic valve regurgitation is not visualized. No evidence of aortic valve sclerosis or stenosis. 13. The pulmonic valve was normal in structure. Pulmonic valve regurgitation is not visualized. 14. Moderately elevated pulmonary artery systolic pressure. 15. The tricuspid regurgitant velocity is 2.84 m/s, and with an assumed right atrial pressure of 15 mmHg, the estimated right ventricular systolic pressure is moderately elevated at 47.3 mmHg. 16. The inferior vena cava is dilated in size with <50% respiratory variability, suggesting right  atrial pressure of 15 mmHg.  Aortobifem: 07/13/2011 Indications:  The patient has been suffering from leg pain for many years it has gotten progressively worse. He wakes up several times at night with right leg issues. 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 Please see my clinic note for full details of our discussion we elected to proceed with aortobifemoral bypass graft. Risks and benefits were discussed and informed consent was signed.  Procedure:  The patient was identified in the holding area and taken to [facility]  The patient was then placed supine on the table. general anesthesia was administered.  We had difficulty placing a Foley catheter and had to call a urology consult. Cystoscopy was performed. Please see their note for full details. This was a very difficult Foley insertion. Once the Foley was placed, the patient was prepped and draped in the usual sterile fashion.  A time out was called and antibiotics were administered.  Longitudinal incisions were made in each groin. The patient had multiple venous tributaries in each groin which were divided individually between 3-0 silk ties. Ultimately the common femoral arteries were exposed bilaterally. Several crossing veins under the inguinal ligament were identified and ligated  between silk ties. I dissected out the common femoral artery down to the bifurcation and then approximately 2-1/2 cm on each profunda femoral arteries. Once full exposure of the groin was obtained the groins were packed with wet gauze.  Attention was then turned towards the abdomen. A midline incision was made from the xiphoid to below the umbilicus. Cautery was used to divide the subcutaneous tissue. The fascia was then opened in the midline using cautery. The abdomen was then entered and explored. No gross pathology was  identified. The gallbladder and appendix appeared normal. There were no masses. Next wet towels were placed to protect the skin edges and a Balfour was placed. A nonretractable is also set up. The colon was reflected cephalad and the small bowel mobilized to the patient's right. The ligament of Treitz was taken down with a combination of cautery and sharp dissection. I did not divide the inferior mesenteric vein. The aorta was exposed. The inferior mesenteric artery was encircled with a vessel loop. I dissected out the aorta from the renal arteries bilaterally down to just below the bifurcation. A tunnel was created between the abdominal incision and each groin incision going directly anterior to the iliac vessels. I stayed posterior to the ureters. An umbilical tape was passed. At this point in time the patient was fully heparinized. After the heparin circulated the aorta was occluded with a Harken clamp. A Zanger clamp was placed on the distal aorta. The aorta was then transected. A portion was removed. The distal end was ligated proximal to the inferior mesenteric artery using a running 3-0 Prolene and a felt strip. Next I set up for the proximal anastomosis. I selected a 16 x 8 bifurcated dacryon graft. 3 posterior vertical mattress sutures were placed with a felt strip. The anastomosis was then completed incorporating a felt strip. The clamp was then released. The limbs were brought through the previously created tunnel. Vascular clamps were used to occlude the proximal common femoral artery as well as the profunda femoral arteries. The superficial femoral arteries were chronically occluded. The right groin was addressed first. A #11 blade was used to make an arteriotomy in the distal common femoral artery which was extended longitudinally with Potts scissors going approximately 1 cm out onto the profunda femoral artery. The graft was cut to the appropriate length and spatulated to fit the size of the  arteriotomy. A running anastomosis was created with 5-0 Prolene. Prior to completion of the anastomosis the appropriate flushing maneuvers were performed the anastomosis was then completed and blood flow was reestablished to the right leg. Next attention was turned towards the left leg. After occluding the artery a #11 blade was used to make an arteriotomy in the distal common femoral artery which was extended down onto the profunda femoral artery for approximately 5 mm the graft was then cut to the appropriate length and spatulated to fit the size of the arteriotomy. A running anastomosis was created with 5-0 Prolene. Prior to completion appropriate flushing maneuvers were performed and the anastomosis was then completed. Blood flow was then reestablished to the left leg. The groins were then packed and attention was turned to the abdomen.  I elected to reimplant the inferior mesenteric artery because of the location of the graft had 3 occlude the graft. A #11 blade was used to make a graftotomy which was opened further with a 5 mm punch. The IMA was ligated with 2-0 silk tie at its origin. An end-to-side anastomosis was created  with running 6-0 Prolene on a left lateral side of the aortic graft. Once this was performed Doppler was used to evaluate signal the inferior mesenteric artery had a multiphasic signal. There also brisk signals in both profunda femoral arteries. At this point I was satisfied with the repair the patient's heparin was reversed with 50 mg of protamine. Once hemostasis was satisfactory the groins were closed in multiple layers of 20 and 3-0 Vicryl and the skin was closed with 4-0 Vicryl. The abdomen was then irrigated the retroperitoneal tissue was reapproximated with 2-0 Vicryl. The small bowel was placed back into its anatomic position. I ran the bowel to make sure there were no defects. The bowel appeared healthy. I then closed the fascia with 2 running #1 PDS suture. Subcutaneous tissues  closed with 2-0 Vicryl the skin was closed with 4-0 Vicryl. Dermabond placed on the wound. Patient tolerated the procedure well there no complications.  ABIs: 03/28/2016 Right 0.67 Left 0.77 Toe-brachial indices right 0.67, left 0.99  Laboratory Data:   Chemistry Recent Labs  Lab 07/20/19 0444 07/21/19 0326 07/22/19 0314  NA 140 140 143  K 4.1 4.1 4.1  CL 109 109 110  CO2 20* 19* 22  GLUCOSE 78 75 101*  BUN 35* 31* 28*  CREATININE 5.01* 4.49* 4.47*  CALCIUM 8.5* 8.7* 8.8*  GFRNONAA 10* 11* 11*  GFRAA 12* 13* 13*  ANIONGAP 11 12 11     Lab Results  Component Value Date   ALT 11 07/15/2019   AST 13 (L) 07/15/2019   ALKPHOS 104 07/15/2019   BILITOT 0.6 07/15/2019   Hematology Recent Labs  Lab 07/20/19 0444 07/21/19 0326 07/22/19 0314  WBC 9.8 9.5 9.9  RBC 2.73* 2.95* 3.03*  HGB 8.0* 8.5* 8.8*  HCT 25.4* 27.2* 28.1*  MCV 93.0 92.2 92.7  MCH 29.3 28.8 29.0  MCHC 31.5 31.3 31.3  RDW 20.0* 20.3* 20.9*  PLT 147* 167 185   Cardiac Enzymes High Sensitivity Troponin:  No results for input(s): TROPONINIHS in the last 720 hours.    BNPNo results for input(s): BNP, PROBNP in the last 168 hours.  DDimer No results for input(s): DDIMER in the last 168 hours. TSH:  Lab Results  Component Value Date   TSH 4.469 05/15/2016   Lipids: Lab Results  Component Value Date   CHOL 263 (H) 05/16/2013   HDL 28 (L) 05/16/2013   LDLCALC 197 (H) 05/16/2013   TRIG 190 (H) 05/16/2016   CHOLHDL 9.4 05/16/2013   HgbA1c: Lab Results  Component Value Date   HGBA1C 6.0 (H) 05/16/2013   Magnesium:  Magnesium  Date Value Ref Range Status  07/22/2019 2.3 1.7 - 2.4 mg/dL Final    Comment:    Performed at [facility], [place] [address], [place], [place] [address]     Radiology/Studies:  No results found.  Assessment and Plan:   1. Bradycardia - No discernable sx, no hx presyncope or syncope - HR does drop to high 30s for brief periods, but is not sustained - at  times, PVCs w/ compensatory pauses cause decreased HR - no pauses > 2 seconds - no rate-lowering rx - no indication for PPM  Otherwise, per IM Active Problems:   Acute GI bleeding   Lower GI bleed   Diverticulosis of colon with hemorrhage     For questions or updates, please contact [facility] Please consult [email or web address] for contact info under Cardiology/STEMI.   Jonetta Speak, PA-C  07/22/2019 12:58 PM

## 2019-07-22 NOTE — Progress Notes (Signed)
PROGRESS NOTE  Bobby Vega FGH:[id number] DOB: 19-Sep-1934   PCP: Lin Landsman, MD  Patient is from: home  DOA: 07/15/2019 LOS: 7  Brief Narrative / Interim history: 85 year old male with history of HTN, CVA, PVD with aortofemoral bypass on DAPT and BPH presenting with rectal bleed/hematochezia with progressive weakness for about 2 weeks.  Had presyncope on the day of presentation.  In ED, COVID-19 and influenza PCR negative.  RR 24.  HDS.  98% on RA.  Hgb 5.2.  MCV 106.  K5.3. Cr 6.06.  BUN 46.  Bicarb 17.  Anemia panel normal.  CXR not impressive.  Two units of pRBC ordered.  GI consulted for GI bleed.  Nephrology consulted for AKI/CKD.  Patient had urethral dilation and Foley catheter placement by urology on 07/14/2018.  Patient had colonoscopy on 1/24 that revealed diverticulosis in the entire examined colon although not able to visualize the right colon and cecum.  Colonoscopy also revealed prominent internal hemorrhoids and a large flat polyp in the distal transverse colon which has been biopsied.  Pathology pending.   Subjective: Concern about type II second-degree AV block with bradycardia overnight.  Telemetry reveals sinus bradycardia with PVCs.  EKG this morning with multiple PVCs.  Hemodynamically stable.  Patient is not symptomatic from this.  He denies chest pain, dyspnea, palpitation or GI symptoms.  However, patient is not a great historian.  Objective: Vitals:   07/21/19 0121 07/21/19 0842 07/21/19 2334 07/22/19 0839  BP:  (!) 148/57 (!) 152/79 (!) 170/78  Pulse:  (!) 41 (!) 40 63  Resp: 18 20 20 16   Temp:  (!) 97.4 F (36.3 C) 98.4 F (36.9 C) 98.3 F (36.8 C)  TempSrc:      SpO2:  97% 96% 97%  Weight:      Height:        Intake/Output Summary (Last 24 hours) at 07/22/2019 1131 Last data filed at 07/22/2019 0537 Gross per 24 hour  Intake -  Output 700 ml  Net -700 ml   Filed Weights   07/16/19 0440 07/20/19 1250  Weight: 71 kg 71 kg    Examination:   GENERAL: No acute distress.  Appears well.  HEENT: MMM.  Vision and hearing grossly intact.  NECK: Supple.  No apparent JVD.  RESP:  No IWOB. Good air movement bilaterally. CVS: Normal rate.  Regular rhythm.. Heart sounds normal.  ABD/GI/GU: Bowel sounds present. Soft. Non tender.  MSK/EXT:  Moves extremities. No apparent deformity. No edema.  SKIN: no apparent skin lesion or wound NEURO: Awake, alert and oriented self, place and person but not time.  No apparent focal neuro deficit. PSYCH: Calm. Normal affect.  Procedures:  1/19-indwelling Foley catheter by urology.  Assessment & Plan: Acute blood loss anemia due to GI bleed: presents with painless hematochezia concerning for diverticular bleed.  Anemia panel normal.  Hemodynamically stable now. -Colonoscopy on 1/24 with diverticulosis in the visualized colon, colonic polyps and internal hemorrhoid. -Hgb 11.8 (04/2016)> 5.7 (admit)>2u> 8.4> 8.1> 7.5> 8.5> 8.8. -Received IV Feraheme and ESA on 1/20. -Start p.o. ferrous sulfate. -If H&H stable after resuming Plavix, will discharge home on 1/27. -GI signed off.  Sinus bradycardia with questionable type II second-degree AV block on telemetry: Twelve-lead EKG this morning with frequent PVCs.  -Holding Coreg -Consult cardiology.  Acute systolic CHF: Echo with EF of 40 to 45%, G2DD, RWMA, mod LAE and RVSP to 47.  Not on diuretics at home.  Appears euvolemic on exam.  I&O incomplete but  700 cc overnight. -Continue monitoring fluid status -Consult cardiology.  Presyncope: Likely due to #1.  Echo as below. -Monitor anemia as above  Acute on CKD-4: Cr 2.51 (04/2016)> 6.06 (admit)> 7.29>> 5.01> 4.47.  Renal ultrasound consistent with CKD but no hydro or renal obstruction.  I and O incomplete but 900 cc overnight.  -Nephrology signed off-will arrange outpatient follow-up. -Continue monitoring -Avoid nephrotoxic meds.  Hypernatremia: Resolved.  Non-anion gap metabolic acidosis: Likely  due to renal failure.  Stable.  History of left parietal and occipital cortex CVA in 2014: Stable. History of PVD with or for femoral bypass in 2013.  He has been on DAPT -Plavix on hold due to GI bleed. -Continue home aspirin and a statin.  BPH/urethral stricture: Foley catheter placed by urology on 1/19 with a plan to continue for 1 week -Voiding trial today. -Received CTX prophylactically for 3 days.  Essential hypertension: SBP elevated. -Resume amlodipine.  Hyperkalemia: Likely due to renal failure.  Resolved.  Thrombocytopenia: too early for HIT.  Improving. -Continue monitoring.  Debility/physical deconditioning -PT/OT eval  Mild leukocytosis/bandemia: Likely demargination.  Resolved. -Continue monitoring.  Chronic pain: -Continue low-dose Lyrica. -Discontinued Zanaflex.               DVT prophylaxis: SCD in setting of GI bleed Code Status: Full code Family Communication: Patient and RN.  Attempted to call patient's daughter but no answer. Disposition Plan: Remains inpatient due to GI bleed and arrhythmia Consultants: GI, nephrology (off), urology   Microbiology summarized: 1/19-influenza PCR negative. 1/19-COVID-19 negative.  Sch Meds:  Scheduled Meds: . amLODipine  10 mg Oral Daily  . aspirin EC  81 mg Oral Daily  . atorvastatin  20 mg Oral Daily  . Chlorhexidine Gluconate Cloth  6 each Topical Daily  . clopidogrel  75 mg Oral Daily  . lidocaine  1 application Urethral Once  . pantoprazole  40 mg Oral Q0600  . pregabalin  75 mg Oral Daily  . sodium bicarbonate  1,300 mg Oral BID  . Vitamin D (Ergocalciferol)  50,000 Units Oral Q Sun   Continuous Infusions:  PRN Meds:.acetaminophen **OR** acetaminophen, albuterol, hydrALAZINE, ondansetron **OR** ondansetron (ZOFRAN) IV  Antimicrobials: Anti-infectives (From admission, onward)   Start     Dose/Rate Route Frequency Ordered Stop   07/16/19 0100  cefTRIAXone (ROCEPHIN) 1 g in sodium chloride 0.9  % 100 mL IVPB     1 g 200 mL/hr over 30 Minutes Intravenous Every 24 hours 07/16/19 0048 07/18/19 0245       I have personally reviewed the following labs and images: CBC: Recent Labs  Lab 07/18/19 0208 07/19/19 0540 07/20/19 0444 07/21/19 0326 07/22/19 0314  WBC 11.8* 11.4* 9.8 9.5 9.9  HGB 7.6* 7.5* 8.0* 8.5* 8.8*  HCT 25.0* 24.4* 25.4* 27.2* 28.1*  MCV 91.9 92.4 93.0 92.2 92.7  PLT 128* 138* 147* 167 185   BMP &GFR Recent Labs  Lab 07/18/19 0208 07/19/19 0540 07/20/19 0444 07/21/19 0326 07/22/19 0314  NA 138 139 140 140 143  K 4.5 4.4 4.1 4.1 4.1  CL 107 107 109 109 110  CO2 22 21* 20* 19* 22  GLUCOSE 103* 86 78 75 101*  BUN 43* 37* 35* 31* 28*  CREATININE 5.51* 5.34* 5.01* 4.49* 4.47*  CALCIUM 8.1* 8.3* 8.5* 8.7* 8.8*  MG 2.4 2.3 2.2 2.1 2.3  PHOS 4.9* 5.5* 5.1* 4.8* 4.1   Estimated Creatinine Clearance: 11.5 mL/min (A) (by C-G formula based on SCr of 4.47 mg/dL (H)). Liver &  Pancreas: Recent Labs  Lab 07/18/19 0208 07/19/19 0540 07/20/19 0444 07/21/19 0326 07/22/19 0314  ALBUMIN 2.9* 3.0* 3.0* 3.1* 3.2*   No results for input(s): LIPASE, AMYLASE in the last 168 hours. No results for input(s): AMMONIA in the last 168 hours. Diabetic: No results for input(s): HGBA1C in the last 72 hours. No results for input(s): GLUCAP in the last 168 hours. Cardiac Enzymes: No results for input(s): CKTOTAL, CKMB, CKMBINDEX, TROPONINI in the last 168 hours. No results for input(s): PROBNP in the last 8760 hours. Coagulation Profile: No results for input(s): INR, PROTIME in the last 168 hours. Thyroid Function Tests: No results for input(s): TSH, T4TOTAL, FREET4, T3FREE, THYROIDAB in the last 72 hours. Lipid Profile: No results for input(s): CHOL, HDL, LDLCALC, TRIG, CHOLHDL, LDLDIRECT in the last 72 hours. Anemia Panel: No results for input(s): VITAMINB12, FOLATE, FERRITIN, TIBC, IRON, RETICCTPCT in the last 72 hours. Urine analysis:    Component Value  Date/Time   COLORURINE YELLOW 05/17/2013 0530   APPEARANCEUR CLOUDY (A) 05/17/2013 0530   LABSPEC 1.012 05/17/2013 0530   PHURINE 5.0 05/17/2013 0530   GLUCOSEU NEGATIVE 05/17/2013 0530   HGBUR NEGATIVE 05/17/2013 0530   BILIRUBINUR NEGATIVE 05/17/2013 0530   KETONESUR NEGATIVE 05/17/2013 0530   PROTEINUR 30 (A) 05/17/2013 0530   UROBILINOGEN 0.2 05/17/2013 0530   NITRITE NEGATIVE 05/17/2013 0530   LEUKOCYTESUR MODERATE (A) 05/17/2013 0530   Sepsis Labs: Invalid input(s): PROCALCITONIN, [place]  Microbiology: Recent Results (from the past 240 hour(s))  Respiratory Panel by RT PCR (Flu A&B, Covid) - Nasopharyngeal Swab     Status: None   Collection Time: 07/15/19 10:38 AM   Specimen: Nasopharyngeal Swab  Result Value Ref Range Status   SARS Coronavirus 2 by RT PCR NEGATIVE NEGATIVE Final    Comment: (NOTE) SARS-CoV-2 target nucleic acids are NOT DETECTED. The SARS-CoV-2 RNA is generally detectable in upper respiratoy specimens during the acute phase of infection. The lowest concentration of SARS-CoV-2 viral copies this assay can detect is 131 copies/mL. A negative result does not preclude SARS-Cov-2 infection and should not be used as the sole basis for treatment or other patient management decisions. A negative result may occur with  improper specimen collection/handling, submission of specimen other than nasopharyngeal swab, presence of viral mutation(s) within the areas targeted by this assay, and inadequate number of viral copies (<131 copies/mL). A negative result must be combined with clinical observations, patient history, and epidemiological information. The expected result is Negative. Fact Sheet for Patients:  PinkCheek.be Fact Sheet for Healthcare Providers:  GravelBags.it This test is not yet ap proved or cleared by the [place] FDA and  has been authorized for detection and/or diagnosis of  SARS-CoV-2 by FDA under an Emergency Use Authorization (EUA). This EUA will remain  in effect (meaning this test can be used) for the duration of the COVID-19 declaration under Section 564(b)(1) of the Act, 21 U.S.C. section 360bbb-3(b)(1), unless the authorization is terminated or revoked sooner.    Influenza A by PCR NEGATIVE NEGATIVE Final   Influenza B by PCR NEGATIVE NEGATIVE Final    Comment: (NOTE) The Xpert Xpress SARS-CoV-2/FLU/RSV assay is intended as an aid in  the diagnosis of influenza from Nasopharyngeal swab specimens and  should not be used as a sole basis for treatment. Nasal washings and  aspirates are unacceptable for Xpert Xpress SARS-CoV-2/FLU/RSV  testing. Fact Sheet for Patients: PinkCheek.be Fact Sheet for Healthcare Providers: GravelBags.it This test is not yet approved or cleared by the [place]  States FDA and  has been authorized for detection and/or diagnosis of SARS-CoV-2 by  FDA under an Emergency Use Authorization (EUA). This EUA will remain  in effect (meaning this test can be used) for the duration of the  Covid-19 declaration under Section 564(b)(1) of the Act, 21  U.S.C. section 360bbb-3(b)(1), unless the authorization is  terminated or revoked. Performed at [facility], [place] [address] [place], [place] [address]     Radiology Studies: No results found.    Taye T. Richfield  If 7PM-7AM, please contact night-coverage [email or web address] Password [facility] 07/22/2019, 11:31 AM

## 2019-07-22 NOTE — Progress Notes (Signed)
Per Central Telemetry, pt HR dropped to 39 as well as started 2nd Type 2 Heart Block. Night Coverage was paged.  No new orders were given. RN Checked on patient and patient is asymptomatic and sleeping.

## 2019-07-22 NOTE — Progress Notes (Addendum)
Daily Rounding Note  07/22/2019, 11:01 AM  LOS: 7 days   SUBJECTIVE:   Chief complaint: Painless hematochezia.  No blood yesterday, none today.     Feels a little bit bloated.  Got short of breath walking in the hall with PT. Appetite stable and generally reduced.  OBJECTIVE:         Vital signs in last 24 hours:    Temp:  [98.3 F (36.8 C)-98.4 F (36.9 C)] 98.3 F (36.8 C) (01/26 0839) Pulse Rate:  [40-63] 63 (01/26 0839) Resp:  [16-20] 16 (01/26 0839) BP: (152-170)/(78-79) 170/78 (01/26 0839) SpO2:  [96 %-97 %] 97 % (01/26 0839) Last BM Date: 07/21/19 Filed Weights   07/16/19 0440 07/20/19 1250  Weight: 71 kg 71 kg   General: Frail, alert, comfortable Heart: RRR. Chest: Diminished breath sounds but no labored breathing or cough. Abdomen: Soft.  Nondistended, nontender.  Active bowel sounds Extremities: No CCE. Neuro/Psych: Alert.  Oriented x3.  Is all 4 limbs.  Appropriate.  Intake/Output from previous day: 01/25 0701 - 01/26 0700 In: -  Out: 700 [Urine:700]  Intake/Output this shift: No intake/output data recorded.  Lab Results: Recent Labs    07/20/19 0444 07/21/19 0326 07/22/19 0314  WBC 9.8 9.5 9.9  HGB 8.0* 8.5* 8.8*  HCT 25.4* 27.2* 28.1*  PLT 147* 167 185   BMET Recent Labs    07/20/19 0444 07/21/19 0326 07/22/19 0314  NA 140 140 143  K 4.1 4.1 4.1  CL 109 109 110  CO2 20* 19* 22  GLUCOSE 78 75 101*  BUN 35* 31* 28*  CREATININE 5.01* 4.49* 4.47*  CALCIUM 8.5* 8.7* 8.8*   LFT Recent Labs    07/20/19 0444 07/21/19 0326 07/22/19 0314  ALBUMIN 3.0* 3.1* 3.2*   PT/INR No results for input(s): LABPROT, INR in the last 72 hours. Hepatitis Panel No results for input(s): HEPBSAG, HCVAB, HEPAIGM, HEPBIGM in the last 72 hours.  Studies/Results: No results found. Scheduled Meds: . aspirin EC  81 mg Oral Daily  . atorvastatin  20 mg Oral Daily  . Chlorhexidine Gluconate Cloth   6 each Topical Daily  . clopidogrel  75 mg Oral Daily  . lidocaine  1 application Urethral Once  . pantoprazole  40 mg Oral Q0600  . pregabalin  75 mg Oral Daily  . sodium bicarbonate  1,300 mg Oral BID  . Vitamin D (Ergocalciferol)  50,000 Units Oral Q Sun   Continuous Infusions: PRN Meds:.acetaminophen **OR** acetaminophen, albuterol, hydrALAZINE, ondansetron **OR** ondansetron (ZOFRAN) IV  ASSESMENT:   *   Painless hematochezia x 2 weeks PTA.  Severe anemia.  Not iron, b12 or folate deficient.   Hgb 5.7 >> 2 PRBCs >> 8.5 >> 8.8.  Feraheme on 07/16/19.   1/24 colonoscopy: 12 mm distal transverse polyp, biopsied but not removed.  Pan diverticulosis.  Internal rrhoids (suspect these are source of bleeding).  Unable to reach right colon, ileum due to redundant colon   *   Stage 5 CKD, contributing to anemia.  BUN/creat improved but still stage 5.   Started ESA per renal.  Renal plans close office fup w Dr Arty Baumgartner.    *   Chronic Plavix, for  Hx CVA, last dose 1/19.    *   Urinary bladder outlet obstruction.      PLAN   *   GI will sign off.  No need for him to have GI office follow-up unless GI  issues arise. If/when hematochezia recurs would proceed to nuclear medicine tagged RBC bleeding scan.  Pt not candidate for CTAP angio due to advanced CKD.  *   After discharge he should have serial CBCs to assure that his anemia is stable and improving.    Azucena Freed  07/22/2019, 11:01 AM Phone [phone]

## 2019-07-23 ENCOUNTER — Telehealth: Payer: Self-pay | Admitting: *Deleted

## 2019-07-23 DIAGNOSIS — I5021 Acute systolic (congestive) heart failure: Secondary | ICD-10-CM

## 2019-07-23 DIAGNOSIS — I739 Peripheral vascular disease, unspecified: Secondary | ICD-10-CM

## 2019-07-23 DIAGNOSIS — Z8673 Personal history of transient ischemic attack (TIA), and cerebral infarction without residual deficits: Secondary | ICD-10-CM

## 2019-07-23 DIAGNOSIS — D649 Anemia, unspecified: Secondary | ICD-10-CM

## 2019-07-23 LAB — RENAL FUNCTION PANEL
Albumin: 3 g/dL — ABNORMAL LOW (ref 3.5–5.0)
Anion gap: 10 (ref 5–15)
BUN: 27 mg/dL — ABNORMAL HIGH (ref 8–23)
CO2: 23 mmol/L (ref 22–32)
Calcium: 8.5 mg/dL — ABNORMAL LOW (ref 8.9–10.3)
Chloride: 111 mmol/L (ref 98–111)
Creatinine, Ser: 4.28 mg/dL — ABNORMAL HIGH (ref 0.61–1.24)
GFR calc Af Amer: 14 mL/min — ABNORMAL LOW (ref >60)
GFR calc non Af Amer: 12 mL/min — ABNORMAL LOW (ref >60)
Glucose, Bld: 104 mg/dL — ABNORMAL HIGH (ref 70–99)
Phosphorus: 3.6 mg/dL (ref 2.5–4.6)
Potassium: 4.2 mmol/L (ref 3.5–5.1)
Sodium: 144 mmol/L (ref 135–145)

## 2019-07-23 LAB — CBC
HCT: 26.9 % — ABNORMAL LOW (ref 39.0–52.0)
Hemoglobin: 8.5 g/dL — ABNORMAL LOW (ref 13.0–17.0)
MCH: 29.3 pg (ref 26.0–34.0)
MCHC: 31.6 g/dL (ref 30.0–36.0)
MCV: 92.8 fL (ref 80.0–100.0)
Platelets: 186 10*3/uL (ref 150–400)
RBC: 2.9 MIL/uL — ABNORMAL LOW (ref 4.22–5.81)
RDW: 21.3 % — ABNORMAL HIGH (ref 11.5–15.5)
WBC: 8.9 10*3/uL (ref 4.0–10.5)
nRBC: 0.6 % — ABNORMAL HIGH (ref 0.0–0.2)

## 2019-07-23 LAB — MAGNESIUM: Magnesium: 1.9 mg/dL (ref 1.7–2.4)

## 2019-07-23 LAB — SURGICAL PATHOLOGY

## 2019-07-23 NOTE — Progress Notes (Signed)
Patient was stable at discharge. I removed his IV. We reviewed the discharge education. Patient and his daughter [name] verbalized understanding and had no further questions. Patient left with belongings in hand.

## 2019-07-23 NOTE — Care Management Important Message (Signed)
Important Message  Patient Details  Name: Bobby Vega MRN: [id number] Date of Birth: 1935-01-05   Medicare Important Message Given:  Yes     Denina Rieger Montine Circle 07/23/2019, 12:17 PM

## 2019-07-23 NOTE — TOC Transition Note (Signed)
Transition of Care [facility]) - CM/SW Discharge Note   Patient Details  Name: Bobby Vega MRN: [id number] Date of Birth: 1934/06/27  [facility] CM/SW Contact:  Pollie Friar, RN Phone Number: 07/23/2019, 11:21 AM   Clinical Narrative:    Pt discharging home with [facility] services through [place]. Cory with [facility] aware of d/c.  Daughter to provide transport home after 3 pm. Bedside RN aware.    Final next level of care: [place] Barriers to Discharge: No Barriers Identified   Patient Goals and CMS Choice Patient states their goals for this hospitalization and ongoing recovery are:: to go home CMS Medicare.gov Compare Post Acute Care list provided to:: Patient Choice offered to / list presented to : Patient  Discharge Placement                       Discharge Plan and Services     Post Acute Care Choice: Home Health                    HH Arranged: PT, OT [facility] Agency: [place] Date [place]: 07/20/19 Time [place]: [id number] Representative spoke with at [place]: Tommi Rumps aware of d/c  Social Determinants of Health (SDOH) Interventions     Readmission Risk Interventions No flowsheet data found.

## 2019-07-23 NOTE — Plan of Care (Signed)
Care plan goals met - pt adequate for discharge

## 2019-07-23 NOTE — Progress Notes (Signed)
Occupational Therapy Treatment Patient Details Name: Bobby Vega MRN: [id number] DOB: 12-Dec-1934 Today's Date: 07/23/2019    History of present illness Pt is an 85 y.o. male admitted 07/15/19 with rectal bleed, progressive weakness and presyncope; concern for diverticular bleed. Also with AKI on CKD4. (-) COVID-19. Plan for possible colonoscopy and EGD 1/22. PMH includes CHF, CVA, PVD, cervical spondylosis, arthritis.   OT comments  Patient progressing slowly towards goals.  Limited by cognition and decreased activity tolerance.  Patient requires supervision for transfers and in room mobility today, increased tolerance to grooming at sink but requires rest break before ambulating back to EOB.  Requires cueing for problem solving and orientation when donning shoes. Discussed safety at dc, patient reports "not doing much" when he is alone, aide and daughter assist with all IADLs.  Recommend 24/7 support, and assist with med mgmt, cooking and cleaning.  Will follow.    Follow Up Recommendations  Home health OT;Supervision/Assistance - 24 hour(increased HH aide hours )    Equipment Recommendations  None recommended by OT    Recommendations for Other Services      Precautions / Restrictions Precautions Precautions: Fall Restrictions Weight Bearing Restrictions: No       Mobility Bed Mobility               General bed mobility comments: EOB upon entry   Transfers Overall transfer level: Needs assistance Equipment used: Rolling walker (2 wheeled) Transfers: Sit to/from Stand Sit to Stand: Supervision         General transfer comment: increased time, good recall of hand placement; no physical assist required    Balance Overall balance assessment: Needs assistance Sitting-balance support: Feet supported;No upper extremity supported Sitting balance-Leahy Scale: Good     Standing balance support: Bilateral upper extremity supported;During functional activity;No upper  extremity supported Standing balance-Leahy Scale: Fair Standing balance comment: standing without UE support, but reliant on BUE support dynamically                           ADL either performed or assessed with clinical judgement   ADL Overall ADL's : Needs assistance/impaired     Grooming: Supervision/safety;Standing;Wash/dry face;Oral care Grooming Details (indicate cue type and reason): for safety, increased time but improved tolerance to activity; completing 2 tasks before requiring sitting rest break             Lower Body Dressing: Minimal assistance;Sit to/from stand Lower Body Dressing Details (indicate cue type and reason): requires cueing for orientation of shoes, educated on compensatory techniques as patient reports "it takes me 2 hours to put socks on at home"; reports aide will assist  Toilet Transfer: Supervision/safety;Ambulation;RW Toilet Transfer Details (indicate cue type and reason): simulated to/from EOB          Functional mobility during ADLs: Supervision/safety;Rolling walker General ADL Comments: pt with improving activity tolerance to daily tasks, limited by cognition, weakness and endurance; but reports "not doing much" at home, daughter assists with IADLs, and has aide who helps in am      Vision       Perception     Praxis      Cognition Arousal/Alertness: Awake/alert Behavior During Therapy: WFL for tasks assessed/performed Overall Cognitive Status: Impaired/Different from baseline Area of Impairment: Orientation;Memory;Awareness;Problem solving;Safety/judgement                 Orientation Level: Disoriented to;Time   Memory: Decreased short-term memory   Safety/Judgement: Decreased  awareness of safety Awareness: Emergent Problem Solving: Slow processing;Difficulty sequencing;Requires verbal cues General Comments: pt with impaired balance and decreased problem solving        Exercises     Shoulder Instructions        General Comments RA, VSS    Pertinent Vitals/ Pain       Pain Assessment: No/denies pain  Home Living                                          Prior Functioning/Environment              Frequency  Min 2X/week        Progress Toward Goals  OT Goals(current goals can now be found in the care plan section)  Progress towards OT goals: Progressing toward goals  Acute Rehab OT Goals Patient Stated Goal: Return home, increased strength OT Goal Formulation: With patient  Plan Discharge plan remains appropriate;Frequency remains appropriate    Co-evaluation                 AM-PAC OT "6 Clicks" Daily Activity     Outcome Measure   Help from another person eating meals?: None Help from another person taking care of personal grooming?: None Help from another person toileting, which includes using toliet, bedpan, or urinal?: A Lot Help from another person bathing (including washing, rinsing, drying)?: A Little Help from another person to put on and taking off regular upper body clothing?: A Little Help from another person to put on and taking off regular lower body clothing?: A Little 6 Click Score: 19    End of Session Equipment Utilized During Treatment: Rolling walker  OT Visit Diagnosis: Unsteadiness on feet (R26.81);Muscle weakness (generalized) (M62.81);Other symptoms and signs involving cognitive function;Pain   Activity Tolerance Patient tolerated treatment well   Patient Left with call bell/phone within reach;with bed alarm set;Other (comment)(seated EOB )   Nurse Communication Mobility status        Time: 8828-0034 OT Time Calculation (min): 23 min  Charges: OT General Charges $OT Visit: 1 Visit OT Treatments $Self Care/Home Management : 23-37 mins  [place] Pager [phone] Office [phone]     Delight Stare 07/23/2019, 10:05 AM

## 2019-07-23 NOTE — Telephone Encounter (Signed)
Patient enrolled for [employer] to ship a 30 day cardiac event monitor to [address], [place], [place] [address]. Patients daughter will call late this afternoon for instructions.

## 2019-07-23 NOTE — Discharge Instructions (Signed)
Diverticulosis  Diverticulosis is a condition that develops when small pouches (diverticula) form in the wall of the large intestine (colon). The colon is where water is absorbed and stool (feces) is formed. The pouches form when the inside layer of the colon pushes through weak spots in the outer layers of the colon. You may have a few pouches or many of them. The pouches usually do not cause problems unless they become inflamed or infected. When this happens, the condition is called diverticulitis. What are the causes? The cause of this condition is not known. What increases the risk? The following factors may make you more likely to develop this condition:  Being older than age 18. Your risk for this condition increases with age. Diverticulosis is rare among people younger than age 47. By age 73, many people have it.  Eating a low-fiber diet.  Having frequent constipation.  Being overweight.  Not getting enough exercise.  Smoking.  Taking over-the-counter pain medicines, like aspirin and ibuprofen.  Having a family history of diverticulosis. What are the signs or symptoms? In most people, there are no symptoms of this condition. If you do have symptoms, they may include:  Bloating.  Cramps in the abdomen.  Constipation or diarrhea.  Pain in the lower left side of the abdomen. How is this diagnosed? Because diverticulosis usually has no symptoms, it is most often diagnosed during an exam for other colon problems. The condition may be diagnosed by:  Using a flexible scope to examine the colon (colonoscopy).  Taking an X-ray of the colon after dye has been put into the colon (barium enema).  Having a CT scan. How is this treated? You may not need treatment for this condition. Your health care provider may recommend treatment to prevent problems. You may need treatment if you have symptoms or if you previously had diverticulitis. Treatment may include:  Eating a high-fiber  diet.  Taking a fiber supplement.  Taking a live bacteria supplement (probiotic).  Taking medicine to relax your colon. Follow these instructions at home: Medicines  Take over-the-counter and prescription medicines only as told by your health care provider.  If told by your health care provider, take a fiber supplement or probiotic. Constipation prevention Your condition may cause constipation. To prevent or treat constipation, you may need to:  Drink enough fluid to keep your urine pale yellow.  Take over-the-counter or prescription medicines.  Eat foods that are high in fiber, such as beans, whole grains, and fresh fruits and vegetables.  Limit foods that are high in fat and processed sugars, such as fried or sweet foods.  General instructions  Try not to strain when you have a bowel movement.  Keep all follow-up visits as told by your health care provider. This is important. Contact a health care provider if you:  Have pain in your abdomen.  Have bloating.  Have cramps.  Have not had a bowel movement in 3 days. Get help right away if:  Your pain gets worse.  Your bloating becomes very bad.  You have a fever or chills, and your symptoms suddenly get worse.  You vomit.  You have bowel movements that are bloody or black.  You have bleeding from your rectum. Summary  Diverticulosis is a condition that develops when small pouches (diverticula) form in the wall of the large intestine (colon).  You may have a few pouches or many of them.  This condition is most often diagnosed during an exam for other colon  problems.  Treatment may include increasing the fiber in your diet, taking supplements, or taking medicines. This information is not intended to replace advice given to you by your health care provider. Make sure you discuss any questions you have with your health care provider. Document Revised: 01/09/2019 Document Reviewed: 01/09/2019 Else[employer] Patient  Education  [place].    Hemorrhoids Hemorrhoids are swollen veins that may develop:  In the butt (rectum). These are called internal hemorrhoids.  Around the opening of the butt (anus). These are called external hemorrhoids. Hemorrhoids can cause pain, itching, or bleeding. Most of the time, they do not cause serious problems. They usually get better with diet changes, lifestyle changes, and other home treatments. What are the causes? This condition may be caused by:  Having trouble pooping (constipation).  Pushing hard (straining) to poop.  Watery poop (diarrhea).  Pregnancy.  Being very overweight (obese).  Sitting for long periods of time.  Heavy lifting or other activity that causes you to strain.  Anal sex.  Riding a bike for a long period of time. What are the signs or symptoms? Symptoms of this condition include:  Pain.  Itching or soreness in the butt.  Bleeding from the butt.  Leaking poop.  Swelling in the area.  One or more lumps around the opening of your butt. How is this diagnosed? A doctor can often diagnose this condition by looking at the affected area. The doctor may also:  Do an exam that involves feeling the area with a gloved hand (digital rectal exam).  Examine the area inside your butt using a small tube (anoscope).  Order blood tests. This may be done if you have lost a lot of blood.  Have you get a test that involves looking inside the colon using a flexible tube with a camera on the end (sigmoidoscopy or colonoscopy). How is this treated? This condition can usually be treated at home. Your doctor may tell you to change what you eat, make lifestyle changes, or try home treatments. If these do not help, procedures can be done to remove the hemorrhoids or make them smaller. These may involve:  Placing rubber bands at the base of the hemorrhoids to cut off their blood supply.  Injecting medicine into the hemorrhoids to shrink  them.  Shining a type of light energy onto the hemorrhoids to cause them to fall off.  Doing surgery to remove the hemorrhoids or cut off their blood supply. Follow these instructions at home: Eating and drinking   Eat foods that have a lot of fiber in them. These include whole grains, beans, nuts, fruits, and vegetables.  Ask your doctor about taking products that have added fiber (fibersupplements).  Reduce the amount of fat in your diet. You can do this by: ? Eating low-fat dairy products. ? Eating less red meat. ? Avoiding processed foods.  Drink enough fluid to keep your pee (urine) pale yellow. Managing pain and swelling   Take a warm-water bath (sitz bath) for 20 minutes to ease pain. Do this 3-4 times a day. You may do this in a bathtub or using a portable sitz bath that fits over the toilet.  If told, put ice on the painful area. It may be helpful to use ice between your warm baths. ? Put ice in a plastic bag. ? Place a towel between your skin and the bag. ? Leave the ice on for 20 minutes, 2-3 times a day. General instructions  Take over-the-counter  and prescription medicines only as told by your doctor. ? Medicated creams and medicines may be used as told.  Exercise often. Ask your doctor how much and what kind of exercise is best for you.  Go to the bathroom when you have the urge to poop. Do not wait.  Avoid pushing too hard when you poop.  Keep your butt dry and clean. Use wet toilet paper or moist towelettes after pooping.  Do not sit on the toilet for a long time.  Keep all follow-up visits as told by your doctor. This is important. Contact a doctor if you:  Have pain and swelling that do not get better with treatment or medicine.  Have trouble pooping.  Cannot poop.  Have pain or swelling outside the area of the hemorrhoids. Get help right away if you have:  Bleeding that will not stop. Summary  Hemorrhoids are swollen veins in the butt or  around the opening of the butt.  They can cause pain, itching, or bleeding.  Eat foods that have a lot of fiber in them. These include whole grains, beans, nuts, fruits, and vegetables.  Take a warm-water bath (sitz bath) for 20 minutes to ease pain. Do this 3-4 times a day. This information is not intended to replace advice given to you by your health care provider. Make sure you discuss any questions you have with your health care provider. Document Revised: 06/20/2018 Document Reviewed: 11/01/2017 Elsevier Patient Education  [place].

## 2019-07-23 NOTE — Discharge Summary (Signed)
Physician Discharge Summary  Bobby Vega ZYS:[id number] DOB: January 08, 1935 DOA: 07/15/2019  PCP: Lin Landsman, MD  Admit date: 07/15/2019 Discharge date: 07/23/2019  Admitted From: Home Disposition: Home  Recommendations for Outpatient Follow-up:  1. Follow ups as below. 2. Cardiology to arrange outpatient follow-up for event monitor. 3. Please obtain CBC/BMP/Mag at follow up 4. Please follow up on the following pending results: None  Home Health: PT/OT Equipment/Devices: None required.  Discharge Condition: Stable CODE STATUS: Full code  Follow-up Information    Donato Heinz, MD Follow up.   Specialty: Nephrology Why: We will call to schedule an appointment and lab work Contact information: [place] [address] [phone]        Care, [facility] Follow up.   Specialty: [place] Why: For home health services, they will call [name] to schedule our first home appointment Contact information: [place] [address] [phone]        Lin Landsman, MD. Schedule an appointment as soon as possible for a visit in 1 week(s).   Specialty: Family Medicine Contact information: [place] [address] [phone]           Hospital Course: 85 year old male with history of HTN, CVA, CKD-4 PVD with aortofemoral bypass on DAPT and BPH presenting with rectal bleed/hematochezia with progressive weakness for about 2 weeks.  Had presyncope on the day of presentation.  In ED, COVID-19 and influenza PCR negative.  RR 24.  HDS.  98% on RA.  Hgb 5.2.  MCV 106.  K5.3. Cr 6.06.  BUN 46.  Bicarb 17.  Anemia panel normal.  CXR not impressive.  Two units of pRBC ordered.    GI, nephrology and urology consulted.  Patient had 2 units of packed red blood cells with appropriate response.  He had colonoscopy on 1/24 that revealed diverticulosis in the entire examined colon although not able to  visualize the right colon and cecum.  Colonoscopy also revealed prominent internal hemorrhoids and a large flat polyp in the distal transverse colon which has been biopsied.  Pathology negative for high-grade dysplasia, malignancy, hyperplastic and/or adenomatous features.  Eventually, aspirin and Plavix resumed.  Hemoglobin remained stable.  Nephrology consulted for AKI on CKD-4.  Patient did not have indication for urgent dialysis.  Renal function improved.  Nephrology scheduled outpatient follow-up and signed off.  Patient had urethral stricture on admission.  He had urethral dilation and Foley catheter placement by urology on 07/14/2018.  Urology recommended Foley catheter for 1 week.  Foley catheter removed on 07/22/2019.  He passed voiding trial.  He received ceftriaxone for 3 days prophylactically.  Patient had PVCs and intermittent as symptomatic nocturnal bradycardia.  Echocardiogram with EF of 40 to 45%, G2 DD, RA WMA, mod LAE and RVSP to 47.  Home Coreg discontinued.  Cardiology consulted and recommended outpatient event monitor and outpatient follow-up.  Cardiology to arrange this.  Patient was evaluated by PT/OT who recommended home health PT/OT.  See individual problems below for more on hospital course.   Discharge Diagnoses:  Acute blood loss anemia due to GI bleed: presents with painless hematochezia concerning for diverticular bleed.  Anemia panel normal.  Hemodynamically stable now. -Colonoscopy on 1/24 with diverticulosis in the visualized colon, colonic polyps and internal hemorrhoid. -Hgb 11.8 (04/2016)> 5.7 (admit)>2u> 8.4> 8.1> 7.5> 8.5> 8.8. -Received IV Feraheme and ESA on 1/20. -Plavix and aspirin resumed.  H&H remained stable.  GI signed off. -Recheck CBC  at follow-up. -Outpatient follow-up with nephrology.  Sinus bradycardia with questionable type II second-degree AV block on telemetry: Twelve-lead EKG with  with frequent PVCs and compensatory pause.   Coreg  discontinued. -Cardiology to arrange outpatient event monitor and follow-up.  Acute systolic CHF: Echo with EF of 40 to 45%, G2DD, RWMA, mod LAE and RVSP to 47.  Not on diuretics at home.  Appears euvolemic on exam.  -Outpatient follow-up with cardiology.  Presyncope: Likely due to #1.  Echo as below. -Monitor anemia as above  Acute on CKD-4/azotemia: Cr 2.51 (04/2016)> 6.06 (admit)> 7.29>> 5.01> 4.28.  Renal ultrasound consistent with CKD but no hydro or renal obstruction.  Good urine output. -Outpatient follow-up with nephrology -Avoid nephrotoxic meds.  Hypernatremia/hyperkalemia/non-anion gap metabolic acidosis: Likely due to renal failure.  Resolved.  History of left parietal and occipital cortex CVA in 2014: Stable. History of PVD with or for femoral bypass in 2013.  He has been on DAPT -Continue statin, Plavix and aspirin -Advised to follow-up with vascular surgery to discuss about need for DAPT.  BPH/urethral stricture: Foley catheter placed by urology on 1/19 with a plan to continue for 1 week. Received CTX prophylactically for 3 days. Passed voiding trial on 07/22/2019.  Essential hypertension: SBP elevated. -Discharged on home medications except Coreg which was discontinued due to bradycardia.  Thrombocytopenia: Resolved.  Debility/physical deconditioning: Home health PT/OT  Mild leukocytosis/bandemia: Likely demargination.  Resolved.  Chronic pain: -Continue low-dose Lyrica. -Discontinued Zanaflex.  Family communication: Attempted to call patient's daughter for update but no answer.   Discharge Instructions  Discharge Instructions    (HEART FAILURE PATIENTS) Call MD:  Anytime you have any of the following symptoms: 1) 3 pound weight gain in 24 hours or 5 pounds in 1 week 2) shortness of breath, with or without a dry hacking cough 3) swelling in the hands, feet or stomach 4) if you have to sleep on extra pillows at night in order to breathe.   Complete by:  As directed    Call MD for:  difficulty breathing, headache or visual disturbances   Complete by: As directed    Call MD for:  extreme fatigue   Complete by: As directed    Call MD for:  persistant dizziness or light-headedness   Complete by: As directed    Call MD for:  persistant nausea and vomiting   Complete by: As directed    Call MD for:  severe uncontrolled pain   Complete by: As directed    Call MD for:  temperature >100.4   Complete by: As directed    Diet - low sodium heart healthy   Complete by: As directed    Discharge instructions   Complete by: As directed    It has been a pleasure taking care of you! You were hospitalized and treated for rectal bleeding, weakness and kidney failure.  The weakness is likely due to anemia (low blood level) likely from bleeding which is likely due to diverticulosis and internal hemorrhoid.  Now your bleeding seems to have subsided.  Your kidney failure have improved.  We are discharging you to follow-up with your primary care doctor, kidney doctor and cardiologist.  We also encourage you to follow-up with your vascular surgeon to discuss about your blood thinners in the setting of rectal bleeding. Please review your new medication list and the directions before you take your medications.   Take care,   Increase activity slowly   Complete by: As directed  Allergies as of 07/23/2019   No Known Allergies     Medication List    STOP taking these medications   carvedilol 6.25 MG tablet Commonly known as: COREG   tiZANidine 4 MG tablet Commonly known as: ZANAFLEX     TAKE these medications   albuterol 108 (90 Base) MCG/ACT inhaler Commonly known as: VENTOLIN HFA Inhale 1-2 puffs into the lungs every 4 (four) hours as needed for shortness of breath or wheezing.   amLODipine 10 MG tablet Commonly known as: NORVASC Take 10 mg by mouth daily.   aspirin EC 81 MG tablet Take 81 mg by mouth daily.   atorvastatin 20 MG  tablet Commonly known as: LIPITOR Take 20 mg by mouth daily.   clopidogrel 75 MG tablet Commonly known as: PLAVIX Take 1 tablet (75 mg total) by mouth daily with breakfast.   famotidine 20 MG tablet Commonly known as: PEPCID Take 20 mg by mouth daily.   hydrALAZINE 25 MG tablet Commonly known as: APRESOLINE Take 1 tablet (25 mg total) by mouth 2 (two) times daily.   pregabalin 75 MG capsule Commonly known as: LYRICA Take 75 mg by mouth daily.   Vitamin D (Ergocalciferol) 1.25 MG (50000 UNIT) Caps capsule Commonly known as: DRISDOL Take 50,000 Units by mouth every Sunday.       Consultations:  GI, nephrology, urology and cardiology  Procedures/Studies:  1/19-urethral dilation and placement of indwelling Foley catheter  1/20-echocardiogram with EF of 40 to 45%, G2DD, RWMA, mod LAE and RVSP to 47.  1/24-colonoscopy  with diverticulosis in the visualized colon, colonic polyps and internal hemorrhoid.  Pathology negative.  1/26-removal of indwelling Foley catheter   US RENAL  Result Date: 07/15/2019 CLINICAL DATA:  Acute kidney injury. EXAM: RENAL / URINARY TRACT ULTRASOUND COMPLETE COMPARISON:  March 22, 2017. FINDINGS: Right Kidney: Renal measurements: 10.7 x 5.3 x 4.2 cm = volume: 123 mL. Increased echogenicity of renal parenchyma is noted. Two simple cysts are noted, the largest measuring 1.8 cm. No mass or hydronephrosis visualized. Left Kidney: Renal measurements: 8.2 x 4.5 x 4.0 cm = volume: 76 mL. Increased echogenicity of renal parenchyma is noted. 1.2 cm simple cyst is noted. No mass or hydronephrosis visualized. Bladder: Appears normal for degree of bladder distention. Other: None. IMPRESSION: Increased echogenicity of renal parenchyma is noted bilaterally suggesting medical renal disease. Small bilateral renal cysts are noted. No hydronephrosis or renal obstruction is noted. Electronically Signed   By: James  Green Jr M.D.   On: 07/15/2019 16:23   DG Chest  Port 1 View  Result Date: 07/15/2019 CLINICAL DATA:  Recent syncopal episode EXAM: PORTABLE CHEST 1 VIEW COMPARISON:  05/19/2016 FINDINGS: Cardiac shadow is mildly enlarged but accentuated by the portable technique. Small right-sided pleural effusion is noted. There is likely underlying basilar atelectasis. No other focal infiltrate is seen. No bony abnormality is noted. IMPRESSION: Small right pleural effusion with underlying basilar atelectasis. Electronically Signed   By: Mark  Lukens M.D.   On: 07/15/2019 10:54   ECHOCARDIOGRAM COMPLETE  Result Date: 07/16/2019   ECHOCARDIOGRAM REPORT   Patient Name:   Rushil Marken Date of Exam: 07/16/2019 Medical Rec #:  [id number]      Height:       65.0 in Accession #:    [id number]     Weight:       15 6.5 lb Date of Birth:  04/12/1935      BSA:          1.78 m  Patient Age:    23 years       BP:           144/62 mmHg Patient Gender: M              HR:           82 bpm. Exam Location:  Inpatient Procedure: 2D Echo Indications:    Dyspnea 786.09 / R06.00  History:        Patient has prior history of Echocardiogram examinations, most                 recent 05/16/2016. CHF, Stroke; Risk Factors:Hypertension. AKI                 (acute kidney injury)                 CKD (chronic kidney disease).  Sonographer:    Vikki Ports Turrentine Referring Phys: [id number] [place]  1. Left ventricular ejection fraction, by visual estimation, is 40 to 45%. The left ventricle has mild to moderately decreased function. There is no left ventricular hypertrophy.  2. Basal inferolateral segment is abnormal.  3. Left ventricular diastolic parameters are consistent with Grade II diastolic dysfunction (pseudonormalization).  4. The left ventricle demonstrates regional wall motion abnormalities.  5. Global right ventricle has normal systolic function.The right ventricular size is normal. No increase in right ventricular wall thickness.  6. Left atrial size was moderately dilated.  7.  Right atrial size was mildly dilated.  8. Moderate pleural effusion in the left lateral region.  9. The mitral valve is normal in structure. Mild mitral valve regurgitation. No evidence of mitral stenosis. 10. The tricuspid valve is normal in structure. 11. The tricuspid valve is normal in structure. Tricuspid valve regurgitation is mild. 12. The aortic valve is normal in structure. Aortic valve regurgitation is not visualized. No evidence of aortic valve sclerosis or stenosis. 13. The pulmonic valve was normal in structure. Pulmonic valve regurgitation is not visualized. 14. Moderately elevated pulmonary artery systolic pressure. 15. The tricuspid regurgitant velocity is 2.84 m/s, and with an assumed right atrial pressure of 15 mmHg, the estimated right ventricular systolic pressure is moderately elevated at 47.3 mmHg. 16. The inferior vena cava is dilated in size with <50% respiratory variability, suggesting right atrial pressure of 15 mmHg. FINDINGS  Left Ventricle: Left ventricular ejection fraction, by visual estimation, is 40 to 45%. The left ventricle has mild to moderately decreased function. The left ventricle demonstrates regional wall motion abnormalities. There is no left ventricular hypertrophy. Left ventricular diastolic parameters are consistent with Grade II diastolic dysfunction (pseudonormalization). Normal left atrial pressure.  LV Wall Scoring: The basal inferolateral segment is akinetic. Right Ventricle: The right ventricular size is normal. No increase in right ventricular wall thickness. Global RV systolic function is has normal systolic function. The tricuspid regurgitant velocity is 2.84 m/s, and with an assumed right atrial pressure  of 15 mmHg, the estimated right ventricular systolic pressure is moderately elevated at 47.3 mmHg. Left Atrium: Left atrial size was moderately dilated. Right Atrium: Right atrial size was mildly dilated Pericardium: There is no evidence of pericardial  effusion. There is a moderate pleural effusion in the left lateral region. Mitral Valve: The mitral valve is normal in structure. Mild mitral valve regurgitation. No evidence of mitral valve stenosis by observation. Tricuspid Valve: The tricuspid valve is normal in structure. Tricuspid valve regurgitation is mild. Aortic Valve: The aortic valve is normal in structure.  Aortic valve regurgitation is not visualized. The aortic valve is structurally normal, with no evidence of sclerosis or stenosis. Pulmonic Valve: The pulmonic valve was normal in structure. Pulmonic valve regurgitation is not visualized. Pulmonic regurgitation is not visualized. Aorta: The aortic root, ascending aorta and aortic arch are all structurally normal, with no evidence of dilitation or obstruction. Venous: The inferior vena cava is dilated in size with less than 50% respiratory variability, suggesting right atrial pressure of 15 mmHg. IAS/Shunts: No atrial level shunt detected by color flow Doppler. There is no evidence of a patent foramen ovale. No ventricular septal defect is seen or detected. There is no evidence of an atrial septal defect.  LEFT VENTRICLE PLAX 2D LVIDd:         4.00 cm  Diastology LVIDs:         3.30 cm  LV e' lateral:   5.33 cm/s LV PW:         1.00 cm  LV E/e' lateral: 19.5 LV IVS:        1.00 cm  LV e' medial:    3.53 cm/s LVOT diam:     2.10 cm  LV E/e' medial:  29.5 LV SV:         26 ml LV SV Index:   14.22 LVOT Area:     3.46 cm  RIGHT VENTRICLE RV S prime:     12.20 cm/s TAPSE (M-mode): 2.0 cm LEFT ATRIUM             Index       RIGHT ATRIUM           Index LA diam:        3.80 cm 2.13 cm/m  RA Area:     21.00 cm LA Vol (A2C):   64.3 ml 36.07 ml/m RA Volume:   66.30 ml  37.20 ml/m LA Vol (A4C):   88.5 ml 49.65 ml/m LA Biplane Vol: 82.2 ml 46.12 ml/m  AORTIC VALVE LVOT Vmax:   60.10 cm/s LVOT Vmean:  41.400 cm/s LVOT VTI:    0.106 m  AORTA Ao Root diam: 2.80 cm MITRAL VALVE                         TRICUSPID  VALVE MV Area (PHT): 3.68 cm              TR Peak grad:   32.3 mmHg MV PHT:        59.74 msec            TR Vmax:        284.00 cm/s MV Decel Time: 206 msec MV E velocity: 104.00 cm/s 103 cm/s  SHUNTS MV A velocity: 60.00 cm/s  70.3 cm/s Systemic VTI:  0.11 m MV E/A ratio:  1.73        1.5       Systemic Diam: 2.10 cm  Candee Furbish MD Electronically signed by Candee Furbish MD Signature Date/Time: 07/16/2019/1:55:27 PM    Final        Discharge Exam: Vitals:   07/22/19 1624 07/22/19 2317  BP: (!) 148/80 (!) 144/60  Pulse: 61 (!) 55  Resp: 16 20  Temp: 97.9 F (36.6 C) 98.4 F (36.9 C)  SpO2: 98% 97%    GENERAL: No acute distress.  Appears well.  HEENT: MMM.  Vision and hearing grossly intact.  NECK: Supple.  No apparent JVD.  RESP:  No IWOB. Good air movement bilaterally.  CVS:  RRR. Heart sounds normal.  ABD/GI/GU: Bowel sounds present. Soft. Non tender.  MSK/EXT:  Moves extremities. No apparent deformity or edema.  SKIN: no apparent skin lesion or wound NEURO: Awake, alert and oriented self, place and person.  No apparent focal neuro deficit. PSYCH: Calm. Normal affect.   The results of significant diagnostics from this hospitalization (including imaging, microbiology, ancillary and laboratory) are listed below for reference.     Microbiology: Recent Results (from the past 240 hour(s))  Respiratory Panel by RT PCR (Flu A&B, Covid) - Nasopharyngeal Swab     Status: None   Collection Time: 07/15/19 10:38 AM   Specimen: Nasopharyngeal Swab  Result Value Ref Range Status   SARS Coronavirus 2 by RT PCR NEGATIVE NEGATIVE Final    Comment: (NOTE) SARS-CoV-2 target nucleic acids are NOT DETECTED. The SARS-CoV-2 RNA is generally detectable in upper respiratoy specimens during the acute phase of infection. The lowest concentration of SARS-CoV-2 viral copies this assay can detect is 131 copies/mL. A negative result does not preclude SARS-Cov-2 infection and should not be used as the  sole basis for treatment or other patient management decisions. A negative result may occur with  improper specimen collection/handling, submission of specimen other than nasopharyngeal swab, presence of viral mutation(s) within the areas targeted by this assay, and inadequate number of viral copies (<131 copies/mL). A negative result must be combined with clinical observations, patient history, and epidemiological information. The expected result is Negative. Fact Sheet for Patients:  PinkCheek.be Fact Sheet for Healthcare Providers:  GravelBags.it This test is not yet ap proved or cleared by the [place] FDA and  has been authorized for detection and/or diagnosis of SARS-CoV-2 by FDA under an Emergency Use Authorization (EUA). This EUA will remain  in effect (meaning this test can be used) for the duration of the COVID-19 declaration under Section 564(b)(1) of the Act, 21 U.S.C. section 360bbb-3(b)(1), unless the authorization is terminated or revoked sooner.    Influenza A by PCR NEGATIVE NEGATIVE Final   Influenza B by PCR NEGATIVE NEGATIVE Final    Comment: (NOTE) The Xpert Xpress SARS-CoV-2/FLU/RSV assay is intended as an aid in  the diagnosis of influenza from Nasopharyngeal swab specimens and  should not be used as a sole basis for treatment. Nasal washings and  aspirates are unacceptable for Xpert Xpress SARS-CoV-2/FLU/RSV  testing. Fact Sheet for Patients: PinkCheek.be Fact Sheet for Healthcare Providers: GravelBags.it This test is not yet approved or cleared by the [place] FDA and  has been authorized for detection and/or diagnosis of SARS-CoV-2 by  FDA under an Emergency Use Authorization (EUA). This EUA will remain  in effect (meaning this test can be used) for the duration of the  Covid-19 declaration under Section 564(b)(1) of the Act, 21   U.S.C. section 360bbb-3(b)(1), unless the authorization is  terminated or revoked. Performed at [facility], [place] [address] [place], [place] [address]      Labs: BNP (last 3 results) No results for input(s): BNP in the last 8760 hours. Basic Metabolic Panel: Recent Labs  Lab 07/19/19 0540 07/20/19 0444 07/21/19 0326 07/22/19 0314 07/23/19 0203  NA 139 140 140 143 144  K 4.4 4.1 4.1 4.1 4.2  CL 107 109 109 110 111  CO2 21* 20* 19* 22 23  GLUCOSE 86 78 75 101* 104*  BUN 37* 35* 31* 28* 27*  CREATININE 5.34* 5.01* 4.49* 4.47* 4.28*  CALCIUM 8.3* 8.5* 8.7* 8.8* 8.5*  MG 2.3  2.2 2.1 2.3 1.9  PHOS 5.5* 5.1* 4.8* 4.1 3.6   Liver Function Tests: Recent Labs  Lab 07/19/19 0540 07/20/19 0444 07/21/19 0326 07/22/19 0314 07/23/19 0203  ALBUMIN 3.0* 3.0* 3.1* 3.2* 3.0*   No results for input(s): LIPASE, AMYLASE in the last 168 hours. No results for input(s): AMMONIA in the last 168 hours. CBC: Recent Labs  Lab 07/19/19 0540 07/20/19 0444 07/21/19 0326 07/22/19 0314 07/23/19 0203  WBC 11.4* 9.8 9.5 9.9 8.9  HGB 7.5* 8.0* 8.5* 8.8* 8.5*  HCT 24.4* 25.4* 27.2* 28.1* 26.9*  MCV 92.4 93.0 92.2 92.7 92.8  PLT 138* 147* 167 185 186   Cardiac Enzymes: No results for input(s): CKTOTAL, CKMB, CKMBINDEX, TROPONINI in the last 168 hours. BNP: Invalid input(s): POCBNP CBG: No results for input(s): GLUCAP in the last 168 hours. D-Dimer No results for input(s): DDIMER in the last 72 hours. Hgb A1c No results for input(s): HGBA1C in the last 72 hours. Lipid Profile No results for input(s): CHOL, HDL, LDLCALC, TRIG, CHOLHDL, LDLDIRECT in the last 72 hours. Thyroid function studies No results for input(s): TSH, T4TOTAL, T3FREE, THYROIDAB in the last 72 hours.  Invalid input(s): FREET3 Anemia work up No results for input(s): VITAMINB12, FOLATE, FERRITIN, TIBC, IRON, RETICCTPCT in the last 72 hours. Urinalysis    Component Value Date/Time   COLORURINE YELLOW  05/17/2013 0530   APPEARANCEUR CLOUDY (A) 05/17/2013 0530   LABSPEC 1.012 05/17/2013 0530   PHURINE 5.0 05/17/2013 0530   GLUCOSEU NEGATIVE 05/17/2013 0530   HGBUR NEGATIVE 05/17/2013 0530   BILIRUBINUR NEGATIVE 05/17/2013 0530   KETONESUR NEGATIVE 05/17/2013 0530   PROTEINUR 30 (A) 05/17/2013 0530   UROBILINOGEN 0.2 05/17/2013 0530   NITRITE NEGATIVE 05/17/2013 0530   LEUKOCYTESUR MODERATE (A) 05/17/2013 0530   Sepsis Labs Invalid input(s): PROCALCITONIN,  WBC,  LACTICIDVEN   Time coordinating discharge: 45 minutes  SIGNED:  Mercy Riding, MD  [facility] 07/23/2019, 7:42 AM  If 7PM-7AM, please contact night-coverage [email or web address] Password [email or web address]

## 2019-07-30 ENCOUNTER — Other Ambulatory Visit: Payer: Self-pay | Admitting: Nephrology

## 2019-07-30 ENCOUNTER — Ambulatory Visit
Admission: RE | Admit: 2019-07-30 | Discharge: 2019-07-30 | Disposition: A | Discharge status: Home / Self Care | Payer: Medicare HMO | Source: Ambulatory Visit | Attending: Nephrology | Admitting: Nephrology

## 2019-07-30 DIAGNOSIS — R0600 Dyspnea, unspecified: Secondary | ICD-10-CM

## 2019-07-31 ENCOUNTER — Encounter (HOSPITAL_COMMUNITY): Payer: Self-pay

## 2019-07-31 ENCOUNTER — Other Ambulatory Visit: Payer: Self-pay

## 2019-07-31 ENCOUNTER — Inpatient Hospital Stay (HOSPITAL_COMMUNITY)
Admission: EM | Admit: 2019-07-31 | Discharge: 2019-08-04 | DRG: 291 | Disposition: A | Discharge status: Hospice | Payer: Medicare HMO | Attending: Internal Medicine | Admitting: Internal Medicine

## 2019-07-31 ENCOUNTER — Emergency Department (HOSPITAL_COMMUNITY): Payer: Medicare HMO

## 2019-07-31 ENCOUNTER — Other Ambulatory Visit (HOSPITAL_COMMUNITY): Payer: Self-pay

## 2019-07-31 DIAGNOSIS — I509 Heart failure, unspecified: Secondary | ICD-10-CM

## 2019-07-31 DIAGNOSIS — I5021 Acute systolic (congestive) heart failure: Secondary | ICD-10-CM | POA: Diagnosis not present

## 2019-07-31 DIAGNOSIS — I5043 Acute on chronic combined systolic (congestive) and diastolic (congestive) heart failure: Secondary | ICD-10-CM

## 2019-07-31 DIAGNOSIS — E875 Hyperkalemia: Secondary | ICD-10-CM | POA: Diagnosis present

## 2019-07-31 DIAGNOSIS — Z515 Encounter for palliative care: Secondary | ICD-10-CM | POA: Diagnosis not present

## 2019-07-31 DIAGNOSIS — Z8249 Family history of ischemic heart disease and other diseases of the circulatory system: Secondary | ICD-10-CM | POA: Diagnosis not present

## 2019-07-31 DIAGNOSIS — N185 Chronic kidney disease, stage 5: Secondary | ICD-10-CM | POA: Diagnosis present

## 2019-07-31 DIAGNOSIS — N179 Acute kidney failure, unspecified: Secondary | ICD-10-CM | POA: Diagnosis present

## 2019-07-31 DIAGNOSIS — D539 Nutritional anemia, unspecified: Secondary | ICD-10-CM | POA: Diagnosis present

## 2019-07-31 DIAGNOSIS — Z7902 Long term (current) use of antithrombotics/antiplatelets: Secondary | ICD-10-CM | POA: Diagnosis not present

## 2019-07-31 DIAGNOSIS — Z8673 Personal history of transient ischemic attack (TIA), and cerebral infarction without residual deficits: Secondary | ICD-10-CM | POA: Diagnosis not present

## 2019-07-31 DIAGNOSIS — Z66 Do not resuscitate: Secondary | ICD-10-CM | POA: Diagnosis present

## 2019-07-31 DIAGNOSIS — Z79899 Other long term (current) drug therapy: Secondary | ICD-10-CM

## 2019-07-31 DIAGNOSIS — Z20822 Contact with and (suspected) exposure to covid-19: Secondary | ICD-10-CM | POA: Diagnosis present

## 2019-07-31 DIAGNOSIS — I132 Hypertensive heart and chronic kidney disease with heart failure and with stage 5 chronic kidney disease, or end stage renal disease: Secondary | ICD-10-CM | POA: Diagnosis present

## 2019-07-31 DIAGNOSIS — J9601 Acute respiratory failure with hypoxia: Secondary | ICD-10-CM | POA: Diagnosis present

## 2019-07-31 DIAGNOSIS — I5023 Acute on chronic systolic (congestive) heart failure: Secondary | ICD-10-CM | POA: Diagnosis present

## 2019-07-31 DIAGNOSIS — Z95820 Peripheral vascular angioplasty status with implants and grafts: Secondary | ICD-10-CM | POA: Diagnosis not present

## 2019-07-31 DIAGNOSIS — I70209 Unspecified atherosclerosis of native arteries of extremities, unspecified extremity: Secondary | ICD-10-CM | POA: Diagnosis present

## 2019-07-31 DIAGNOSIS — Z7189 Other specified counseling: Secondary | ICD-10-CM

## 2019-07-31 LAB — POCT I-STAT 7, (LYTES, BLD GAS, ICA,H+H)
Acid-base deficit: 4 mmol/L — ABNORMAL HIGH (ref 0.0–2.0)
Bicarbonate: 23.4 mmol/L (ref 20.0–28.0)
Calcium, Ion: 1.31 mmol/L (ref 1.15–1.40)
HCT: 28 % — ABNORMAL LOW (ref 39.0–52.0)
Hemoglobin: 9.5 g/dL — ABNORMAL LOW (ref 13.0–17.0)
O2 Saturation: 97 %
Patient temperature: 97.4
Potassium: 6.1 mmol/L — ABNORMAL HIGH (ref 3.5–5.1)
Sodium: 143 mmol/L (ref 135–145)
TCO2: 25 mmol/L (ref 22–32)
pCO2 arterial: 51.9 mmHg — ABNORMAL HIGH (ref 32.0–48.0)
pH, Arterial: 7.259 — ABNORMAL LOW (ref 7.350–7.450)
pO2, Arterial: 105 mmHg (ref 83.0–108.0)

## 2019-07-31 LAB — COMPREHENSIVE METABOLIC PANEL
ALT: 16 U/L (ref 0–44)
AST: 17 U/L (ref 15–41)
Albumin: 3.6 g/dL (ref 3.5–5.0)
Alkaline Phosphatase: 111 U/L (ref 38–126)
Anion gap: 14 (ref 5–15)
BUN: 39 mg/dL — ABNORMAL HIGH (ref 8–23)
CO2: 20 mmol/L — ABNORMAL LOW (ref 22–32)
Calcium: 9.2 mg/dL (ref 8.9–10.3)
Chloride: 111 mmol/L (ref 98–111)
Creatinine, Ser: 5.21 mg/dL — ABNORMAL HIGH (ref 0.61–1.24)
GFR calc Af Amer: 11 mL/min — ABNORMAL LOW (ref >60)
GFR calc non Af Amer: 9 mL/min — ABNORMAL LOW (ref >60)
Glucose, Bld: 125 mg/dL — ABNORMAL HIGH (ref 70–99)
Potassium: 5.5 mmol/L — ABNORMAL HIGH (ref 3.5–5.1)
Sodium: 145 mmol/L (ref 135–145)
Total Bilirubin: 0.4 mg/dL (ref 0.3–1.2)
Total Protein: 6.7 g/dL (ref 6.5–8.1)

## 2019-07-31 LAB — CBC
HCT: 31.7 % — ABNORMAL LOW (ref 39.0–52.0)
HCT: 29.6 % — ABNORMAL LOW (ref 39.0–52.0)
Hemoglobin: 9.2 g/dL — ABNORMAL LOW (ref 13.0–17.0)
Hemoglobin: 8.5 g/dL — ABNORMAL LOW (ref 13.0–17.0)
MCH: 29.3 pg (ref 26.0–34.0)
MCH: 29.2 pg (ref 26.0–34.0)
MCHC: 29 g/dL — ABNORMAL LOW (ref 30.0–36.0)
MCHC: 28.7 g/dL — ABNORMAL LOW (ref 30.0–36.0)
MCV: 101 fL — ABNORMAL HIGH (ref 80.0–100.0)
MCV: 101.7 fL — ABNORMAL HIGH (ref 80.0–100.0)
Platelets: 171 10*3/uL (ref 150–400)
Platelets: 146 10*3/uL — ABNORMAL LOW (ref 150–400)
RBC: 3.14 MIL/uL — ABNORMAL LOW (ref 4.22–5.81)
RBC: 2.91 MIL/uL — ABNORMAL LOW (ref 4.22–5.81)
RDW: 23.2 % — ABNORMAL HIGH (ref 11.5–15.5)
RDW: 22.9 % — ABNORMAL HIGH (ref 11.5–15.5)
WBC: 10.4 10*3/uL (ref 4.0–10.5)
WBC: 9.1 10*3/uL (ref 4.0–10.5)
nRBC: 0.2 % (ref 0.0–0.2)
nRBC: 0.3 % — ABNORMAL HIGH (ref 0.0–0.2)

## 2019-07-31 LAB — TYPE AND SCREEN
ABO/RH(D): O POS
Antibody Screen: NEGATIVE

## 2019-07-31 LAB — LACTIC ACID, PLASMA: Lactic Acid, Venous: 1.4 mmol/L (ref 0.5–1.9)

## 2019-07-31 LAB — PHOSPHORUS: Phosphorus: 5.4 mg/dL — ABNORMAL HIGH (ref 2.5–4.6)

## 2019-07-31 LAB — TROPONIN I (HIGH SENSITIVITY)
Troponin I (High Sensitivity): 81 ng/L — ABNORMAL HIGH (ref <18)
Troponin I (High Sensitivity): 79 ng/L — ABNORMAL HIGH (ref <18)

## 2019-07-31 LAB — POC SARS CORONAVIRUS 2 AG -  ED: SARS Coronavirus 2 Ag: NEGATIVE

## 2019-07-31 LAB — BRAIN NATRIURETIC PEPTIDE: B Natriuretic Peptide: 3158.1 pg/mL — ABNORMAL HIGH (ref 0.0–100.0)

## 2019-07-31 LAB — CREATININE, SERUM
Creatinine, Ser: 5.29 mg/dL — ABNORMAL HIGH (ref 0.61–1.24)
GFR calc Af Amer: 11 mL/min — ABNORMAL LOW (ref >60)
GFR calc non Af Amer: 9 mL/min — ABNORMAL LOW (ref >60)

## 2019-07-31 LAB — TSH: TSH: 2.233 u[IU]/mL (ref 0.350–4.500)

## 2019-07-31 LAB — RESPIRATORY PANEL BY RT PCR (FLU A&B, COVID)
Influenza A by PCR: NEGATIVE
Influenza B by PCR: NEGATIVE
SARS Coronavirus 2 by RT PCR: NEGATIVE

## 2019-07-31 LAB — MAGNESIUM: Magnesium: 2.8 mg/dL — ABNORMAL HIGH (ref 1.7–2.4)

## 2019-07-31 LAB — AMMONIA: Ammonia: 22 umol/L (ref 9–35)

## 2019-07-31 MED ORDER — ONDANSETRON HCL 4 MG/2ML IJ SOLN: 4.0000 mg | Freq: Four times a day (QID) | INTRAMUSCULAR | Status: DC | PRN

## 2019-07-31 MED ORDER — ALBUTEROL SULFATE HFA 108 (90 BASE) MCG/ACT IN AERS
1.0000 | INHALATION_SPRAY | RESPIRATORY_TRACT | Status: DC | PRN
  Filled 2019-07-31: qty 6.7

## 2019-07-31 MED ORDER — CLOPIDOGREL BISULFATE 75 MG PO TABS
75.0000 mg | ORAL_TABLET | Freq: Every day | ORAL | Status: DC
  Administered 2019-08-01 – 2019-08-04 (×4): 75 mg via ORAL
  Filled 2019-07-31 (×4): qty 1

## 2019-07-31 MED ORDER — ATORVASTATIN CALCIUM 10 MG PO TABS
20.0000 mg | ORAL_TABLET | Freq: Every day | ORAL | Status: DC
  Administered 2019-07-31 – 2019-08-03 (×4): 20 mg via ORAL
  Filled 2019-07-31 (×4): qty 2

## 2019-07-31 MED ORDER — ASPIRIN EC 81 MG PO TBEC
81.0000 mg | DELAYED_RELEASE_TABLET | Freq: Every day | ORAL | Status: DC
  Administered 2019-07-31 – 2019-08-04 (×5): 81 mg via ORAL
  Filled 2019-07-31 (×5): qty 1

## 2019-07-31 MED ORDER — SODIUM CHLORIDE 0.9 % IV SOLN
1.0000 g | Freq: Once | INTRAVENOUS | Status: AC
  Administered 2019-07-31: 19:00:00 1 g via INTRAVENOUS
  Filled 2019-07-31: qty 10

## 2019-07-31 MED ORDER — HEPARIN SODIUM (PORCINE) 5000 UNIT/ML IJ SOLN
5000.0000 [IU] | Freq: Three times a day (TID) | INTRAMUSCULAR | Status: DC
  Administered 2019-07-31 – 2019-08-03 (×8): 5000 [IU] via SUBCUTANEOUS
  Filled 2019-07-31 (×9): qty 1

## 2019-07-31 MED ORDER — ACETAMINOPHEN 325 MG PO TABS: 650.0000 mg | ORAL_TABLET | ORAL | Status: DC | PRN

## 2019-07-31 MED ORDER — FAMOTIDINE 20 MG PO TABS
20.0000 mg | ORAL_TABLET | Freq: Every day | ORAL | Status: DC
  Administered 2019-07-31 – 2019-08-03 (×4): 20 mg via ORAL
  Filled 2019-07-31 (×4): qty 1

## 2019-07-31 MED ORDER — AMLODIPINE BESYLATE 10 MG PO TABS
10.0000 mg | ORAL_TABLET | Freq: Every day | ORAL | Status: DC
  Administered 2019-07-31 – 2019-08-04 (×5): 10 mg via ORAL
  Filled 2019-07-31: qty 2
  Filled 2019-07-31 (×4): qty 1

## 2019-07-31 MED ORDER — SODIUM CHLORIDE 0.9 % IV SOLN: 250.0000 mL | INTRAVENOUS | Status: DC | PRN

## 2019-07-31 MED ORDER — FUROSEMIDE 10 MG/ML IJ SOLN
60.0000 mg | Freq: Once | INTRAMUSCULAR | Status: AC
  Administered 2019-07-31: 22:00:00 60 mg via INTRAVENOUS
  Filled 2019-07-31: qty 6

## 2019-07-31 MED ORDER — HYDRALAZINE HCL 25 MG PO TABS
25.0000 mg | ORAL_TABLET | Freq: Two times a day (BID) | ORAL | Status: DC
  Administered 2019-07-31 – 2019-08-03 (×6): 25 mg via ORAL
  Filled 2019-07-31 (×6): qty 1

## 2019-07-31 MED ORDER — ALBUTEROL SULFATE (2.5 MG/3ML) 0.083% IN NEBU: 2.5000 mg | INHALATION_SOLUTION | RESPIRATORY_TRACT | Status: DC | PRN

## 2019-07-31 MED ORDER — FUROSEMIDE 10 MG/ML IJ SOLN
60.0000 mg | Freq: Once | INTRAMUSCULAR | Status: AC
  Administered 2019-07-31: 18:00:00 60 mg via INTRAVENOUS
  Filled 2019-07-31: qty 6

## 2019-07-31 MED ORDER — IPRATROPIUM-ALBUTEROL 0.5-2.5 (3) MG/3ML IN SOLN
3.0000 mL | Freq: Four times a day (QID) | RESPIRATORY_TRACT | Status: DC
  Administered 2019-07-31 – 2019-08-03 (×12): 3 mL via RESPIRATORY_TRACT
  Filled 2019-07-31 (×10): qty 3

## 2019-07-31 MED ORDER — SODIUM CHLORIDE 0.9% FLUSH
3.0000 mL | Freq: Two times a day (BID) | INTRAVENOUS | Status: DC
  Administered 2019-07-31 – 2019-08-03 (×6): 3 mL via INTRAVENOUS

## 2019-07-31 MED ORDER — ALBUTEROL SULFATE HFA 108 (90 BASE) MCG/ACT IN AERS
4.0000 | INHALATION_SPRAY | Freq: Once | RESPIRATORY_TRACT | Status: AC
  Administered 2019-07-31: 18:00:00 4 via RESPIRATORY_TRACT
  Filled 2019-07-31: qty 6.7

## 2019-07-31 MED ORDER — SODIUM CHLORIDE 0.9% FLUSH: 3.0000 mL | INTRAVENOUS | Status: DC | PRN

## 2019-07-31 NOTE — ED Provider Notes (Signed)
[facility] EMERGENCY DEPARTMENT Provider Note   CSN: [id number] Arrival date & time: 07/31/19  1554     History Chief Complaint  Patient presents with  . Shortness of Breath    Bobby Vega is a 85 y.o. male.  HPI Patient was discharged from the hospital approximately 8 days ago.  At that time he had been admitted for GI bleed.  [name] daughter reports that he has not been well since he left the hospital.  He has been exhibiting some symptoms of lethargy and confusion.  At times he seems to be hallucinating slightly.  She reports at the time of discharge I did discuss fluid overload but no new medications were started and they were to follow-up with the family doctor in a week.  She reports he did follow-up with nephrology Monday this week.  She reports at that time, they were advised that the patient may need dialysis but he is not a good candidate for dialysis.  In the meantime, they were going to be trying medical management.  He has become increasingly short of breath.  His confusion has gotten worse.  Patient denies that he is in any pain.  He does endorse being short of breath.  He endorses swelling of the legs.  His abdomen is somewhat protuberant but he denies it is painful.  He states "I ate a bowl of beans".  Patient seems mild to moderately confused.  He is a poor historian.    Past Medical History:  Diagnosis Date  . Arthritis    hip  . Borderline diabetes   . BPH (benign prostatic hyperplasia)    takes Avodart daily  . Bronchitis    yrs ago  . Cervical spondylosis with myelopathy   . Cervical spondylosis without myelopathy   . Chronic back pain   . CKD (chronic kidney disease), stage III   . Constipation    Metamucil prn  . Diverticulosis   . ED (erectile dysfunction)    takes Cialis as needed as well as Viagra  . Embolism - blood clot    in stomach  . GERD (gastroesophageal reflux disease)    takes Nexium daily  . Hyperlipidemia    takes  Zocor nightly  . Hypertension    takes Losartan daily  . Lumbar spondylosis with myelopathy   . Nocturia   . Pain in joint, hand   . Peripheral vascular disease ([place])   . Stroke ([place])   . Urinary urgency     Patient Active Problem List   Diagnosis Date Noted  . Acute exacerbation of CHF (congestive heart failure) ([place]) 07/31/2019  . Diverticulosis of colon with hemorrhage   . Acute GI bleeding 07/15/2019  . Lower GI bleed 07/15/2019  . AKI (acute kidney injury) ([place]) 05/22/2016  . CKD (chronic kidney disease) stage 3, GFR 30-59 ml/min 05/22/2016  . Acute encephalopathy 05/22/2016  . Acute on chronic diastolic heart failure ([place]) 05/22/2016  . Hypernatremia 05/22/2016  . Syncope 05/22/2016  . History of CVA with residual deficit   . Benign essential HTN   . Dementia without behavioral disturbance ([place])   . PVD (peripheral vascular disease) ([place])   . Diastolic dysfunction   . Urethral stricture   . Benign prostatic hyperplasia without lower urinary tract symptoms   . Leukocytosis   . Dysphagia   . Acute blood loss anemia   . Respiratory distress   . Acute on chronic renal failure ([place])   . Aspiration pneumonia of  both lungs ([place]) 05/13/2016  . Acute hypoxemic respiratory failure ([place]) 05/13/2016  . Pain in limb 06/10/2014  . Numbness-Bilateral Shoulder-Hand 06/10/2014  . Hand weakness 06/04/2013  . Left arm weakness 06/04/2013  . CVA (cerebral vascular accident) ([place]) 05/17/2013  . Other and unspecified hyperlipidemia 05/16/2013  . GERD (gastroesophageal reflux disease)   . Hypertension   . Right arm weakness 05/15/2013  . Open wound of abdominal wall, lateral, without mention of complication 41/[id number]/[id number]  . PVD (peripheral vascular disease) with claudication ([place]) 08/02/2011  . Atherosclerosis of native arteries of the extremities with intermittent claudication 07/10/2011  . Occlusion and stenosis of carotid artery without mention of cerebral infarction 07/10/2011  .  Peripheral vascular disease, unspecified ([place]) 06/26/2011    Past Surgical History:  Procedure Laterality Date  . ABDOMINAL AORTAGRAM N/A 07/04/2011   Procedure: ABDOMINAL Maxcine Ham;  Surgeon: Serafina Mitchell, MD;  Location: [facility] C[facility] LAB;  Service: Cardiovascular;  Laterality: N/A;  . AORTA - BILATERAL FEMORAL ARTERY BYPASS GRAFT  07/13/2011   Procedure: AORTA BIFEMORAL BYPASS GRAFT;  Surgeon: Theotis Burrow, MD;  Location: [facility];  Service: Vascular;  Laterality: Bilateral;  . BIOPSY  07/20/2019   Procedure: BIOPSY;  Surgeon: [name], MD;  Location: [facility];  Service: Endoscopy;;  . COLONOSCOPY WITH PROPOFOL N/A 07/20/2019   Procedure: COLONOSCOPY WITH PROPOFOL;  Surgeon: [name], MD;  Location: [facility] ENDOSCOPY;  Service: Endoscopy;  Laterality: N/A;  . CYSTOSCOPY  07/13/2011   Procedure: CYSTOSCOPY;  Surgeon: Hanley Ben, MD;  Location: [facility];  Service: Urology;  Laterality: N/A;  cystoscopy,dilatation & insertion of councill foley catheter  . LOWER EXTREMITY ANGIOGRAM Bilateral 07/04/2011   Procedure: LOWER EXTREMITY ANGIOGRAM;  Surgeon: Serafina Mitchell, MD;  Location: [facility] CATH LAB;  Service: Cardiovascular;  Laterality: Bilateral;  . SKIN GRAFT  1983       Family History  Problem Relation Age of Onset  . Heart disease Mother        Before age 34  . Heart disease Brother        NOT  before age 44  . Heart attack Brother   . Anesthesia problems Neg Hx   . Hypotension Neg Hx   . Malignant hyperthermia Neg Hx   . Pseudochol deficiency Neg Hx     Social History   Tobacco Use  . Smoking status: Current Some Day Smoker    Years: 0.50    Types: Cigars    Last attempt to quit: 04/29/1992    Years since quitting: 27.2  . Smokeless tobacco: Never Used  Substance Use Topics  . Alcohol use: No  . Drug use: No    Types: Marijuana    Comment: Hx    Home Medications Prior to Admission medications   Medication Sig Start Date End Date Taking? Authorizing Provider    albuterol (VENTOLIN HFA) 108 (90 Base) MCG/ACT inhaler Inhale 1-2 puffs into the lungs every 4 (four) hours as needed for shortness of breath or wheezing. 05/29/19  Yes [provider]  amLODipine (NORVASC) 10 MG tablet Take 10 mg by mouth daily. 04/25/19  Yes [provider]  aspirin EC 81 MG tablet Take 81 mg by mouth daily.   Yes [provider]  atorvastatin (LIPITOR) 20 MG tablet Take 20 mg by mouth daily.   Yes [provider]  clopidogrel (PLAVIX) 75 MG tablet Take 1 tablet (75 mg total) by mouth daily with breakfast. 05/18/13  Yes Rai, Vernelle Emerald, MD  famotidine (  PEPCID) 20 MG tablet Take 20 mg by mouth daily.   Yes [provider]  hydrALAZINE (APRESOLINE) 25 MG tablet Take 1 tablet (25 mg total) by mouth 2 (two) times daily. 05/18/13  Yes Rai, Ripudeep Raliegh Ip, MD  pregabalin (LYRICA) 75 MG capsule Take 75 mg by mouth daily after breakfast.  03/21/19  Yes [provider]  Vitamin D, Ergocalciferol, (DRISDOL) 50000 UNITS CAPS capsule Take 50,000 Units by mouth every Sunday.    Yes [provider]    Allergies    Patient has no known allergies.  Review of Systems   Review of Systems Level 5 caveat cannot obtain review of systems due to patient confusion. Physical Exam Updated Vital Signs BP 127/81   Pulse (!) 56   Temp (!) 97.4 F (36.3 C) (Oral)   Resp 13   SpO2 99%   Physical Exam Constitutional:      Comments: Patient has moderate increased work of breathing.  He is speaking in short sentences but seems confused.  He is somnolent but interacts to verbal stimulus.  HENT:     Head: Normocephalic and atraumatic.  Eyes:     Extraocular Movements: Extraocular movements intact.  Cardiovascular:     Rate and Rhythm: Normal rate and regular rhythm.     Pulses: Normal pulses.  Pulmonary:     Comments: Moderate increased work of breathing.  Patient has crackles to the mid lung fields. Abdominal:     Comments: Abdomen is  protuberant but soft.  No guarding no pain.  Genitourinary:    Penis: Normal.   Musculoskeletal:     Cervical back: Neck supple.     Comments: 2+ pitting edema bilateral lower extremities.  Calves nontender.  Skin:    General: Skin is warm and dry.     Comments: Skin is warm and dry.  Patient is quite pale in appearance.  Neurological:     Comments: Patient is confused and seems lethargic.  He does answer some questions that are appropriate.  He does not appear to be actively hallucinating but is not talking much.  No focal motor deficits.     ED Results / Procedures / Treatments   Labs (all labs ordered are listed, but only abnormal results are displayed) Labs Reviewed  COMPREHENSIVE METABOLIC PANEL - Abnormal; Notable for the following components:      Result Value   Potassium 5.5 (*)    CO2 20 (*)    Glucose, Bld 125 (*)    BUN 39 (*)    Creatinine, Ser 5.21 (*)    GFR calc non Af Amer 9 (*)    GFR calc Af Amer 11 (*)    All other components within normal limits  CBC - Abnormal; Notable for the following components:   RBC 3.14 (*)    Hemoglobin 9.2 (*)    HCT 31.7 (*)    MCV 101.0 (*)    MCHC 29.0 (*)    RDW 23.2 (*)    All other components within normal limits  BRAIN NATRIURETIC PEPTIDE - Abnormal; Notable for the following components:   B Natriuretic Peptide 3,158.1 (*)    All other components within normal limits  MAGNESIUM - Abnormal; Notable for the following components:   Magnesium 2.8 (*)    All other components within normal limits  PHOSPHORUS - Abnormal; Notable for the following components:   Phosphorus 5.4 (*)    All other components within normal limits  POCT I-STAT 7, (LYTES, BLD  GAS, ICA,H+H) - Abnormal; Notable for the following components:   pH, Arterial 7.259 (*)    pCO2 arterial 51.9 (*)    Acid-base deficit 4.0 (*)    Potassium 6.1 (*)    HCT 28.0 (*)    Hemoglobin 9.5 (*)    All other components within normal limits  TROPONIN I (HIGH  SENSITIVITY) - Abnormal; Notable for the following components:   Troponin I (High Sensitivity) 81 (*)    All other components within normal limits  TROPONIN I (HIGH SENSITIVITY) - Abnormal; Notable for the following components:   Troponin I (High Sensitivity) 79 (*)    All other components within normal limits  RESPIRATORY PANEL BY RT PCR (FLU A&B, COVID)  LACTIC ACID, PLASMA  AMMONIA  LACTIC ACID, PLASMA  BLOOD GAS, ARTERIAL  POC SARS CORONAVIRUS 2 AG -  ED  TYPE AND SCREEN    EKG EKG Interpretation  Date/Time:  Thursday July 31 2019 16:53:58 EST Ventricular Rate:  86 PR Interval:    QRS Duration: 78 QT Interval:  400 QTC Calculation: 459 R Axis:   62 Text Interpretation: Sinus rhythm Multiple ventricular premature complexes Borderline low voltage, extremity leads Anteroseptal infarct, old Nonspecific T abnormalities, lateral leads agree, no sig change from old Confirmed by Charlesetta Shanks [phone]) on 07/31/2019 5:06:10 PM   Radiology DG Chest 2 View  Result Date: 07/30/2019 CLINICAL DATA:  Per order- Dyspnea Dyspnea on exertion; crackles on left. Dyspnea, unspecified type EXAM: CHEST - 2 VIEW COMPARISON:  Radiograph 07/15/2019 FINDINGS: 1 normal cardiac silhouette. Bilateral small small to moderate pleural effusions. Upper lungs are clear. Degenerative osteophytosis of the spine. IMPRESSION: Bilateral pleural effusions.  No airspace disease. Electronically Signed   By: Suzy Bouchard M.D.   On: 07/30/2019 16:40   DG Chest Port 1 View  Result Date: 07/31/2019 CLINICAL DATA:  Shortness of breath EXAM: PORTABLE CHEST 1 VIEW COMPARISON:  July 30, 2019 FINDINGS: The heart size and mediastinal contours are within normal limits. There is mild hazy perihilar fluffy airspace opacity seen, slightly worsened from the prior exam. Small bilateral pleural effusions are seen. IMPRESSION: Slight interval worsening in pulmonary edema and small bilateral pleural effusions. The Electronically  Signed   By: Prudencio Pair M.D.   On: 07/31/2019 18:11    Procedures Procedures (including critical care time) CRITICAL CARE Performed by: Charlesetta Shanks   Total critical care time: 45 minutes  Critical care time was exclusive of separately billable procedures and treating other patients.  Critical care was necessary to treat or prevent imminent or life-threatening deterioration.  Critical care was time spent personally by me on the following activities: development of treatment plan with patient and/or surrogate as well as nursing, discussions with consultants, evaluation of patient's response to treatment, examination of patient, obtaining history from patient or surrogate, ordering and performing treatments and interventions, ordering and review of laboratory studies, ordering and review of radiographic studies, pulse oximetry and re-evaluation of patient's condition. Medications Ordered in ED Medications  ipratropium-albuterol (DUONEB) 0.5-2.5 (3) MG/3ML nebulizer solution 3 mL (3 mLs Nebulization Given 07/31/19 1951)  calcium gluconate 1 g in sodium chloride 0.9 % 100 mL IVPB (0 g Intravenous Stopped 07/31/19 1915)  albuterol (VENTOLIN HFA) 108 (90 Base) MCG/ACT inhaler 4 puff (4 puffs Inhalation Given 07/31/19 1817)  furosemide (LASIX) injection 60 mg (60 mg Intravenous Given 07/31/19 1817)    ED Course  I have reviewed the triage vital signs and the nursing notes.  Pertinent labs & imaging  results that were available during my care of the patient were reviewed by me and considered in my medical decision making (see chart for details).  Clinical Course as of Jul 30 2114  Thu Jul 31, 2019  1007 [name] daughter, [name], updated.  We reviewed the patient's condition, his known history of renal failure.  She will talk to other families regarding DNR status.   [MP]  2006 Consult: Dr. Mindi Junker for admission.   [MP]    Clinical Course User Index [MP] Charlesetta Shanks, MD   MDM  Rules/Calculators/A&P                      Patient presents as outlined above.  Physical exam and diagnostic studies are consistent with congestive heart failure.  Patient has at baseline, severe kidney disease.  Today is slightly worse than baseline.  Lasix 60 mg IV administered to attempt diuresis.  Patient also placed on BiPAP for added respiratory support.  He is tolerating the BiPAP well.  He seems slightly more alert.  Airway is protected and he is not too somnolent for BiPAP at this time.  Patient also had mild elevation in potassium.  He was given a run of calcium gluconate and an albuterol inhaler.  EKG does not show significant changes.  We will continue to monitor.  Patient will be admitted to medical service. Final Clinical Impression(s) / ED Diagnoses Final diagnoses:  Acute congestive heart failure, unspecified heart failure type ([place])  Chronic renal failure, stage 5 ([place])    Rx / DC Orders ED Discharge Orders    None       Charlesetta Shanks, MD 07/31/19 2118

## 2019-07-31 NOTE — ED Notes (Signed)
RT at bedside to set up bipap.

## 2019-07-31 NOTE — ED Notes (Signed)
All meds given per [facility]. Name/DOB verified with pt. Pt had small BM. Pt turned, cleaned and repositioned. New linens provided. Pt voided 250 cc of clear, yellow urine via condom catheter.  Dr. Mindi Junker notified. Per Dr. Mindi Junker, ok for pt to not to have foley placed.  Labs drawn, labeled with 2 pt identifiers, and sent to lab

## 2019-07-31 NOTE — ED Notes (Addendum)
Ammonia and troponin collected, labeled with 2 pt identifiers, and sent to lab

## 2019-07-31 NOTE — ED Notes (Signed)
Daughter would like an update  Her name is  chandra [phone]

## 2019-07-31 NOTE — ED Notes (Signed)
Report called and given to Judie Petit, RN on 3E

## 2019-07-31 NOTE — H&P (Signed)
History and Physical    [facility]ega IFO:[id number] DOB: [birth year] DOA: 07/31/2019  PCP: Lin Landsman, MD  Patient coming from: Home  I have personally briefly reviewed patient's old medical records in [place]  Chief Complaint: Shortness of breath  HPI: Bobby Vega is a 85 y.o. male with medical history significant of HTN, CKD 4, HFrEF, Stroke, BPH,PVD with aorta-femoral bypass presenting with a chief complaint of shortness of breath, found to have CHF exacerbation.  The patient has difficulty communicating at the time of my exam given he is on BiPAP, history obtained with the aid from family (daughter Bobby Vega) as well as chart review in addition to interview of patient.  Explained since his recent discharge he has been relatively well, though began feeling like he was experiencing shortness of breath 1 to 2 weeks ago began to feel volume overloaded around this time. Of note, patiently recently admitted 119/21 with rectal bleeding secondary to diverticular bleed, though denies bleeding in the interim.  During his hospitalization he had an AKI on CKD 4 and nephrology was consulted, after discussion they felt like patient was not a candidate for dialysis and similar discussions were held as outpatient on follow-up on the interim.  During his hospitalization also patient had a TTE performed which noted an LVEF of 40 to 67%, grade 2 diastolic dysfunction with some regional wall motion abnormalities.  He does not take any home diuretics. Denies chest pain, abdominal pain, diarrhea, dysuria, hematuria, melena, medic easier, hematosis, nausea, vomiting.  ED Course: On arrival to the ED pt was  noted to be saturating 84% on room air, 4 L nasal cannula applied to improve oxygen saturations, patiently subsequently transferred for to BiPAP for continued dyspnea.  Other vitals notable for occasional bradycardia down to 52, hypertensive up to 159/80.   Labs and imaging studies acquired. Initial  labwork notable for potassium 5.5, bicarb 20, creatinine 5.21.  Lactate 2.1.  WBC 10.4, hemoglobin 9.2.  Troponin 81.  BNP 3158.  Mag 2.8, Phos 5.4.   Arterial blood gas obtained with pH 7.259, PCO2 51.9, PO2 1 5.  Imaging studies notable for cxr with pulmonary edema and bilateral pleural effusions. Patient was administered 60mg  IV lasix.  Given concern for systolic heart failure exacerbation, decision was made to admit.   Review of Systems: As per HPI otherwise 10 point review of systems negative.   Past Medical History:  Diagnosis Date  . Arthritis    hip  . Borderline diabetes   . BPH (benign prostatic hyperplasia)    takes Avodart daily  . Bronchitis    yrs ago  . Cervical spondylosis with myelopathy   . Cervical spondylosis without myelopathy   . Chronic back pain   . CKD (chronic kidney disease), stage III   . Constipation    Metamucil prn  . Diverticulosis   . ED (erectile dysfunction)    takes Cialis as needed as well as Viagra  . Embolism - blood clot    in stomach  . GERD (gastroesophageal reflux disease)    takes Nexium daily  . Hyperlipidemia    takes Zocor nightly  . Hypertension    takes Losartan daily  . Lumbar spondylosis with myelopathy   . Nocturia   . Pain in joint, hand   . Peripheral vascular disease ([place])   . Stroke ([place])   . Urinary urgency     Past Surgical History:  Procedure Laterality Date  . ABDOMINAL AORTAGRAM N/A 07/04/2011   Procedure:  ABDOMINAL AORTAGRAM;  Surgeon: Serafina Mitchell, MD;  Location: [facility] CATH LAB;  Service: Cardiovascular;  Laterality: N/A;  . AORTA - BILATERAL FEMORAL ARTERY BYPASS GRAFT  07/13/2011   Procedure: AORTA BIFEMORAL BYPASS GRAFT;  Surgeon: Theotis Burrow, MD;  Location: [place];  Service: Vascular;  Laterality: Bilateral;  . BIOPSY  07/20/2019   Procedure: BIOPSY;  Surgeon: [name], MD;  Location: [place];  Service: Endoscopy;;  . COLONOSCOPY WITH PROPOFOL N/A 07/20/2019   Procedure: COLONOSCOPY WITH  PROPOFOL;  Surgeon: [name], MD;  Location: [facility] ENDOSCOPY;  Service: Endoscopy;  Laterality: N/A;  . CYSTOSCOPY  07/13/2011   Procedure: CYSTOSCOPY;  Surgeon: Hanley Ben, MD;  Location: [place];  Service: Urology;  Laterality: N/A;  cystoscopy,dilatation & insertion of councill foley catheter  . LOWER EXTREMITY ANGIOGRAM Bilateral 07/04/2011   Procedure: LOWER EXTREMITY ANGIOGRAM;  Surgeon: Serafina Mitchell, MD;  Location: [facility] CATH LAB;  Service: Cardiovascular;  Laterality: Bilateral;  . SKIN GRAFT  1983     reports that he has been smoking cigars. He has smoked for the past 0.50 years. He has never used smokeless tobacco. He reports that he does not drink alcohol or use drugs.  No Known Allergies  Family History  Problem Relation Age of Onset  . Heart disease Mother        Before age 49  . Heart disease Brother        NOT  before age 41  . Heart attack Brother   . Anesthesia problems Neg Hx   . Hypotension Neg Hx   . Malignant hyperthermia Neg Hx   . Pseudochol deficiency Neg Hx     Prior to Admission medications   Medication Sig Start Date End Date Taking? Authorizing Provider  albuterol (VENTOLIN HFA) 108 (90 Base) MCG/ACT inhaler Inhale 1-2 puffs into the lungs every 4 (four) hours as needed for shortness of breath or wheezing. 05/29/19  Yes [provider]  amLODipine (NORVASC) 10 MG tablet Take 10 mg by mouth daily. 04/25/19  Yes [provider]  aspirin EC 81 MG tablet Take 81 mg by mouth daily.   Yes [provider]  atorvastatin (LIPITOR) 20 MG tablet Take 20 mg by mouth daily.   Yes [provider]  clopidogrel (PLAVIX) 75 MG tablet Take 1 tablet (75 mg total) by mouth daily with breakfast. 05/18/13  Yes Rai, Ripudeep K, MD  famotidine (PEPCID) 20 MG tablet Take 20 mg by mouth daily.   Yes [provider]  hydrALAZINE (APRESOLINE) 25 MG tablet Take 1 tablet (25 mg total) by mouth 2 (two) times daily. 05/18/13  Yes Rai, Ripudeep  Raliegh Ip, MD  pregabalin (LYRICA) 75 MG capsule Take 75 mg by mouth daily after breakfast.  03/21/19  Yes [provider]  Vitamin D, Ergocalciferol, (DRISDOL) 50000 UNITS CAPS capsule Take 50,000 Units by mouth every Sunday.    Yes [provider]    Physical Exam: Vitals:   07/31/19 1800 07/31/19 1815 07/31/19 1845 07/31/19 1936  BP: 134/77 (!) 131/93 125/79 135/89  Pulse: 77 (!) 58 74   Resp: 13 13 12 15   Temp:      TempSrc:      SpO2: 99% 93% 95%    Constitutional: NAD, calm, somewhat drowsy Eyes: PERRL, lids and conjunctivae normal ENMT: Mucous membranes are moist. Posterior pharynx clear of any exudate or lesions. Neck: normal, supple, no masses, no thyromegaly Respiratory: Diffuse rales bilaterally, no wheezing.  Increased respiratory effort. No  accessory muscle use.  On BiPAP. Cardiovascular: Regular rate and rhythm, no murmurs / rubs / gallops. No extremity edema. 2+ pedal pulses. No carotid bruits.  Abdomen: Midline scar noted.  No tenderness, no masses palpated. No hepatosplenomegaly. Bowel sounds positive.  Musculoskeletal: no clubbing / cyanosis. No joint deformity upper and lower extremities. Good ROM, no contractures. Normal muscle tone.  Skin: no rashes, lesions, ulcers. No induration Neurologic: CN 2-12 grossly intact. Sensation intact. Strength 5/5 in all 4.  Slightly drowsy Psychiatric: Slightly drowsy, though insight and judgment appear intact.Marland Kitchenlert and oriented x 3. Normal mood.   Labs on Admission: I have personally reviewed following labs and imaging studies  CBC: Recent Labs  Lab 07/31/19 1632 07/31/19 1815  WBC 10.4  --   HGB 9.2* 9.5*  HCT 31.7* 28.0*  MCV 101.0*  --   PLT 171  --    Basic Metabolic Panel: Recent Labs  Lab 07/31/19 1632 07/31/19 1752 07/31/19 1815  NA 145  --  143  K 5.5*  --  6.1*  CL 111  --   --   CO2 20*  --   --   GLUCOSE 125*  --   --   BUN 39*  --   --   CREATININE 5.21*  --   --   CALCIUM 9.2  --   --     MG  --  2.8*  --   PHOS  --  5.4*  --    GFR: Estimated Creatinine Clearance: 9.9 mL/min (A) (by C-G formula based on SCr of 5.21 mg/dL (H)). Liver Function Tests: Recent Labs  Lab 07/31/19 1632  AST 17  ALT 16  ALKPHOS 111  BILITOT 0.4  PROT 6.7  ALBUMIN 3.6   No results for input(s): LIPASE, AMYLASE in the last 168 hours. No results for input(s): AMMONIA in the last 168 hours. Coagulation Profile: No results for input(s): INR, PROTIME in the last 168 hours. Cardiac Enzymes: No results for input(s): CKTOTAL, CKMB, CKMBINDEX, TROPONINI in the last 168 hours. BNP (last 3 results) No results for input(s): PROBNP in the last 8760 hours. HbA1C: No results for input(s): HGBA1C in the last 72 hours. CBG: No results for input(s): GLUCAP in the last 168 hours. Lipid Profile: No results for input(s): CHOL, HDL, LDLCALC, TRIG, CHOLHDL, LDLDIRECT in the last 72 hours. Thyroid Function Tests: No results for input(s): TSH, T4TOTAL, FREET4, T3FREE, THYROIDAB in the last 72 hours. Anemia Panel: No results for input(s): VITAMINB12, FOLATE, FERRITIN, TIBC, IRON, RETICCTPCT in the last 72 hours. Urine analysis:    Component Value Date/Time   COLORURINE YELLOW 05/17/2013 0530   APPEARANCEUR CLOUDY (A) 05/17/2013 0530   LABSPEC 1.012 05/17/2013 0530   PHURINE 5.0 05/17/2013 0530   GLUCOSEU NEGATIVE 05/17/2013 0530   HGBUR NEGATIVE 05/17/2013 0530   BILIRUBINUR NEGATIVE 05/17/2013 0530   KETONESUR NEGATIVE 05/17/2013 0530   PROTEINUR 30 (A) 05/17/2013 0530   UROBILINOGEN 0.2 05/17/2013 0530   NITRITE NEGATIVE 05/17/2013 0530   LEUKOCYTESUR MODERATE (A) 05/17/2013 0530    Radiological Exams on Admission: DG Chest 2 View  Result Date: 07/30/2019 CLINICAL DATA:  Per order- Dyspnea Dyspnea on exertion; crackles on left. Dyspnea, unspecified type EXAM: CHEST - 2 VIEW COMPARISON:  Radiograph 07/15/2019 FINDINGS: 1 normal cardiac silhouette. Bilateral small small to moderate pleural  effusions. Upper lungs are clear. Degenerative osteophytosis of the spine. IMPRESSION: Bilateral pleural effusions.  No airspace disease. Electronically Signed   By: Helane Gunther.D.  On: 07/30/2019 16:40   DG Chest Port 1 View  Result Date: 07/31/2019 CLINICAL DATA:  Shortness of breath EXAM: PORTABLE CHEST 1 VIEW COMPARISON:  July 30, 2019 FINDINGS: The heart size and mediastinal contours are within normal limits. There is mild hazy perihilar fluffy airspace opacity seen, slightly worsened from the prior exam. Small bilateral pleural effusions are seen. IMPRESSION: Slight interval worsening in pulmonary edema and small bilateral pleural effusions. The Electronically Signed   By: Prudencio Pair M.D.   On: 07/31/2019 18:11    EKG: Independently reviewed.  Sinus rhythm, rate 86, multiple PVCs noted, Q waves noted V1 V2.  Assessment/Plan Active Problems:   Acute exacerbation of CHF (congestive heart failure) (HCC)  Systolic,  Decompensated Congestive Heart Failure, Stable, Active condition Acute hypoxic respiratory failure secondary to the above --Status: NYHA class 2-3; last TTE on 07/16/19 showed EF 40 to 64%, grade 2 diastolic dysfunction, no significant valvular abnormality -Pt reports progressive dyspnea in recent weeks and sensation of volume overload, symptoms of orthopnea -Volume overloaded in ED: Hypoxic, tachypneic, increased work of breathing, bibasilar crackles, trace edema, interstitial edema on CXR along with pleural effusion and elevated BNP (3158) --Etiology: Patient not on any diuretic at home, poor diet compliance, ACS, PE, PNA,   -- Meds:   Diuretic: Status post IV Lasix 60 mg x 1, will redose another 60 mg IV x1, monitor response and redose as appropriate  BB (if EF </= 40% once euvolemic): Holding beta-blocker currently due to bradycardia, patient not on any home beta-blocker  Spironolactone (if EF < 30% with stable renal function and K levels): Not indicated at this  time due to EF 40 to 45% as well as CKD 5 with hyperkalemia  -- PRN: NTG, K replacement   -- Orders: Strict I/Os, Daily weights, replace electrolytes as needed, fluid restriction  -- Monitor: O2 status, UOP, telemetry, EKG PRN   -Currently on BiPAP given respiratory distress in setting of diffuse severe pulmonary edema.  Anticipate this will improve pending diuresis.  Wean as tolerated.  -- Labs: CBC, BMP, Mag   -Monitor potassium and magnesium while diuresing   -Trend troponins to peak, slightly elevated likely in setting of demand ischemia given heart failure exacerbation  -- Imaging: Holding repeat TTE for now given it was just performed 2 weeks ago -Consider cardiology consult in a.m.  AKI on CKD 4 Hyperkalemia Hypomagnesemia Hypophosphatemia -Patient seems to have progressed to CKD 5 now unfortunately, was evaluated by nephrology during his last hospitalization as well as an outpatient and is not a candidate for dialysis.  After discussion with patient and his daughter, Bobby Vega, he does not wish to proceed with dialysis at this time.  Mild hyperkalemia noted on presentation, there are no signs of EKG changes. Plan: -Possibly component of cardiorenal syndrome given degree of volume overload.  Will diurese for his CHF exacerbation, monitor renal function, anticipating possible improvement.  This will also have the effect of correcting his hyperkalemia.  Close attention to other electrolyte abnormalities.  May need Phos binder. -Consider nephro consult in a.m. -Avoid nephrotoxins, renally dose medications. -Holding home pregabalin due to renal dysfunction  Peripheral vascular disease -Continue ASA, Lipitor, Plavix  Hypertension -Continue amlodipine, hydralazine for now  Goals of care discussion -Held goals of care discussion with patient and daughter Bobby Vega, at this time they are understanding that he is renal function is worsening as well as his heart function which is causing his  current presentation.  We have converted his  current CODE STATUS to DNR in accordance with his wishes.  He does not wish to proceed with dialysis or any other aggressive interventions.  Though he is currently okay with BiPAP and IV medications. -Ongoing goals of care discussions are needed, recommend palliative consult in a.m.   DVT prophylaxis: Heparin Code Status: DNR ongoing goals of care discussions Family Communication: Spoke with daughter Bobby Vega, phone number [phone] Disposition Plan: Likely discharge home in 3-4 Consults called: None Admission status: Inpatient telemetry   S. Carolyne Fiscal, DO [facility] Pager [phone]  If 7PM-7AM, please contact night-coverage [email or web address] Password [facility]  07/31/2019, 7:55 PM

## 2019-07-31 NOTE — ED Notes (Signed)
Per RT, ok to bring pt up onm [facility]. RT will meet in [place] room 28 with Bipap.

## 2019-07-31 NOTE — ED Notes (Signed)
ED Provider at bedside. 

## 2019-07-31 NOTE — ED Notes (Signed)
Pt transported to [facility] with all belongings via cart transport on continuous monitors with this RN. Pt alert, able to speak in full sentences. Equal rise and fall of chest noted

## 2019-07-31 NOTE — ED Notes (Signed)
Assumed care of pt. Pt resting on cart in NAD. VSS on monitors. Call light within reach. Hourly rounds completed. Will continue to monitor

## 2019-07-31 NOTE — ED Triage Notes (Signed)
Pt from home with c.o shortness of breath, 86% on room air, 4L [place] applied to get oxygen saturations to 94%. Pt recently admitted for rectal bleeding, pt denies bleeding at this time. Pt alert , disoriented to situation.

## 2019-08-01 DIAGNOSIS — E875 Hyperkalemia: Secondary | ICD-10-CM

## 2019-08-01 DIAGNOSIS — N185 Chronic kidney disease, stage 5: Secondary | ICD-10-CM

## 2019-08-01 DIAGNOSIS — I5043 Acute on chronic combined systolic (congestive) and diastolic (congestive) heart failure: Secondary | ICD-10-CM

## 2019-08-01 LAB — BASIC METABOLIC PANEL
Anion gap: 14 (ref 5–15)
BUN: 39 mg/dL — ABNORMAL HIGH (ref 8–23)
CO2: 20 mmol/L — ABNORMAL LOW (ref 22–32)
Calcium: 9.1 mg/dL (ref 8.9–10.3)
Chloride: 112 mmol/L — ABNORMAL HIGH (ref 98–111)
Creatinine, Ser: 5.18 mg/dL — ABNORMAL HIGH (ref 0.61–1.24)
GFR calc Af Amer: 11 mL/min — ABNORMAL LOW (ref >60)
GFR calc non Af Amer: 10 mL/min — ABNORMAL LOW (ref >60)
Glucose, Bld: 118 mg/dL — ABNORMAL HIGH (ref 70–99)
Potassium: 6.1 mmol/L — ABNORMAL HIGH (ref 3.5–5.1)
Sodium: 146 mmol/L — ABNORMAL HIGH (ref 135–145)

## 2019-08-01 LAB — LACTIC ACID, PLASMA: Lactic Acid, Venous: 1.8 mmol/L (ref 0.5–1.9)

## 2019-08-01 MED ORDER — SODIUM ZIRCONIUM CYCLOSILICATE 10 G PO PACK
10.0000 g | PACK | Freq: Every day | ORAL | Status: DC
  Administered 2019-08-02 – 2019-08-04 (×3): 10 g via ORAL
  Filled 2019-08-01 (×5): qty 1

## 2019-08-01 MED ORDER — FUROSEMIDE 10 MG/ML IJ SOLN
120.0000 mg | Freq: Three times a day (TID) | INTRAVENOUS | Status: DC
  Administered 2019-08-01 – 2019-08-04 (×9): 120 mg via INTRAVENOUS
  Filled 2019-08-01: qty 10
  Filled 2019-08-01 (×5): qty 12
  Filled 2019-08-01 (×4): qty 10
  Filled 2019-08-01 (×2): qty 12

## 2019-08-01 NOTE — Progress Notes (Signed)
[facility] Progress Note    Assessment/ Plan:   1. Acute on CKD IV/V: Suspect continued progression of chronic renal disease in setting of cardiorenal syndrome from CHF exacerbation. Cr 5.29>5.18 s/p 60mg  IV lasix x 2 overnight. Only 854mL UOP in last 24h. Likely need high dose. Not a candidate for dialysis due to quality of life. Patient also continues to decline dialysis. Recommend palliative consult for [place] discussion. - increase lasix to 120 IV lasix q8 - monitor I/O's, daily weights - daily renal function panel - monitor electrolytes - avoid nephrotoxic agents - continue goals of care discussion - lokelma for hyperK  2. Acute Hypoxic Respiratory Failure: Likely 2/2 CHF Exacerbation and CKD: BNP 3158, CXR intersitial edema with pleural effusion on admission. COVID/Flu negative. Continues to require BiPAP. Currently on 7-8L O2 with O2 sats in 86-89%. - continue lasix as above - continue O2/Bipap as needed, wean as tolerated - per primary  3. Hyperkalemia: K 6.1. S/p Calcium gluconate x 1 - Lokelma 10mg  daily  4. Macrocytic Anemia: Hgb 8.5. Last iron studies on 07/15/19 were WNL (Iron 61, TIBC 315, Iron Sat 19, Ferritin 24), Folate 7.3, Vitamin B12 WNL.  - per primary - continue to monitor Hgb - transfusion threshold <8 due to his significant CVD?  5. HTN:  - cont Hydralazine and Amlodipine  HPI:   Mr. Bobby Vega is a 85 y.o. male with PMH significant for HTN, CKD IV, HFrEF (EF 40-45%), stroke, BPH, PVD with aorta-femoral bypass admitted for acute hypoxic respiratory distress 2/2 to CHF exacerbation.  Patient noted to have O2 sat of 84% on room air that improved with BiPAP.  Consult placed by the request of Dr. Ree Kida for acute on chronic kidney disease. Patient has known CKD IV diagnosed during last hospitalization. On admission patient was found to have Cr 5.21 (up from 4.4 at end of January), K 6.1.   Patient was treated IV 60mg  lasix overnight for his CHF  exacerbation with some improvement in Cr (5.29>5.18) but limited UOP.   Patient admitted on 07/15/19 to 07/23/19 for acute GI bleed. He has had AKI on CKD-4 during admission. He had renal ultrasound notable for increased echogenicity of renal parenchyma is noted bilaterally suggesting medical renal disease. Small bilateral renal cysts are noted. No hydronephrosis or renal obstruction is noted. No urgent dialysis required at that time and he was instructed to follow up outpatient. Patient also continues to decline dialysis.   Objective:   BP (!) 142/70   Pulse (!) 58   Temp (!) 97.3 F (36.3 C) (Oral)   Resp 20   Wt 71.7 kg   SpO2 95%   BMI 25.51 kg/m   Intake/Output Summary (Last 24 hours) at 08/01/2019 1205 Last data filed at 08/01/2019 1140 Gross per 24 hour  Intake 600 ml  Output 700 ml  Net -100 ml   Weight change:   Physical Exam: General: ill appearing older AA male, breathing heavily on exam with Coats in place  CV: regular rate and rhythm without murmurs, rubs, or gallops, 1+ pitting edema in LE Lungs: crackles in lungs, increased work of breathing on 7-8L O2 Abdomen: soft, non-tender, distended Skin: warm, dry Extremities: warm and well perfused Neuro: Alert and orientedxe, speech normal, very tired appearing on exam    Imaging: DG Chest 2 View  Result Date: 07/30/2019 CLINICAL DATA:  Per order- Dyspnea Dyspnea on exertion; crackles on left. Dyspnea, unspecified type EXAM: CHEST - 2 VIEW COMPARISON:  Radiograph 07/15/2019  FINDINGS: 1 normal cardiac silhouette. Bilateral small small to moderate pleural effusions. Upper lungs are clear. Degenerative osteophytosis of the spine. IMPRESSION: Bilateral pleural effusions.  No airspace disease. Electronically Signed   By: Suzy Bouchard M.D.   On: 07/30/2019 16:40   DG Chest Port 1 View  Result Date: 07/31/2019 CLINICAL DATA:  Shortness of breath EXAM: PORTABLE CHEST 1 VIEW COMPARISON:  July 30, 2019 FINDINGS: The heart size and  mediastinal contours are within normal limits. There is mild hazy perihilar fluffy airspace opacity seen, slightly worsened from the prior exam. Small bilateral pleural effusions are seen. IMPRESSION: Slight interval worsening in pulmonary edema and small bilateral pleural effusions. The Electronically Signed   By: Prudencio Pair M.D.   On: 07/31/2019 18:11    Labs: BMET Recent Labs  Lab 07/31/19 1632 07/31/19 1752 07/31/19 1815 07/31/19 2215 08/01/19 0012  NA 145  --  143  --  146*  K 5.5*  --  6.1*  --  6.1*  CL 111  --   --   --  112*  CO2 20*  --   --   --  20*  GLUCOSE 125*  --   --   --  118*  BUN 39*  --   --   --  39*  CREATININE 5.21*  --   --  5.29* 5.18*  CALCIUM 9.2  --   --   --  9.1  PHOS  --  5.4*  --   --   --    CBC Recent Labs  Lab 07/31/19 1632 07/31/19 1815 07/31/19 2215  WBC 10.4  --  9.1  HGB 9.2* 9.5* 8.5*  HCT 31.7* 28.0* 29.6*  MCV 101.0*  --  101.7*  PLT 171  --  146*    Medications:    . amLODipine  10 mg Oral Daily  . aspirin EC  81 mg Oral Daily  . atorvastatin  20 mg Oral Daily  . clopidogrel  75 mg Oral Q breakfast  . famotidine  20 mg Oral Daily  . heparin  5,000 Units Subcutaneous Q8H  . hydrALAZINE  25 mg Oral BID  . ipratropium-albuterol  3 mL Nebulization Q6H  . sodium chloride flush  3 mL Intravenous Q12H     Mina Marble, DO [facility], PGY2 08/01/2019, 12:05 PM

## 2019-08-01 NOTE — Progress Notes (Signed)
RT NOTES: Patient short of breath with sats of 82%. Placed patient on bipap 16/8 60%. Sats now 95% with decreasing work of breathing. Will continue to monitor.

## 2019-08-01 NOTE — Progress Notes (Signed)
PROGRESS NOTE    Bobby Vega  NIO:[id number] DOB: [birth year] DOA: 07/31/2019 PCP: Lin Landsman, MD   Brief Narrative:  HPI On 07/31/2019 by Dr. Clinton Sawyer Tanvir Vega is a 85 y.o. male with medical history significant of HTN, CKD 4, HFrEF, Stroke, BPH,PVD with aorta-femoral bypass presenting with a chief complaint of shortness of breath, found to have CHF exacerbation.  The patient has difficulty communicating at the time of my exam given he is on BiPAP, history obtained with the aid from family (daughter Bobby Vega) as well as chart review in addition to interview of patient.  Explained since his recent discharge he has been relatively well, though began feeling like he was experiencing shortness of breath 1 to 2 weeks ago began to feel volume overloaded around this time. Of note, patiently recently admitted 119/21 with rectal bleeding secondary to diverticular bleed, though denies bleeding in the interim.  During his hospitalization he had an AKI on CKD 4 and nephrology was consulted, after discussion they felt like patient was not a candidate for dialysis and similar discussions were held as outpatient on follow-up on the interim.  During his hospitalization also patient had a TTE performed which noted an LVEF of 40 to 81%, grade 2 diastolic dysfunction with some regional wall motion abnormalities.  He does not take any home diuretics. Denies chest pain, abdominal pain, diarrhea, dysuria, hematuria, melena, medic easier, hematosis, nausea, vomiting.  Interim history Admitted for CHF exacerbation with AKI on CKD. Nephro and cardiology consulted. Palliative care also consulted. Assessment & Plan   Acute systolic congestive heart failure -Patient reports progressive dyspnea in recent weeks with volume overload and orthopnea -Echocardiogram 07/16/2019 showed an EF of 40 to 82%, grade 2 diastolic dysfunction, no significant valvular abnormality. -Patient was noted to be hypoxic, tachypneic with  increased work of breathing on admission along with chest x-ray findings worsening pulmonary edema, bilateral pleural effusions -BNP 3158 -Cardiology consulted and appreciated -Continue IV Lasix -Monitor intake and output, daily weights  Acute hypoxemic respiratory failure -Secondary to the above -Patient was noted to have an SPO2 of 86% on room air -He initially was placed on nasal cannula however required BiPAP overnight -Will attempt to wean as possible -COVID-19 negative, influenza PCR unremarkable  Chronic kidney disease, stage V -Creatinine upon admission 5.29- appears to be at baseline since the start of the year. Labs available from 2017 showing creatinine 2.5. -Nephrology consulted and appreciated -Patient does not wish for hemodialysis -Continue to monitor intake and output  Hyperkalemia -Continue diuresis -Likely secondary to kidney disease -no EKG changes -will order lokelma -continue to monitor   Hyperphosphatemia -Likely secondary to kidney disease  Peripheral vascular disease -Continue aspirin, Plavix, statin  Essential hypertension -Continue amlodipine, hydralazine  Goals of care discussion -Patient currently DNR -Admitting physician had discussion with patient's daughter, Bobby Vega.  Family understands that patient's renal function is worsening along with his heart failure.  Currently he is a DNR.  Patient does not wish to proceed with dialysis or aggressive interventions.  He is okay with BiPAP and IV medications. -Palliative care consulted and appreciated  DVT Prophylaxis  heparin  Code Status: DNR  Family Communication: None at bedside  Disposition Plan: Admitted. Patient admitted from home for CHF exacerbation. Cardiology, nephrology, palliative care consulted. Dispo TBD.  Consultants Cardiology Nephrology Palliative care  Procedures  None  Antibiotics   Anti-infectives (From admission, onward)   None      Subjective:   Bobby Vega  seen and examined today.  Was wearing BiPAP and wanted to eat. Denied chest pain. Continues to have shortness of breath. Denies abdominal pain, N/V/D/C, headache.    Objective:   Vitals:   08/01/19 0756 08/01/19 0800 08/01/19 1000 08/01/19 1137  BP:  134/76 (!) 142/70   Pulse:  87 (!) 58   Resp:  18 20   Temp: 97.6 F (36.4 C)   (!) 97.3 F (36.3 C)  TempSrc: Axillary   Oral  SpO2:  98% 95%   Weight:        Intake/Output Summary (Last 24 hours) at 08/01/2019 1316 Last data filed at 08/01/2019 1259 Gross per 24 hour  Intake 900 ml  Output 700 ml  Net 200 ml   Filed Weights   08/01/19 0101  Weight: 71.7 kg    Exam  General: Well developed, chronically ill-appearing, NAD  HEENT: NCAT,  mucous membranes moist.   Cardiovascular: S1 S2 auscultated, RRR  Respiratory: diminished breath sounds  Abdomen: Soft, nontender, nondistended, + bowel sounds  Extremities: warm dry without cyanosis clubbing  Neuro: AAOx3, nonfocal  Data Reviewed: I have personally reviewed following labs and imaging studies  CBC: Recent Labs  Lab 07/31/19 1632 07/31/19 1815 07/31/19 2215  WBC 10.4  --  9.1  HGB 9.2* 9.5* 8.5*  HCT 31.7* 28.0* 29.6*  MCV 101.0*  --  101.7*  PLT 171  --  254*   Basic Metabolic Panel: Recent Labs  Lab 07/31/19 1632 07/31/19 1752 07/31/19 1815 07/31/19 2215 08/01/19 0012  NA 145  --  143  --  146*  K 5.5*  --  6.1*  --  6.1*  CL 111  --   --   --  112*  CO2 20*  --   --   --  20*  GLUCOSE 125*  --   --   --  118*  BUN 39*  --   --   --  39*  CREATININE 5.21*  --   --  5.29* 5.18*  CALCIUM 9.2  --   --   --  9.1  MG  --  2.8*  --   --   --   PHOS  --  5.4*  --   --   --    GFR: Estimated Creatinine Clearance: 9.9 mL/min (A) (by C-G formula based on SCr of 5.18 mg/dL (H)). Liver Function Tests: Recent Labs  Lab 07/31/19 1632  AST 17  ALT 16  ALKPHOS 111  BILITOT 0.4  PROT 6.7  ALBUMIN 3.6   No results for input(s): LIPASE, AMYLASE in the  last 168 hours. Recent Labs  Lab 07/31/19 1948  AMMONIA 22   Coagulation Profile: No results for input(s): INR, PROTIME in the last 168 hours. Cardiac Enzymes: No results for input(s): CKTOTAL, CKMB, CKMBINDEX, TROPONINI in the last 168 hours. BNP (last 3 results) No results for input(s): PROBNP in the last 8760 hours. HbA1C: No results for input(s): HGBA1C in the last 72 hours. CBG: No results for input(s): GLUCAP in the last 168 hours. Lipid Profile: No results for input(s): CHOL, HDL, LDLCALC, TRIG, CHOLHDL, LDLDIRECT in the last 72 hours. Thyroid Function Tests: Recent Labs    07/31/19 2215  TSH 2.233   Anemia Panel: No results for input(s): VITAMINB12, FOLATE, FERRITIN, TIBC, IRON, RETICCTPCT in the last 72 hours. Urine analysis:    Component Value Date/Time   COLORURINE YELLOW 05/17/2013 0530   APPEARANCEUR CLOUDY (A) 05/17/2013 0530   LABSPEC 1.012 05/17/2013 0530   PHURINE 5.0  05/17/2013 0530   GLUCOSEU NEGATIVE 05/17/2013 0530   HGBUR NEGATIVE 05/17/2013 0530   BILIRUBINUR NEGATIVE 05/17/2013 0530   KETONESUR NEGATIVE 05/17/2013 0530   PROTEINUR 30 (A) 05/17/2013 0530   UROBILINOGEN 0.2 05/17/2013 0530   NITRITE NEGATIVE 05/17/2013 0530   LEUKOCYTESUR MODERATE (A) 05/17/2013 0530   Sepsis Labs: @LABRCNTIP (procalcitonin:4,lacticidven:4)  ) Recent Results (from the past 240 hour(s))  Respiratory Panel by RT PCR (Flu A&B, Covid) - Nasopharyngeal Swab     Status: None   Collection Time: 07/31/19  6:16 PM   Specimen: Nasopharyngeal Swab  Result Value Ref Range Status   SARS Coronavirus 2 by RT PCR NEGATIVE NEGATIVE Final    Comment: (NOTE) SARS-CoV-2 target nucleic acids are NOT DETECTED. The SARS-CoV-2 RNA is generally detectable in upper respiratoy specimens during the acute phase of infection. The lowest concentration of SARS-CoV-2 viral copies this assay can detect is 131 copies/mL. A negative result does not preclude SARS-Cov-2 infection and should  not be used as the sole basis for treatment or other patient management decisions. A negative result may occur with  improper specimen collection/handling, submission of specimen other than nasopharyngeal swab, presence of viral mutation(s) within the areas targeted by this assay, and inadequate number of viral copies (<131 copies/mL). A negative result must be combined with clinical observations, patient history, and epidemiological information. The expected result is Negative. Fact Sheet for Patients:  PinkCheek.be Fact Sheet for Healthcare Providers:  GravelBags.it This test is not yet ap proved or cleared by the [place] FDA and  has been authorized for detection and/or diagnosis of SARS-CoV-2 by FDA under an Emergency Use Authorization (EUA). This EUA will remain  in effect (meaning this test can be used) for the duration of the COVID-19 declaration under Section 564(b)(1) of the Act, 21 U.S.C. section 360bbb-3(b)(1), unless the authorization is terminated or revoked sooner.    Influenza A by PCR NEGATIVE NEGATIVE Final   Influenza B by PCR NEGATIVE NEGATIVE Final    Comment: (NOTE) The Xpert Xpress SARS-CoV-2/FLU/RSV assay is intended as an aid in  the diagnosis of influenza from Nasopharyngeal swab specimens and  should not be used as a sole basis for treatment. Nasal washings and  aspirates are unacceptable for Xpert Xpress SARS-CoV-2/FLU/RSV  testing. Fact Sheet for Patients: PinkCheek.be Fact Sheet for Healthcare Providers: GravelBags.it This test is not yet approved or cleared by the [place] FDA and  has been authorized for detection and/or diagnosis of SARS-CoV-2 by  FDA under an Emergency Use Authorization (EUA). This EUA will remain  in effect (meaning this test can be used) for the duration of the  Covid-19 declaration under Section 564(b)(1)  of the Act, 21  U.S.C. section 360bbb-3(b)(1), unless the authorization is  terminated or revoked. Performed at [facility], [place] [address], [place], [place] [address]       Radiology Studies: DG Chest 2 View  Result Date: 07/30/2019 CLINICAL DATA:  Per order- Dyspnea Dyspnea on exertion; crackles on left. Dyspnea, unspecified type EXAM: CHEST - 2 VIEW COMPARISON:  Radiograph 07/15/2019 FINDINGS: 1 normal cardiac silhouette. Bilateral small small to moderate pleural effusions. Upper lungs are clear. Degenerative osteophytosis of the spine. IMPRESSION: Bilateral pleural effusions.  No airspace disease. Electronically Signed   By: Suzy Bouchard M.D.   On: 07/30/2019 16:40   DG Chest Port 1 View  Result Date: 07/31/2019 CLINICAL DATA:  Shortness of breath EXAM: PORTABLE CHEST 1 VIEW COMPARISON:  July 30, 2019 FINDINGS: The heart  size and mediastinal contours are within normal limits. There is mild hazy perihilar fluffy airspace opacity seen, slightly worsened from the prior exam. Small bilateral pleural effusions are seen. IMPRESSION: Slight interval worsening in pulmonary edema and small bilateral pleural effusions. The Electronically Signed   By: Prudencio Pair M.D.   On: 07/31/2019 18:11     Scheduled Meds: . amLODipine  10 mg Oral Daily  . aspirin EC  81 mg Oral Daily  . atorvastatin  20 mg Oral Daily  . clopidogrel  75 mg Oral Q breakfast  . famotidine  20 mg Oral Daily  . heparin  5,000 Units Subcutaneous Q8H  . hydrALAZINE  25 mg Oral BID  . ipratropium-albuterol  3 mL Nebulization Q6H  . sodium chloride flush  3 mL Intravenous Q12H   Continuous Infusions: . sodium chloride       LOS: 1 day   Time Spent in minutes   45 minutes  Megan Presti D.O. on 08/01/2019 at 1:16 PM  Between 7am to 7pm - Please see pager noted on [email or web address]  After 7pm go to [email or web address]  And Vega for the night coverage person covering for me after hours  [facility] Office   [phone]

## 2019-08-01 NOTE — Consult Note (Signed)
Cardiology Consultation:   Patient ID: Bobby Vega; [id number]; 12/28/1934   Admit date: 07/31/2019 Date of Consult: 08/01/2019  Primary Care Provider: Lin Landsman, MD Primary Cardiologist: Fransico Him, MD Primary Electrophysiologist:  None  Patient Profile:   Bobby Vega is a 85 y.o. male with a PMH of chronic combined CHF (EF 40-45%, G2DD), HTN, HLD, PAD s/p AoBiFem, DM type 2, CVA, BPH, CVD V (reportedly not a candidate for HD), and recent admission for GI bleed, who is being seen today for the evaluation of CHF at the request of Dr. Ree Kida.  History of Present Illness:   Bobby Vega was brought to the hospital 07/31/2019 by his daughter to evaluate increased lethargy, confusion, and SOB. He was felt to be volume overloaded on exam. He was hypoxic and tachypneic on arrival, managed briefly with BiPAP, now on O2 via [place]. CXR showed worsening pulmonary edema with small bilateral pleural effusion compared to 2/3. BNP elevated to 3100. He was admitted to medicine for acute on chronic combined CHF. He received IV lasix 60mg  x2 yesterday. UOP documentation is likely incomplete over the past 24 hours. Weight appears to be stable at 71kg compared to 07/22/19. Unfortunately his Cr is >5, up a bit from baseline in the 4's. Cardiology asked to evaluate for CHF.  He was last evaluated by cardiology during his recent admission to [facility] from 07/15/19-07/23/19 for the evaluation of bradycardia and frequent PVCs. Bradycardia occurred while sleeping and patient was asymptomatic. He was recommended for an outpatient event monitor to evaluate for symptomatic bradycardia and PVC burden. Echo at that time revealed EF 40-45% (normal in 2017), G2DD, basal inferolateral HK, moderate LAE, moderate pleural effusion, mild MR/TR, and moderate pulmHTN. He was recommended for medical management of his CHF as he was felt to be high risk for progression to ESRD requiring dialysis if ischemic evaluation with a coronary  CTA vs LHC was pursued. He was continued on aspirin without plavix given GIB and aggressive risk factor modifications. Unable to add ACEi/ARB due to CKD. He was evaluated by nephrology during recent admission and deemed a poor candidate for HD going forward. This was reportedly reiterated at an outpatient visit since admission.   At the time of this evaluation he appears SOB despite BiPAP. He is just getting settled back in bed after using the bed pan and having his sheets changed. He reports SOB and LE edema at home. No complaints of chest pain, dizziness, lightheadedness, syncope, or palpitations.   Past Medical History:  Diagnosis Date  . Arthritis    hip  . Borderline diabetes   . BPH (benign prostatic hyperplasia)    takes Avodart daily  . Bronchitis    yrs ago  . Cervical spondylosis with myelopathy   . Cervical spondylosis without myelopathy   . Chronic back pain   . CKD (chronic kidney disease), stage III   . Constipation    Metamucil prn  . Diverticulosis   . ED (erectile dysfunction)    takes Cialis as needed as well as Viagra  . Embolism - blood clot    in stomach  . GERD (gastroesophageal reflux disease)    takes Nexium daily  . Hyperlipidemia    takes Zocor nightly  . Hypertension    takes Losartan daily  . Lumbar spondylosis with myelopathy   . Nocturia   . Pain in joint, hand   . Peripheral vascular disease ([place])   . Stroke ([place])   . Urinary urgency  Past Surgical History:  Procedure Laterality Date  . ABDOMINAL AORTAGRAM N/A 07/04/2011   Procedure: ABDOMINAL Maxcine Ham;  Surgeon: Serafina Mitchell, MD;  Location: [facility] CATH LAB;  Service: Cardiovascular;  Laterality: N/A;  . AORTA - BILATERAL FEMORAL ARTERY BYPASS GRAFT  07/13/2011   Procedure: AORTA BIFEMORAL BYPASS GRAFT;  Surgeon: Theotis Burrow, MD;  Location: [place];  Service: Vascular;  Laterality: Bilateral;  . BIOPSY  07/20/2019   Procedure: BIOPSY;  Surgeon: [name], MD;  Location: [place];   Service: Endoscopy;;  . COLONOSCOPY WITH PROPOFOL N/A 07/20/2019   Procedure: COLONOSCOPY WITH PROPOFOL;  Surgeon: [name], MD;  Location: [facility] ENDOSCOPY;  Service: Endoscopy;  Laterality: N/A;  . CYSTOSCOPY  07/13/2011   Procedure: CYSTOSCOPY;  Surgeon: Hanley Ben, MD;  Location: [place];  Service: Urology;  Laterality: N/A;  cystoscopy,dilatation & insertion of councill foley catheter  . LOWER EXTREMITY ANGIOGRAM Bilateral 07/04/2011   Procedure: LOWER EXTREMITY ANGIOGRAM;  Surgeon: Serafina Mitchell, MD;  Location: [facility] CATH LAB;  Service: Cardiovascular;  Laterality: Bilateral;  . SKIN GRAFT  1983     Home Medications:  Prior to Admission medications   Medication Sig Start Date End Date Taking? Authorizing Provider  albuterol (VENTOLIN HFA) 108 (90 Base) MCG/ACT inhaler Inhale 1-2 puffs into the lungs every 4 (four) hours as needed for shortness of breath or wheezing. 05/29/19  Yes [provider]  amLODipine (NORVASC) 10 MG tablet Take 10 mg by mouth daily. 04/25/19  Yes [provider]  aspirin EC 81 MG tablet Take 81 mg by mouth daily.   Yes [provider]  atorvastatin (LIPITOR) 20 MG tablet Take 20 mg by mouth daily.   Yes [provider]  clopidogrel (PLAVIX) 75 MG tablet Take 1 tablet (75 mg total) by mouth daily with breakfast. 05/18/13  Yes Rai, Ripudeep K, MD  famotidine (PEPCID) 20 MG tablet Take 20 mg by mouth daily.   Yes [provider]  hydrALAZINE (APRESOLINE) 25 MG tablet Take 1 tablet (25 mg total) by mouth 2 (two) times daily. 05/18/13  Yes Rai, Ripudeep Raliegh Ip, MD  pregabalin (LYRICA) 75 MG capsule Take 75 mg by mouth daily after breakfast.  03/21/19  Yes [provider]  Vitamin D, Ergocalciferol, (DRISDOL) 50000 UNITS CAPS capsule Take 50,000 Units by mouth every Sunday.    Yes [provider]    Inpatient Medications: Scheduled Meds: . amLODipine  10 mg Oral Daily  . aspirin EC  81 mg Oral Daily  .  atorvastatin  20 mg Oral Daily  . clopidogrel  75 mg Oral Q breakfast  . famotidine  20 mg Oral Daily  . heparin  5,000 Units Subcutaneous Q8H  . hydrALAZINE  25 mg Oral BID  . ipratropium-albuterol  3 mL Nebulization Q6H  . sodium chloride flush  3 mL Intravenous Q12H   Continuous Infusions: . sodium chloride     PRN Meds: sodium chloride, acetaminophen, albuterol, ondansetron (ZOFRAN) IV, sodium chloride flush  Allergies:   No Known Allergies  Social History:   Social History   Socioeconomic History  . Marital status: Widowed    Spouse name: Not on file  . Number of children: Not on file  . Years of education: Not on file  . Highest education level: Not on file  Occupational History  . Not on file  Tobacco Use  . Smoking status: Current Some Day Smoker    Years: 0.50    Types: Cigars  Last attempt to quit: 04/29/1992    Years since quitting: 27.2  . Smokeless tobacco: Never Used  Substance and Sexual Activity  . Alcohol use: No  . Drug use: No    Types: Marijuana    Comment: Hx  . Sexual activity: Not on file  Other Topics Concern  . Not on file  Social History Narrative  . Not on file   Social Determinants of Health   Financial Resource Strain:   . Difficulty of Paying Living Expenses: Not on file  Food Insecurity:   . Worried About Charity fundraiser in the Last Year: Not on file  . Ran Out of Food in the Last Year: Not on file  Transportation Needs:   . Lack of Transportation (Medical): Not on file  . Lack of Transportation (Non-Medical): Not on file  Physical Activity:   . Days of Exercise per Week: Not on file  . Minutes of Exercise per Session: Not on file  Stress:   . Feeling of Stress : Not on file  Social Connections:   . Frequency of Communication with Friends and Family: Not on file  . Frequency of Social Gatherings with Friends and Family: Not on file  . Attends Religious Services: Not on file  . Active Member of Clubs or Organizations:  Not on file  . Attends Archivist Meetings: Not on file  . Marital Status: Not on file  Intimate Partner Violence:   . Fear of Current or Ex-Partner: Not on file  . Emotionally Abused: Not on file  . Physically Abused: Not on file  . Sexually Abused: Not on file    Family History:    Family History  Problem Relation Age of Onset  . Heart disease Mother        Before age 63  . Heart disease Brother        NOT  before age 74  . Heart attack Brother   . Anesthesia problems Neg Hx   . Hypotension Neg Hx   . Malignant hyperthermia Neg Hx   . Pseudochol deficiency Neg Hx      ROS:  Please see the history of present illness.  ROS  All other ROS reviewed and negative.     Physical Exam/Data:   Vitals:   08/01/19 0756 08/01/19 0800 08/01/19 1000 08/01/19 1137  BP:  134/76 (!) 142/70   Pulse:  87 (!) 58   Resp:  18 20   Temp: 97.6 F (36.4 C)   (!) 97.3 F (36.3 C)  TempSrc: Axillary   Oral  SpO2:  98% 95%   Weight:        Intake/Output Summary (Last 24 hours) at 08/01/2019 1341 Last data filed at 08/01/2019 1259 Gross per 24 hour  Intake 900 ml  Output 700 ml  Net 200 ml   Filed Weights   08/01/19 0101  Weight: 71.7 kg   Body mass index is 25.51 kg/m.  General:  Appears SOB despite BiPAP HEENT: sclera anicteric  Neck: no JVD Vascular: No carotid bruits Cardiac:  normal S1, S2; RRR; no murmurs,  Lungs:  Diminished breath sounds bilaterally without overt wheezing, rhonchi or rales; mildly increased WOB despite BiPAP  Abd: NABS, soft, nontender, no hepatomegaly Ext: 1+ LE edema Musculoskeletal:  No deformities, BUE and BLE strength normal and equal Skin: warm and dry  Neuro:  CNs 2-12 intact, no focal abnormalities noted Psych:  Normal affect   EKG:  The EKG was personally  reviewed and demonstrates:  NSR, rate 86 bpm, PVC q4th beat, non-specific T wave abnormalities, no STE/D Telemetry:  Telemetry was personally reviewed and demonstrates:  Sinus rhythm  with frequent PVCs, one 3 beat run of NSVT.   Relevant CV Studies: Echocardiogram 07/16/19: 1. Left ventricular ejection fraction, by visual estimation, is 40 to  45%. The left ventricle has mild to moderately decreased function. There  is no left ventricular hypertrophy.  2. Basal inferolateral segment is abnormal.  3. Left ventricular diastolic parameters are consistent with Grade II  diastolic dysfunction (pseudonormalization).  4. The left ventricle demonstrates regional wall motion abnormalities.  5. Global right ventricle has normal systolic function.The right  ventricular size is normal. No increase in right ventricular wall  thickness.  6. Left atrial size was moderately dilated.  7. Right atrial size was mildly dilated.  8. Moderate pleural effusion in the left lateral region.  9. The mitral valve is normal in structure. Mild mitral valve  regurgitation. No evidence of mitral stenosis.  10. The tricuspid valve is normal in structure.  11. The tricuspid valve is normal in structure. Tricuspid valve  regurgitation is mild.  12. The aortic valve is normal in structure. Aortic valve regurgitation is  not visualized. No evidence of aortic valve sclerosis or stenosis.  13. The pulmonic valve was normal in structure. Pulmonic valve  regurgitation is not visualized.  14. Moderately elevated pulmonary artery systolic pressure.  15. The tricuspid regurgitant velocity is 2.84 m/s, and with an assumed  right atrial pressure of 15 mmHg, the estimated right ventricular systolic  pressure is moderately elevated at 47.3 mmHg.  16. The inferior vena cava is dilated in size with <50% respiratory  variability, suggesting right atrial pressure of 15 mmHg.   Laboratory Data:  Chemistry Recent Labs  Lab 07/31/19 1632 07/31/19 1815 07/31/19 2215 08/01/19 0012  NA 145 143  --  146*  K 5.5* 6.1*  --  6.1*  CL 111  --   --  112*  CO2 20*  --   --  20*  GLUCOSE 125*  --   --  118*    BUN 39*  --   --  39*  CREATININE 5.21*  --  5.29* 5.18*  CALCIUM 9.2  --   --  9.1  GFRNONAA 9*  --  9* 10*  GFRAA 11*  --  11* 11*  ANIONGAP 14  --   --  14    Recent Labs  Lab 07/31/19 1632  PROT 6.7  ALBUMIN 3.6  AST 17  ALT 16  ALKPHOS 111  BILITOT 0.4   Hematology Recent Labs  Lab 07/31/19 1632 07/31/19 1815 07/31/19 2215  WBC 10.4  --  9.1  RBC 3.14*  --  2.91*  HGB 9.2* 9.5* 8.5*  HCT 31.7* 28.0* 29.6*  MCV 101.0*  --  101.7*  MCH 29.3  --  29.2  MCHC 29.0*  --  28.7*  RDW 23.2*  --  22.9*  PLT 171  --  146*   Cardiac EnzymesNo results for input(s): TROPONINI in the last 168 hours. No results for input(s): TROPIPOC in the last 168 hours.  BNP Recent Labs  Lab 07/31/19 1752  BNP 3,158.1*    DDimer No results for input(s): DDIMER in the last 168 hours.  Radiology/Studies:  DG Chest 2 View  Result Date: 07/30/2019 CLINICAL DATA:  Per order- Dyspnea Dyspnea on exertion; crackles on left. Dyspnea, unspecified type EXAM: CHEST - 2 VIEW COMPARISON:  Radiograph 07/15/2019 FINDINGS: 1 normal cardiac silhouette. Bilateral small small to moderate pleural effusions. Upper lungs are clear. Degenerative osteophytosis of the spine. IMPRESSION: Bilateral pleural effusions.  No airspace disease. Electronically Signed   By: Suzy Bouchard M.D.   On: 07/30/2019 16:40   DG Chest Port 1 View  Result Date: 07/31/2019 CLINICAL DATA:  Shortness of breath EXAM: PORTABLE CHEST 1 VIEW COMPARISON:  July 30, 2019 FINDINGS: The heart size and mediastinal contours are within normal limits. There is mild hazy perihilar fluffy airspace opacity seen, slightly worsened from the prior exam. Small bilateral pleural effusions are seen. IMPRESSION: Slight interval worsening in pulmonary edema and small bilateral pleural effusions. The Electronically Signed   By: Prudencio Pair M.D.   On: 07/31/2019 18:11    Assessment and Plan:   1. Acute on chronic combined CHF: patient presented with  lethargy, SOB, and confusion. BNP elevated to 3100s. CXR with pulmonary edema and small bilateral effusions. He was given IV lasix 60mg  x2 doses yesterday with minimal UOP. His Cr is 5, up from baseline 4. Seen by nephrology and not a candidate for HD; recommended for more aggressive lasix- IV 120mg  q8hr. He has not had an ischemic evaluation and is not a candidate for invasive procedures due to CKD as contrast load would almost certainly push him over the edge to ESRD. He is now back on BiPAP with increased WOB - Agree with palliative care consult - unfortunately we do not have anything further to add to this gentleman's care.  - Continue diuresis per nephrology - Continue to monitor strict I&Os and daily weights. - Continue aspirin, plavix, and statin for now for presumed CAD - if comfort measures pursued, would be reasonable to stop.   2. HTN: BP stable - Continue amlodipine  3. PAD: s/p Aortobifemoral bypass. - Continue aspirin, plavix, and statin for now for presumed CAD - if comfort measures pursued, would be reasonable to stop.     For questions or updates, please contact [place] Please consult [email or web address] for contact info under Cardiology/STEMI.   Signed, Abigail Butts, PA-C  08/01/2019 1:41 PM [phone]  Attending Note:   The patient was seen and examined.  Agree with assessment and plan as noted above.  Changes made to the above note as needed.  Patient seen and independently examined with Roby Lofts, PA .   We discussed all aspects of the encounter. I agree with the assessment and plan as stated above.  1.   Acute on chronic combined CHF:   Pt presented with increasing shortness of breath, lethergy, confusion. Pulmonary edema,  Increasing creatinine .   Cr went from 4 - 5. He has been seen by nephrology and is not a candidate for HD. His prognosis is very poor He is on BIPAP -  And still dyspneac Is on high doses of lasix with minimal urine output.    Volume overloaded.   + pitting edema   He has been seen by Palliative care .  Unfortunately , we do not have anything further to add He needs comfort care   Cardiology will sign off Comfort care is appropriate.     I have spent a total of 40 minutes with patient reviewing hospital  notes , telemetry, EKGs, labs and examining patient as well as establishing an assessment and plan that was discussed with the patient. > 50% of time was spent in direct patient care.    Thayer Headings, Brooke Bonito., MD, [facility] 08/01/2019,  2:54 PM [address]  [place] Pager [phone]

## 2019-08-02 DIAGNOSIS — N185 Chronic kidney disease, stage 5: Secondary | ICD-10-CM

## 2019-08-02 DIAGNOSIS — I509 Heart failure, unspecified: Secondary | ICD-10-CM

## 2019-08-02 DIAGNOSIS — Z515 Encounter for palliative care: Secondary | ICD-10-CM

## 2019-08-02 DIAGNOSIS — Z7189 Other specified counseling: Secondary | ICD-10-CM

## 2019-08-02 LAB — BASIC METABOLIC PANEL
Anion gap: 11 (ref 5–15)
BUN: 43 mg/dL — ABNORMAL HIGH (ref 8–23)
CO2: 23 mmol/L (ref 22–32)
Calcium: 8.9 mg/dL (ref 8.9–10.3)
Chloride: 109 mmol/L (ref 98–111)
Creatinine, Ser: 5.7 mg/dL — ABNORMAL HIGH (ref 0.61–1.24)
GFR calc Af Amer: 10 mL/min — ABNORMAL LOW (ref >60)
GFR calc non Af Amer: 9 mL/min — ABNORMAL LOW (ref >60)
Glucose, Bld: 88 mg/dL (ref 70–99)
Potassium: 5 mmol/L (ref 3.5–5.1)
Sodium: 143 mmol/L (ref 135–145)

## 2019-08-02 LAB — CBC
HCT: 30.4 % — ABNORMAL LOW (ref 39.0–52.0)
Hemoglobin: 9 g/dL — ABNORMAL LOW (ref 13.0–17.0)
MCH: 29.3 pg (ref 26.0–34.0)
MCHC: 29.6 g/dL — ABNORMAL LOW (ref 30.0–36.0)
MCV: 99 fL (ref 80.0–100.0)
Platelets: 171 10*3/uL (ref 150–400)
RBC: 3.07 MIL/uL — ABNORMAL LOW (ref 4.22–5.81)
RDW: 22 % — ABNORMAL HIGH (ref 11.5–15.5)
WBC: 9.2 10*3/uL (ref 4.0–10.5)
nRBC: 0.2 % (ref 0.0–0.2)

## 2019-08-02 LAB — MAGNESIUM: Magnesium: 2.7 mg/dL — ABNORMAL HIGH (ref 1.7–2.4)

## 2019-08-02 LAB — PHOSPHORUS: Phosphorus: 5.6 mg/dL — ABNORMAL HIGH (ref 2.5–4.6)

## 2019-08-02 NOTE — Consult Note (Addendum)
Consultation Note Date: 08/02/2019   Patient Name: Bobby Vega  DOB: 10-18-1934  MRN: [id number]  Age / Sex: 85 y.o., male  PCP: Lin Landsman, MD Referring Physician: Cristal Ford, DO  Reason for Consultation: Establishing goals of care  HPI/Patient Profile:  Per H&P --> Bobby Vega a 85 y.o.malewith medical history significant ofHTN,CKD 4,HFrEF,Stroke, BPH,PVD with aorta-femoral bypasspresentingwith a chief complaint of shortness of breath, found to have CHF exacerbation. The patient has difficulty communicating at the time of my exam given he is on BiPAP, history obtained with the aid from family (daughter Bobby Vega) as well as chart review in addition to interview of patient. Explained since his recent discharge he has been relatively well, though began feeling like he was experiencing shortness of breath 1 to 2 weeks ago began to feel volume overloaded around this time. Of note, patiently recently admitted 119/21 with rectal bleedingsecondary to diverticular bleed, though denies bleeding in the interim.During his hospitalization he had an AKI on CKD 4 and nephrology was consulted, after discussion they felt like patient was not a candidate for dialysis and similar discussions were held as outpatient on follow-up on the interim. During his hospitalization also patient had a TTE performed which noted an LVEF of 40 to 15%, grade 2 diastolic dysfunction with some regional wall motion abnormalities. He does not take any home diuretics.  Clinical Assessment and Goals of Care: I have reviewed medical records including EPIC notes, labs and imaging, received report from bedside RN, assessed the patient. Patient is somewhat confused upon assessment asking me where he was. When told he was in the hospital he kept asking why he is here and requesting to go home. Reoriented patient to where he was and  why.   I met with Bobby Vega at bedside and called his daughter, Bobby Vega via speaker phone to further discuss diagnosis prognosis, Bobby Vega, EOL wishes, disposition and options.   I introduced Palliative Medicine as specialized medical care for people living with serious illness. It focuses on providing relief from the symptoms and stress of a serious illness. The goal is to improve quality of life for both the patient and the family.  Asked Bobby Vega to give me a better impression of why patient came into the hospital. She shared that he had been having a hard time breathing at home due to fluid accumulation around his heart and lungs. Se stated that he is in the hospital to "get better." I asked if she understood that his diagnosis is progressive and due to his poor kidney function he very likely has limited time left on this earth. She shared with me that she felt he could improve but understands his illness.   We discussed how Mr. Ramdass is from [facility]. He has three children. He use to work as a Psychiatrist. Presently he is cared for by Bobby Vega who helps with bADLs. 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   Discussed the  concept and values associated with hospice. Strongly recommended consideration of hospice given the patients multiple comorbid conditions. Bobby Vega said "I don't want him to go there." We reviewed that hospice can truly take place anywhere inclusive of her home where the patient would most desire to be. She felt like that was reassuring and is interested in speaking with hospice agencies to better understand the services they can offer. We reviewed that home hospice does not offer 24/7 caregiver which she said she understood.   Went on to discuss patients high oxygen requirements. It will likely be challenging to find a hospice agency that accommodate these needs though the plan is to reach out to case management  to better gauge this.  Patient himself and his daughter would prefer for him to go home in his final days. Endorsed that we would try to better identify if hospice could accept him in his home environment with present O2 needs. Meanwhile nursing will try to wean O2 as able.  Discussed with patient the importance of continued conversation with family and their  medical providers regarding overall plan of care and treatment options, ensuring decisions are within the context of the patients values and GOCs.  Decision Maker: Bobby Vega (Daughter) [phone]  SUMMARY OF RECOMMENDATIONS   DNR/DNI  TOC --> Hospice referral, given patients O2 requirements would need to see who could accept the patient with such high O2 needs  Goal of patient is to get home and not return to the hospital  Code Status/Advance Care Planning:  DNR  Symptom Management:  Per primary  Palliative Prophylaxis:   Aspiration, Bowel Regimen, Delirium Protocol, Eye Care, Frequent Pain Assessment, Oral Care, Palliative Wound Care and Turn Reposition  Additional Recommendations (Limitations, Scope, Preferences):  Minimize Medications  Psycho-social/Spiritual:   Desire for further Chaplaincy support: Yes  Additional Recommendations: Caregiving  Support/Resources and Education on Hospice  Prognosis:   < 4 weeks  Discharge Planning: To Be Determined     Primary Diagnoses: Acute systolic congestive heart failure Acute hypoxemic respiratory failure Chronic kidney disease, stage V  I have reviewed the medical record, interviewed the patient and family, and examined the patient. The following aspects are pertinent.  Past Medical History:  Diagnosis Date  . Arthritis    hip  . Borderline diabetes   . BPH (benign prostatic hyperplasia)    takes Avodart daily  . Bronchitis    yrs ago  . Cervical spondylosis with myelopathy   . Cervical spondylosis without myelopathy   . Chronic back pain   . CKD  (chronic kidney disease), stage III   . Constipation    Metamucil prn  . Diverticulosis   . ED (erectile dysfunction)    takes Cialis as needed as well as Viagra  . Embolism - blood clot    in stomach  . GERD (gastroesophageal reflux disease)    takes Nexium daily  . Hyperlipidemia    takes Zocor nightly  . Hypertension    takes Losartan daily  . Lumbar spondylosis with myelopathy   . Nocturia   . Pain in joint, hand   . Peripheral vascular disease ([place])   . Stroke ([place])   . Urinary urgency    Social History   Socioeconomic History  . Marital status: Widowed    Spouse name: Not on file  . Number of children: Not on file  . Years of education: Not on file  . Highest education level: Not on file  Occupational History  . Not  on file  Tobacco Use  . Smoking status: Current Some Day Smoker    Years: 0.50    Types: Cigars    Last attempt to quit: 04/29/1992    Years since quitting: 27.2  . Smokeless tobacco: Never Used  Substance and Sexual Activity  . Alcohol use: No  . Drug use: No    Types: Marijuana    Comment: Hx  . Sexual activity: Not on file  Other Topics Concern  . Not on file  Social History Narrative  . Not on file   Social Determinants of Health   Financial Resource Strain:   . Difficulty of Paying Living Expenses: Not on file  Food Insecurity:   . Worried About Charity fundraiser in the Last Year: Not on file  . Ran Out of Food in the Last Year: Not on file  Transportation Needs:   . Lack of Transportation (Medical): Not on file  . Lack of Transportation (Non-Medical): Not on file  Physical Activity:   . Days of Exercise per Week: Not on file  . Minutes of Exercise per Session: Not on file  Stress:   . Feeling of Stress : Not on file  Social Connections:   . Frequency of Communication with Friends and Family: Not on file  . Frequency of Social Gatherings with Friends and Family: Not on file  . Attends Religious Services: Not on file  . Active  Member of Clubs or Organizations: Not on file  . Attends Archivist Meetings: Not on file  . Marital Status: Not on file   Family History  Problem Relation Age of Onset  . Heart disease Mother        Before age 34  . Heart disease Brother        NOT  before age 78  . Heart attack Brother   . Anesthesia problems Neg Hx   . Hypotension Neg Hx   . Malignant hyperthermia Neg Hx   . Pseudochol deficiency Neg Hx    Scheduled Meds: . amLODipine  10 mg Oral Daily  . aspirin EC  81 mg Oral Daily  . atorvastatin  20 mg Oral Daily  . clopidogrel  75 mg Oral Q breakfast  . famotidine  20 mg Oral Daily  . heparin  5,000 Units Subcutaneous Q8H  . hydrALAZINE  25 mg Oral BID  . ipratropium-albuterol  3 mL Nebulization Q6H  . sodium chloride flush  3 mL Intravenous Q12H  . sodium zirconium cyclosilicate  10 g Oral Daily   Continuous Infusions: . sodium chloride    . furosemide 120 mg (08/02/19 1434)   PRN Meds:.sodium chloride, acetaminophen, albuterol, ondansetron (ZOFRAN) IV, sodium chloride flush Medications Prior to Admission:  Prior to Admission medications   Medication Sig Start Date End Date Taking? Authorizing Provider  albuterol (VENTOLIN HFA) 108 (90 Base) MCG/ACT inhaler Inhale 1-2 puffs into the lungs every 4 (four) hours as needed for shortness of breath or wheezing. 05/29/19  Yes [provider]  amLODipine (NORVASC) 10 MG tablet Take 10 mg by mouth daily. 04/25/19  Yes [provider]  aspirin EC 81 MG tablet Take 81 mg by mouth daily.   Yes [provider]  atorvastatin (LIPITOR) 20 MG tablet Take 20 mg by mouth daily.   Yes [provider]  clopidogrel (PLAVIX) 75 MG tablet Take 1 tablet (75 mg total) by mouth daily with breakfast. 05/18/13  Yes Rai, Ripudeep K, MD  famotidine (PEPCID) 20  MG tablet Take 20 mg by mouth daily.   Yes [provider]  hydrALAZINE (APRESOLINE) 25 MG tablet Take 1 tablet (25 mg total) by mouth  2 (two) times daily. 05/18/13  Yes Rai, Ripudeep Raliegh Ip, MD  pregabalin (LYRICA) 75 MG capsule Take 75 mg by mouth daily after breakfast.  03/21/19  Yes [provider]  Vitamin D, Ergocalciferol, (DRISDOL) 50000 UNITS CAPS capsule Take 50,000 Units by mouth every Sunday.    Yes [provider]   No Known Allergies Review of Systems  Constitutional: Positive for activity change, appetite change and fatigue.  HENT: Positive for congestion.   Respiratory: Positive for shortness of breath.   Genitourinary: Positive for urgency.   Physical Exam Vitals and nursing note reviewed.  HENT:     Head: Normocephalic.     Mouth/Throat:     Mouth: Mucous membranes are moist.  Eyes:     Pupils: Pupils are equal, round, and reactive to light.  Cardiovascular:     Rate and Rhythm: Normal rate.  Pulmonary:     Effort: Tachypnea present.  Abdominal:     Palpations: Abdomen is soft.  Musculoskeletal:        General: Normal range of motion.     Cervical back: Normal range of motion.  Skin:    General: Skin is warm and dry.     Capillary Refill: Capillary refill takes less than 2 seconds.  Neurological:     Mental Status: He is alert. He is disoriented.    Vital Signs: BP (!) 151/75 (BP Location: Right Arm)   Pulse 78   Temp 97.9 F (36.6 C) (Oral)   Resp 17   Wt 71 kg   SpO2 92%   BMI 25.26 kg/m  Pain Scale: 0-10   Pain Score: 0-No pain  SpO2: SpO2: 92 % O2 Device:SpO2: 92 % O2 Flow Rate: .O2 Flow Rate (L/min): 11 L/min  IO: Intake/output summary:   Intake/Output Summary (Last 24 hours) at 08/02/2019 1815 Last data filed at 08/02/2019 1811 Gross per 24 hour  Intake 730 ml  Output 2152 ml  Net -1422 ml   LBM: Last BM Date: 08/01/19 Baseline Weight: Weight: 71.7 kg Most recent weight: Weight: 71 kg     Palliative Assessment/Data:30%   Time In: 1700 Time Out: 1810 Time Total: 70 Greater than 50%  of this time was spent counseling and coordinating care related to  the above assessment and plan.  Signed by: Rosezella Rumpf, NP   Please contact Palliative Medicine Team phone at [phone] for questions and concerns.  For individual provider: See Shea Evans

## 2019-08-02 NOTE — Progress Notes (Signed)
PROGRESS NOTE    Bobby Vega  WEX:[id number] DOB: 1934/12/09 DOA: 07/31/2019 PCP: Lin Landsman, MD   Brief Narrative:  HPI On 07/31/2019 by Dr. Clinton Sawyer Bobby Vega is a 85 y.o. male with medical history significant of HTN, CKD 4, HFrEF, Stroke, BPH,PVD with aorta-femoral bypass presenting with a chief complaint of shortness of breath, found to have CHF exacerbation.  The patient has difficulty communicating at the time of my exam given he is on BiPAP, history obtained with the aid from family (daughter [name]) as well as chart review in addition to interview of patient.  Explained since his recent discharge he has been relatively well, though began feeling like he was experiencing shortness of breath 1 to 2 weeks ago began to feel volume overloaded around this time. Of note, patiently recently admitted 119/21 with rectal bleeding secondary to diverticular bleed, though denies bleeding in the interim.  During his hospitalization he had an AKI on CKD 4 and nephrology was consulted, after discussion they felt like patient was not a candidate for dialysis and similar discussions were held as outpatient on follow-up on the interim.  During his hospitalization also patient had a TTE performed which noted an LVEF of 40 to 93%, grade 2 diastolic dysfunction with some regional wall motion abnormalities.  He does not take any home diuretics. Denies chest pain, abdominal pain, diarrhea, dysuria, hematuria, melena, medic easier, hematosis, nausea, vomiting.  Interim history Admitted for CHF exacerbation with AKI on CKD. Nephro and cardiology consulted. Palliative care also consulted. Assessment & Plan   Acute systolic congestive heart failure -Patient reports progressive dyspnea in recent weeks with volume overload and orthopnea -Echocardiogram 07/16/2019 showed an EF of 40 to 81%, grade 2 diastolic dysfunction, no significant valvular abnormality. -Patient was noted to be hypoxic, tachypneic with  increased work of breathing on admission along with chest x-ray findings worsening pulmonary edema, bilateral pleural effusions -BNP 3158 -Cardiology consulted and appreciated -Continue IV Lasix -Monitor intake and output, daily weights  Acute hypoxemic respiratory failure -Secondary to the above -Patient was noted to have an SPO2 of 86% on room air -He initially was placed on nasal cannula however required BiPAP overnight -Will attempt to wean as possible -COVID-19 negative, influenza PCR unremarkable  Chronic kidney disease, stage V -Creatinine upon admission 5.29- appears to be at baseline since the start of the year. Labs available from 2017 showing creatinine 2.5. -Nephrology consulted and appreciated- discussed with Dr. Carolin Sicks continue diuresis, not a HD candidate, recommended palliative care -Patient does not wish for hemodialysis -Continue to monitor intake and output  Hyperkalemia -Continue diuresis -resolved -Likely secondary to kidney disease -no EKG changes -continue lokelma -continue to monitor   Hyperphosphatemia -Likely secondary to kidney disease  Peripheral vascular disease -Continue aspirin, Plavix, statin  Essential hypertension -Continue amlodipine, hydralazine  Goals of care discussion -Patient currently DNR -Admitting physician had discussion with patient's daughter, [name].  Family understands that patient's renal function is worsening along with his heart failure.  Currently he is a DNR.  Patient does not wish to proceed with dialysis or aggressive interventions.  He is okay with BiPAP and IV medications. -Palliative care consulted and appreciated  DVT Prophylaxis  heparin  Code Status: DNR  Family Communication: None at bedside  Disposition Plan: Admitted. Patient admitted from home for CHF exacerbation with CKD5. Cardiology, nephrology, palliative care consulted. Dispo TBD.  Consultants Cardiology Nephrology Palliative  care  Procedures  None  Antibiotics   Anti-infectives (From admission, onward)  None      Subjective:   Bobby Vega seen and examined today.  Recently had BiPAP removed so that he could eat breakfast.  Feels his breathing is fine.  Denies chest pain, abdominal pain, nausea or vomiting, diarrhea or constipation.  Objective:   Vitals:   08/02/19 0446 08/02/19 0447 08/02/19 0800 08/02/19 0931  BP:   (!) 135/104   Pulse: 82  88   Resp: 18  19   Temp:   (!) 97.4 F (36.3 C)   TempSrc:   Oral   SpO2: 98%  93% 98%  Weight:  71 kg      Intake/Output Summary (Last 24 hours) at 08/02/2019 1135 Last data filed at 08/02/2019 1056 Gross per 24 hour  Intake 1074.91 ml  Output 2201 ml  Net -1126.09 ml   Filed Weights   08/01/19 0101 08/02/19 0447  Weight: 71.7 kg 71 kg   Exam  General: Well developed, chronically ill appearing, NAD  HEENT: NCAT, mucous membranes moist.   Cardiovascular: S1 S2 auscultated, RRR  Respiratory: diminished breath sounds  Abdomen: Soft, nontender, nondistended, + bowel sounds  Extremities: warm dry without cyanosis clubbing or edema  Neuro: AAOx3, nonfocal  Data Reviewed: I have personally reviewed following labs and imaging studies  CBC: Recent Labs  Lab 07/31/19 1632 07/31/19 1815 07/31/19 2215 08/02/19 0423  WBC 10.4  --  9.1 9.2  HGB 9.2* 9.5* 8.5* 9.0*  HCT 31.7* 28.0* 29.6* 30.4*  MCV 101.0*  --  101.7* 99.0  PLT 171  --  146* 381   Basic Metabolic Panel: Recent Labs  Lab 07/31/19 1632 07/31/19 1752 07/31/19 1815 07/31/19 2215 08/01/19 0012 08/02/19 0423  NA 145  --  143  --  146* 143  K 5.5*  --  6.1*  --  6.1* 5.0  CL 111  --   --   --  112* 109  CO2 20*  --   --   --  20* 23  GLUCOSE 125*  --   --   --  118* 88  BUN 39*  --   --   --  39* 43*  CREATININE 5.21*  --   --  5.29* 5.18* 5.70*  CALCIUM 9.2  --   --   --  9.1 8.9  MG  --  2.8*  --   --   --  2.7*  PHOS  --  5.4*  --   --   --  5.6*    GFR: Estimated Creatinine Clearance: 9 mL/min (A) (by C-G formula based on SCr of 5.7 mg/dL (H)). Liver Function Tests: Recent Labs  Lab 07/31/19 1632  AST 17  ALT 16  ALKPHOS 111  BILITOT 0.4  PROT 6.7  ALBUMIN 3.6   No results for input(s): LIPASE, AMYLASE in the last 168 hours. Recent Labs  Lab 07/31/19 1948  AMMONIA 22   Coagulation Profile: No results for input(s): INR, PROTIME in the last 168 hours. Cardiac Enzymes: No results for input(s): CKTOTAL, CKMB, CKMBINDEX, TROPONINI in the last 168 hours. BNP (last 3 results) No results for input(s): PROBNP in the last 8760 hours. HbA1C: No results for input(s): HGBA1C in the last 72 hours. CBG: No results for input(s): GLUCAP in the last 168 hours. Lipid Profile: No results for input(s): CHOL, HDL, LDLCALC, TRIG, CHOLHDL, LDLDIRECT in the last 72 hours. Thyroid Function Tests: Recent Labs    07/31/19 2215  TSH 2.233   Anemia Panel: No results for input(s):  VITAMINB12, FOLATE, FERRITIN, TIBC, IRON, RETICCTPCT in the last 72 hours. Urine analysis:    Component Value Date/Time   COLORURINE YELLOW 05/17/2013 0530   APPEARANCEUR CLOUDY (A) 05/17/2013 0530   LABSPEC 1.012 05/17/2013 0530   PHURINE 5.0 05/17/2013 0530   GLUCOSEU NEGATIVE 05/17/2013 0530   HGBUR NEGATIVE 05/17/2013 0530   BILIRUBINUR NEGATIVE 05/17/2013 0530   KETONESUR NEGATIVE 05/17/2013 0530   PROTEINUR 30 (A) 05/17/2013 0530   UROBILINOGEN 0.2 05/17/2013 0530   NITRITE NEGATIVE 05/17/2013 0530   LEUKOCYTESUR MODERATE (A) 05/17/2013 0530   Sepsis Labs: @LABRCNTIP (procalcitonin:4,lacticidven:4)  ) Recent Results (from the past 240 hour(s))  Respiratory Panel by RT PCR (Flu A&B, Covid) - Nasopharyngeal Swab     Status: None   Collection Time: 07/31/19  6:16 PM   Specimen: Nasopharyngeal Swab  Result Value Ref Range Status   SARS Coronavirus 2 by RT PCR NEGATIVE NEGATIVE Final    Comment: (NOTE) SARS-CoV-2 target nucleic acids are NOT  DETECTED. The SARS-CoV-2 RNA is generally detectable in upper respiratoy specimens during the acute phase of infection. The lowest concentration of SARS-CoV-2 viral copies this assay can detect is 131 copies/mL. A negative result does not preclude SARS-Cov-2 infection and should not be used as the sole basis for treatment or other patient management decisions. A negative result may occur with  improper specimen collection/handling, submission of specimen other than nasopharyngeal swab, presence of viral mutation(s) within the areas targeted by this assay, and inadequate number of viral copies (<131 copies/mL). A negative result must be combined with clinical observations, patient history, and epidemiological information. The expected result is Negative. Fact Sheet for Patients:  PinkCheek.be Fact Sheet for Healthcare Providers:  GravelBags.it This test is not yet ap proved or cleared by the [place] FDA and  has been authorized for detection and/or diagnosis of SARS-CoV-2 by FDA under an Emergency Use Authorization (EUA). This EUA will remain  in effect (meaning this test can be used) for the duration of the COVID-19 declaration under Section 564(b)(1) of the Act, 21 U.S.C. section 360bbb-3(b)(1), unless the authorization is terminated or revoked sooner.    Influenza A by PCR NEGATIVE NEGATIVE Final   Influenza B by PCR NEGATIVE NEGATIVE Final    Comment: (NOTE) The Xpert Xpress SARS-CoV-2/FLU/RSV assay is intended as an aid in  the diagnosis of influenza from Nasopharyngeal swab specimens and  should not be used as a sole basis for treatment. Nasal washings and  aspirates are unacceptable for Xpert Xpress SARS-CoV-2/FLU/RSV  testing. Fact Sheet for Patients: PinkCheek.be Fact Sheet for Healthcare Providers: GravelBags.it This test is not yet approved or cleared  by the [place] FDA and  has been authorized for detection and/or diagnosis of SARS-CoV-2 by  FDA under an Emergency Use Authorization (EUA). This EUA will remain  in effect (meaning this test can be used) for the duration of the  Covid-19 declaration under Section 564(b)(1) of the Act, 21  U.S.C. section 360bbb-3(b)(1), unless the authorization is  terminated or revoked. Performed at [facility], [place] [address] [place], [place] [address]       Radiology Studies: DG Chest Port 1 View  Result Date: 07/31/2019 CLINICAL DATA:  Shortness of breath EXAM: PORTABLE CHEST 1 VIEW COMPARISON:  July 30, 2019 FINDINGS: The heart size and mediastinal contours are within normal limits. There is mild hazy perihilar fluffy airspace opacity seen, slightly worsened from the prior exam. Small bilateral pleural effusions are seen. IMPRESSION: Slight interval worsening in pulmonary  edema and small bilateral pleural effusions. The Electronically Signed   By: Prudencio Pair M.D.   On: 07/31/2019 18:11     Scheduled Meds: . amLODipine  10 mg Oral Daily  . aspirin EC  81 mg Oral Daily  . atorvastatin  20 mg Oral Daily  . clopidogrel  75 mg Oral Q breakfast  . famotidine  20 mg Oral Daily  . heparin  5,000 Units Subcutaneous Q8H  . hydrALAZINE  25 mg Oral BID  . ipratropium-albuterol  3 mL Nebulization Q6H  . sodium chloride flush  3 mL Intravenous Q12H  . sodium zirconium cyclosilicate  10 g Oral Daily   Continuous Infusions: . sodium chloride    . furosemide 120 mg (08/02/19 0522)     LOS: 2 days   Time Spent in minutes   45 minutes  Keldric Poyer D.O. on 08/02/2019 at 11:35 AM  Between 7am to 7pm - Please see pager noted on [email or web address]  After 7pm go to [email or web address]  And look for the night coverage person covering for me after hours  [facility] Office  [phone]

## 2019-08-02 NOTE — Progress Notes (Signed)
Cardiac teli order expired. Paged MD Mikhail to renew it, per MD d/c teli.

## 2019-08-03 DIAGNOSIS — Z515 Encounter for palliative care: Secondary | ICD-10-CM

## 2019-08-03 LAB — BASIC METABOLIC PANEL
Anion gap: 14 (ref 5–15)
BUN: 44 mg/dL — ABNORMAL HIGH (ref 8–23)
CO2: 23 mmol/L (ref 22–32)
Calcium: 8.7 mg/dL — ABNORMAL LOW (ref 8.9–10.3)
Chloride: 105 mmol/L (ref 98–111)
Creatinine, Ser: 6.14 mg/dL — ABNORMAL HIGH (ref 0.61–1.24)
GFR calc Af Amer: 9 mL/min — ABNORMAL LOW (ref >60)
GFR calc non Af Amer: 8 mL/min — ABNORMAL LOW (ref >60)
Glucose, Bld: 85 mg/dL (ref 70–99)
Potassium: 4.6 mmol/L (ref 3.5–5.1)
Sodium: 142 mmol/L (ref 135–145)

## 2019-08-03 LAB — CBC
HCT: 29 % — ABNORMAL LOW (ref 39.0–52.0)
Hemoglobin: 9 g/dL — ABNORMAL LOW (ref 13.0–17.0)
MCH: 29.2 pg (ref 26.0–34.0)
MCHC: 31 g/dL (ref 30.0–36.0)
MCV: 94.2 fL (ref 80.0–100.0)
Platelets: 169 10*3/uL (ref 150–400)
RBC: 3.08 MIL/uL — ABNORMAL LOW (ref 4.22–5.81)
RDW: 21.5 % — ABNORMAL HIGH (ref 11.5–15.5)
WBC: 9.7 10*3/uL (ref 4.0–10.5)
nRBC: 0 % (ref 0.0–0.2)

## 2019-08-03 LAB — MAGNESIUM: Magnesium: 2.3 mg/dL (ref 1.7–2.4)

## 2019-08-03 MED ORDER — LORAZEPAM 2 MG/ML IJ SOLN: 0.5000 mg | INTRAMUSCULAR | Status: DC | PRN

## 2019-08-03 MED ORDER — ACETAMINOPHEN 650 MG RE SUPP: 325.0000 mg | Freq: Four times a day (QID) | RECTAL | Status: DC | PRN

## 2019-08-03 MED ORDER — GLYCOPYRROLATE 0.2 MG/ML IJ SOLN
0.4000 mg | INTRAMUSCULAR | Status: DC | PRN
  Filled 2019-08-03: qty 2

## 2019-08-03 MED ORDER — HYDROMORPHONE HCL 1 MG/ML IJ SOLN
0.5000 mg | INTRAMUSCULAR | Status: DC | PRN
  Administered 2019-08-03: 0.5 mg via INTRAVENOUS
  Filled 2019-08-03: qty 0.5

## 2019-08-03 MED ORDER — BIOTENE DRY MOUTH MT LIQD: 15.0000 mL | OROMUCOSAL | Status: DC | PRN

## 2019-08-03 MED ORDER — HYPROMELLOSE (GONIOSCOPIC) 2.5 % OP SOLN
1.0000 [drp] | Freq: Four times a day (QID) | OPHTHALMIC | Status: DC | PRN
  Filled 2019-08-03: qty 15

## 2019-08-03 MED ORDER — BISACODYL 10 MG RE SUPP: 10.0000 mg | Freq: Every day | RECTAL | Status: DC | PRN

## 2019-08-03 NOTE — TOC Initial Note (Signed)
Transition of Care [facility]) - Initial/Assessment Note    Patient Details  Name: Bobby Vega MRN: [id number] Date of Birth: February 05, 1935  Transition of Care [facility]) CM/SW Contact:    Carles Collet, RN Phone Number: 08/03/2019, 12:46 PM  Clinical Narrative:            [name] w patient's daughter [name] in the room. She states that she would like for her father to come home w hospice services tomorrow. She will be staying with him and providing primary care. She will be able to assume care starting tomorrow. She will be point of contact for DC planning.  We discussed DME needs. Patient will need DME O2 and 3/1. Per palliative, patient will not need HFNC.  Declined hospital bed. Currently has RW, Cane at home.  Confirmed address on facesheet. Patient will DC via PTAR. Forms printed and on front of chart.            Barriers to Discharge: No Barriers Identified   Patient Goals and CMS Choice Patient states their goals for this hospitalization and ongoing recovery are:: return home w hospice care CMS Medicare.gov Compare Post Acute Care list provided to:: Other (Comment Required) Choice offered to / list presented to : Adult Children  Expected Discharge Plan and Services                           DME Arranged: Oxygen, 3-N-1(By [facility])           HH Agency: Hospice and [place] Date [facility] Agency Contacted: 08/03/19 Time [place]: [id number] Representative spoke with at [place]: Riesa Pope  Prior Living Arrangements/Services                       Activities of Daily Living      Permission Sought/Granted                  Emotional Assessment              Admission diagnosis:  Acute exacerbation of CHF (congestive heart failure) ([place]) [I50.9] Chronic renal failure, stage 5 (HCC) [N18.5] Acute congestive heart failure, unspecified heart failure type [facility]) [I50.9] Patient Active Problem List   Diagnosis Date  Noted  . Terminal care   . Palliative care by specialist   . Goals of care, counseling/discussion   . Acute congestive heart failure ([place])   . Chronic renal failure, stage 5 (HCC)   . Acute exacerbation of CHF (congestive heart failure) ([place]) 07/31/2019  . Diverticulosis of colon with hemorrhage   . Acute GI bleeding 07/15/2019  . Lower GI bleed 07/15/2019  . AKI (acute kidney injury) ([place]) 05/22/2016  . CKD (chronic kidney disease) stage 3, GFR 30-59 ml/min 05/22/2016  . Acute encephalopathy 05/22/2016  . Acute on chronic diastolic heart failure ([place]) 05/22/2016  . Hypernatremia 05/22/2016  . Syncope 05/22/2016  . History of CVA with residual deficit   . Benign essential HTN   . Dementia without behavioral disturbance ([place])   . PVD (peripheral vascular disease) ([place])   . Diastolic dysfunction   . Urethral stricture   . Benign prostatic hyperplasia without lower urinary tract symptoms   . Leukocytosis   . Dysphagia   . Acute blood loss anemia   . Respiratory distress   . Acute on chronic renal failure ([place])   . Aspiration pneumonia of both lungs ([place]) 05/13/2016  . Acute hypoxemic respiratory  failure ([place]) 05/13/2016  . Pain in limb 06/10/2014  . Numbness-Bilateral Shoulder-Hand 06/10/2014  . Hand weakness 06/04/2013  . Left arm weakness 06/04/2013  . CVA (cerebral vascular accident) ([place]) 05/17/2013  . Other and unspecified hyperlipidemia 05/16/2013  . GERD (gastroesophageal reflux disease)   . Hypertension   . Right arm weakness 05/15/2013  . Open wound of abdominal wall, lateral, without mention of complication 70/34/0352  . PVD (peripheral vascular disease) with claudication ([place]) 08/02/2011  . Atherosclerosis of native arteries of the extremities with intermittent claudication 07/10/2011  . Occlusion and stenosis of carotid artery without mention of cerebral infarction 07/10/2011  . Peripheral vascular disease, unspecified ([place]) 06/26/2011   PCP:  Lin Landsman,  MD Pharmacy:   [facility] [place], [place] - [address] [place] [address] Phone: [phone] Fax: [phone]     Social Determinants of Health (SDOH) Interventions    Readmission Risk Interventions No flowsheet data found.

## 2019-08-03 NOTE — Progress Notes (Signed)
Manufacturing engineer [facility]) Hospital Liaison Note @1 :20 PM   Notified by Carles Collet, CMRN, of family request for [facility] services at home after discharge. Chart and pt information under review by [facility] physician. ACC eligibility pending at this time.   Writer spoke with daughter Ferd Hibbs to initiate education related to hospice philosophy, services and team approach to care. Daughter verbalized understanding of information provided. Per discussion, plan is for discharge to home by PTAR on 08/04/2019 Monday.   Please send signed and completed DNR form home with patient/family.  Patient will need prescriptions for discharge comfort medications.  DME needs have been discussed. Patient currently has the following equipment in the home: Gilford Rile and a cane. Family requested the following DME for delivery to the home Oxygen concentrator and 3-1 commode. [place] DME specialist has been notified and will contact [facility] to arrange delivery to the home. Home address has been verified and is correct in the chart. Ferd Hibbs is the family member to contact to arrange time of delivery.   [facility] aware of the above. Please fax completed discharge summary to [facility] [phone] when final. Please notify ACC when patient is ready to leave the unity at discharge. (Call [phone] between 8:30-5pm. Call [phone] after 5pm.) ACC information and contact numbers given to Ferd Hibbs during today's discussion. Above information shared with Carles Collet, CMRN.  Please call with hospice related questions.  Thank you for this referral.  Ellie Lunch [facility] Liaison (on [place]) [phone]

## 2019-08-03 NOTE — Progress Notes (Signed)
Palliative Medicine Inpatient Follow Up Note   HPI: Per H&P --> Bobby Vega a 85 y.o.malewith medical history significant ofHTN,CKD 4,HFrEF,Stroke, BPH,PVD with aorta-femoral bypasspresentingwith a chief complaint of shortness of breath, found to have CHF exacerbation. The patient has difficulty communicating at the time of my exam given he is on BiPAP, history obtained with the aid from family (daughter [name]) as well as chart review in addition to interview of patient. Explained since his recent discharge he has been relatively well, though began feeling like he was experiencing shortness of breath 1 to 2 weeks ago began to feel volume overloaded around this time. Of note, patiently recently admitted 119/21 with rectal bleedingsecondary to diverticular bleed, though denies bleeding in the interim.During his hospitalization he had an AKI on CKD 4 and nephrology was consulted, after discussion they felt like patient was not a candidate for dialysis and similar discussions were held as outpatient on follow-up on the interim. During his hospitalization also patient had a TTE performed which noted an LVEF of 40 to 03%, grade 2 diastolic dysfunction with some regional wall motion abnormalities. He does not take any home diuretics.  Today's Discussion (08/03/2019): Patient continues to have a tumultuous time breathing requiring that he go on and off bipap. Given that at this point bipap is being used a form of life support his daughter and I had a conversation about his prior wishes. [name] agreed that at this juncture we should focus on keeping Bobby Vega comfortable. Discussed what that typically looks like in the hospital inclusive of medications to help with dyspnea, anxiety, and secretions. In the meanwhile we are hopeful to get patient home with hospice. Comfort measures will be initiated this morning.   Questions and concerns addressed  Vital Signs Vitals:   08/03/19 0847  08/03/19 0850  BP:    Pulse: 73 88  Resp: 20 20  Temp:    SpO2: 93% 93%    Intake/Output Summary (Last 24 hours) at 08/03/2019 1020 Last data filed at 08/03/2019 0844 Gross per 24 hour  Intake 530 ml  Output 2401 ml  Net -1871 ml   Last Weight  Most recent update: 08/03/2019  6:40 AM   Weight  71.4 kg (157 lb 6.5 oz)          Physical Exam Vitals and nursing note reviewed.  HENT:     Head: Normocephalic.     Mouth/Throat:     Mouth: Mucous membranes are moist.  Eyes:     Pupils: Pupils are equal, round, and reactive to light.  Cardiovascular:     Rate and Rhythm: Normal rate.  Pulmonary:     Effort: Tachypnea present.  Abdominal:     Palpations: Abdomen is soft.  Musculoskeletal:        General: Normal range of motion.     Cervical back: Normal range of motion.  Skin:    General: Skin is warm and dry.     Capillary Refill: Capillary refill takes less than 2 seconds.  Neurological:     Mental Status: He is alert. He is disoriented.   SUMMARY OF RECOMMENDATIONS   Initiated on comfort measures  DNR/DNI  TOC --> Hospice referral patient and his daughter are hopeful that he can get home  Without high oxygen supplementation patient likely has days left to live  Code Status/Advance Care Planning:  DNR  Symptom Management:  Dyspnea:  - Dilaudid 0.5-1mg  IV Q1H PRN Pain:  - Tylenol 650mg  PO/PR Q6H PRN Fever:  -  Tylenol as above Agitation: Anxiety:  - Lorazepam 0.5-1mg  IV  Q1H PRN Nausea:  - Zofran 4mg  PO/IV Q6H PRN  Secretions:  - Glycopyrrolate 0.4mg  IV Q4H PRN Dry Eyes:  - Artificial Tears PRN Xerostomia:  - Biotene 71ml PRN  - BID oral care Urinary Retention:  - Bladder scan Qshift if > 316ml place a foley  Constipation:  - Bisacodyl 10mg  PR PRN QDay Spiritual:  - Chaplain consult  Time Spent: 35 Greater than 50% of the time was spent in counseling and coordination of  care ______________________________________________________________________________________ [place] Team Team Cell Phone: [phone] Please utilize secure chat with additional questions, if there is no response within 30 minutes please call the above phone number  Palliative Medicine Team providers are available by phone from 7am to 7pm daily and can be reached through the team cell phone.  Should this patient require assistance outside of these hours, please call the patient's attending physician.

## 2019-08-03 NOTE — Progress Notes (Addendum)
0630 - Pt stated it was hard to breathe. Small O2 desat to 91%. Placed on BIPAP to ease difficulty, pt tolerating well and O2 at 98%. Will continue to monitor.

## 2019-08-03 NOTE — Progress Notes (Signed)
PROGRESS NOTE    Bobby Vega  AST:[id number] DOB: [birth year] DOA: 07/31/2019 PCP: Lin Landsman, MD   Brief Narrative:  HPI On 07/31/2019 by Dr. Clinton Sawyer Bobby Vega is a 85 y.o. male with medical history significant of HTN, CKD 4, HFrEF, Stroke, BPH,PVD with aorta-femoral bypass presenting with a chief complaint of shortness of breath, found to have CHF exacerbation.  The patient has difficulty communicating at the time of my exam given he is on BiPAP, history obtained with the aid from family (daughter [name]) as well as chart review in addition to interview of patient.  Explained since his recent discharge he has been relatively well, though began feeling like he was experiencing shortness of breath 1 to 2 weeks ago began to feel volume overloaded around this time. Of note, patiently recently admitted 119/21 with rectal bleeding secondary to diverticular bleed, though denies bleeding in the interim.  During his hospitalization he had an AKI on CKD 4 and nephrology was consulted, after discussion they felt like patient was not a candidate for dialysis and similar discussions were held as outpatient on follow-up on the interim.  During his hospitalization also patient had a TTE performed which noted an LVEF of 40 to 98%, grade 2 diastolic dysfunction with some regional wall motion abnormalities.  He does not take any home diuretics. Denies chest pain, abdominal pain, diarrhea, dysuria, hematuria, melena, medic easier, hematosis, nausea, vomiting.  Interim history Admitted for CHF exacerbation with AKI on CKD. Nephro and cardiology consulted. Palliative care also consulted, patient transitioned to comfort care today as he continues to need intermittent BiPAP and HFNC. Assessment & Plan   Acute systolic congestive heart failure -Patient reports progressive dyspnea in recent weeks with volume overload and orthopnea -Echocardiogram 07/16/2019 showed an EF of 40 to 92%, grade 2 diastolic  dysfunction, no significant valvular abnormality. -Patient was noted to be hypoxic, tachypneic with increased work of breathing on admission along with chest x-ray findings worsening pulmonary edema, bilateral pleural effusions -BNP 3158 -Cardiology consulted and appreciated -Continue IV Lasix -Monitor intake and output, daily weights; UOP over past 24 hour 2401cc  Acute hypoxemic respiratory failure -Secondary to the above -Patient was noted to have an SPO2 of 86% on room air -He initially was placed on nasal cannula however required BiPAP overnight -Continues to need intermittent BiPAP with high flow nasal cannula -COVID-19 negative, influenza PCR unremarkable  Chronic kidney disease, stage V -Creatinine upon admission 5.29- appears to be at baseline since the start of the year. Labs available from 2017 showing creatinine 2.5. -Nephrology consulted and appreciated- discussed with Dr. Carolin Sicks continue diuresis, not a HD candidate, recommended palliative care -Patient does not wish for hemodialysis -Continue to monitor intake and output -Creatinine unfortunately up to 6.14  Hyperkalemia -Continue diuresis -resolved -Likely secondary to kidney disease -no EKG changes -continue lokelma -continue to monitor   Hyperphosphatemia -Likely secondary to kidney disease  Peripheral vascular disease -Continue aspirin, Plavix, statin  Essential hypertension -Continue amlodipine, hydralazine  Goals of care discussion -Patient currently DNR -Admitting physician had discussion with patient's daughter, [name].  Family understands that patient's renal function is worsening along with his heart failure.  Currently he is a DNR.  Patient does not wish to proceed with dialysis or aggressive interventions.  He is okay with BiPAP and IV medications. -Palliative care consulted and appreciated-given that patient does need intermittent BiPAP and high flow nasal cannula, patient's daughters opted to  transition him to comfort care with possible home with hospice -  However given patient's need for high oxygen, feel that he will likely have days to live without oxygen supplementation  DVT Prophylaxis  heparin  Code Status: DNR  Family Communication: None at bedside  Disposition Plan: Admitted. Patient admitted from home for CHF exacerbation with CKD5. Cardiology, nephrology, palliative care consulted- now comfort care. Dispo TBD.  Consultants Cardiology Nephrology Palliative care  Procedures  None  Antibiotics   Anti-infectives (From admission, onward)   None      Subjective:   Bobby Vega seen and examined today.  Feeling fine this morning and feels his breathing has improved.  (See of note patient is on 11 L of high flow nasal cannula.) denies current chest pain, abdominal pain, nausea or vomiting, diarrhea or constipation.  Objective:   Vitals:   08/03/19 0500 08/03/19 0700 08/03/19 0847 08/03/19 0850  BP: 116/87 116/70    Pulse: 79 88 73 88  Resp:   20 20  Temp:  99 F (37.2 C)    TempSrc:  Oral    SpO2: 91% 100% 93% 93%  Weight: 71.4 kg       Intake/Output Summary (Last 24 hours) at 08/03/2019 1151 Last data filed at 08/03/2019 1147 Gross per 24 hour  Intake 650 ml  Output 2300 ml  Net -1650 ml   Filed Weights   08/01/19 0101 08/02/19 0447 08/03/19 0500  Weight: 71.7 kg 71 kg 71.4 kg   Exam  General: Well developed, chronically ill appearing, NAD  HEENT: NCAT, mucous membranes moist.   Cardiovascular: S1 S2 auscultated, RRR  Respiratory: diminished breath sounds  Abdomen: Soft, nontender, nondistended, + bowel sounds  Extremities: warm dry without cyanosis clubbing or edema  Neuro: AAOx3, nonfocal  Data Reviewed: I have personally reviewed following labs and imaging studies  CBC: Recent Labs  Lab 07/31/19 1632 07/31/19 1815 07/31/19 2215 08/02/19 0423 08/03/19 0530  WBC 10.4  --  9.1 9.2 9.7  HGB 9.2* 9.5* 8.5* 9.0* 9.0*  HCT  31.7* 28.0* 29.6* 30.4* 29.0*  MCV 101.0*  --  101.7* 99.0 94.2  PLT 171  --  146* 171 803   Basic Metabolic Panel: Recent Labs  Lab 07/31/19 1632 07/31/19 1752 07/31/19 1815 07/31/19 2215 08/01/19 0012 08/02/19 0423 08/03/19 0530  NA 145  --  143  --  146* 143 142  K 5.5*  --  6.1*  --  6.1* 5.0 4.6  CL 111  --   --   --  112* 109 105  CO2 20*  --   --   --  20* 23 23  GLUCOSE 125*  --   --   --  118* 88 85  BUN 39*  --   --   --  39* 43* 44*  CREATININE 5.21*  --   --  5.29* 5.18* 5.70* 6.14*  CALCIUM 9.2  --   --   --  9.1 8.9 8.7*  MG  --  2.8*  --   --   --  2.7* 2.3  PHOS  --  5.4*  --   --   --  5.6*  --    GFR: Estimated Creatinine Clearance: 8.4 mL/min (A) (by C-G formula based on SCr of 6.14 mg/dL (H)). Liver Function Tests: Recent Labs  Lab 07/31/19 1632  AST 17  ALT 16  ALKPHOS 111  BILITOT 0.4  PROT 6.7  ALBUMIN 3.6   No results for input(s): LIPASE, AMYLASE in the last 168 hours. Recent Labs  Lab 07/31/19  1948  AMMONIA 22   Coagulation Profile: No results for input(s): INR, PROTIME in the last 168 hours. Cardiac Enzymes: No results for input(s): CKTOTAL, CKMB, CKMBINDEX, TROPONINI in the last 168 hours. BNP (last 3 results) No results for input(s): PROBNP in the last 8760 hours. HbA1C: No results for input(s): HGBA1C in the last 72 hours. CBG: No results for input(s): GLUCAP in the last 168 hours. Lipid Profile: No results for input(s): CHOL, HDL, LDLCALC, TRIG, CHOLHDL, LDLDIRECT in the last 72 hours. Thyroid Function Tests: Recent Labs    07/31/19 2215  TSH 2.233   Anemia Panel: No results for input(s): VITAMINB12, FOLATE, FERRITIN, TIBC, IRON, RETICCTPCT in the last 72 hours. Urine analysis:    Component Value Date/Time   COLORURINE YELLOW 05/17/2013 0530   APPEARANCEUR CLOUDY (A) 05/17/2013 0530   LABSPEC 1.012 05/17/2013 0530   PHURINE 5.0 05/17/2013 0530   GLUCOSEU NEGATIVE 05/17/2013 0530   HGBUR NEGATIVE 05/17/2013 0530    BILIRUBINUR NEGATIVE 05/17/2013 0530   KETONESUR NEGATIVE 05/17/2013 0530   PROTEINUR 30 (A) 05/17/2013 0530   UROBILINOGEN 0.2 05/17/2013 0530   NITRITE NEGATIVE 05/17/2013 0530   LEUKOCYTESUR MODERATE (A) 05/17/2013 0530   Sepsis Labs: @LABRCNTIP (procalcitonin:4,lacticidven:4)  ) Recent Results (from the past 240 hour(s))  Respiratory Panel by RT PCR (Flu A&B, Covid) - Nasopharyngeal Swab     Status: None   Collection Time: 07/31/19  6:16 PM   Specimen: Nasopharyngeal Swab  Result Value Ref Range Status   SARS Coronavirus 2 by RT PCR NEGATIVE NEGATIVE Final    Comment: (NOTE) SARS-CoV-2 target nucleic acids are NOT DETECTED. The SARS-CoV-2 RNA is generally detectable in upper respiratoy specimens during the acute phase of infection. The lowest concentration of SARS-CoV-2 viral copies this assay can detect is 131 copies/mL. A negative result does not preclude SARS-Cov-2 infection and should not be used as the sole basis for treatment or other patient management decisions. A negative result may occur with  improper specimen collection/handling, submission of specimen other than nasopharyngeal swab, presence of viral mutation(s) within the areas targeted by this assay, and inadequate number of viral copies (<131 copies/mL). A negative result must be combined with clinical observations, patient history, and epidemiological information. The expected result is Negative. Fact Sheet for Patients:  PinkCheek.be Fact Sheet for Healthcare Providers:  GravelBags.it This test is not yet ap proved or cleared by the [place] FDA and  has been authorized for detection and/or diagnosis of SARS-CoV-2 by FDA under an Emergency Use Authorization (EUA). This EUA will remain  in effect (meaning this test can be used) for the duration of the COVID-19 declaration under Section 564(b)(1) of the Act, 21 U.S.C. section 360bbb-3(b)(1),  unless the authorization is terminated or revoked sooner.    Influenza A by PCR NEGATIVE NEGATIVE Final   Influenza B by PCR NEGATIVE NEGATIVE Final    Comment: (NOTE) The Xpert Xpress SARS-CoV-2/FLU/RSV assay is intended as an aid in  the diagnosis of influenza from Nasopharyngeal swab specimens and  should not be used as a sole basis for treatment. Nasal washings and  aspirates are unacceptable for Xpert Xpress SARS-CoV-2/FLU/RSV  testing. Fact Sheet for Patients: PinkCheek.be Fact Sheet for Healthcare Providers: GravelBags.it This test is not yet approved or cleared by the [place] FDA and  has been authorized for detection and/or diagnosis of SARS-CoV-2 by  FDA under an Emergency Use Authorization (EUA). This EUA will remain  in effect (meaning this test can be used) for the duration  of the  Covid-19 declaration under Section 564(b)(1) of the Act, 21  U.S.C. section 360bbb-3(b)(1), unless the authorization is  terminated or revoked. Performed at [facility], [place] [address] [place], [place] [address]       Radiology Studies: No results found.   Scheduled Meds: . amLODipine  10 mg Oral Daily  . aspirin EC  81 mg Oral Daily  . clopidogrel  75 mg Oral Q breakfast  . ipratropium-albuterol  3 mL Nebulization Q6H  . sodium zirconium cyclosilicate  10 g Oral Daily   Continuous Infusions: . furosemide 120 mg (08/03/19 0543)     LOS: 3 days   Time Spent in minutes   45 minutes  Jaime Dome D.O. on 08/03/2019 at 11:51 AM  Between 7am to 7pm - Please see pager noted on [email or web address]  After 7pm go to [email or web address]  And look for the night coverage person covering for me after hours  [facility] Office  [phone]

## 2019-08-03 NOTE — Progress Notes (Addendum)
Patient off  Bi-PAP all day, on continuous supplemental oxygen. Spoke wit MD Ree Kida, keep patient on HFNC for now, still doing Lasix drip. Patient with no complaints of pain. Will continue to monitor.

## 2019-08-04 DIAGNOSIS — I5021 Acute systolic (congestive) heart failure: Secondary | ICD-10-CM

## 2019-08-04 DIAGNOSIS — Z7189 Other specified counseling: Secondary | ICD-10-CM

## 2019-08-04 DIAGNOSIS — Z515 Encounter for palliative care: Secondary | ICD-10-CM

## 2019-08-04 LAB — GLUCOSE, CAPILLARY: Glucose-Capillary: 81 mg/dL (ref 70–99)

## 2019-08-04 NOTE — Care Management Important Message (Signed)
Important Message  Patient Details  Name: Bobby Vega MRN: [id number] Date of Birth: December 13, 1934   Medicare Important Message Given:  Yes     Shelda Altes 08/04/2019, 3:39 PM

## 2019-08-04 NOTE — TOC Transition Note (Signed)
Transition of Care [facility]) - CM/SW Discharge Note   Patient Details  Name: Bobby Vega MRN: [id number] Date of Birth: [birth year]  [facility]) CM/SW Contact:  Zenon Mayo, RN Phone Number: 08/04/2019, 3:29 PM   Clinical Narrative:    Patient for dc to home with hospice today with [facility].  Oxygen has been delivered to patient's home, patient to take mask with him at discharge. NCM has scheduled ptar.     Final next level of care: Home w Hospice Care Barriers to Discharge: No Barriers Identified   Patient Goals and CMS Choice Patient states their goals for this hospitalization and ongoing recovery are:: home with hospice CMS Medicare.gov Compare Post Acute Care list provided to:: Other (Comment Required) Choice offered to / list presented to : Adult Children  Discharge Placement                       Discharge Plan and Services                DME Arranged: (Hospice arranged [facility])         [place] Arranged: RN [place] Agency: Lonia Chimera) Date [place]: 08/04/19 Time [place]: 1529 Representative spoke with at [place]: [place] ([place]) Interventions     Readmission Risk Interventions No flowsheet data found.

## 2019-08-04 NOTE — Discharge Summary (Signed)
Physician Discharge Summary  Bobby Vega YHC:[id number] DOB: [birth year] DOA: 07/31/2019  PCP: Bobby Landsman, MD  Admit date: 07/31/2019 Discharge date: 08/04/2019  Time spent: 45 minutes  Recommendations for Outpatient Follow-up:  Patient will be discharged to home with hospice.    Discharge Diagnoses:  Acute systolic congestive heart failure Acute hypoxemic respiratory failure Chronic kidney disease, stage V Hyperkalemia Hyperphosphatemia Peripheral vascular disease Essential hypertension Goals of care discussion  Discharge Condition: Stable  Diet recommendation: regular  Filed Weights   08/02/19 0447 08/03/19 0500 08/04/19 0342  Weight: 71 kg 71.4 kg 66.3 kg    History of present illness:  On 07/31/2019 by Bobby Vega a 85 y.o.malewith medical history significant ofHTN,CKD 4,HFrEF,Stroke, BPH,PVD with aorta-femoral bypasspresentingwith a chief complaint of shortness of breath, found to have CHF exacerbation. The patient has difficulty communicating at the time of my exam given he is on BiPAP, history obtained with the aid from family (daughter Bobby Vega) as well as chart review in addition to interview of patient. Explained since his recent discharge he has been relatively well, though began feeling like he was experiencing shortness of breath 1 to 2 weeks ago began to feel volume overloaded around this time. Of note, patiently recently admitted 119/21 with rectal bleedingsecondary to diverticular bleed, though denies bleeding in the interim.During his hospitalization he had an AKI on CKD 4 and nephrology was consulted, after discussion they felt like patient was not a candidate for dialysis and similar discussions were held as outpatient on follow-up on the interim. During his hospitalization also patient had a TTE performed which noted an LVEF of 40 to 51%, grade 2 diastolic dysfunction with some regional wall motion abnormalities. He does not  take any home diuretics. Denies chest pain, abdominal pain, diarrhea, dysuria, hematuria, melena, medic easier, hematosis, nausea, vomiting.  Hospital Course:  Acute systolic congestive heart failure -Patient reports progressive dyspnea in recent weeks with volume overload and orthopnea -Echocardiogram 07/16/2019 showed an EF of 40 to 76%, grade 2 diastolic dysfunction, no significant valvular abnormality. -Patient was noted to be hypoxic, tachypneic with increased work of breathing on admission along with chest x-ray findings worsening pulmonary edema, bilateral pleural effusions -BNP 3158 -Cardiology consulted and appreciated -Was placed IV Lasix -Monitor intake and output, daily weights; UOP over past 24 hour 1339cc  Acute hypoxemic respiratory failure -Secondary to the above -Patient was noted to have an SPO2 of 86% on room air -He initially was placed on nasal cannula however required BiPAP overnight -Continues to need intermittent BiPAP with high flow nasal cannula -COVID-19 negative, influenza PCR unremarkable  Chronic kidney disease, stage V -Creatinine upon admission 5.29- appears to be at baseline since the start of the year. Labs available from 2017 showing creatinine 2.5. -Nephrology consulted and appreciated- discussed with Bobby Vega continue diuresis, not a HD candidate, recommended palliative care -Patient does not wish for hemodialysis -Continue to monitor intake and output -Creatinine unfortunately up to 6.14  Hyperkalemia -Continue diuresis -resolved -Likely secondary to kidney disease -no EKG changes -continue lokelma -continue to monitor   Hyperphosphatemia -Likely secondary to kidney disease  Peripheral vascular disease -Continue aspirin, Plavix, statin  Essential hypertension -Continue amlodipine, hydralazine  Goals of care discussion -Patient currently DNR -Admitting physician had discussion with patient's daughter, Bobby Vega.  Family  understands that patient's renal function is worsening along with his heart failure.  Currently he is a DNR.  Patient does not wish to proceed with dialysis or aggressive interventions.  He is okay  with BiPAP and IV medications. -Palliative care consulted and appreciated-given that patient does need intermittent BiPAP and high flow nasal cannula, patient's daughters opted to transition him to comfort care with possible home with hospice -patient will be discharged with oxygen for comfort  Code status: DNR  Consultants Cardiology Nephrology Palliative care  Procedures  None  Discharge Exam: Vitals:   08/04/19 0342 08/04/19 1042  BP: 123/78 133/66  Pulse: 93 89  Resp: 19 16  Temp: 97.7 F (36.5 C) 98.3 F (36.8 C)  SpO2: 92% 96%     General: Well developed, chronically ill appearing, NAD  HEENT: NCAT, mucous membranes moist.  Cardiovascular: S1 S2 auscultated, RRR  Respiratory: Diminished  Abdomen: Soft, nontender, nondistended, + bowel sounds  Extremities: warm dry without cyanosis clubbing or edema   Discharge Instructions  Allergies as of 08/04/2019   No Known Allergies     Medication List    STOP taking these medications   atorvastatin 20 MG tablet Commonly known as: LIPITOR   hydrALAZINE 25 MG tablet Commonly known as: APRESOLINE   pregabalin 75 MG capsule Commonly known as: LYRICA   Vitamin D (Ergocalciferol) 1.25 MG (50000 UNIT) Caps capsule Commonly known as: DRISDOL     TAKE these medications   albuterol 108 (90 Base) MCG/ACT inhaler Commonly known as: VENTOLIN HFA Inhale 1-2 puffs into the lungs every 4 (four) hours as needed for shortness of breath or wheezing.   amLODipine 10 MG tablet Commonly known as: NORVASC Take 10 mg by mouth daily.   aspirin EC 81 MG tablet Take 81 mg by mouth daily.   clopidogrel 75 MG tablet Commonly known as: PLAVIX Take 1 tablet (75 mg total) by mouth daily with breakfast.   famotidine 20 MG  tablet Commonly known as: PEPCID Take 20 mg by mouth daily.      No Known Allergies    The results of significant diagnostics from this hospitalization (including imaging, microbiology, ancillary and laboratory) are listed below for reference.    Significant Diagnostic Studies: DG Chest 2 View  Result Date: 07/30/2019 CLINICAL DATA:  Per order- Dyspnea Dyspnea on exertion; crackles on left. Dyspnea, unspecified type EXAM: CHEST - 2 VIEW COMPARISON:  Radiograph 07/15/2019 FINDINGS: 1 normal cardiac silhouette. Bilateral small small to moderate pleural effusions. Upper lungs are clear. Degenerative osteophytosis of the spine. IMPRESSION: Bilateral pleural effusions.  No airspace disease. Electronically Signed   By: Suzy Bouchard M.D.   On: 07/30/2019 16:40   US RENAL  Result Date: 07/15/2019 CLINICAL DATA:  Acute kidney injury. EXAM: RENAL / URINARY TRACT ULTRASOUND COMPLETE COMPARISON:  March 22, 2017. FINDINGS: Right Kidney: Renal measurements: 10.7 x 5.3 x 4.2 cm = volume: 123 mL. Increased echogenicity of renal parenchyma is noted. Two simple cysts are noted, the largest measuring 1.8 cm. No mass or hydronephrosis visualized. Left Kidney: Renal measurements: 8.2 x 4.5 x 4.0 cm = volume: 76 mL. Increased echogenicity of renal parenchyma is noted. 1.2 cm simple cyst is noted. No mass or hydronephrosis visualized. Bladder: Appears normal for degree of bladder distention. Other: None. IMPRESSION: Increased echogenicity of renal parenchyma is noted bilaterally suggesting medical renal disease. Small bilateral renal cysts are noted. No hydronephrosis or renal obstruction is noted. Electronically Signed   By: Marijo Conception M.D.   On: 07/15/2019 16:23   DG Chest Port 1 View  Result Date: 07/31/2019 CLINICAL DATA:  Shortness of breath EXAM: PORTABLE CHEST 1 VIEW COMPARISON:  July 30, 2019 FINDINGS: The heart  size and mediastinal contours are within normal limits. There is mild hazy  perihilar fluffy airspace opacity seen, slightly worsened from the prior exam. Small bilateral pleural effusions are seen. IMPRESSION: Slight interval worsening in pulmonary edema and small bilateral pleural effusions. The Electronically Signed   By: Prudencio Pair M.D.   On: 07/31/2019 18:11   DG Chest Port 1 View  Result Date: 07/15/2019 CLINICAL DATA:  Recent syncopal episode EXAM: PORTABLE CHEST 1 VIEW COMPARISON:  05/19/2016 FINDINGS: Cardiac shadow is mildly enlarged but accentuated by the portable technique. Small right-sided pleural effusion is noted. There is likely underlying basilar atelectasis. No other focal infiltrate is seen. No bony abnormality is noted. IMPRESSION: Small right pleural effusion with underlying basilar atelectasis. Electronically Signed   By: Inez Catalina M.D.   On: 07/15/2019 10:54   ECHOCARDIOGRAM COMPLETE  Result Date: 07/16/2019   ECHOCARDIOGRAM REPORT   Patient Name:   JEYDAN BARNER Date of Exam: 07/16/2019 Medical Rec #:  [id number]      Height:       65.0 in Accession #:    [id number]     Weight:       156.5 lb Date of Birth:  [birth year]      BSA:          1.78 m Patient Age:    85 years       BP:           144/62 mmHg Patient Gender: M              HR:           82 bpm. Exam Location:  Inpatient Procedure: 2D Echo Indications:    Dyspnea 786.09 / R06.00  History:        Patient has prior history of Echocardiogram examinations, most                 recent 05/16/2016. CHF, Stroke; Risk Factors:Hypertension. AKI                 (acute kidney injury)                 CKD (chronic kidney disease).  Sonographer:    Vikki Ports Turrentine Referring Phys: [id number] [place]  1. Left ventricular ejection fraction, by visual estimation, is 40 to 45%. The left ventricle has mild to moderately decreased function. There is no left ventricular hypertrophy.  2. Basal inferolateral segment is abnormal.  3. Left ventricular diastolic parameters are consistent with Grade II  diastolic dysfunction (pseudonormalization).  4. The left ventricle demonstrates regional wall motion abnormalities.  5. Global right ventricle has normal systolic function.The right ventricular size is normal. No increase in right ventricular wall thickness.  6. Left atrial size was moderately dilated.  7. Right atrial size was mildly dilated.  8. Moderate pleural effusion in the left lateral region.  9. The mitral valve is normal in structure. Mild mitral valve regurgitation. No evidence of mitral stenosis. 10. The tricuspid valve is normal in structure. 11. The tricuspid valve is normal in structure. Tricuspid valve regurgitation is mild. 12. The aortic valve is normal in structure. Aortic valve regurgitation is not visualized. No evidence of aortic valve sclerosis or stenosis. 13. The pulmonic valve was normal in structure. Pulmonic valve regurgitation is not visualized. 14. Moderately elevated pulmonary artery systolic pressure. 15. The tricuspid regurgitant velocity is 2.84 m/s, and with an assumed right atrial pressure of 15 mmHg, the estimated right ventricular systolic pressure  is moderately elevated at 47.3 mmHg. 16. The inferior vena cava is dilated in size with <50% respiratory variability, suggesting right atrial pressure of 15 mmHg. FINDINGS  Left Ventricle: Left ventricular ejection fraction, by visual estimation, is 40 to 45%. The left ventricle has mild to moderately decreased function. The left ventricle demonstrates regional wall motion abnormalities. There is no left ventricular hypertrophy. Left ventricular diastolic parameters are consistent with Grade II diastolic dysfunction (pseudonormalization). Normal left atrial pressure.  LV Wall Scoring: The basal inferolateral segment is akinetic. Right Ventricle: The right ventricular size is normal. No increase in right ventricular wall thickness. Global RV systolic function is has normal systolic function. The tricuspid regurgitant velocity is 2.84  m/s, and with an assumed right atrial pressure  of 15 mmHg, the estimated right ventricular systolic pressure is moderately elevated at 47.3 mmHg. Left Atrium: Left atrial size was moderately dilated. Right Atrium: Right atrial size was mildly dilated Pericardium: There is no evidence of pericardial effusion. There is a moderate pleural effusion in the left lateral region. Mitral Valve: The mitral valve is normal in structure. Mild mitral valve regurgitation. No evidence of mitral valve stenosis by observation. Tricuspid Valve: The tricuspid valve is normal in structure. Tricuspid valve regurgitation is mild. Aortic Valve: The aortic valve is normal in structure. Aortic valve regurgitation is not visualized. The aortic valve is structurally normal, with no evidence of sclerosis or stenosis. Pulmonic Valve: The pulmonic valve was normal in structure. Pulmonic valve regurgitation is not visualized. Pulmonic regurgitation is not visualized. Aorta: The aortic root, ascending aorta and aortic arch are all structurally normal, with no evidence of dilitation or obstruction. Venous: The inferior vena cava is dilated in size with less than 50% respiratory variability, suggesting right atrial pressure of 15 mmHg. IAS/Shunts: No atrial level shunt detected by color flow Doppler. There is no evidence of a patent foramen ovale. No ventricular septal defect is seen or detected. There is no evidence of an atrial septal defect.  LEFT VENTRICLE PLAX 2D LVIDd:         4.00 cm  Diastology LVIDs:         3.30 cm  LV e' lateral:   5.33 cm/s LV PW:         1.00 cm  LV E/e' lateral: 19.5 LV IVS:        1.00 cm  LV e' medial:    3.53 cm/s LVOT diam:     2.10 cm  LV E/e' medial:  29.5 LV SV:         26 ml LV SV Index:   14.22 LVOT Area:     3.46 cm  RIGHT VENTRICLE RV S prime:     12.20 cm/s TAPSE (M-mode): 2.0 cm LEFT ATRIUM             Index       RIGHT ATRIUM           Index LA diam:        3.80 cm 2.13 cm/m  RA Area:     21.00 cm LA  Vol (A2C):   64.3 ml 36.07 ml/m RA Volume:   66.30 ml  37.20 ml/m LA Vol (A4C):   88.5 ml 49.65 ml/m LA Biplane Vol: 82.2 ml 46.12 ml/m  AORTIC VALVE LVOT Vmax:   60.10 cm/s LVOT Vmean:  41.400 cm/s LVOT VTI:    0.106 m  AORTA Ao Root diam: 2.80 cm MITRAL VALVE  TRICUSPID VALVE MV Area (PHT): 3.68 cm              TR Peak grad:   32.3 mmHg MV PHT:        59.74 msec            TR Vmax:        284.00 cm/s MV Decel Time: 206 msec MV E velocity: 104.00 cm/s 103 cm/s  SHUNTS MV A velocity: 60.00 cm/s  70.3 cm/s Systemic VTI:  0.11 m MV E/A ratio:  1.73        1.5       Systemic Diam: 2.10 cm  Candee Furbish MD Electronically signed by Candee Furbish MD Signature Date/Time: 07/16/2019/1:55:27 PM    Final     Microbiology: Recent Results (from the past 240 hour(s))  Respiratory Panel by RT PCR (Flu A&B, Covid) - Nasopharyngeal Swab     Status: None   Collection Time: 07/31/19  6:16 PM   Specimen: Nasopharyngeal Swab  Result Value Ref Range Status   SARS Coronavirus 2 by RT PCR NEGATIVE NEGATIVE Final    Comment: (NOTE) SARS-CoV-2 target nucleic acids are NOT DETECTED. The SARS-CoV-2 RNA is generally detectable in upper respiratoy specimens during the acute phase of infection. The lowest concentration of SARS-CoV-2 viral copies this assay can detect is 131 copies/mL. A negative result does not preclude SARS-Cov-2 infection and should not be used as the sole basis for treatment or other patient management decisions. A negative result may occur with  improper specimen collection/handling, submission of specimen other than nasopharyngeal swab, presence of viral mutation(s) within the areas targeted by this assay, and inadequate number of viral copies (<131 copies/mL). A negative result must be combined with clinical observations, patient history, and epidemiological information. The expected result is Negative. Fact Sheet for Patients:   PinkCheek.be Fact Sheet for Healthcare Providers:  GravelBags.it This test is not yet ap proved or cleared by the [place] FDA and  has been authorized for detection and/or diagnosis of SARS-CoV-2 by FDA under an Emergency Use Authorization (EUA). This EUA will remain  in effect (meaning this test can be used) for the duration of the COVID-19 declaration under Section 564(b)(1) of the Act, 21 U.S.C. section 360bbb-3(b)(1), unless the authorization is terminated or revoked sooner.    Influenza A by PCR NEGATIVE NEGATIVE Final   Influenza B by PCR NEGATIVE NEGATIVE Final    Comment: (NOTE) The Xpert Xpress SARS-CoV-2/FLU/RSV assay is intended as an aid in  the diagnosis of influenza from Nasopharyngeal swab specimens and  should not be used as a sole basis for treatment. Nasal washings and  aspirates are unacceptable for Xpert Xpress SARS-CoV-2/FLU/RSV  testing. Fact Sheet for Patients: PinkCheek.be Fact Sheet for Healthcare Providers: GravelBags.it This test is not yet approved or cleared by the [place] FDA and  has been authorized for detection and/or diagnosis of SARS-CoV-2 by  FDA under an Emergency Use Authorization (EUA). This EUA will remain  in effect (meaning this test can be used) for the duration of the  Covid-19 declaration under Section 564(b)(1) of the Act, 21  U.S.C. section 360bbb-3(b)(1), unless the authorization is  terminated or revoked. Performed at [facility], [place] [address] [place], [place] [address]      Labs: Basic Metabolic Panel: Recent Labs  Lab 07/31/19 1632 07/31/19 1752 07/31/19 1815 07/31/19 2215 08/01/19 0012 08/02/19 0423 08/03/19 0530  NA 145  --  143  --  146* 143 142  K 5.5*  --  6.1*  --  6.1* 5.0 4.6  CL 111  --   --   --  112* 109 105  CO2 20*  --   --   --  20* 23 23  GLUCOSE 125*  --   --   --   118* 88 85  BUN 39*  --   --   --  39* 43* 44*  CREATININE 5.21*  --   --  5.29* 5.18* 5.70* 6.14*  CALCIUM 9.2  --   --   --  9.1 8.9 8.7*  MG  --  2.8*  --   --   --  2.7* 2.3  PHOS  --  5.4*  --   --   --  5.6*  --    Liver Function Tests: Recent Labs  Lab 07/31/19 1632  AST 17  ALT 16  ALKPHOS 111  BILITOT 0.4  PROT 6.7  ALBUMIN 3.6   No results for input(s): LIPASE, AMYLASE in the last 168 hours. Recent Labs  Lab 07/31/19 1948  AMMONIA 22   CBC: Recent Labs  Lab 07/31/19 1632 07/31/19 1815 07/31/19 2215 08/02/19 0423 08/03/19 0530  WBC 10.4  --  9.1 9.2 9.7  HGB 9.2* 9.5* 8.5* 9.0* 9.0*  HCT 31.7* 28.0* 29.6* 30.4* 29.0*  MCV 101.0*  --  101.7* 99.0 94.2  PLT 171  --  146* 171 169   Cardiac Enzymes: No results for input(s): CKTOTAL, CKMB, CKMBINDEX, TROPONINI in the last 168 hours. BNP: BNP (last 3 results) Recent Labs    07/31/19 1752  BNP 3,158.1*    ProBNP (last 3 results) No results for input(s): PROBNP in the last 8760 hours.  CBG: Recent Labs  Lab 08/04/19 0634  GLUCAP 81       Signed:  Sasan Wilkie  [facility] 08/04/2019, 1:47 PM

## 2019-08-04 NOTE — TOC Progression Note (Addendum)
Transition of Care [facility]) - Progression Note    Patient Details  Name: Bobby Vega MRN: [id number] Date of Birth: 1934/09/23  [facility]) CM/SW Contact  Zenon Mayo, RN Phone Number: 08/04/2019, 9:49 AM  Clinical Narrative:    NCM received call from MD , stating would like to try to get patient home with hospice today,  NCM contacted [facility] with [facility], she states she will have Harmon Pier to call NCM.  Patient will need oxygen concentrator and 3 n 1.  Per Eval with [facility] the DME has not been delivered yet by [employer] , once DME has been delivered she will let this NCM know.     Barriers to Discharge: No Barriers Identified  Expected Discharge Plan and Services                           DME Arranged: Oxygen, 3-N-1(By [facility])           HH Agency: Hospice and [place] Date [place]: 08/03/19 Time [place]: [id number] Representative spoke with at [place]: Riesa Pope   Social Determinants of Health (SDOH) Interventions    Readmission Risk Interventions No flowsheet data found.

## 2019-08-04 NOTE — Plan of Care (Signed)
?  Problem: Coping: ?Goal: Level of anxiety will decrease ?Outcome: Progressing ?  ?Problem: Safety: ?Goal: Ability to remain free from injury will improve ?Outcome: Progressing ?  ?

## 2019-08-04 NOTE — Progress Notes (Signed)
Manufacturing engineer  Hospice eligibility confirmed. DME delivery confirmed. [employer] requesting that patient's oxygen mask travel home with him. Spoke with [facility] and bedside RN aware. Requested [facility] be called asap to coordinate hospice admission visit this afternoon.   Please send home with patient signed and completed DNR.   Please send home with patient scripts for any medication he does not already have.   [facility] referral specialist will contact family to arrange first visit.   Thank you,  Erling Conte, LCSW [phone]

## 2019-10-03 NOTE — Telephone Encounter (Signed)
Cancelled - Monitor returned 08/18/2019

## 2019-11-11 ENCOUNTER — Inpatient Hospital Stay (HOSPITAL_COMMUNITY)
Admission: EM | Admit: 2019-11-11 | Discharge: 2019-11-25 | DRG: 291 | Disposition: E | Attending: Internal Medicine | Admitting: Internal Medicine

## 2019-11-11 ENCOUNTER — Encounter (HOSPITAL_COMMUNITY): Payer: Self-pay

## 2019-11-11 ENCOUNTER — Emergency Department (HOSPITAL_COMMUNITY)

## 2019-11-11 DIAGNOSIS — Z6823 Body mass index (BMI) 23.0-23.9, adult: Secondary | ICD-10-CM

## 2019-11-11 DIAGNOSIS — D631 Anemia in chronic kidney disease: Secondary | ICD-10-CM | POA: Diagnosis present

## 2019-11-11 DIAGNOSIS — I132 Hypertensive heart and chronic kidney disease with heart failure and with stage 5 chronic kidney disease, or end stage renal disease: Secondary | ICD-10-CM | POA: Diagnosis not present

## 2019-11-11 DIAGNOSIS — F17290 Nicotine dependence, other tobacco product, uncomplicated: Secondary | ICD-10-CM | POA: Diagnosis present

## 2019-11-11 DIAGNOSIS — E785 Hyperlipidemia, unspecified: Secondary | ICD-10-CM | POA: Diagnosis present

## 2019-11-11 DIAGNOSIS — R64 Cachexia: Secondary | ICD-10-CM | POA: Diagnosis present

## 2019-11-11 DIAGNOSIS — N400 Enlarged prostate without lower urinary tract symptoms: Secondary | ICD-10-CM | POA: Diagnosis present

## 2019-11-11 DIAGNOSIS — I739 Peripheral vascular disease, unspecified: Secondary | ICD-10-CM | POA: Diagnosis present

## 2019-11-11 DIAGNOSIS — Z8673 Personal history of transient ischemic attack (TIA), and cerebral infarction without residual deficits: Secondary | ICD-10-CM

## 2019-11-11 DIAGNOSIS — Z9981 Dependence on supplemental oxygen: Secondary | ICD-10-CM

## 2019-11-11 DIAGNOSIS — M48061 Spinal stenosis, lumbar region without neurogenic claudication: Secondary | ICD-10-CM | POA: Diagnosis present

## 2019-11-11 DIAGNOSIS — J9601 Acute respiratory failure with hypoxia: Secondary | ICD-10-CM | POA: Diagnosis present

## 2019-11-11 DIAGNOSIS — G8929 Other chronic pain: Secondary | ICD-10-CM | POA: Diagnosis present

## 2019-11-11 DIAGNOSIS — Z515 Encounter for palliative care: Secondary | ICD-10-CM | POA: Diagnosis not present

## 2019-11-11 DIAGNOSIS — Z79891 Long term (current) use of opiate analgesic: Secondary | ICD-10-CM

## 2019-11-11 DIAGNOSIS — E875 Hyperkalemia: Secondary | ICD-10-CM | POA: Diagnosis present

## 2019-11-11 DIAGNOSIS — Z79899 Other long term (current) drug therapy: Secondary | ICD-10-CM

## 2019-11-11 DIAGNOSIS — J189 Pneumonia, unspecified organism: Secondary | ICD-10-CM | POA: Diagnosis present

## 2019-11-11 DIAGNOSIS — R0602 Shortness of breath: Secondary | ICD-10-CM

## 2019-11-11 DIAGNOSIS — E8770 Fluid overload, unspecified: Secondary | ICD-10-CM

## 2019-11-11 DIAGNOSIS — N186 End stage renal disease: Secondary | ICD-10-CM | POA: Diagnosis present

## 2019-11-11 DIAGNOSIS — N179 Acute kidney failure, unspecified: Secondary | ICD-10-CM | POA: Diagnosis present

## 2019-11-11 DIAGNOSIS — Z20822 Contact with and (suspected) exposure to covid-19: Secondary | ICD-10-CM | POA: Diagnosis present

## 2019-11-11 DIAGNOSIS — I493 Ventricular premature depolarization: Secondary | ICD-10-CM | POA: Diagnosis present

## 2019-11-11 DIAGNOSIS — Z66 Do not resuscitate: Secondary | ICD-10-CM | POA: Diagnosis not present

## 2019-11-11 DIAGNOSIS — Z7982 Long term (current) use of aspirin: Secondary | ICD-10-CM

## 2019-11-11 DIAGNOSIS — K219 Gastro-esophageal reflux disease without esophagitis: Secondary | ICD-10-CM | POA: Diagnosis present

## 2019-11-11 DIAGNOSIS — Z89511 Acquired absence of right leg below knee: Secondary | ICD-10-CM

## 2019-11-11 DIAGNOSIS — I5023 Acute on chronic systolic (congestive) heart failure: Secondary | ICD-10-CM | POA: Diagnosis present

## 2019-11-11 DIAGNOSIS — Z8249 Family history of ischemic heart disease and other diseases of the circulatory system: Secondary | ICD-10-CM

## 2019-11-11 DIAGNOSIS — J9621 Acute and chronic respiratory failure with hypoxia: Secondary | ICD-10-CM | POA: Diagnosis present

## 2019-11-11 DIAGNOSIS — Z7902 Long term (current) use of antithrombotics/antiplatelets: Secondary | ICD-10-CM

## 2019-11-11 DIAGNOSIS — I1310 Hypertensive heart and chronic kidney disease without heart failure, with stage 1 through stage 4 chronic kidney disease, or unspecified chronic kidney disease: Secondary | ICD-10-CM

## 2019-11-11 DIAGNOSIS — R7303 Prediabetes: Secondary | ICD-10-CM | POA: Diagnosis present

## 2019-11-11 DIAGNOSIS — R0902 Hypoxemia: Secondary | ICD-10-CM

## 2019-11-11 DIAGNOSIS — M549 Dorsalgia, unspecified: Secondary | ICD-10-CM | POA: Diagnosis present

## 2019-11-11 LAB — CBC
HCT: 32.4 % — ABNORMAL LOW (ref 39.0–52.0)
Hemoglobin: 9.9 g/dL — ABNORMAL LOW (ref 13.0–17.0)
MCH: 29.7 pg (ref 26.0–34.0)
MCHC: 30.6 g/dL (ref 30.0–36.0)
MCV: 97.3 fL (ref 80.0–100.0)
Platelets: 180 10*3/uL (ref 150–400)
RBC: 3.33 MIL/uL — ABNORMAL LOW (ref 4.22–5.81)
RDW: 17.8 % — ABNORMAL HIGH (ref 11.5–15.5)
WBC: 11 10*3/uL — ABNORMAL HIGH (ref 4.0–10.5)
nRBC: 0.2 % (ref 0.0–0.2)

## 2019-11-11 MED ORDER — SODIUM CHLORIDE 0.9% FLUSH: 3.0000 mL | Freq: Once | INTRAVENOUS | Status: DC

## 2019-11-11 NOTE — ED Triage Notes (Signed)
Pt bib gcems w/ c/o sob, wears 4 lpm o2 at baseline, 96% on baseline o2. Pt has hx of ESRD, not on dialysis. EMS VS: 132/68 HR 105

## 2019-11-12 ENCOUNTER — Other Ambulatory Visit: Payer: Self-pay

## 2019-11-12 DIAGNOSIS — Z7982 Long term (current) use of aspirin: Secondary | ICD-10-CM | POA: Diagnosis not present

## 2019-11-12 DIAGNOSIS — Z8249 Family history of ischemic heart disease and other diseases of the circulatory system: Secondary | ICD-10-CM | POA: Diagnosis not present

## 2019-11-12 DIAGNOSIS — J9621 Acute and chronic respiratory failure with hypoxia: Secondary | ICD-10-CM | POA: Diagnosis present

## 2019-11-12 DIAGNOSIS — R7303 Prediabetes: Secondary | ICD-10-CM | POA: Diagnosis present

## 2019-11-12 DIAGNOSIS — F17290 Nicotine dependence, other tobacco product, uncomplicated: Secondary | ICD-10-CM | POA: Diagnosis present

## 2019-11-12 DIAGNOSIS — E8770 Fluid overload, unspecified: Secondary | ICD-10-CM | POA: Diagnosis not present

## 2019-11-12 DIAGNOSIS — Z515 Encounter for palliative care: Secondary | ICD-10-CM | POA: Diagnosis not present

## 2019-11-12 DIAGNOSIS — G8929 Other chronic pain: Secondary | ICD-10-CM | POA: Diagnosis present

## 2019-11-12 DIAGNOSIS — E785 Hyperlipidemia, unspecified: Secondary | ICD-10-CM | POA: Diagnosis present

## 2019-11-12 DIAGNOSIS — I132 Hypertensive heart and chronic kidney disease with heart failure and with stage 5 chronic kidney disease, or end stage renal disease: Secondary | ICD-10-CM | POA: Diagnosis present

## 2019-11-12 DIAGNOSIS — R0602 Shortness of breath: Secondary | ICD-10-CM | POA: Diagnosis present

## 2019-11-12 DIAGNOSIS — Z7902 Long term (current) use of antithrombotics/antiplatelets: Secondary | ICD-10-CM | POA: Diagnosis not present

## 2019-11-12 DIAGNOSIS — Z20822 Contact with and (suspected) exposure to covid-19: Secondary | ICD-10-CM | POA: Diagnosis present

## 2019-11-12 DIAGNOSIS — J9601 Acute respiratory failure with hypoxia: Secondary | ICD-10-CM | POA: Diagnosis not present

## 2019-11-12 DIAGNOSIS — R64 Cachexia: Secondary | ICD-10-CM | POA: Diagnosis present

## 2019-11-12 DIAGNOSIS — K219 Gastro-esophageal reflux disease without esophagitis: Secondary | ICD-10-CM | POA: Diagnosis present

## 2019-11-12 DIAGNOSIS — N400 Enlarged prostate without lower urinary tract symptoms: Secondary | ICD-10-CM | POA: Diagnosis present

## 2019-11-12 DIAGNOSIS — I5023 Acute on chronic systolic (congestive) heart failure: Secondary | ICD-10-CM | POA: Diagnosis present

## 2019-11-12 DIAGNOSIS — Z8673 Personal history of transient ischemic attack (TIA), and cerebral infarction without residual deficits: Secondary | ICD-10-CM | POA: Diagnosis not present

## 2019-11-12 DIAGNOSIS — J189 Pneumonia, unspecified organism: Secondary | ICD-10-CM | POA: Diagnosis present

## 2019-11-12 DIAGNOSIS — N186 End stage renal disease: Secondary | ICD-10-CM | POA: Diagnosis present

## 2019-11-12 DIAGNOSIS — Z79891 Long term (current) use of opiate analgesic: Secondary | ICD-10-CM | POA: Diagnosis not present

## 2019-11-12 DIAGNOSIS — N170 Acute kidney failure with tubular necrosis: Secondary | ICD-10-CM | POA: Diagnosis not present

## 2019-11-12 DIAGNOSIS — Z79899 Other long term (current) drug therapy: Secondary | ICD-10-CM | POA: Diagnosis not present

## 2019-11-12 DIAGNOSIS — I739 Peripheral vascular disease, unspecified: Secondary | ICD-10-CM | POA: Diagnosis present

## 2019-11-12 DIAGNOSIS — M549 Dorsalgia, unspecified: Secondary | ICD-10-CM | POA: Diagnosis present

## 2019-11-12 DIAGNOSIS — Z66 Do not resuscitate: Secondary | ICD-10-CM | POA: Diagnosis not present

## 2019-11-12 DIAGNOSIS — N179 Acute kidney failure, unspecified: Secondary | ICD-10-CM | POA: Diagnosis present

## 2019-11-12 LAB — BASIC METABOLIC PANEL
Anion gap: 13 (ref 5–15)
BUN: 61 mg/dL — ABNORMAL HIGH (ref 8–23)
CO2: 17 mmol/L — ABNORMAL LOW (ref 22–32)
Calcium: 9.1 mg/dL (ref 8.9–10.3)
Chloride: 109 mmol/L (ref 98–111)
Creatinine, Ser: 7.78 mg/dL — ABNORMAL HIGH (ref 0.61–1.24)
GFR calc Af Amer: 7 mL/min — ABNORMAL LOW (ref >60)
GFR calc non Af Amer: 6 mL/min — ABNORMAL LOW (ref >60)
Glucose, Bld: 109 mg/dL — ABNORMAL HIGH (ref 70–99)
Potassium: 5.6 mmol/L — ABNORMAL HIGH (ref 3.5–5.1)
Sodium: 139 mmol/L (ref 135–145)

## 2019-11-12 LAB — SARS CORONAVIRUS 2 BY RT PCR (HOSPITAL ORDER, PERFORMED IN ~~LOC~~ HOSPITAL LAB): SARS Coronavirus 2: NEGATIVE

## 2019-11-12 LAB — BRAIN NATRIURETIC PEPTIDE: B Natriuretic Peptide: 1943.4 pg/mL — ABNORMAL HIGH (ref 0.0–100.0)

## 2019-11-12 LAB — TROPONIN I (HIGH SENSITIVITY)
Troponin I (High Sensitivity): 73 ng/L — ABNORMAL HIGH (ref <18)
Troponin I (High Sensitivity): 74 ng/L — ABNORMAL HIGH (ref <18)

## 2019-11-12 MED ORDER — SODIUM CHLORIDE 0.9 % IV SOLN
500.0000 mg | Freq: Once | INTRAVENOUS | Status: AC
  Administered 2019-11-12: 500 mg via INTRAVENOUS
  Filled 2019-11-12: qty 500

## 2019-11-12 MED ORDER — CLOPIDOGREL BISULFATE 75 MG PO TABS
75.0000 mg | ORAL_TABLET | Freq: Every day | ORAL | Status: DC
  Administered 2019-11-12 – 2019-11-13 (×2): 75 mg via ORAL
  Filled 2019-11-12 (×3): qty 1

## 2019-11-12 MED ORDER — SODIUM CHLORIDE 0.9 % IV SOLN
500.0000 mg | INTRAVENOUS | Status: AC
  Administered 2019-11-13 – 2019-11-14 (×2): 500 mg via INTRAVENOUS
  Filled 2019-11-12 (×2): qty 500

## 2019-11-12 MED ORDER — SODIUM CHLORIDE 0.9 % IV SOLN
1.0000 g | Freq: Once | INTRAVENOUS | Status: AC
  Administered 2019-11-12: 1 g via INTRAVENOUS
  Filled 2019-11-12: qty 10

## 2019-11-12 MED ORDER — ASPIRIN EC 81 MG PO TBEC
81.0000 mg | DELAYED_RELEASE_TABLET | Freq: Every day | ORAL | Status: DC
  Administered 2019-11-12 – 2019-11-13 (×2): 81 mg via ORAL
  Filled 2019-11-12 (×2): qty 1

## 2019-11-12 MED ORDER — FUROSEMIDE 10 MG/ML IJ SOLN
160.0000 mg | Freq: Once | INTRAVENOUS | Status: AC
  Administered 2019-11-12: 160 mg via INTRAVENOUS
  Filled 2019-11-12: qty 16

## 2019-11-12 MED ORDER — GABAPENTIN 300 MG PO CAPS
300.0000 mg | ORAL_CAPSULE | Freq: Every day | ORAL | Status: DC | PRN
  Administered 2019-11-13: 300 mg via ORAL
  Filled 2019-11-12: qty 1

## 2019-11-12 MED ORDER — IPRATROPIUM-ALBUTEROL 0.5-2.5 (3) MG/3ML IN SOLN: 3.0000 mL | RESPIRATORY_TRACT | Status: DC | PRN

## 2019-11-12 MED ORDER — ALBUTEROL SULFATE (2.5 MG/3ML) 0.083% IN NEBU
2.5000 mg | INHALATION_SOLUTION | RESPIRATORY_TRACT | Status: DC | PRN
  Filled 2019-11-12: qty 3

## 2019-11-12 MED ORDER — ONDANSETRON HCL 4 MG PO TABS: 4.0000 mg | ORAL_TABLET | Freq: Four times a day (QID) | ORAL | Status: DC | PRN

## 2019-11-12 MED ORDER — ACETAMINOPHEN 650 MG RE SUPP
650.0000 mg | Freq: Four times a day (QID) | RECTAL | Status: DC | PRN
  Filled 2019-11-12: qty 1

## 2019-11-12 MED ORDER — FAMOTIDINE 20 MG PO TABS
20.0000 mg | ORAL_TABLET | Freq: Every day | ORAL | Status: DC
  Administered 2019-11-12 – 2019-11-15 (×4): 20 mg via ORAL
  Filled 2019-11-12 (×4): qty 1

## 2019-11-12 MED ORDER — ONDANSETRON HCL 4 MG/2ML IJ SOLN: 4.0000 mg | Freq: Four times a day (QID) | INTRAMUSCULAR | Status: DC | PRN

## 2019-11-12 MED ORDER — AMLODIPINE BESYLATE 10 MG PO TABS
10.0000 mg | ORAL_TABLET | Freq: Every day | ORAL | Status: DC
  Administered 2019-11-12 – 2019-11-14 (×3): 10 mg via ORAL
  Filled 2019-11-12: qty 2
  Filled 2019-11-12 (×2): qty 1

## 2019-11-12 MED ORDER — SODIUM CHLORIDE 0.9 % IV SOLN
2.0000 g | INTRAVENOUS | Status: DC
  Administered 2019-11-13 – 2019-11-15 (×3): 2 g via INTRAVENOUS
  Filled 2019-11-12 (×3): qty 2

## 2019-11-12 MED ORDER — POLYETHYLENE GLYCOL 3350 17 G PO PACK: 17.0000 g | PACK | Freq: Every day | ORAL | Status: DC | PRN

## 2019-11-12 MED ORDER — HEPARIN SODIUM (PORCINE) 5000 UNIT/ML IJ SOLN
5000.0000 [IU] | Freq: Three times a day (TID) | INTRAMUSCULAR | Status: DC
  Administered 2019-11-12 – 2019-11-14 (×6): 5000 [IU] via SUBCUTANEOUS
  Filled 2019-11-12 (×6): qty 1

## 2019-11-12 MED ORDER — ACETAMINOPHEN 325 MG PO TABS
650.0000 mg | ORAL_TABLET | Freq: Four times a day (QID) | ORAL | Status: DC | PRN
  Administered 2019-11-13: 650 mg via ORAL
  Filled 2019-11-12: qty 2

## 2019-11-12 NOTE — H&P (Signed)
Date: 11/12/2019               Patient Name:  Tristain Daily MRN: [id number]  DOB: Oct 25, 1934 Age / Sex: 85 y.o., male   PCP: Lin Landsman, MD         Medical Service: Internal Medicine Teaching Service         Attending Physician: Dr. Velna Ochs, MD    First Contact: Dr. Gilford Rile Pager: [phone]  Second Contact: Dr. Koleen Distance Pager: [phone]       After Hours (After 5p/  First Contact Pager: [phone]  weekends / holidays): Second Contact Pager: [phone]   Chief Complaint: DOE  History of Present Illness: Errin Chewning is an 85 year old male with hypertension, CKD stage V, heart failure reduced ejection fraction, prior CVA, and peripheral vascular disease with aorta femoral bypass presenting to the emergency department with three days of progressive dyspnea on exertion. History was obtained via the patient and his daughter.  Patient was doing well at home until approximately three days ago when he began to experience progressive dyspnea on exertion. He would get very tired and short of breath with any type of movement. He will try his albuterol and do an abscess which we temporarily help however he would then become short of breath again. He is normally on 2 L per minute nasal cannula however has had to increase this to 4 L per minute nasal cannula for comfort. He has been taking all his medications as prescribed. He does have some lower extremity edema but the family states that this is normal compared to prior. They just noticed significant functional decline over the last three days. He is not eating but continues to drink. He is not had a fevers, rhinorrhea, chills, nausea/vomiting, abdominal pain, diarrhea, dysuria, myalgias, arthralgias, new rash.  After the patient's last hospitalization he was discharged on hospice services. They continue to follow with hospice at home. They state that a nurse comes out twice per week. At his last hospital admission he was made a DNR;  however, shortly after arriving back home the patient decided to revoke his DNR. He would like to be resuscitated. We discussed doing CPR and intubation may provide quantity but not quality of life. He voices understanding. His daughter at bedside wishes that he would remain a DNR but wants to respect his decision. We discussed consulting palliative care. They are agreeable.  Meds:  Current Meds  Medication Sig  . amLODipine (NORVASC) 10 MG tablet Take 10 mg by mouth daily.  Marland Kitchen aspirin EC 81 MG tablet Take 81 mg by mouth daily.  . clopidogrel (PLAVIX) 75 MG tablet Take 1 tablet (75 mg total) by mouth daily with breakfast.  . famotidine (PEPCID) 20 MG tablet Take 20 mg by mouth daily.  Marland Kitchen gabapentin (NEURONTIN) 300 MG capsule Take 300 mg by mouth daily as needed (pain).   . hydrALAZINE (APRESOLINE) 25 MG tablet Take 25 mg by mouth daily.  Marland Kitchen HYDROcodone-acetaminophen (NORCO/VICODIN) 5-325 MG tablet Take 1 tablet by mouth every 6 (six) hours as needed for pain.  Marland Kitchen ipratropium-albuterol (DUONEB) 0.5-2.5 (3) MG/3ML SOLN Take 3 mLs by nebulization every 4 (four) hours as needed for wheezing or shortness of breath.   Allergies: Allergies as of 11/06/2019  . (No Known Allergies)   Past Medical History:  Diagnosis Date  . Arthritis    hip  . Borderline diabetes   . BPH (benign prostatic hyperplasia)    takes Avodart daily  . Bronchitis  yrs ago  . Cervical spondylosis with myelopathy   . Cervical spondylosis without myelopathy   . Chronic back pain   . CKD (chronic kidney disease), stage III   . Constipation    Metamucil prn  . Diverticulosis   . ED (erectile dysfunction)    takes Cialis as needed as well as Viagra  . Embolism - blood clot    in stomach  . GERD (gastroesophageal reflux disease)    takes Nexium daily  . Hyperlipidemia    takes Zocor nightly  . Hypertension    takes Losartan daily  . Lumbar spondylosis with myelopathy   . Nocturia   . Pain in joint, hand   .  Peripheral vascular disease ([place])   . Stroke ([place])   . Urinary urgency    Family History  Problem Relation Age of Onset  . Heart disease Mother        Before age 36  . Heart disease Brother        NOT  before age 15  . Heart attack Brother   . Anesthesia problems Neg Hx   . Hypotension Neg Hx   . Malignant hyperthermia Neg Hx   . Pseudochol deficiency Neg Hx    Social History   Socioeconomic History  . Marital status: Widowed    Spouse name: Not on file  . Number of children: Not on file  . Years of education: Not on file  . Highest education level: Not on file  Occupational History  . Not on file  Tobacco Use  . Smoking status: Current Some Day Smoker    Years: 0.50    Types: Cigars    Last attempt to quit: 04/29/1992    Years since quitting: 27.5  . Smokeless tobacco: Never Used  Substance and Sexual Activity  . Alcohol use: No  . Drug use: No    Types: Marijuana    Comment: Hx  . Sexual activity: Not on file  Other Topics Concern  . Not on file  Social History Narrative  . Not on file   Social Determinants of Health   Financial Resource Strain:   . Difficulty of Paying Living Expenses:   Food Insecurity:   . Worried About Charity fundraiser in the Last Year:   . Arboriculturist in the Last Year:   Transportation Needs:   . Film/video editor (Medical):   Marland Kitchen Lack of Transportation (Non-Medical):   Physical Activity:   . Days of Exercise per Week:   . Minutes of Exercise per Session:   Stress:   . Feeling of Stress :   Social Connections:   . Frequency of Communication with Friends and Family:   . Frequency of Social Gatherings with Friends and Family:   . Attends Religious Services:   . Active Member of Clubs or Organizations:   . Attends Archivist Meetings:   Marland Kitchen Marital Status:   Intimate Partner Violence:   . Fear of Current or Ex-Partner:   . Emotionally Abused:   Marland Kitchen Physically Abused:   . Sexually Abused:    Review of  Systems: A complete ROS was negative except as per HPI.  Physical Exam: Blood pressure 130/75, pulse 90, temperature (!) 97.4 F (36.3 C), temperature source Oral, resp. rate 19, SpO2 94 %.  General: Elderly cachectic male in respiratory distress HENT: Normocephalic, atraumatic, moist mucus membranes Pulm: Limited by ability to move patient. Good air movement with no obvious wheezing or  crackles  CV: RRR, no murmurs, no rubs  Abdomen: Active bowel sounds, soft, non-distended, no tenderness to palpation  Extremities: Pulses palpable in all extremities, 1-2+ LE edema  Skin: Warm and dry  Neuro: Alert, answering yes/no questions  Assessment & Plan by Problem: Active Problems:   Acute respiratory failure with hypoxia [facility])  Amitai Delaughter is an 85 year old male with hypertension, CKD stage V, heart failure reduced ejection fraction, prior CVA, and peripheral vascular disease with aorta femoral bypass presenting to the emergency department with three days of progressive dyspnea on exertion and acute on chronic hypoxic respiratory failure. He was subsequently admitted for further evaluation/management.   Acute on Chronic Hypoxic Respiratory Failure  Acute on Chronic HFrEF vs Bacterial PNA - Increased oxygen requirement from 2L/min [place] to 4L/min [place] with evidence of hypervolemia and severe bibasilar infiltrate on CXR  - Will attempted to diurese with IV furosemide. Will need to watch renal function closely. He is not a candidate for HD per nephrology - Cannot exclude PNA so will continue ceftriaxone 2g QD for 5 days and Azithromycin 500 mg QD for 3 days  - Wean oxygen to home requirement (2L/min ) as tolerated.    CKD Stage V Anemia of Chronic Disease - Patient is not a candidate for HD per nephrology  - Slightly hyperkalemic. Giving lasix  - Avoid nephrotoxic medications  - Monitor function closely   GOC - Previously saw palliative care team on last admission. Was transitioned to DNR  and discharged with Home Hospice. Has continued Home Hospice services but revoked his DNR. We discussed doing CPR and intubation may provide quantity but not quality of life. He voices understanding. His daughter at bedside wishes that he would remain a DNR but wants to respect his decision.  - Will consult palliative care   Diet: Regular VTE ppx: Heparin  CODE STATUS: Full Code  Dispo: Admit patient to Inpatient with expected length of stay greater than 2 midnights.  SignedIna Homes, MD 11/12/2019, 10:22 AM  Pager: [phone]

## 2019-11-12 NOTE — ED Provider Notes (Addendum)
[facility] EMERGENCY DEPARTMENT Provider Note   CSN: [id number] Arrival date & time: 11/09/2019  2248     History Chief Complaint  Patient presents with  . Shortness of Breath    Bobby Vega is a 85 y.o. male.  The history is provided by the patient, a relative and medical records. No language interpreter was used.  Shortness of Breath Severity:  Severe Onset quality:  Gradual Duration:  3 days Timing:  Constant Progression:  Worsening Chronicity:  Recurrent Context: URI   Relieved by:  Nothing Worsened by:  Nothing Ineffective treatments:  None tried Associated symptoms: cough   Associated symptoms: no abdominal pain, no chest pain, no diaphoresis, no fever (chills), no headaches, no neck pain, no sputum production, no vomiting and no wheezing        Past Medical History:  Diagnosis Date  . Arthritis    hip  . Borderline diabetes   . BPH (benign prostatic hyperplasia)    takes Avodart daily  . Bronchitis    yrs ago  . Cervical spondylosis with myelopathy   . Cervical spondylosis without myelopathy   . Chronic back pain   . CKD (chronic kidney disease), stage III   . Constipation    Metamucil prn  . Diverticulosis   . ED (erectile dysfunction)    takes Cialis as needed as well as Viagra  . Embolism - blood clot    in stomach  . GERD (gastroesophageal reflux disease)    takes Nexium daily  . Hyperlipidemia    takes Zocor nightly  . Hypertension    takes Losartan daily  . Lumbar spondylosis with myelopathy   . Nocturia   . Pain in joint, hand   . Peripheral vascular disease ([place])   . Stroke ([place])   . Urinary urgency     Patient Active Problem List   Diagnosis Date Noted  . Terminal care   . Palliative care by specialist   . Goals of care, counseling/discussion   . Acute congestive heart failure ([place])   . Chronic renal failure, stage 5 (HCC)   . Acute exacerbation of CHF (congestive heart failure) ([place]) 07/31/2019  .  Diverticulosis of colon with hemorrhage   . Acute GI bleeding 07/15/2019  . Lower GI bleed 07/15/2019  . AKI (acute kidney injury) ([place]) 05/22/2016  . CKD (chronic kidney disease) stage 3, GFR 30-59 ml/min 05/22/2016  . Acute encephalopathy 05/22/2016  . Acute on chronic diastolic heart failure ([place]) 05/22/2016  . Hypernatremia 05/22/2016  . Syncope 05/22/2016  . History of CVA with residual deficit   . Benign essential HTN   . Dementia without behavioral disturbance ([place])   . PVD (peripheral vascular disease) ([place])   . Diastolic dysfunction   . Urethral stricture   . Benign prostatic hyperplasia without lower urinary tract symptoms   . Leukocytosis   . Dysphagia   . Acute blood loss anemia   . Respiratory distress   . Acute on chronic renal failure ([place])   . Aspiration pneumonia of both lungs ([place]) 05/13/2016  . Acute hypoxemic respiratory failure ([place]) 05/13/2016  . Pain in limb 06/10/2014  . Numbness-Bilateral Shoulder-Hand 06/10/2014  . Hand weakness 06/04/2013  . Left arm weakness 06/04/2013  . CVA (cerebral vascular accident) ([place]) 05/17/2013  . Other and unspecified hyperlipidemia 05/16/2013  . GERD (gastroesophageal reflux disease)   . Hypertension   . Right arm weakness 05/15/2013  . Open wound of abdominal wall, lateral, without mention of complication  08/07/2011  . PVD (peripheral vascular disease) with claudication ([place]) 08/02/2011  . Atherosclerosis of native arteries of the extremities with intermittent claudication 07/10/2011  . Occlusion and stenosis of carotid artery without mention of cerebral infarction 07/10/2011  . Peripheral vascular disease, unspecified ([place]) 06/26/2011    Past Surgical History:  Procedure Laterality Date  . ABDOMINAL AORTAGRAM N/A 07/04/2011   Procedure: ABDOMINAL Maxcine Ham;  Surgeon: Serafina Mitchell, MD;  Location: [facility] CATH LAB;  Service: Cardiovascular;  Laterality: N/A;  . AORTA - BILATERAL FEMORAL ARTERY BYPASS GRAFT  07/13/2011    Procedure: AORTA BIFEMORAL BYPASS GRAFT;  Surgeon: Theotis Burrow, MD;  Location: [facility];  Service: Vascular;  Laterality: Bilateral;  . BIOPSY  07/20/2019   Procedure: BIOPSY;  Surgeon: [name], MD;  Location: [facility];  Service: Endoscopy;;  . COLONOSCOPY WITH PROPOFOL N/A 07/20/2019   Procedure: COLONOSCOPY WITH PROPOFOL;  Surgeon: [name], MD;  Location: [facility] ENDOSCOPY;  Service: Endoscopy;  Laterality: N/A;  . CYSTOSCOPY  07/13/2011   Procedure: CYSTOSCOPY;  Surgeon: Hanley Ben, MD;  Location: [facility];  Service: Urology;  Laterality: N/A;  cystoscopy,dilatation & insertion of councill foley catheter  . LOWER EXTREMITY ANGIOGRAM Bilateral 07/04/2011   Procedure: LOWER EXTREMITY ANGIOGRAM;  Surgeon: Serafina Mitchell, MD;  Location: [facility] CATH LAB;  Service: Cardiovascular;  Laterality: Bilateral;  . SKIN GRAFT  1983       Family History  Problem Relation Age of Onset  . Heart disease Mother        Before age 64  . Heart disease Brother        NOT  before age 45  . Heart attack Brother   . Anesthesia problems Neg Hx   . Hypotension Neg Hx   . Malignant hyperthermia Neg Hx   . Pseudochol deficiency Neg Hx     Social History   Tobacco Use  . Smoking status: Current Some Day Smoker    Years: 0.50    Types: Cigars    Last attempt to quit: 04/29/1992    Years since quitting: 27.5  . Smokeless tobacco: Never Used  Substance Use Topics  . Alcohol use: No  . Drug use: No    Types: Marijuana    Comment: Hx    Home Medications Prior to Admission medications   Medication Sig Start Date End Date Taking? Authorizing Provider  albuterol (VENTOLIN HFA) 108 (90 Base) MCG/ACT inhaler Inhale 1-2 puffs into the lungs every 4 (four) hours as needed for shortness of breath or wheezing. 05/29/19   [provider]  amLODipine (NORVASC) 10 MG tablet Take 10 mg by mouth daily. 04/25/19   [provider]  aspirin EC 81 MG tablet Take 81 mg by mouth daily.    [provider]  clopidogrel (PLAVIX) 75 MG tablet Take 1 tablet (75 mg total) by mouth daily with breakfast. 05/18/13   Rai, Ripudeep K, MD  famotidine (PEPCID) 20 MG tablet Take 20 mg by mouth daily.    [provider]    Allergies    Patient has no known allergies.  Review of Systems   Review of Systems  Constitutional: Positive for chills and fatigue. Negative for diaphoresis and fever (chills).  HENT: Negative for congestion.   Eyes: Negative for visual disturbance.  Respiratory: Positive for cough and shortness of breath. Negative for sputum production, chest tightness, wheezing and stridor.   Cardiovascular: Positive for leg swelling. Negative for chest pain and palpitations.  Gastrointestinal: Negative for  abdominal pain, constipation, diarrhea, nausea and vomiting.  Genitourinary: Negative for dysuria and flank pain.  Musculoskeletal: Negative for back pain, neck pain and neck stiffness.  Neurological: Negative for dizziness, weakness, light-headedness and headaches.  Psychiatric/Behavioral: Negative for agitation and confusion.  All other systems reviewed and are negative.   Physical Exam Updated Vital Signs BP 129/77 (BP Location: Left Arm)   Pulse 88   Temp (!) 97.4 F (36.3 C) (Oral)   Resp 20   SpO2 99%   Physical Exam Vitals and nursing note reviewed.  Constitutional:      General: He is not in acute distress.    Appearance: He is well-developed. He is not ill-appearing, toxic-appearing or diaphoretic.  HENT:     Head: Normocephalic and atraumatic.     Mouth/Throat:     Mouth: Mucous membranes are moist.     Pharynx: No pharyngeal swelling or oropharyngeal exudate.  Eyes:     Conjunctiva/sclera: Conjunctivae normal.     Pupils: Pupils are equal, round, and reactive to light.  Cardiovascular:     Rate and Rhythm: Normal rate and regular rhythm.     Heart sounds: No murmur.  Pulmonary:     Effort: Pulmonary effort is normal. Tachypnea present. No  respiratory distress.     Breath sounds: Rhonchi and rales present. No wheezing.  Chest:     Chest wall: No tenderness.  Abdominal:     Palpations: Abdomen is soft.     Tenderness: There is no abdominal tenderness.  Musculoskeletal:     Cervical back: Neck supple.     Right lower leg: No tenderness. Edema present.     Left lower leg: No tenderness. Edema present.  Skin:    General: Skin is warm and dry.     Capillary Refill: Capillary refill takes less than 2 seconds.  Neurological:     General: No focal deficit present.     Mental Status: He is alert.  Psychiatric:        Mood and Affect: Mood normal.     ED Results / Procedures / Treatments   Labs (all labs ordered are listed, but only abnormal results are displayed) Labs Reviewed  BASIC METABOLIC PANEL - Abnormal; Notable for the following components:      Result Value   Potassium 5.6 (*)    CO2 17 (*)    Glucose, Bld 109 (*)    BUN 61 (*)    Creatinine, Ser 7.78 (*)    GFR calc non Af Amer 6 (*)    GFR calc Af Amer 7 (*)    All other components within normal limits  CBC - Abnormal; Notable for the following components:   WBC 11.0 (*)    RBC 3.33 (*)    Hemoglobin 9.9 (*)    HCT 32.4 (*)    RDW 17.8 (*)    All other components within normal limits  BRAIN NATRIURETIC PEPTIDE - Abnormal; Notable for the following components:   B Natriuretic Peptide 1,943.4 (*)    All other components within normal limits  TROPONIN I (HIGH SENSITIVITY) - Abnormal; Notable for the following components:   Troponin I (High Sensitivity) 73 (*)    All other components within normal limits  TROPONIN I (HIGH SENSITIVITY) - Abnormal; Notable for the following components:   Troponin I (High Sensitivity) 74 (*)    All other components within normal limits  SARS CORONAVIRUS 2 BY RT PCR (HOSPITAL ORDER, [facility])  CULTURE, BLOOD (ROUTINE X 2)  CULTURE, BLOOD (ROUTINE X 2)    EKG EKG  Interpretation  Date/Time:  Tuesday Nov 11 2019 22:48:39 EDT Ventricular Rate:  100 PR Interval:  192 QRS Duration: 84 QT Interval:  364 QTC Calculation: 469 R Axis:   70 Text Interpretation: Sinus rhythm with occasional Premature ventricular complexes Possible Left atrial enlargement Septal infarct , age undetermined Abnormal ECG when compared to prior, similar appearence with continued PVC. more artifact. no STEMI Confirmed by Antony Blackbird [phone]) on 11/12/2019 8:30:06 AM   Radiology DG Chest 2 View  Result Date: 11/02/2019 CLINICAL DATA:  Shortness of breath. EXAM: CHEST - 2 VIEW COMPARISON:  July 31, 2019 FINDINGS: Mild to moderate severity areas of atelectasis and/or infiltrate are seen within the bilateral lung bases. Small bilateral pleural effusions are seen. There is no evidence of a pneumothorax. The cardiac silhouette is mildly enlarged. Degenerative changes seen throughout the thoracic spine. IMPRESSION: 1. Mild to moderate severity bibasilar atelectasis and/or infiltrate. 2. Small bilateral pleural effusions. Electronically Signed   By: Virgina Norfolk M.D.   On: 10/26/2019 23:58    Procedures Procedures (including critical care time)  CRITICAL CARE Performed by: Gwenyth Allegra Margan Elias Total critical care time: 35 minutes Critical care time was exclusive of separately billable procedures and treating other patients. Critical care was necessary to treat or prevent imminent or life-threatening deterioration. Critical care was time spent personally by me on the following activities: development of treatment plan with patient and/or surrogate as well as nursing, discussions with consultants, evaluation of patient's response to treatment, examination of patient, obtaining history from patient or surrogate, ordering and performing treatments and interventions, ordering and review of laboratory studies, ordering and review of radiographic studies, pulse oximetry and re-evaluation  of patient's condition.   Medications Ordered in ED Medications  sodium chloride flush (NS) 0.9 % injection 3 mL (has no administration in time range)  furosemide (LASIX) 160 mg in dextrose 5 % 50 mL IVPB (has no administration in time range)  albuterol (PROVENTIL) (2.5 MG/3ML) 0.083% nebulizer solution 2.5 mg (has no administration in time range)  amLODipine (NORVASC) tablet 10 mg (10 mg Oral Given 11/12/19 1034)  aspirin EC tablet 81 mg (81 mg Oral Given 11/12/19 1035)  clopidogrel (PLAVIX) tablet 75 mg (75 mg Oral Given 11/12/19 1034)  famotidine (PEPCID) tablet 20 mg (20 mg Oral Given 11/12/19 1035)  gabapentin (NEURONTIN) capsule 300 mg (has no administration in time range)  ipratropium-albuterol (DUONEB) 0.5-2.5 (3) MG/3ML nebulizer solution 3 mL (has no administration in time range)  heparin injection 5,000 Units (has no administration in time range)  acetaminophen (TYLENOL) tablet 650 mg (has no administration in time range)    Or  acetaminophen (TYLENOL) suppository 650 mg (has no administration in time range)  polyethylene glycol (MIRALAX / GLYCOLAX) packet 17 g (has no administration in time range)  ondansetron (ZOFRAN) tablet 4 mg (has no administration in time range)    Or  ondansetron (ZOFRAN) injection 4 mg (has no administration in time range)  cefTRIAXone (ROCEPHIN) 2 g in sodium chloride 0.9 % 100 mL IVPB (has no administration in time range)  azithromycin (ZITHROMAX) 500 mg in sodium chloride 0.9 % 250 mL IVPB (has no administration in time range)  cefTRIAXone (ROCEPHIN) 1 g in sodium chloride 0.9 % 100 mL IVPB (0 g Intravenous Stopped 11/12/19 1007)  azithromycin (ZITHROMAX) 500 mg in sodium chloride 0.9 % 250 mL IVPB (0 mg Intravenous Stopped 11/12/19 1058)  ED Course  I have reviewed the triage vital signs and the nursing notes.  Pertinent labs & imaging results that were available during my care of the patient were reviewed by me and considered in my medical decision  making (see chart for details).    MDM Rules/Calculators/A&P                      Bobby Vega is a 85 y.o. male with a past medical history significant for recent medicine for fluid overload and CHF exacerbation, ESRD not on dialysis with uptrending creatinine, prior stroke, hypertension, GERD, PVD status post peripheral bypass previously, and mild dementia who presents with family for worsening shortness of breath.  According to them, for the last few days, patient has worsened breathing again.  She reports this is similar to when he was admitted back in February and was found to have acute heart failure exacerbation.  Chart review shows that patient was deemed to not be a candidate to go back on dialysis and at discharge, creatinine was up trending.  Plan was to continue home oxygen and monitor.  They report that the patient's breathing is worse over the last several days and is also having less p.o. intake.  She reports that he is gone from needing 2 L to she up to 4 L and he was still having hypoxia whenever he talk to try to ambulate.  On my evaluation, on 4 L, he is dropping into the 90% range.  I upped him to 5 L to maintain oxygen saturations around 94%.  He is complaining of some continued cough as well as some chills.  He denies any urinary symptoms but does report some constipation.  Denies any abdominal pain currently denies chest pain.  Reports feeling very fatigued and tired.  On exam, lungs have some mild rhonchi as well as extensive rales.  Chest and abdomen nontender.  Patient has some lower extremity edema on both sides.  Pulses are present.  Patient is tired but arousable to voice and will speak normally in conversation.  Of note, patient has been in the emergency department for approximately 9 hours and 45 minutes prior to my initial evaluation.  Family is concerned about him going home as this is how he looked before he started to acutely decompensate and need BiPAP last  time.  Clinically I am concerned that patient may have worsened kidney function and is not able to keep the fluid off.  I am concerned he is having another CHF exacerbation with fluid overload as well as possible pneumonia given the chills and cough.  EKG shows no STEMI.  Troponin is unchanged in the 70sx2 in the ED.  Creatinine unfortunately has risen to 7.7 and potassium is also slightly more elevated at 5.6.  Now he has a leukocytosis which he did not have before and hemoglobin is similar to prior.  Patient had oxygen saturations dipped into the 80s despite 4 L while in the waiting room.  I am concerned about the trajectory of his breathing and him needing BiPAP last time.  Chest x-ray also shows bilateral pleural effusions as well as some infiltrate versus atelectasis.  Had a long shared decision made conversation with patient and family and they are amenable to antibiotics for possible pneumonia development given the continued cough and chills as well as admission for the worsening respiratory symptoms with worsening kidney function and fluid overload.  Patient will be admitted for further management and will  receive antibiotics.   Final Clinical Impression(s) / ED Diagnoses Final diagnoses:  Hypervolemia, unspecified hypervolemia type  SOB (shortness of breath)  Hypoxia  AKI (acute kidney injury) ([place])  Community acquired pneumonia, unspecified laterality    Clinical Impression: 1. Hypervolemia, unspecified hypervolemia type   2. SOB (shortness of breath)   3. Hypoxia   4. AKI (acute kidney injury) ([place])   5. Community acquired pneumonia, unspecified laterality     Disposition: Admit  This note was prepared with assistance of Systems analyst. Occasional wrong-word or sound-a-like substitutions may have occurred due to the inherent limitations of voice recognition software.     Edwin Baines, Gwenyth Allegra, MD 11/12/19 1144    Awanda Wilcock, Gwenyth Allegra,  MD 11/12/19 1145

## 2019-11-12 NOTE — ED Notes (Signed)
Pt and this Tech talked with pt's daughter. Pt will wait to see MD.

## 2019-11-12 NOTE — ED Notes (Signed)
Pt wanting to leave AMA. Pt on phone with daughter.

## 2019-11-12 NOTE — ED Notes (Signed)
Pt got up to bedside commode. Pt refused to use bed pan.

## 2019-11-12 NOTE — ED Notes (Signed)
Pt family was called by registration to inform that one family member could sit with pt in lobby d/t ALONE criteria, and pt declined and stated they would come see pt when placed in room

## 2019-11-13 DIAGNOSIS — I1310 Hypertensive heart and chronic kidney disease without heart failure, with stage 1 through stage 4 chronic kidney disease, or unspecified chronic kidney disease: Secondary | ICD-10-CM

## 2019-11-13 DIAGNOSIS — N185 Chronic kidney disease, stage 5: Secondary | ICD-10-CM

## 2019-11-13 DIAGNOSIS — J189 Pneumonia, unspecified organism: Secondary | ICD-10-CM

## 2019-11-13 DIAGNOSIS — I5023 Acute on chronic systolic (congestive) heart failure: Secondary | ICD-10-CM

## 2019-11-13 DIAGNOSIS — N170 Acute kidney failure with tubular necrosis: Secondary | ICD-10-CM

## 2019-11-13 DIAGNOSIS — Z515 Encounter for palliative care: Secondary | ICD-10-CM

## 2019-11-13 DIAGNOSIS — R0602 Shortness of breath: Secondary | ICD-10-CM

## 2019-11-13 LAB — BASIC METABOLIC PANEL
Anion gap: 12 (ref 5–15)
BUN: 67 mg/dL — ABNORMAL HIGH (ref 8–23)
CO2: 18 mmol/L — ABNORMAL LOW (ref 22–32)
Calcium: 8.8 mg/dL — ABNORMAL LOW (ref 8.9–10.3)
Chloride: 109 mmol/L (ref 98–111)
Creatinine, Ser: 8.03 mg/dL — ABNORMAL HIGH (ref 0.61–1.24)
GFR calc Af Amer: 6 mL/min — ABNORMAL LOW (ref >60)
GFR calc non Af Amer: 6 mL/min — ABNORMAL LOW (ref >60)
Glucose, Bld: 92 mg/dL (ref 70–99)
Potassium: 6 mmol/L — ABNORMAL HIGH (ref 3.5–5.1)
Sodium: 139 mmol/L (ref 135–145)

## 2019-11-13 LAB — CBC
HCT: 29.6 % — ABNORMAL LOW (ref 39.0–52.0)
Hemoglobin: 9.2 g/dL — ABNORMAL LOW (ref 13.0–17.0)
MCH: 29.5 pg (ref 26.0–34.0)
MCHC: 31.1 g/dL (ref 30.0–36.0)
MCV: 94.9 fL (ref 80.0–100.0)
Platelets: 159 10*3/uL (ref 150–400)
RBC: 3.12 MIL/uL — ABNORMAL LOW (ref 4.22–5.81)
RDW: 17.7 % — ABNORMAL HIGH (ref 11.5–15.5)
WBC: 9 10*3/uL (ref 4.0–10.5)
nRBC: 0 % (ref 0.0–0.2)

## 2019-11-13 LAB — MRSA PCR SCREENING: MRSA by PCR: NEGATIVE

## 2019-11-13 MED ORDER — DEXTROSE 5 % IV SOLN
8.0000 mg | Freq: Once | INTRAVENOUS | Status: AC
  Administered 2019-11-13: 8 mg via INTRAVENOUS
  Filled 2019-11-13: qty 32

## 2019-11-13 MED ORDER — SODIUM ZIRCONIUM CYCLOSILICATE 10 G PO PACK
10.0000 g | PACK | Freq: Two times a day (BID) | ORAL | Status: DC
  Administered 2019-11-13 (×2): 10 g via ORAL
  Filled 2019-11-13 (×3): qty 1

## 2019-11-13 MED ORDER — FENTANYL CITRATE (PF) 100 MCG/2ML IJ SOLN
12.5000 ug | INTRAMUSCULAR | Status: DC | PRN
  Administered 2019-11-13 – 2019-11-14 (×4): 12.5 ug via INTRAVENOUS
  Filled 2019-11-13 (×4): qty 2

## 2019-11-13 NOTE — Progress Notes (Signed)
Internal Medicine Attending Note:  Patient is an 85 yo M with a pmhx HTN, HLD, CKD V, prior CVA, and PVD who presented to the ED with progressive DOE and an increase in his home oxygen requirement, admitted for acute on chronic HFrEF exacerbation. Patient has advanced CKD V and was deemed not to be a dialysis candidate per nephrology. He was following with outpatient hospice and made DNR; which he recently revoked. He was given IV lasix on admission with suboptimal response.   On exam this morning he was A&O x 3. Tachypneic requiring 4L [place] with crackles in all lung fields. LE edema is improved, trace to 1+ bilaterally. Abdomen soft, non distended.   In summary, this is an 85 yo M with advanced CKD V and HFrEF here with volume overload due to HFrEF exacerbation and AKI concerning for cardiorenal syndrome. Greatly appreciate palliative care's assistance - he is now DNR again. Goals of care conversation is on going, but in the meantime we will continue treating his volume overload and AKI. Will escalate diuretics and change to lasix bumex today. Continue lokelma for hyperkalemia. Continue strict I&Os, daily weights, and telemetry. We are also treating empirically for possible PNA. His leukocytosis has resolved. Consider checking procalcitonin to guide discontinuation of antibiotic therapy.    Velna Ochs, MD 11/13/2019, 2:20 PM

## 2019-11-13 NOTE — Plan of Care (Signed)
  Problem: Nutrition: Goal: Adequate nutrition will be maintained Outcome: Progressing   Problem: Safety: Goal: Ability to remain free from injury will improve Outcome: Progressing   Problem: Pain Managment: Goal: General experience of comfort will improve Outcome: Progressing   

## 2019-11-13 NOTE — Plan of Care (Signed)

## 2019-11-13 NOTE — Progress Notes (Signed)
Palliative Medicine RN Note: Rec'd a call from Verne Spurr with [facility], aka [facility] and spoke with Lenna Sciara, their liaison listed in [place].  Mr Ziomek has been under their service since 08/04/19. They will see him today and determine if this is a GIP admission.  Marjie Skiff Lourdez Mcgahan, RN, BSN, [facility] [place] Palliative Medicine Team 11/13/2019 3:21 PM Office [phone]

## 2019-11-13 NOTE — Progress Notes (Signed)
[facility] 14 - Manufacturing engineer [facility]) - Hospital Liaison Note    Patient is under [facility] services for End Stage Renal Disease per Dr. Tomasa Hosteller, [facility] physician.  Patient was transported to the ED  on 10/29/2019 for evaluation of progressive dyspnea on exertion.   Family elected to call EMS and [facility] was not notified of transport to [facility] ED.  He was admitted on 11/12/19 with Bilateral Pneumonia, Acute Respiratory Failure related to chronic Heart Failure exacerbation. This admission is related.    Visited Mr. Tignor at bedside. I was accompanied by the bedside RN as well as the chaplain. Patient was alert, had some work of breathing and decreased saturations. Was on 4L of O2 with saturations in the high 80s. The plan per daughter [name] is to meet with TOC and Palliative services tomorrow morning to discuss goals of care moving forward.      V/S:  98.2, 94, 114/79, 20 on 4L O2 via [place] with sats of 90% I&O: Unclear - not well documented in chart  Abnormal lab work: (11/13/19) RBC 3.12, Hemo 9.2, HCT 29.6, CO2 18, BUN 67, Create 8.03, Cal 8.8 Diagnostics:  Chest x-ray on 11/09/2019 noted with mild to moderate severity bibasilar atelectasis/infiltrate with small bilateral pleural effusions.   IVs: Zithromax 500g in 272ml NS at 1042 11/13/19, Bumex 8 mg in D5 32ml IVPB at 1500 on 11/13/19, Rocephin 1 g in 150ml NS IVPB at 1003 on 11/13/19 PRNs: Adm 650 mg tab Tylenol at 1149 on 11/13/19, Sublimaze inj 12.14mcg at 1603 on 11/13/19 for air hunger, Neurontin 300mg  cap at 1141 on 11/13/19 for pain   Problem list per EPIC MD Notes: Active Problems: Acute on Chronic Hypoxic Respiratory Failure: Per family, worsened SOB is similar in presentation to pt's previous hospitalization for an acute CHF exacerbation. Bacterial pneumonia cannot be ruled out and is being treated empirically with Abx -IV ceftriaxone 2g QD for 5 days (day 1) -IV azithromycin 500 mg QD for 3 days (day 1) -Wean oxygen to home  requirement (2L/min [place]) as tolerated  CKD stage V Hyperkalemia Anemia of chronic disease: Potassium 6.0 from 5.6 previously. GFR 6. Per nephrology, pt not a candidate for HD. -IV Bumex 8 mg -Avoid nephrotoxic medications  -Monitor renal function closely   End-of-life care: Per palliative care note, pt has been transitioned back to DNR after discussion with daughter. Being followed by outpatient hospice. Palliative care planning a family discussion about pt's care -Follow up palliative care    D/C planning - Unknown disposition at this time - will continue to assess discharge needs/expect patient to go home with hospice services.  Goals of Care: Ongoing discussion with Code status - DNR at home on hospice services but patient had requested Full Code status upon admission.  After discussion with PMT - patient transitioned back to DNR status. Meeting with TOC in the morning to discuss further. Communication with IDT: Honor team update/AOR notified of hospitalization. Communication with PCG: Spoke with patient and daughter [name]. [name] and her borther are meeting TOC in the morning to discuss further goals of care.  Transfer Summary and Medication List faxed.   Please call with any hospice related questions/concerns,  Clementeen Hoof, RN. [place] ([place]) [phone]

## 2019-11-13 NOTE — Consult Note (Signed)
Consultation Note Date: 11/13/2019   Patient Name: Bobby Vega  DOB: Feb 19, 1935  MRN: [id number]  Age / Sex: 85 y.o., male  PCP: Lin Landsman, MD Referring Physician: Velna Ochs, MD  Reason for Consultation: Establishing goals of care  HPI/Patient Profile: 85 y.o. male  with past medical history of CKD V not a hemodialysis candidate, PVD, s/p right BKA, systolic heart failure, aorto-femoral bypass, lumbar stenosis who was admitted on 11/09/2019 with shortness of breath.  He was found to have bilateral pneumonia as well as acute on chronic heart failure exacerbation.   Prior to admission the patient was at home on Hospice services with [facility].    Clinical Assessment and Goals of Care:  I have reviewed medical records including EPIC notes, labs and imaging, received report from Dr. Tarri Abernethy of Internal Medicine, and bedside RN Vinnie Level, examined the patient and spoke on the phone with his daughter Loree Fee  to discuss diagnosis prognosis, [place], EOL wishes, disposition and options.  The family is familiar with Palliative Medicine as specialized medical care for people living with serious illness.  From his initial Palliative consultation in February 2021 I understand that Mr. Heymann is originally from [facility].  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  [name] is his primary care taker and HCPOA.  His is a spiritual man and believes in God.  We discussed his current illness and what it means in the larger context of his on-going co-morbidities.  Natural disease trajectory and expectations at EOL were discussed.  I attempted to elicit values and goals of care important to the patient.  Advanced directives, concepts specific to code status.  Specifically I explained to Mr. Passarella and then [place] that he has end stage renal disease as well as bilateral pneumonia.  His breathing is shallow and quick and I am  concerned that he may not survive this hospitalization.    I attempted to discuss code status.  Mr. Zalesky response was "I do not want to die".  I explained that many people tell me they are afraid to die because they do not want to suffer or because they do not want to leave their family behind.  Mr. Cafaro nodded in agreement.  I talked with him about dying comfortably and not suffering.  Mr. Orona said he did want to die comfortably.   He does not want to suffer.  I spoke with [name] on the phone about resuscitation and explained the medical recommendation is for DNR as resuscitation would likely only do harm at the very end of his life.  [name] agrees.  She wants him to be comfortable and not to suffer. She commented that his wishes are confusing because he wants to live but did not want to come back to the hospital for treatment.   She agreed with changing his code status to DNR.  We also discussed not continuing to ask Mr. Sanon about code status as he forgets the previous conversation and has to go thru the same upsetting thought process all  over again.  [name] stated that she and her two brothers care for Mr. Warmoth together and she would like for me to talk with them as well.  We set a time of 8:30 am on Friday morning to have a conference call together with her brothers.   I feel it is important for them to know that their father's time is very short and his breathing is very poor.  [name] will relay the message to them tonight and we will talk together in the morning on conference call.  Questions and concerns were addressed.  The family was encouraged to call with questions or concerns.    Primary Decision Maker:  NEXT OF KIN Chandra    SUMMARY OF RECOMMENDATIONS    Code Status/Advance Care Planning:  Change to DNR.   Symptom Management:   Will add low dose fentanyl prn for dyspnea  Palliative Prophylaxis:   Frequent assessment for pain and dyspnea   Psycho-social/Spiritual:   Desire for further Chaplaincy support: requested.  Prognosis: days to weeks.      Discharge Planning: To Be Determined.   Concerning for possible hospital death vs Hospice facility.      Primary Diagnoses: Present on Admission: . Acute respiratory failure with hypoxia ([place])   I have reviewed the medical record, interviewed the patient and family, and examined the patient. The following aspects are pertinent.  Past Medical History:  Diagnosis Date  . Arthritis    hip  . Borderline diabetes   . BPH (benign prostatic hyperplasia)    takes Avodart daily  . Bronchitis    yrs ago  . Cervical spondylosis with myelopathy   . Cervical spondylosis without myelopathy   . Chronic back pain   . CKD (chronic kidney disease), stage III   . Constipation    Metamucil prn  . Diverticulosis   . ED (erectile dysfunction)    takes Cialis as needed as well as Viagra  . Embolism - blood clot    in stomach  . GERD (gastroesophageal reflux disease)    takes Nexium daily  . Hyperlipidemia    takes Zocor nightly  . Hypertension    takes Losartan daily  . Lumbar spondylosis with myelopathy   . Nocturia   . Pain in joint, hand   . Peripheral vascular disease ([place])   . Stroke ([place])   . Urinary urgency    Social History   Socioeconomic History  . Marital status: Widowed    Spouse name: Not on file  . Number of children: Not on file  . Years of education: Not on file  . Highest education level: Not on file  Occupational History  . Not on file  Tobacco Use  . Smoking status: Current Some Day Smoker    Years: 0.50    Types: Cigars    Last attempt to quit: 04/29/1992    Years since quitting: 27.5  . Smokeless tobacco: Never Used  Substance and Sexual Activity  . Alcohol use: No  . Drug use: No    Types: Marijuana    Comment: Hx  . Sexual activity: Not on file  Other Topics Concern  . Not on file  Social History Narrative  . Not on file   Social  Determinants of Health   Financial Resource Strain:   . Difficulty of Paying Living Expenses:   Food Insecurity:   . Worried About Charity fundraiser in the Last Year:   . [place] in the  Last Year:   Transportation Needs:   . Film/video editor (Medical):   Marland Kitchen Lack of Transportation (Non-Medical):   Physical Activity:   . Days of Exercise per Week:   . Minutes of Exercise per Session:   Stress:   . Feeling of Stress :   Social Connections:   . Frequency of Communication with Friends and Family:   . Frequency of Social Gatherings with Friends and Family:   . Attends Religious Services:   . Active Member of Clubs or Organizations:   . Attends Archivist Meetings:   Marland Kitchen Marital Status:    Family History  Problem Relation Age of Onset  . Heart disease Mother        Before age 64  . Heart disease Brother        NOT  before age 3  . Heart attack Brother   . Anesthesia problems Neg Hx   . Hypotension Neg Hx   . Malignant hyperthermia Neg Hx   . Pseudochol deficiency Neg Hx     No Known Allergies   Vital Signs: BP 114/79 (BP Location: Right Arm)   Pulse 94   Temp 98.2 F (36.8 C) (Axillary)   Resp 20   Ht 5\' 6"  (1.676 m)   Wt 66.9 kg   SpO2 90%   BMI 23.81 kg/m  Pain Scale: 0-10   Pain Score: 10-Worst pain ever   SpO2: SpO2: 90 % O2 Device:SpO2: 90 % O2 Flow Rate: .O2 Flow Rate (L/min): 4 L/min    Palliative Assessment/Data: 20%     Time In: 11:00  Time Out: 12:00 Time Total: 60 min. Visit consisted of counseling and education dealing with the complex and emotionally intense issues surrounding the need for palliative care and symptom management in the setting of serious and potentially life-threatening illness. Greater than 50%  of this time was spent counseling and coordinating care related to the above assessment and plan.  Signed by: Florentina Jenny, PA-C Palliative Medicine  Please contact Palliative Medicine Team phone at  [phone] for questions and concerns.  For individual provider: See Shea Evans

## 2019-11-13 NOTE — Progress Notes (Signed)
   Subjective: Pt is an 85-yr-old man who presented to the hospital with a 3 day history of progressive dyspnea on exertion. Pt is minimally communicative. Reports SOB. Nurse was called to assist pt with using the bathroom.  Objective:  Vital signs in last 24 hours: Vitals:   11/12/19 2330 11/13/19 0350 11/13/19 0727 11/13/19 0814  BP: 119/64 130/72 (!) 141/78 140/83  Pulse: 85 92 96 92  Resp: 16 (!) 22 20 18   Temp: 98 F (36.7 C) 98.2 F (36.8 C) (!) 97.5 F (36.4 C)   TempSrc: Oral Oral Oral   SpO2: 96% 93% 92% 94%  Weight:      Height:       General: Pt is thin and ill-appearing HEENT: Normocephalic and atraumatic Cardiac: RRR, no murmurs, rubs, or gallops Respiratory: Pt is on oxygen Abdomen: Abdomen is soft and non-tender Extremities: 1+ pitting LE edema bilaterally Neuro: Pt is minimally communicative but alert Skin: warm and dry  Labs:  BMP Latest Ref Rng & Units 11/13/2019 11/06/2019 08/03/2019  Glucose 70 - 99 mg/dL 92 109(H) 85  BUN 8 - 23 mg/dL 67(H) 61(H) 44(H)  Creatinine 0.61 - 1.24 mg/dL 8.03(H) 7.78(H) 6.14(H)  Sodium 135 - 145 mmol/L 139 139 142  Potassium 3.5 - 5.1 mmol/L 6.0(H) 5.6(H) 4.6  Chloride 98 - 111 mmol/L 109 109 105  CO2 22 - 32 mmol/L 18(L) 17(L) 23  Calcium 8.9 - 10.3 mg/dL 8.8(L) 9.1 8.7(L)    Assessment/Plan:  Active Problems:   Acute respiratory failure with hypoxia (HCC)  Bobby Vega is an 85 year old man with a PMH of hypertension, CKD stage V, HFrEF, CVA, and PVD who presented to the ED with a 3 day history of progressive dyspnea on exertion and acute on chronic hypoxic respiratory failure. Pt has volume overload and AKI, concerning for cardiorenal syndrome.  Acute on Chronic Hypoxic Respiratory Failure: Per family, worsened SOB is similar in presentation to pt's previous hospitalization for an acute CHF exacerbation. On CXR from 11/17/2019, small bilateral pleural effusions and mild to moderate atelectasis and/or infiltrates are  visualized. Bacterial pneumonia cannot be ruled out and is being treated empirically with Abx -IV ceftriaxone 2g QD for 5 days (day 1) -IV azithromycin 500 mg QD for 3 days (day 1) -Wean oxygen to home requirement (2L/min [place]) as tolerated  CKD stage V Hyperkalemia Anemia of chronic disease: Potassium 6.0 from 5.6 previously. GFR 6. Per nephrology, pt not a candidate for HD. -IV Bumex 8 mg -Avoid nephrotoxic medications  -Monitor renal function closely   End-of-life care: Per palliative care note, pt has been transitioned back to DNR after discussion with daughter. Being followed by outpatient hospice. Palliative care planning a family discussion about pt's care -Follow up palliative care   HTN: -Amlodipine 10 mg QD  DVT prophylaxis:  -IV heparin 5000 units SQ  CODE STATUS: DNR  Prior to Admission Living Arrangement: Home Anticipated Discharge Location: Home Barriers to Discharge: None Dispo: Anticipated discharge in greater than 2 midnights  Bobby Vega, Medical Student 11/13/2019, 10:37 AM Pager: [phone]

## 2019-11-13 NOTE — Progress Notes (Signed)
   11/13/19 1629  Clinical Encounter Type  Visited With Patient  Visit Type Initial;Spiritual support  Referral From Palliative care team  Consult/Referral To Chaplain  This chaplain responded to PMT consult for spiritual care.  The RN-Suzanne updated the chaplain on the Pt. The Pt. welcomed the chaplain bedside and shared his love for his daughter-Chandra before falling asleep.  The chaplain will F/U and attempt to revisit the Pt. fear of dying on Friday.

## 2019-11-14 DIAGNOSIS — Z515 Encounter for palliative care: Secondary | ICD-10-CM

## 2019-11-14 DIAGNOSIS — I132 Hypertensive heart and chronic kidney disease with heart failure and with stage 5 chronic kidney disease, or end stage renal disease: Principal | ICD-10-CM

## 2019-11-14 DIAGNOSIS — J9601 Acute respiratory failure with hypoxia: Secondary | ICD-10-CM

## 2019-11-14 LAB — BASIC METABOLIC PANEL
Anion gap: 12 (ref 5–15)
BUN: 71 mg/dL — ABNORMAL HIGH (ref 8–23)
CO2: 20 mmol/L — ABNORMAL LOW (ref 22–32)
Calcium: 8.6 mg/dL — ABNORMAL LOW (ref 8.9–10.3)
Chloride: 106 mmol/L (ref 98–111)
Creatinine, Ser: 7.8 mg/dL — ABNORMAL HIGH (ref 0.61–1.24)
GFR calc Af Amer: 7 mL/min — ABNORMAL LOW (ref >60)
GFR calc non Af Amer: 6 mL/min — ABNORMAL LOW (ref >60)
Glucose, Bld: 106 mg/dL — ABNORMAL HIGH (ref 70–99)
Potassium: 5.8 mmol/L — ABNORMAL HIGH (ref 3.5–5.1)
Sodium: 138 mmol/L (ref 135–145)

## 2019-11-14 LAB — PROCALCITONIN: Procalcitonin: 1.56 ng/mL

## 2019-11-14 MED ORDER — HALOPERIDOL LACTATE 5 MG/ML IJ SOLN: 0.5000 mg | INTRAMUSCULAR | Status: DC | PRN

## 2019-11-14 MED ORDER — ACETAMINOPHEN 500 MG PO TABS
1000.0000 mg | ORAL_TABLET | Freq: Three times a day (TID) | ORAL | Status: DC
  Administered 2019-11-14 – 2019-11-15 (×4): 1000 mg via ORAL
  Filled 2019-11-14 (×6): qty 2

## 2019-11-14 MED ORDER — FENTANYL 2500MCG IN NS 250ML (10MCG/ML) PREMIX INFUSION
75.0000 ug/h | INTRAVENOUS | Status: DC
  Administered 2019-11-14: 15 ug/h via INTRAVENOUS
  Filled 2019-11-14: qty 250

## 2019-11-14 MED ORDER — HALOPERIDOL LACTATE 2 MG/ML PO CONC
0.5000 mg | ORAL | Status: DC | PRN
  Filled 2019-11-14: qty 0.3

## 2019-11-14 MED ORDER — FENTANYL 12 MCG/HR TD PT72
1.0000 | MEDICATED_PATCH | TRANSDERMAL | Status: DC
  Administered 2019-11-14: 1 via TRANSDERMAL
  Filled 2019-11-14: qty 1

## 2019-11-14 MED ORDER — FENTANYL CITRATE (PF) 100 MCG/2ML IJ SOLN
12.5000 ug | INTRAMUSCULAR | Status: DC | PRN
  Administered 2019-11-14: 50 ug via INTRAVENOUS
  Administered 2019-11-14: 12.5 ug via INTRAVENOUS
  Filled 2019-11-14 (×3): qty 2

## 2019-11-14 MED ORDER — LORAZEPAM 2 MG/ML IJ SOLN
0.2500 mg | Freq: Every day | INTRAMUSCULAR | Status: DC
  Filled 2019-11-14: qty 1

## 2019-11-14 MED ORDER — HALOPERIDOL 0.5 MG PO TABS
0.5000 mg | ORAL_TABLET | ORAL | Status: DC | PRN
  Filled 2019-11-14: qty 1

## 2019-11-14 MED ORDER — GLYCOPYRROLATE 0.2 MG/ML IJ SOLN
0.2000 mg | INTRAMUSCULAR | Status: DC | PRN
  Administered 2019-11-16: 0.2 mg via INTRAVENOUS
  Filled 2019-11-14: qty 1

## 2019-11-14 MED ORDER — DEXTROSE 5 % IV SOLN
8.0000 mg | Freq: Once | INTRAVENOUS | Status: AC
  Administered 2019-11-14: 8 mg via INTRAVENOUS
  Filled 2019-11-14: qty 30

## 2019-11-14 MED ORDER — FENTANYL BOLUS VIA INFUSION
25.0000 ug | INTRAVENOUS | Status: DC | PRN
  Administered 2019-11-15: 25 ug via INTRAVENOUS
  Filled 2019-11-14: qty 50

## 2019-11-14 MED ORDER — IPRATROPIUM-ALBUTEROL 0.5-2.5 (3) MG/3ML IN SOLN: 3.0000 mL | RESPIRATORY_TRACT | Status: DC

## 2019-11-14 MED ORDER — LORAZEPAM 2 MG/ML IJ SOLN: 0.5000 mg | INTRAMUSCULAR | Status: DC | PRN

## 2019-11-14 MED ORDER — DEXTROSE 5 % IV SOLN
8.0000 mg | Freq: Once | INTRAVENOUS | Status: AC
  Administered 2019-11-14: 8 mg via INTRAVENOUS
  Filled 2019-11-14: qty 32

## 2019-11-14 MED ORDER — GLYCOPYRROLATE 1 MG PO TABS
1.0000 mg | ORAL_TABLET | ORAL | Status: DC | PRN
  Filled 2019-11-14: qty 1

## 2019-11-14 MED ORDER — POLYVINYL ALCOHOL 1.4 % OP SOLN
1.0000 [drp] | Freq: Four times a day (QID) | OPHTHALMIC | Status: DC | PRN
  Filled 2019-11-14: qty 15

## 2019-11-14 MED ORDER — LORAZEPAM 2 MG/ML PO CONC: 1.0000 mg | ORAL | Status: DC | PRN

## 2019-11-14 MED ORDER — BIOTENE DRY MOUTH MT LIQD: 15.0000 mL | OROMUCOSAL | Status: DC | PRN

## 2019-11-14 MED ORDER — GLYCOPYRROLATE 0.2 MG/ML IJ SOLN: 0.2000 mg | INTRAMUSCULAR | Status: DC | PRN

## 2019-11-14 NOTE — Progress Notes (Signed)
Pt noted to have O2 sats in high 70s to low 80s on 5L nasal cannula, not improving. Pt placed on nonrebreather and O2 sats came back up to 90s. Respiratory called and asked to give albuterol treatment. MD paged and notified.

## 2019-11-14 NOTE — Progress Notes (Signed)
RN and NT transported patient to 6N room 19. All belongings sent with patient. Glasses and ring placed in clear plastic bag with patient sticker. RN placed bag in compartment on bedside table and notified the patient and RN. Pt belongs left on sink.

## 2019-11-14 NOTE — Progress Notes (Signed)
RT called to bedside by Nurse to assess patient and to administer an Albuterol tx. Pt sats dropping in the 70's. After Albuterol tx, RT placed pt on NRB 10L with a [place] 6L. Pt saturations are at 95% and nurse will continue to monitor pt.

## 2019-11-14 NOTE — Progress Notes (Addendum)
Subjective:  O/N: Increased O2 demand in 70s. Placed on non rebreather 10L with nasal cannula 6L. Additional 8 Bumex given.  When asked how he was this morning, he responded with "I'm still here." Breathing is somewhat better than yesterday, but he did decompensate last night. [place] discussion today with family and palliative.   Objective:  Vital signs in last 24 hours: Vitals:   11/14/19 0347 11/14/19 0400 11/14/19 0406 11/14/19 0500  BP:  120/76  137/72  Pulse: 94 92 94 97  Resp: (!) 23 15 18 20   Temp:  (!) 97.5 F (36.4 C)    TempSrc:  Axillary    SpO2: (!) 81% 98% 95% 94%  Weight:      Height:       Physical Exam Constitutional:      Appearance: He is ill-appearing.     Comments: Ill in appearance, resting comfortably in bed. Answers questions appropriately.   Cardiovascular:     Rate and Rhythm: Normal rate and regular rhythm.     Heart sounds: No murmur. No friction rub. No gallop.   Pulmonary:     Effort: Pulmonary effort is normal.     Breath sounds: Normal breath sounds. No decreased breath sounds, wheezing, rhonchi or rales.     Comments: Slight increased work of breathing noted, lungs sound clear to auscultation bilaterally, sounds better than yesterday.  Abdominal:     General: Bowel sounds are normal.     Palpations: Abdomen is soft.  Musculoskeletal:     Right lower leg: No tenderness.     Left lower leg: No tenderness.     Labs:  BMP Latest Ref Rng & Units 11/13/2019 11/15/2019 08/03/2019  Glucose 70 - 99 mg/dL 92 109(H) 85  BUN 8 - 23 mg/dL 67(H) 61(H) 44(H)  Creatinine 0.61 - 1.24 mg/dL 8.03(H) 7.78(H) 6.14(H)  Sodium 135 - 145 mmol/L 139 139 142  Potassium 3.5 - 5.1 mmol/L 6.0(H) 5.6(H) 4.6  Chloride 98 - 111 mmol/L 109 109 105  CO2 22 - 32 mmol/L 18(L) 17(L) 23  Calcium 8.9 - 10.3 mg/dL 8.8(L) 9.1 8.7(L)   I/O last 3 completed shifts: In: 689.4 [P.O.:240; IV Piggyback:449.4] Out: 1975 [Urine:1975]  Assessment/Plan:  Active Problems:   Acute  respiratory failure with hypoxia ([place])   Community acquired pneumonia   Cardiorenal syndrome   SOB (shortness of breath)   Palliative care encounter  Bobby Vega is an 85 year old man with a PMH of hypertension, CKD stage V, HFrEF, CVA, and PVD who presented to the ED with a 3 day history of progressive dyspnea on exertion and acute on chronic hypoxic respiratory failure. Pt has volume overload and AKI, concerning for cardiorenal syndrome.  Acute on Chronic Hypoxic Respiratory Failure:  Per family, worsened SOB is similar in presentation to pt's previous hospitalization for an acute CHF exacerbation. On CXR from 11/12/2019, small bilateral pleural effusions and mild to moderate atelectasis and/or infiltrates are visualized. Bacterial pneumonia cannot be ruled out and is being treated empirically with Abx. [name] lungs sound much better today than yesterday, leaning more towards CHF exacerbation, while he did desaturate last night, he did have dilaudid before his event which may have been the underlying cause of desaturation. Will check procal to r/o respiratory infection/pneumonia - procalcitonin 1.56, will continue antibiotic course.  -IV ceftriaxone 2g QD for 5 days (day 2) -IV azithromycin 500 mg QD for 3 days (day 2) -Wean oxygen to home requirement (2L/min [place]) as tolerated - Continue Duoneb  CKD stage V Hyperkalemia Anemia of chronic disease:  Additional Bumex given O/N. Potassium level coming down to 5.8 today from 6.0 yesterday, with improvement of Cr. Continue trend BMP for kidney function.  -Trend Cr: 7.80<8.03 -IV Bumex 8 mg - I/O -1175 mL  End-of-life care:  Per palliative care note, pt has been transitioned back to DNR after discussion with daughter. Hospice saw patient  note stating that further TOC  today.  - We appreciate palliative and hospice's care and recommendations.  - Palliative reached out, patient now comfort care with duonebs and antibiotics    HTN: -Amlodipine 10 mg QD  DVT prophylaxis:  -IV heparin 5000 units SQ  CODE STATUS: DNR  Prior to Admission Living Arrangement: Home Anticipated Discharge Location: Home Barriers to Discharge: None Dispo: Likely Discharge in the next 1-2 days.   Maudie Mercury, MD 11/14/2019, 6:55 AM [phone]

## 2019-11-14 NOTE — Progress Notes (Signed)
Daily Progress Note   Patient Name: Bobby Vega       Date: 11/14/2019 DOB: 05-Jul-1934  Age: 85 y.o. MRN#: [id number] Attending Physician: Velna Ochs, MD Primary Care Physician: Lin Landsman, MD Admit Date: 11/10/2019  Reason for Consultation/Follow-up: To discuss complex medical decision making related to patient's goals of care  Patient had a difficult night overnight.  Oxygen saturations dropping into the 70s.  He required nonrebreather mask.   Subjective: I examined the patient at bedside this morning.  He commented that he "is barely making it ".  I asked if he was in pain and he stated "I hurt all over ".  He indicated that the pain medicine given helped when he received it.  I tried to provide reassurance and told him his family would visit later today.  Bobby Picker, NP and I held a conference call with his 3 children, Bobby Vega, Bobby Vega.  We began the conversation by discussing his current status.  Fortunately his 3 children have a good understanding of his current health condition from his prior hospitalization, visits to his PCP and the primary attending team discussions.  We talked about yesterday's shift to DNR.  The family was supportive of this change.  Then we discussed how to care for Bobby Vega going forward.  I presented the option of shifting to "comfort care "in which we would continue his antibiotics and nebulizers but not escalate his care in place more emphasis on controlling his pain and dyspnea.  Specifically we discussed using fentanyl to control his dyspnea and pain as well as low-dose antianxiety medication.  We explained to his children that he may not survive this hospitalization.  We encouraged them to visit and explained the unrestricted visitation policy.   His 3 children understand his current condition and are in agreement with changing the goal of care to comfort.  They do not want him to suffer at end-of-life.  Assessment: 85 year old gentleman with end-stage kidney disease, advanced heart failure, and pneumonia.  Approaching end-of-life.    At[place] Profile/HPI:  85 y.o. male  with past medical history of CKD V not a hemodialysis candidate, PVD, s/p right BKA, systolic heart failure, aorto-femoral bypass, lumbar stenosis who was admitted on 11/20/2019 with shortness of breath.  He was found to have bilateral pneumonia as well as acute  on chronic heart failure exacerbation.   Length of Stay: 2   Vital Signs: BP 112/67 (BP Location: Right Arm)   Pulse 92   Temp 97.7 F (36.5 C) (Axillary)   Resp 20   Ht 5\' 6"  (1.676 m)   Wt 66.9 kg   SpO2 90%   BMI 23.81 kg/m  SpO2: SpO2: 90 % O2 Device: O2 Device: Nasal Cannula O2 Flow Rate: O2 Flow Rate (L/min): 6 L/min       Palliative Assessment/Data: 20%     Palliative Care Plan    Recommendations/Plan:  Goal of care is now comfort.  Will continue antibiotic and nebulizer therapy.  Orders adjusted.  Will discontinue any interventions not focused on comfort (aside from antibiotics).  Have added low-dose fentanyl patch, low-dose antianxiety scheduled as well as as needed doses of comfort medications.  Discussed with bedside RN.  Will not escalate care.  Plan for transfer to 6 N.  Unrestricted visitation status.  3 people at bedside able to rotate out.  No restrictions on visiting hours.  Code Status:  DNR  Prognosis:   Hours - Days   Discharge Planning:  Hospital death would not be surprising.  We are still maintaining hope for improvement with possible discharge to hospice facility  Care plan was discussed with family, bedside RN, attending team.  Thank you for allowing the Palliative Medicine Team to assist in the care of this patient.  Total time spent: 45 minutes       Greater than 50%  of this time was spent counseling and coordinating care related to the above assessment and plan.  Bobby Jenny, PA-C Palliative Medicine  Please contact Palliative MedicineTeam phone at [phone] for questions and concerns between 7 am - 7 pm.   Please see AMION for individual provider pager numbers.

## 2019-11-14 NOTE — Progress Notes (Addendum)
[facility]m [facility] 2C 14 - Manufacturing engineer [facility]) - Hospital Liaison Note  Patient is under [facility] services for End Stage Renal Disease per Dr. Tomasa Hosteller, [facility] physician. Patient was transported to the ED on 11/20/2019 for evaluation of progressive dyspnea on exertion.  Family elected to call EMS and [facility] was not notified of transport to [facility] ED. He was admitted on 11/12/19 with Bilateral Pneumonia, Acute Respiratory Failure related to chronic Heart Failure exacerbation. This admission is related.   Palliative met today to have [place] discussion with family. Family is in agreement with Comfort care only. Patient is responsive and sometimes disoriented. Patient had a difficult night overnight.  Oxygen saturations dropping into the 70s.  He required nonrebreather mask. Patient has stated " I hurt all over" but pain medication helps. Patient seems to be tolerating POs well.   VS: 97.7, 112/67, 94, 20, 90% on 6LNC I/O: 689.4/485.6 Abnormal Labs: K+ 5.8, CO2 20, BUN 71, Creatinine 7.8, Calcium 8.6, RBC 3.12, Hgb 9.2, HCT 29.6, RDW 17.7  IV/PRN medications: Rocephin 2g IVPB QD, fentanyl 12.41mg IV Q2hr @ 0317, Fentanyl  12.534m IV Q1586msevere pain 1407, gabapentin 300m32m '@0907'   Problem list: Acute on Chronic Hypoxic Respiratory Failure:  -- procalcitonin 1.56, will continue antibiotic course.  -IV ceftriaxone 2g QD for 5 days (day 2) -IV azithromycin 500 mg QD for 3 days (day 2) CKD stage V Hyperkalemia Anemia of chronic disease:  --IV Bumex 8 mg - I/O -1175 mL End-of-life care:  -Patient now comfort care.  D/C planning - To be determined. May 2024/05/10ome a hospital death [facility]. [facility]. Communication with IDT: Updated. Communication with PCG: Spoke with daughter by phone today.   If ambulance transport is needed, [facility]   Please call with any hospice  related questions/concerns,  MeliClementeen Hoof. BSN [phone]

## 2019-11-14 NOTE — Progress Notes (Signed)
   11/14/19 1415  Clinical Encounter Type  Visited With Patient and family together  Visit Type Follow-up;Spiritual support  Referral From Palliative care team  Consult/Referral To Chaplain  The chaplain responded to PMT consult for spiritual care.  The Pt. introduced me to his children-Chandra and Rasmus bedside.  The Pt. identifies his children as giving meaning to his life.  A bit of humor/spirit is seen in the Pt. by the chaplain as the Pt. is negotiating some vanilla ice cream with his RN. A spiritual care invitation is shared with the Pt. children. Visitor guidelines and chaplain availability is discussed. Directions to the cafeteria and Philippa Chester are shared.  F./U spiritual care is available as needed.

## 2019-11-14 NOTE — Progress Notes (Signed)
Paged for increase supplemental oxygen requirement. Patient was on 5 L [place] when sat's dropped to high 70's. RN notified RT and patient given albuterol treatment and put on non-rebreather 10L with nasal cannula.6L. Satting 97% on my exam. Patient has not responded to lasix and given bumex today. RN reports emptying foley bag recently, 500 ml urine output.    Gen: Patient appears chronically ill.  Pulm: No rales on exam,  Good air movement. CV: RRR, no murmurs  Sinus rhythm on tele with PVC's. Pulmonary edema on admission xray  Here with acute on chronic HFrEF. Increase oxygen requirement likely due to pulmonary congestion. Patient recently on hospice care. Changed code status to DNR yesterday. I will not call PCCM to evaluate as patient would not like aggressive measures like intubation. Patient responding to Bumex given today at 1745, will give additional dose now. Scheduled duonebs.

## 2019-11-14 NOTE — Plan of Care (Signed)
  Problem: Respiratory: Goal: Verbalizations of increased ease of respirations will increase Outcome: Progressing   Problem: Role Relationship: Goal: Family's ability to cope with current situation will improve Outcome: Progressing Goal: Ability to verbalize concerns, feelings, and thoughts to partner or family member will improve Outcome: Progressing   Problem: Pain Management: Goal: Satisfaction with pain management regimen will improve Outcome: Progressing   

## 2019-11-15 DIAGNOSIS — N179 Acute kidney failure, unspecified: Secondary | ICD-10-CM

## 2019-11-15 DIAGNOSIS — E8770 Fluid overload, unspecified: Secondary | ICD-10-CM

## 2019-11-15 MED ORDER — LORAZEPAM 2 MG/ML IJ SOLN
0.5000 mg | Freq: Three times a day (TID) | INTRAMUSCULAR | Status: DC
  Administered 2019-11-15 – 2019-11-16 (×2): 0.5 mg via INTRAVENOUS
  Filled 2019-11-15 (×2): qty 1

## 2019-11-15 MED ORDER — FENTANYL BOLUS VIA INFUSION
50.0000 ug | INTRAVENOUS | Status: DC | PRN
  Filled 2019-11-15: qty 100

## 2019-11-15 MED ORDER — FUROSEMIDE 10 MG/ML IJ SOLN
40.0000 mg | Freq: Once | INTRAMUSCULAR | Status: AC
  Administered 2019-11-15: 40 mg via INTRAVENOUS
  Filled 2019-11-15: qty 4

## 2019-11-15 MED ORDER — LORAZEPAM 2 MG/ML IJ SOLN: 0.5000 mg | Freq: Two times a day (BID) | INTRAMUSCULAR | Status: DC

## 2019-11-15 NOTE — Progress Notes (Signed)
Pt slept at long intervals this shift but easily arousable. Has denied pain and has pt has carried on conversations when awake, no resp distress noted. Will continue to monitor

## 2019-11-15 NOTE — Progress Notes (Signed)
Rm [facility] 6N19 - [facility] ([facility]) -Hospital Liaison Note  Patient is under [facility] services for End Stage Renal Disease per Dr. Hertweck, [facility] physician. Patient was transported to the ED on 10/28/2019 for evaluation of progressive dyspnea on exertion. Family elected to call EMS and [facility] was not notified of transport to [facility] ED. He was admitted on 11/12/19 with Bilateral Pneumonia, Acute Respiratory Failure related to chronic Heart Failure exacerbation. This admission is related.   Palliative met yesterday to have GOC discussion with family. Family is in agreement with Comfort care only. Visited patient at bedside and he was resting comfortably. When asked how he was feeling he stated " I am still here". No acute events overnight. Still on 3.5L O2 and on continuous fentanyl drip.   VS: 97.6, 131/61, 83 HR, RR 16, O2 91 [place] ;3.5L I&O:120/? Labs:no new labs since yesterday  IV: Continuous-   fentaNYL 2500mcg in NS 250mL (10mcg/ml) infusion-PREMIX 15-50 mcg/hr, Intravenous, 1.5-5 mL/hr, Continuous  Scheduled: Ativan 0.25mg IV Rocephin 2g 200ml/hr every 24 hours. PRN   fentaNYL (SUBLIMAZE) bolus via infusion 25-50 mcg 25-50 mcg, Intravenous, Every 15 min PRN   11/14/19 1845 --  11/14/19 0918   LORazepam (ATIVAN) 2 MG/ML concentrated solution 1 mg 1 mg, Sublingual, Every 4 hours PRN  "Or" Linked Group Details  11/14/19 0916 -- 11/14/19 0918   LORazepam (ATIVAN) injection 0.5 mg 0.5 mg, Intravenous, Every 4 hours PRN  "Or" Linked Group Details  11/14/19 0916 -- 11/14/19 0903   haloperidol (HALDOL) tablet 0.5 mg 0.5 mg, Oral, Every 4 hours PRN  "Or" Linked Group Details     Active Problems:   Acute respiratory failure with hypoxia (HCC)   Community acquired pneumonia   Cardiorenal syndrome   SOB (shortness of breath)   Palliative care encounter   Comfort measures only status  Bobby Vega is an 85-year-old man with a PMH of hypertension, CKD  stage V, HFrEF, CVA, and PVD who presented to the ED with a 3 day history of progressive dyspnea on exertionand acute on chronic hypoxic respiratory failure. Pt has volume overload and AKI, concerning for cardiorenal syndrome.  Acute on chronic hypoxic respiratory failure: -likely multifactorial in the setting of volume overload and possible CAP -he has not responded well to diuresis given his end-stage renal failure, and is not an HD candidate. He is approaching end of life  -greatly appreciate palliative care consult; after discussions with patient and family, they have elected to pursue comfort care but would prefer to complete course of abx -he is on day 3/5 for CAP -continue other medications for comfort   D/C planning-To be determined. May become hospital death or possible transfer to [facility]. GOC: DNR, comfort measures IDT: updated Communication with [facility]: Spoke with MD and bedside RN, unable to reach daughter on phone but she was headed to hospital per RN.   If ambulance transport is needed, please use [facility] for [facility] patients.   Please call with any hospice related questions/concerns.  Melissa O'Bryant, RN, BSN Hospital liaison [facility] ([facility]) [phone] 

## 2019-11-15 NOTE — Progress Notes (Signed)
Daily Progress Note   Patient Name: Bobby Vega       Date: 11/15/2019 DOB: 01-Jul-1934  Age: 85 y.o. MRN#: [id number] Attending Physician: Velna Ochs, MD Primary Care Physician: Lin Landsman, MD Admit Date: 10/28/2019  Reason for Consultation/Follow-up: Symptom management, comfort care   Subjective: Patient with obvious increased work of breathing (increased from yesterday).  I ask how he is and he shakes his head.  I ask if his breathing is the worst thing, and he nods his head.  Son Gildo and [place] are at bedside.  Jamille asked if the antibiotics were helping - I expressed a concern that they are not and at this point they are likely adding unwanted volume which would contribute to difficulty breathing.   I talked with him about discontinuing the antibiotics.  Lazer understands his father is dying.  He wants him to be comfortable.  We discussed increasing his pain medication in an attempt to ease his breathing.   Assessment: Patient actively dying now more symptomatic with increased work of breathing.   Patient Profile/HPI:  85 y.o.malewith past medical history of CKD V not a hemodialysis candidate, PVD, s/p right BKA, systolic heart failure, aorto-femoral bypass, lumbar stenosiswho was admitted on 5/18/2021with shortness of breath. He was found to have bilateral pneumonia as well as acute on chronic heart failure exacerbation.   Length of Stay: 3   Vital Signs: BP 131/61 (BP Location: Left Arm)   Pulse 83   Temp 97.6 F (36.4 C) (Oral)   Resp 16   Ht 5\' 6"  (1.676 m)   Wt 66.9 kg   SpO2 91%   BMI 23.81 kg/m  SpO2: SpO2: 91 % O2 Device: O2 Device: Nasal Cannula O2 Flow Rate: O2 Flow Rate (L/min): 3.5 L/min       Palliative Assessment/Data:  10%     Palliative Care Plan    Recommendations/Plan:  Cancel diet.  Provide compassion cart for family.  Bolus fentanyl and increase gtt rate due to respiratory distress and dyspnea  Scheduled ativan due to distress.  Will give IV lasix x 1 to see if it gives him any relief.  Would not hesitate to increase fentanyl gtt rate if he remains dyspneic and uncomfortable.  Code Status:  DNR  Prognosis:   Hours - Days   Discharge Planning:  Anticipated Hospital Death  Care plan was discussed with family, pharmacy and primary team.  Thank you for allowing the Palliative Medicine Team to assist in the care of this patient.  Total time spent:  35 min.     Greater than 50%  of this time was spent counseling and coordinating care related to the above assessment and plan.  Florentina Jenny, PA-C Palliative Medicine  Please contact Palliative MedicineTeam phone at [phone] for questions and concerns between 7 am - 7 pm.   Please see AMION for individual provider pager numbers.

## 2019-11-15 NOTE — Progress Notes (Signed)
   Subjective: No acute events overnight. Bobby Vega was resting comfortably upon entering the room. When asked how he was feeling, he states he's "just tired and hurts everywhere." No shortness of breath. Assisted with getting enough sugar in his coffee and encouraged him to eat or drink whatever he felt like.   Objective:  Vital signs in last 24 hours: Vitals:   11/14/19 0700 11/14/19 0735 11/14/19 1707 11/15/19 0547  BP: 112/67  (!) 143/62 131/61  Pulse: 93 92 (!) 44 83  Resp: 20 20 14 16   Temp: 97.7 F (36.5 C)  97.8 F (36.6 C) 97.6 F (36.4 C)  TempSrc: Axillary  Oral Oral  SpO2: 95% 90% (!) 86% 91%  Weight:      Height:       General: ill-appearing man, resting comfortably CV: RRR Pulm: no increased work of breathing; diffuse rhonchi   Assessment/Plan:  Active Problems:   Acute respiratory failure with hypoxia (HCC)   Community acquired pneumonia   Cardiorenal syndrome   SOB (shortness of breath)   Palliative care encounter   Comfort measures only status  Bobby Vega is an 85 year old man with a PMH of hypertension, CKD stage V, HFrEF, CVA, and PVD who presented to the ED with a 3 day history of progressive dyspnea on exertionand acute on chronic hypoxic respiratory failure. Pt has volume overload and AKI, concerning for cardiorenal syndrome.  Acute on chronic hypoxic respiratory failure: -likely multifactorial in the setting of volume overload and possible CAP -he has not responded well to diuresis given his end-stage renal failure, and is not an HD candidate. He is approaching end of life  -greatly appreciate palliative care consult; after discussions with patient and family, they have elected to pursue comfort care but would prefer to complete course of abx -he is on day 3/5 for CAP -continue other medications for comfort   Prior to Admission Living Arrangement: home Anticipated Discharge Location: anticipated hospital death vs hospice  Barriers to  Discharge: none   Delice Bison, DO 11/15/2019, 6:49 AM

## 2019-11-15 NOTE — Progress Notes (Signed)
Internal Medicine Attending:   I saw and examined the patient. I reviewed Dr Janne Napoleon note and I agree with the resident's findings and plan as documented in the resident's note.

## 2019-11-16 MED ORDER — GLYCOPYRROLATE 0.2 MG/ML IJ SOLN
0.4000 mg | INTRAMUSCULAR | Status: DC
  Administered 2019-11-16: 0.4 mg via INTRAVENOUS
  Filled 2019-11-16: qty 2

## 2019-11-16 MED ORDER — SODIUM CHLORIDE 0.9 % IV SOLN
2.0000 g | INTRAVENOUS | Status: AC
  Administered 2019-11-16: 2 g via INTRAVENOUS
  Filled 2019-11-16: qty 2

## 2019-11-17 LAB — CULTURE, BLOOD (ROUTINE X 2): Culture: NO GROWTH

## 2019-11-25 NOTE — Progress Notes (Signed)
This RN wasted 19 ML remaining of fentanyl in the stericylce, witnessed by [place] Ropes, Therapist, sports

## 2019-11-25 NOTE — Progress Notes (Signed)
   Subjective:  O/N Events: None   Patient seen at bedside this morning. Patient appears comfortable at bedside this morning. Minimally responsive at bedside.   Objective:  Vital signs in last 24 hours: Vitals:   11/14/19 0735 11/14/19 1707 11/15/19 0547 2019/11/19 0509  BP:  (!) 143/62 131/61 122/63  Pulse: 92 (!) 44 83 79  Resp: 20 14 16  (!) 8  Temp:  97.8 F (36.6 C) 97.6 F (36.4 C) 98.3 F (36.8 C)  TempSrc:  Oral Oral Oral  SpO2: 90% (!) 86% 91% 92%  Weight:      Height:       Physical Exam Constitutional:      Appearance: He is ill-appearing.     Comments: Chronically ill appearing, able to open and close eyes. Non responsive to verbal questioning.   Cardiovascular:     Rate and Rhythm: Normal rate and regular rhythm.     Heart sounds: No murmur. No friction rub. No gallop.   Pulmonary:     Effort: Bradypnea present.     Breath sounds: Normal breath sounds. No decreased breath sounds, wheezing, rhonchi or rales.  Abdominal:     General: Bowel sounds are normal.     Palpations: Abdomen is soft.      Assessment/Plan:  Active Problems:   Acute respiratory failure with hypoxia (HCC)   Community acquired pneumonia   Cardiorenal syndrome   SOB (shortness of breath)   Palliative care encounter   Comfort measures only status  Trason Shifflet is an 85 year old man with a PMH of hypertension, CKD stage V, HFrEF, CVA, and PVD who presented to the ED with a 3 day history of progressive dyspnea on exertionand acute on chronic hypoxic respiratory failure. Pt has volume overload and AKI, concerning for cardiorenal syndrome.  Acute on chronic hypoxic respiratory failure: - likely multifactorial in the setting of volume overload and possible CAP - Unresponsivel to diuresis given his end-stage renal failure. He not an HD candidate. He is approaching end of life.  - greatly appreciate palliative care consult; after discussions with patient and family, they have elected to  pursue comfort care but would prefer to complete course of abx - Completed Zithromax for CAP coverage - Will complete Rocephin today for CAP coverage  - continue other medications for comfort   End-of-life care: Patient is full comfort care with antibiotics. He is actively dying.  - We appreciate Palliative and Hospice's recommendations.  Prior to Admission Living Arrangement: home Anticipated Discharge Location: anticipated hospital death vs hospice  Barriers to Discharge: none   Maudie Mercury, MD Nov 19, 2019, 9:46 AM

## 2019-11-25 NOTE — Death Summary Note (Signed)
  Name: Bobby Vega MRN: [id number] DOB: 06-Mar-1935 85 y.o.  Date of Admission: 11/07/2019 10:48 PM Date of Discharge: 11-19-19 Attending Physician: Velna Ochs, MD  Discharge Diagnosis: Active Problems:   Acute respiratory failure with hypoxia ([place])   Community acquired pneumonia   Cardiorenal syndrome   SOB (shortness of breath)   Palliative care encounter   Comfort measures only status Acute On Chronic Hypoxic Respiratory Failure  Cause of death: Respiratory Failure  Time of death: 10:34 a.m.  Disposition and follow-up:   Mr.[name] was discharged from [facility] in expired condition.    Hospital Course: Acute on Chronic Hypoxic Respiratory Failure:  Mr. [name] arrived to the emergency department from home hospice with hypoxic respiratory failure, requiring 4L of oxygen from his home supplementation of 2L.His imaging was concerning for atelectasis with small bilateral pleural effusions with a luekocytosis. He was started on diuretics for his hypervolemia but due to his chronic kidney disease, he was minimally responsive to therapy. He was also started on ceftriaxone and azithromycin for pneumonia. Due to not responding to diuresis, and not being a dialysis candidate, he was made comfort care. He completed his antibiotic course for pneumonia, but ultimately passed of respiratory failure secondary to volume overload.   Signed: Maudie Mercury, MD 11/19/2019, 10:59 AM

## 2019-11-25 NOTE — Progress Notes (Signed)
Upon walking into room, this RN assessed Pt, there was no apical pulse or breathe sounds auscultated. Time of death was pronounced at 10:34 am by this RN and Charge Nurse Remo Lipps, RN. See

## 2019-11-25 NOTE — Progress Notes (Signed)
                                                                                                                                                                                                           Daily Progress Note   Patient Name: Bobby Vega       Date: 2019-12-10 DOB: 07-Jul-1934  Age: 85 y.o. MRN#: [id number] Attending Physician: Velna Ochs, MD Primary Care Physician: Lin Landsman, MD Admit Date: 11/14/2019  Reason for Consultation/Follow-up: Symptom management, comfort care  Subjective: Evaluated Naasir at bedside this morning. He appeared more comfortable as compared report from  prior night. Noted by nursing staff to have increase in secretions overnight.   Assessment: Patient actively dying extremities cool this morning, breathing pattern is less regulated.   Patient Profile/HPI:  85 y.o.malewith past medical history of CKD V not a hemodialysis candidate, PVD, s/p right BKA, systolic heart failure, aorto-femoral bypass, lumbar stenosiswho was admitted on 5/18/2021with shortness of breath. He was found to have bilateral pneumonia as well as acute on chronic heart failure exacerbation.  Length of Stay: 4  Vital Signs: BP 122/63   Pulse 79   Temp 98.3 F (36.8 C) (Oral)   Resp (!) 8   Ht 5\' 6"  (1.676 m)   Wt 66.9 kg   SpO2 92%   BMI 23.81 kg/m  SpO2: SpO2: 92 % O2 Device: O2 Device: Nasal Cannula O2 Flow Rate: O2 Flow Rate (L/min): 3.5 L/min      Palliative Assessment/Data: 10%  Palliative Care Plan   Recommendations/Plan:  Bolus fentanyl and increase gtt rate due to respiratory distress and dyspnea  Scheduled ativan due to distress.  Schedule robinul for secretion management  Would not hesitate to increase fentanyl gtt rate if he remains dyspneic and uncomfortable.  Code Status:  DNR  Prognosis:   Hours - Days   Discharge Planning:  Anticipated Hospital Death  Care plan was discussed with family, pharmacy and primary team.   Thank you for allowing the Palliative Medicine Team to assist in the care of this patient.  Total time spent:  15 min.  reater than 50%  of this time was spent counseling and coordinating care related to the above assessment and plan.  Tacey Ruiz, [facility] Palliative Medicine  Please contact Palliative MedicineTeam phone at [phone] for questions and concerns between 7 am - 7 pm.   Please see AMION for individual provider pager numbers.

## 2019-11-25 DEATH — deceased

## 2021-08-01 IMAGING — US US RENAL
1 series · 14 of 25 positions shown · non-contrast
Comparison: March 22, 2017.

CLINICAL DATA: Acute kidney injury.

EXAM:
RENAL / URINARY TRACT ULTRASOUND COMPLETE

[Series 1: us renal · 14 of 46 slices shown]
[im 1/46]
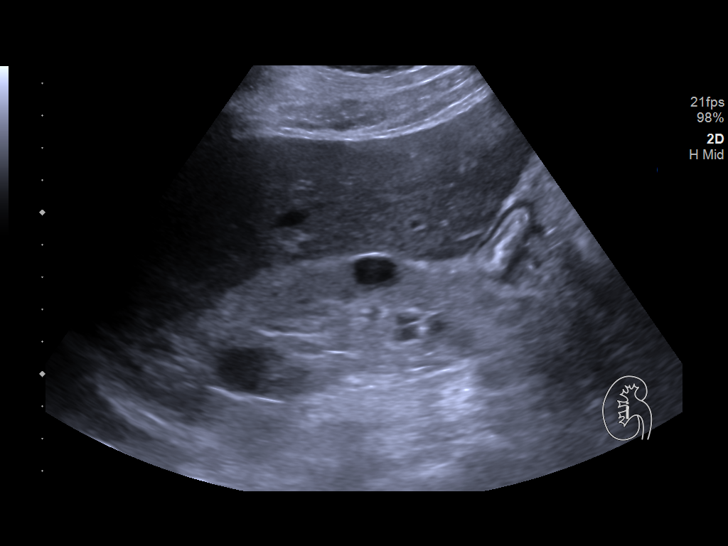
[im 4/46]
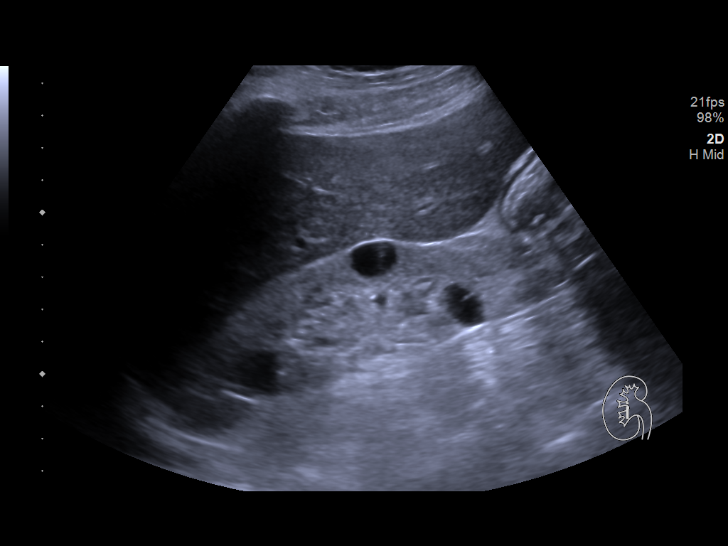
[im 8/46]
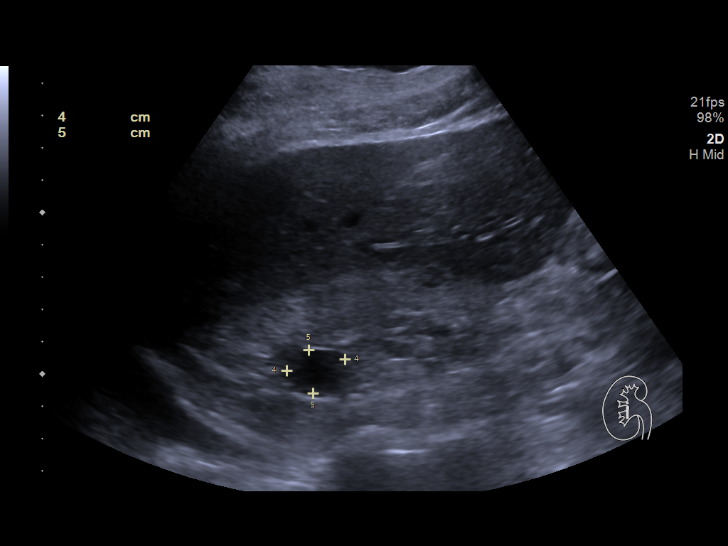
[im 12/46]
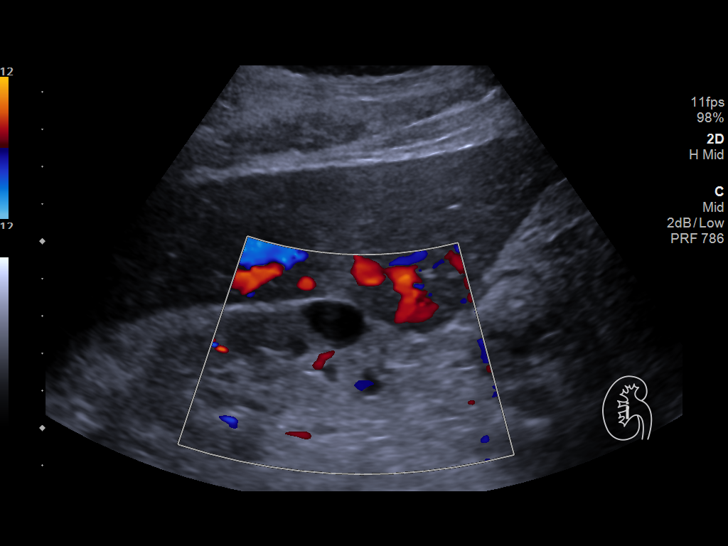
[im 16/46]
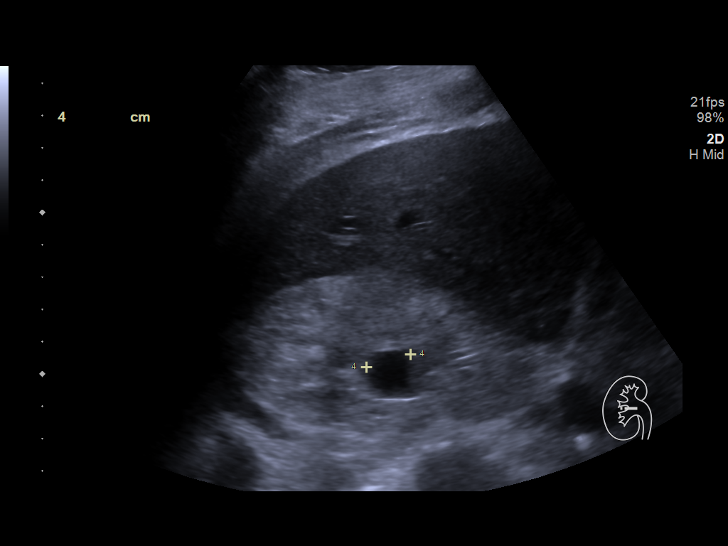
[im 17/46]
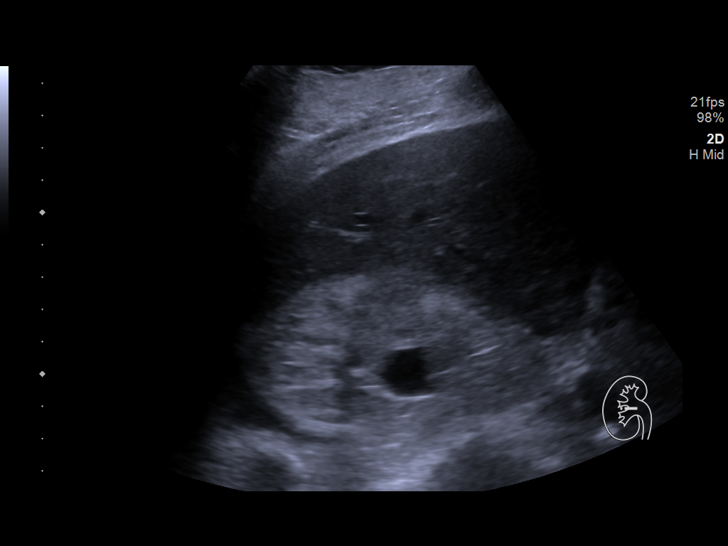
[im 21/46]
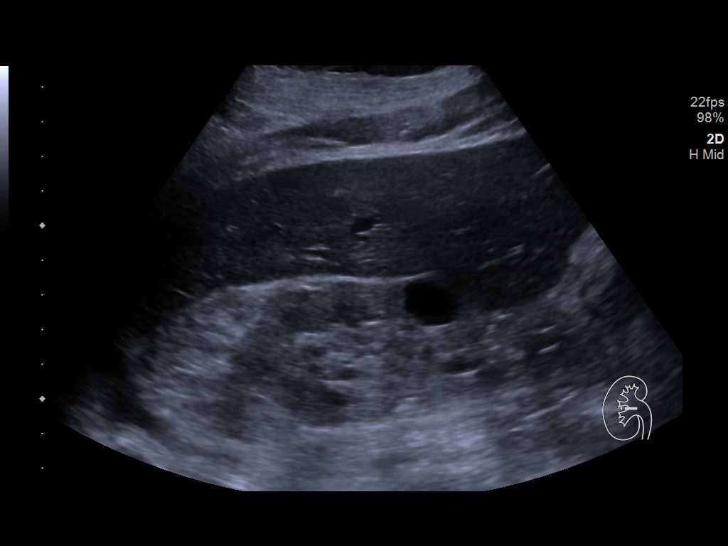
[im 25/46]
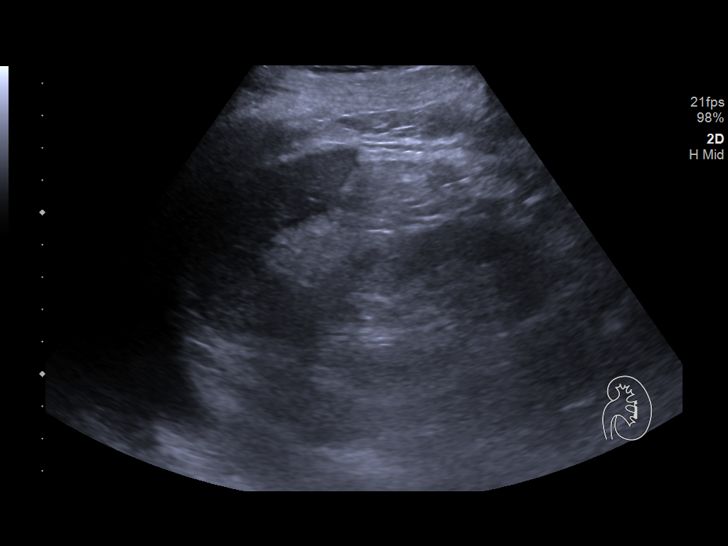
[im 29/46]
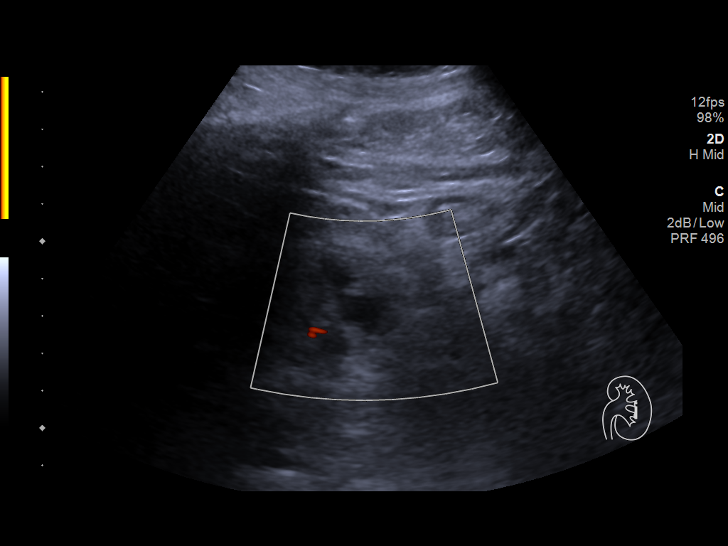
[im 31/46]
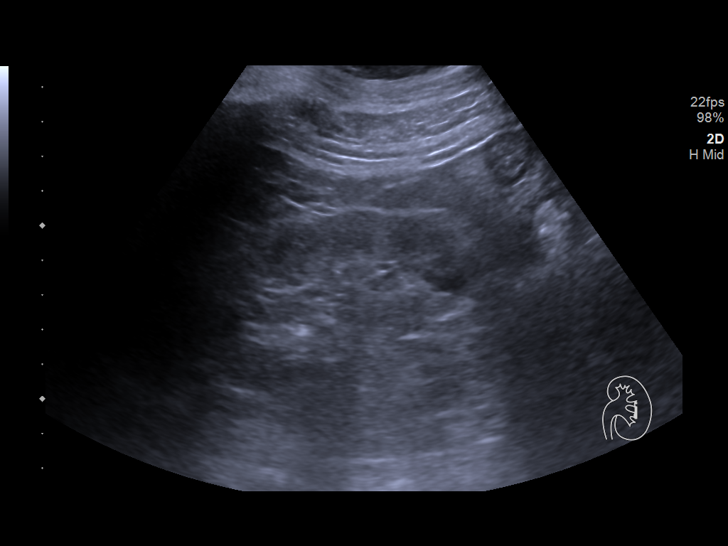
[im 34/46]
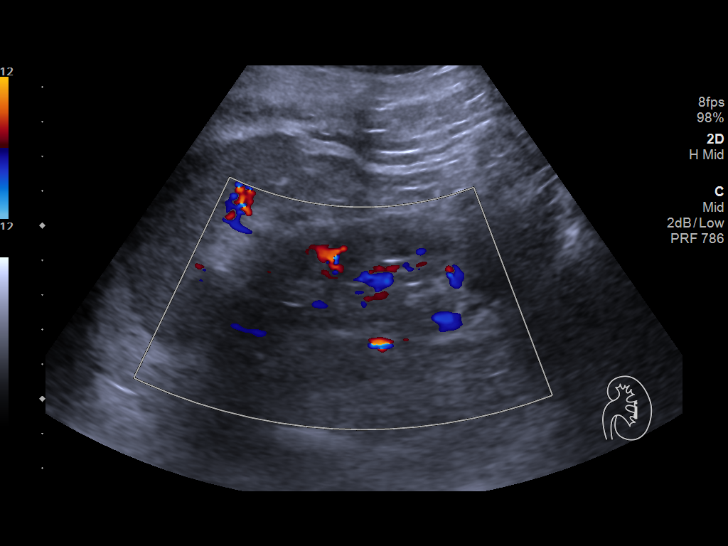
[im 38/46]
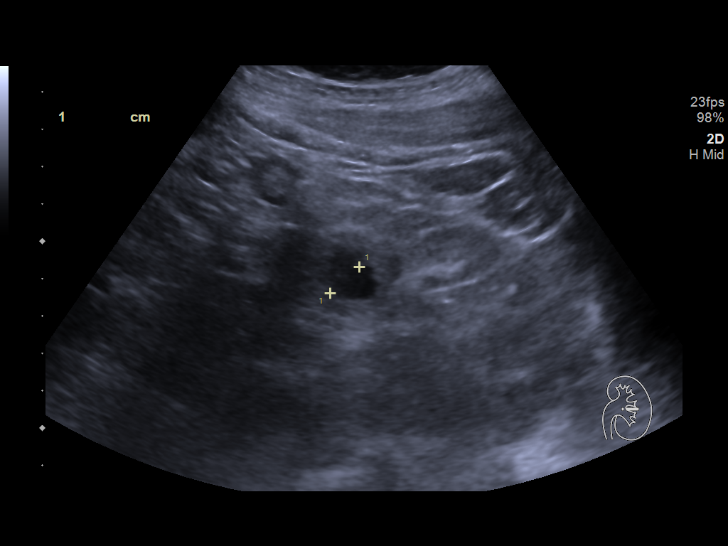
[im 42/46]
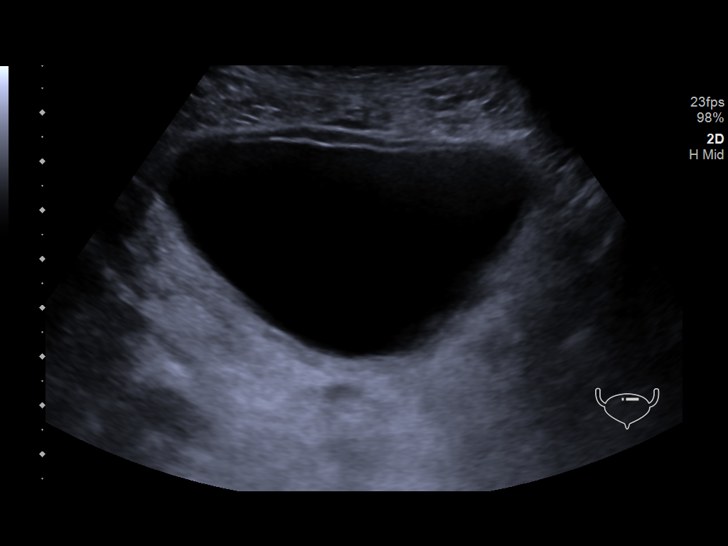
[im 46/46]
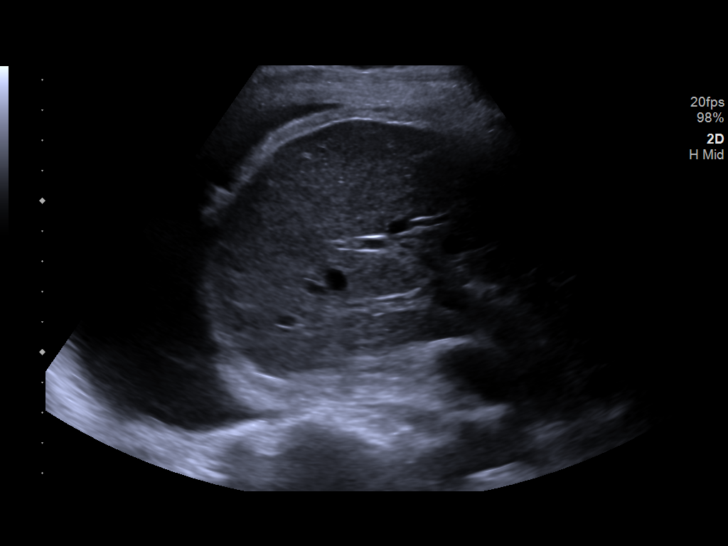

[14 of 25 positions shown; findings below may reference images not displayed]

FINDINGS: Right Kidney:

Renal measurements: 10.7 x 5.3 x 4.2 cm = volume: 123 mL. Increased
echogenicity of renal parenchyma is noted. Two simple cysts are
noted, the largest measuring 1.8 cm. No mass or hydronephrosis
visualized.

Left Kidney:

Renal measurements: 8.2 x 4.5 x 4.0 cm = volume: 76 mL. Increased
echogenicity of renal parenchyma is noted. 1.2 cm simple cyst is
noted. No mass or hydronephrosis visualized.

Bladder:

Appears normal for degree of bladder distention.

Other:

None.
IMPRESSION: Increased echogenicity of renal parenchyma is noted bilaterally
suggesting medical renal disease. Small bilateral renal cysts are
noted. No hydronephrosis or renal obstruction is noted.

## 2021-11-28 IMAGING — DX DG CHEST 2V
2 series · 2 of 2 positions shown · non-contrast
Comparison: July 31, 2019

CLINICAL DATA: Shortness of breath.

EXAM:
CHEST - 2 VIEW

[chest lat]
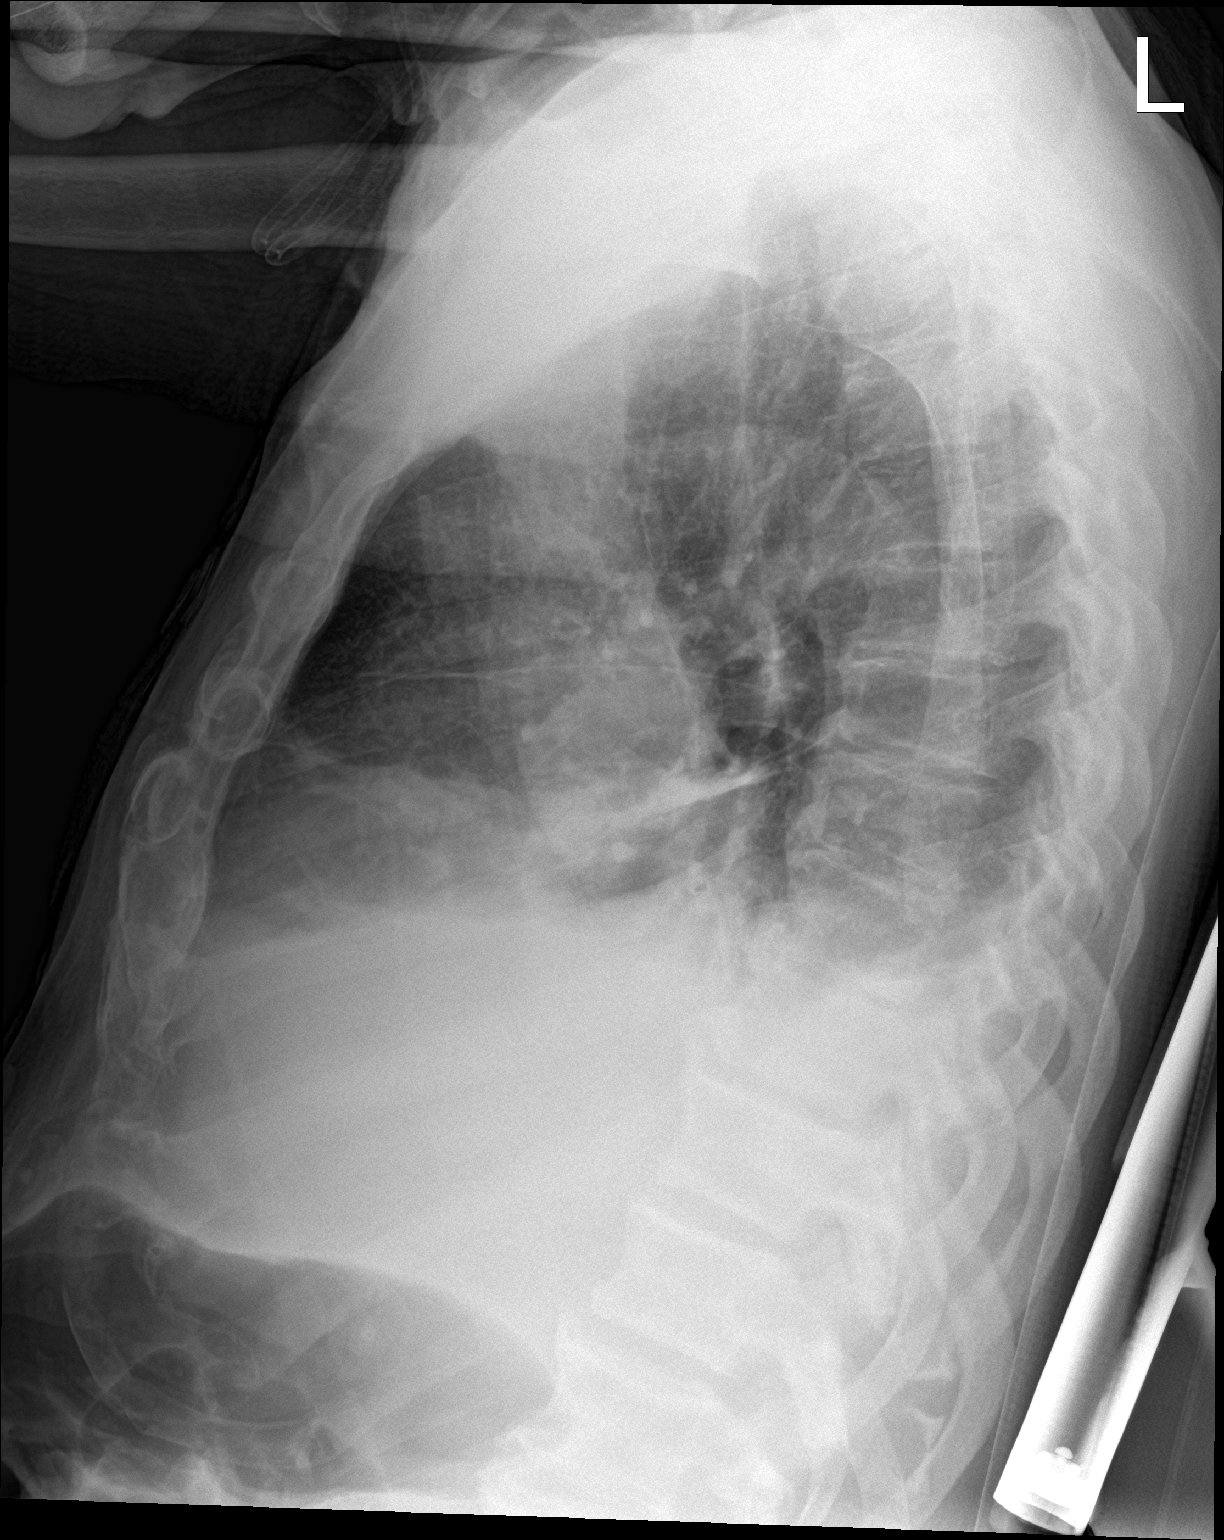

[chest ap]
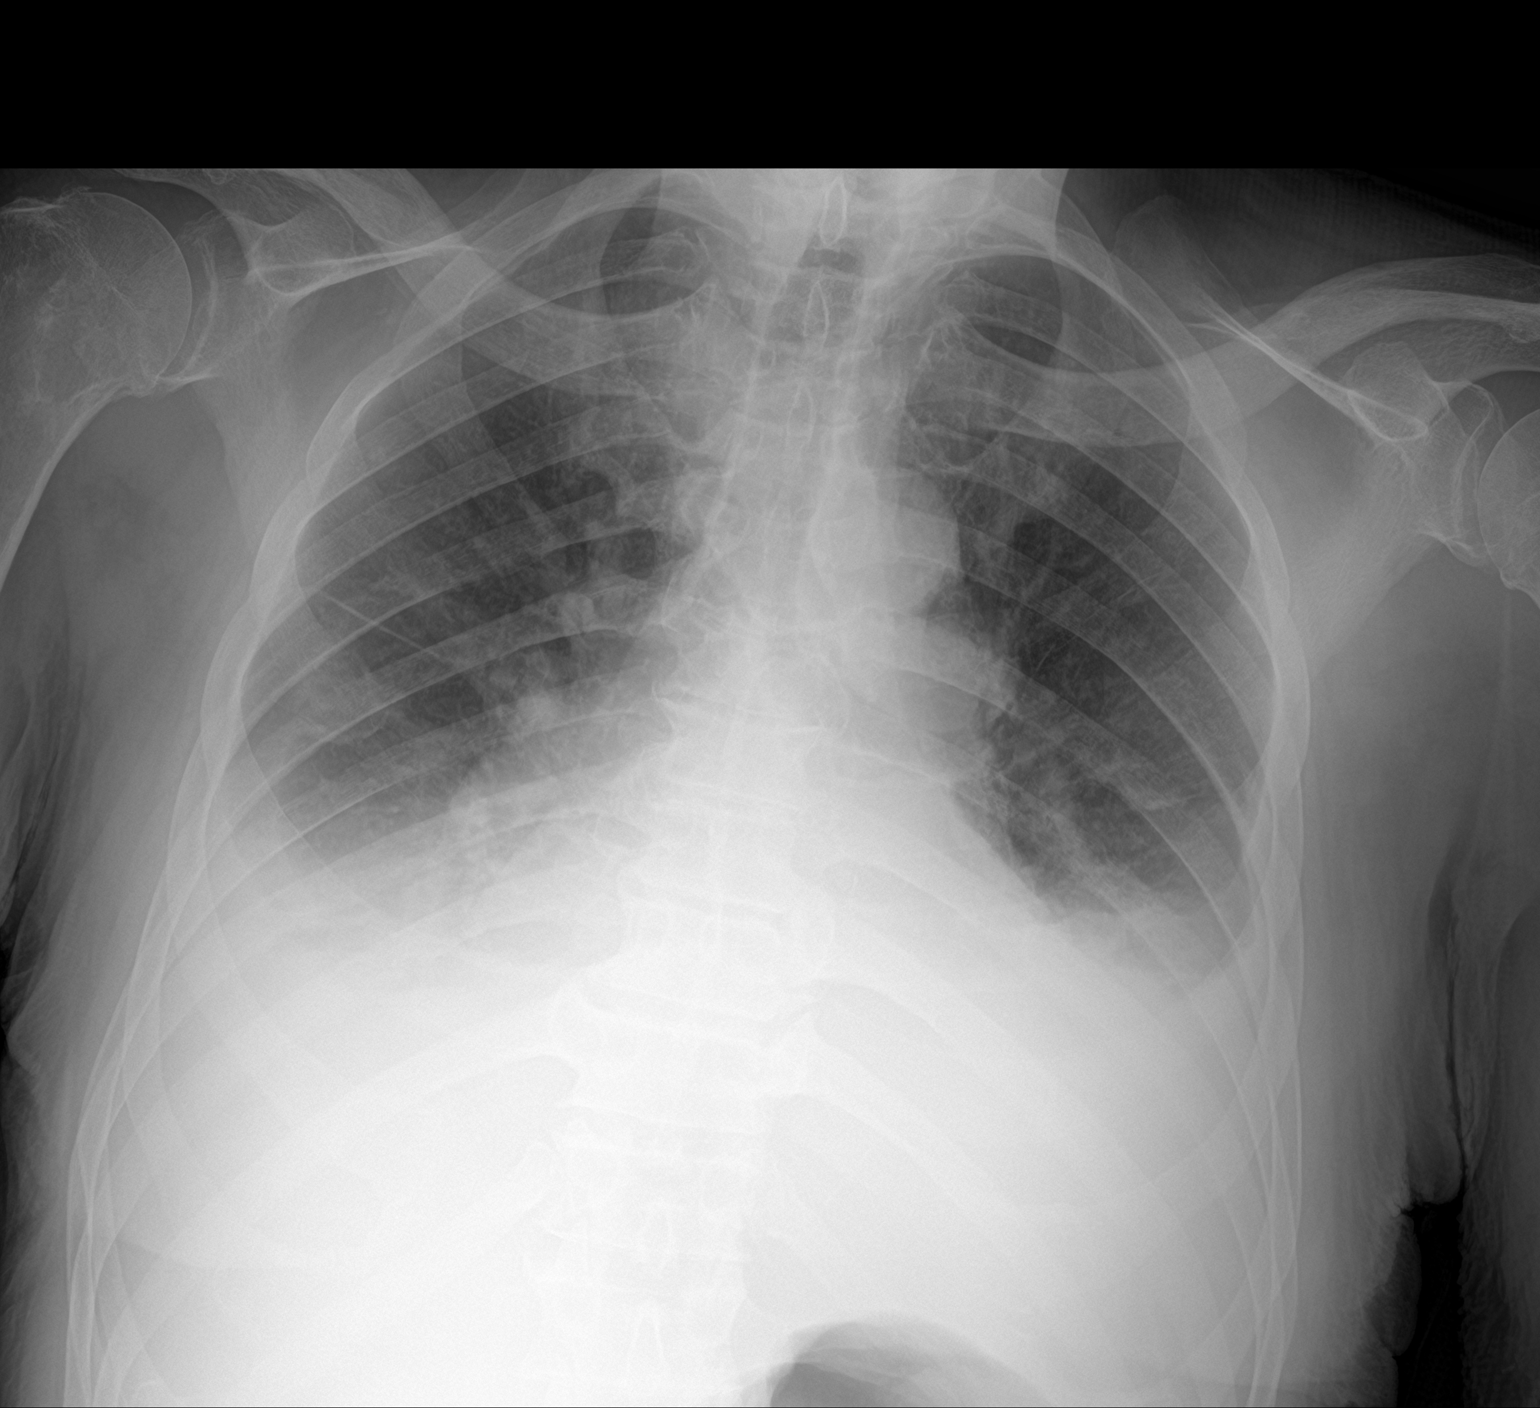

[2 of 2 positions shown; findings below may reference images not displayed]

FINDINGS: Mild to moderate severity areas of atelectasis and/or infiltrate are
seen within the bilateral lung bases. Small bilateral pleural
effusions are seen. There is no evidence of a pneumothorax. The
cardiac silhouette is mildly enlarged. Degenerative changes seen
throughout the thoracic spine.
IMPRESSION: 1. Mild to moderate severity bibasilar atelectasis and/or
infiltrate.
2. Small bilateral pleural effusions.
